# Patient Record
Sex: Male | Born: 1940 | Race: White | Hispanic: No | Marital: Married | Smoking: Former smoker
Health system: Southern US, Community
[De-identification: ages and names within clinical notes are randomized; demographics above are authoritative.]

## PROBLEM LIST (undated history)

## (undated) DIAGNOSIS — J189 Pneumonia, unspecified organism: Secondary | ICD-10-CM

## (undated) DIAGNOSIS — C801 Malignant (primary) neoplasm, unspecified: Secondary | ICD-10-CM

## (undated) DIAGNOSIS — Z5111 Encounter for antineoplastic chemotherapy: Secondary | ICD-10-CM

## (undated) DIAGNOSIS — Z8619 Personal history of other infectious and parasitic diseases: Secondary | ICD-10-CM

## (undated) DIAGNOSIS — G4700 Insomnia, unspecified: Secondary | ICD-10-CM

## (undated) DIAGNOSIS — C4490 Unspecified malignant neoplasm of skin, unspecified: Secondary | ICD-10-CM

## (undated) DIAGNOSIS — D696 Thrombocytopenia, unspecified: Secondary | ICD-10-CM

## (undated) DIAGNOSIS — R0602 Shortness of breath: Secondary | ICD-10-CM

## (undated) DIAGNOSIS — R350 Frequency of micturition: Secondary | ICD-10-CM

## (undated) DIAGNOSIS — D649 Anemia, unspecified: Secondary | ICD-10-CM

## (undated) DIAGNOSIS — E7800 Pure hypercholesterolemia, unspecified: Secondary | ICD-10-CM

## (undated) DIAGNOSIS — C3490 Malignant neoplasm of unspecified part of unspecified bronchus or lung: Secondary | ICD-10-CM

## (undated) DIAGNOSIS — R51 Headache: Secondary | ICD-10-CM

## (undated) DIAGNOSIS — K219 Gastro-esophageal reflux disease without esophagitis: Secondary | ICD-10-CM

## (undated) DIAGNOSIS — M2550 Pain in unspecified joint: Secondary | ICD-10-CM

## (undated) DIAGNOSIS — N400 Enlarged prostate without lower urinary tract symptoms: Secondary | ICD-10-CM

## (undated) HISTORY — PX: TONSILLECTOMY: SUR1361

## (undated) HISTORY — PX: COLONOSCOPY W/ BIOPSIES AND POLYPECTOMY: SHX1376

## (undated) HISTORY — PX: OTHER SURGICAL HISTORY: SHX169

## (undated) HISTORY — PX: ADENOIDECTOMY: SUR15

## (undated) HISTORY — DX: Insomnia, unspecified: G47.00

## (undated) HISTORY — DX: Unspecified malignant neoplasm of skin, unspecified: C44.90

## (undated) HISTORY — DX: Malignant neoplasm of unspecified part of unspecified bronchus or lung: C34.90

## (undated) HISTORY — PX: TUMOR EXCISION: SHX421

---

## 1963-03-29 DIAGNOSIS — J189 Pneumonia, unspecified organism: Secondary | ICD-10-CM

## 1963-03-29 HISTORY — DX: Pneumonia, unspecified organism: J18.9

## 2000-10-13 ENCOUNTER — Encounter: Payer: Self-pay | Admitting: Internal Medicine

## 2000-10-13 ENCOUNTER — Ambulatory Visit (HOSPITAL_COMMUNITY): Admission: RE | Admit: 2000-10-13 | Discharge: 2000-10-13 | Payer: Self-pay | Admitting: Internal Medicine

## 2001-05-14 ENCOUNTER — Emergency Department (HOSPITAL_COMMUNITY): Admission: EM | Admit: 2001-05-14 | Discharge: 2001-05-14 | Payer: Self-pay | Admitting: *Deleted

## 2001-05-14 ENCOUNTER — Encounter: Payer: Self-pay | Admitting: *Deleted

## 2010-07-14 ENCOUNTER — Telehealth: Payer: Self-pay

## 2010-07-14 DIAGNOSIS — Z139 Encounter for screening, unspecified: Secondary | ICD-10-CM

## 2010-07-14 NOTE — Telephone Encounter (Signed)
Gastroenterology Pre-Procedure Form  Request Date: 07/14/2010    Requesting Physician: Renaye Rakers     PATIENT INFORMATION:  Terry Robles is a 69 y.o., male (DOB=[birth year]).  PROCEDURE: Procedure(s) requested: colonoscopy Procedure Reason: screening for colon cancer  PATIENT REVIEW QUESTIONS: The patient reports the following:   1. Diabetes Melitis: no 2. Joint replacements in the past 12 months: no 3. Major health problems in the past 3 months: no 4. Has an artificial valve or MVP:no 5. Has been advised in past to take antibiotics in advance of a procedure like teeth cleaning: no}    MEDICATIONS & ALLERGIES:    Patient reports the following regarding taking any blood thinners:   Plavix? no Aspirin?Yes Coumadin?  no  Patient confirms/reports the following medications:  Current Outpatient Prescriptions  Medication Sig Dispense Refill  . nitroGLYCERIN (NITROSTAT) 0.4 MG SL tablet Place 0.4 mg under the tongue every 5 (five) minutes as needed. Pt said he has not had to use it yet       . pravastatin (PRAVACHOL) 40 MG tablet Take 40 mg by mouth daily.          Patient confirms/reports the following allergies:  No Known Allergies  Patient is appropriate to schedule for requested procedure(s): yes  AUTHORIZATION INFORMATION Primary Insurance: Medicare  ID #: [id number]       No orders of the defined types were placed in this encounter.    SCHEDULE INFORMATION: Procedure has been scheduled as follows:  Date: 08/03/2010   Time: 8:30 AM  Location: [facility] SHORT STAY  This Gastroenterology Pre-Precedure Form is being routed to the following provider(s) for review: Lorenza Burton

## 2010-07-19 NOTE — Telephone Encounter (Signed)
Ok for colonoscopy.  ?

## 2010-07-23 ENCOUNTER — Encounter: Payer: Self-pay | Admitting: Internal Medicine

## 2010-08-03 ENCOUNTER — Ambulatory Visit (HOSPITAL_COMMUNITY)
Admission: RE | Admit: 2010-08-03 | Discharge: 2010-08-03 | Disposition: A | Discharge status: Home / Self Care | Payer: Medicare Other | Source: Ambulatory Visit | Attending: Internal Medicine | Admitting: Internal Medicine

## 2010-08-03 ENCOUNTER — Other Ambulatory Visit: Payer: Self-pay | Admitting: Internal Medicine

## 2010-08-03 ENCOUNTER — Encounter: Payer: Self-pay | Admitting: Internal Medicine

## 2010-08-03 DIAGNOSIS — D126 Benign neoplasm of colon, unspecified: Secondary | ICD-10-CM | POA: Insufficient documentation

## 2010-08-03 DIAGNOSIS — K5730 Diverticulosis of large intestine without perforation or abscess without bleeding: Secondary | ICD-10-CM | POA: Insufficient documentation

## 2010-08-03 DIAGNOSIS — K5732 Diverticulitis of large intestine without perforation or abscess without bleeding: Secondary | ICD-10-CM

## 2010-08-03 DIAGNOSIS — Z7982 Long term (current) use of aspirin: Secondary | ICD-10-CM | POA: Insufficient documentation

## 2010-08-03 DIAGNOSIS — Z1211 Encounter for screening for malignant neoplasm of colon: Secondary | ICD-10-CM | POA: Insufficient documentation

## 2010-08-06 NOTE — Op Note (Signed)
  NAME:  Terry Robles, Terry Robles                  ACCOUNT NO.:  [id number]  MEDICAL RECORD NO.:  [id number]           PATIENT TYPE:  O  LOCATION:  [facility]YP                          FACILITY:  [facility]  PHYSICIAN:  R. Roetta Sessions, M.D. DATE OF BIRTH:  [birth year]  DATE OF PROCEDURE:  08/03/2010 DATE OF DISCHARGE:                              OPERATIVE REPORT   Colonoscopy with biopsy, snare polypectomy.  INDICATIONS FOR PROCEDURE:  The patient is a 70 year old gentleman with no lower GI tract symptoms sent over at the courtesy of Dr. Mirna Mires for his first ever screening colonoscopy.  No family history of polyps or colon cancer.  Colonoscopy is now being done as standard screening maneuver.  Risks, benefits, limitations, alternatives, and imponderables have been discussed, questions answered.  Please see documentation in the medical record.  PROCEDURE NOTE:  O2 saturation, blood pressure, pulse, and respirations monitored throughout the entire procedure.  CONSCIOUS SEDATION:  Versed 4 mg IV, Demerol 75 mg IV in divided doses.  Pentax video chip system findings.  Digital rectal exam revealed no abnormalities.  Endoscopic findings, prep was adequate.  Colon:  Colonic mucosa was surveyed from the rectosigmoid junction through the left transverse, right colon to the appendiceal orifice, ileocecal valve/cecum.  These structures were well seen and photographed for the record.  From this level, the scope was slowly and cautiously withdrawn.  All previously mentioned mucosal surfaces were again seen.  The patient was noted to have a diminutive polyp at the base of cecum which was cold biopsied. At hepatic flexure, there was a 5-mm polyp which was cold snared.  There is a second diminutive polyp at hepatic flexure which was cold biopsied as well.  The patient had early left-sided diverticula.  Remaining colonic mucosa appeared normal.  Scope was pulled down in the rectum with thorough examination of the  rectal mucosa including retroflexed view of the anal verge demonstrating no abnormalities.  The patient tolerated the procedure well.  Cecal withdrawal time 12 minutes.  IMPRESSION: 1. Normal rectum. 2. Early left-sided diverticula.  Multiple small polyps about the     hepatic flexure and cecum resected as described above.  RECOMMENDATIONS: 1. Polyp and diverticulosis literature provided to Mr. Pio 2. Follow up on path. 3. Further recommendations to follow.     Jonathon Bellows, M.D.     RMR/MEDQ  D:  08/03/2010  T:  08/03/2010  Job:  [id number]  cc:   Annia Friendly. Loleta Chance, MD Fax: [phone]  Electronically Signed by Lorrin Goodell M.D. on 08/06/2010 08:20:15 AM

## 2011-05-14 ENCOUNTER — Observation Stay (HOSPITAL_COMMUNITY)
Admission: EM | Admit: 2011-05-14 | Discharge: 2011-05-15 | Disposition: A | Discharge status: Home / Self Care | Payer: Medicare Other | Attending: Internal Medicine | Admitting: Internal Medicine

## 2011-05-14 ENCOUNTER — Other Ambulatory Visit: Payer: Self-pay

## 2011-05-14 ENCOUNTER — Encounter (HOSPITAL_COMMUNITY): Payer: Self-pay | Admitting: Internal Medicine

## 2011-05-14 ENCOUNTER — Emergency Department (HOSPITAL_COMMUNITY): Payer: Medicare Other

## 2011-05-14 DIAGNOSIS — R0602 Shortness of breath: Secondary | ICD-10-CM | POA: Insufficient documentation

## 2011-05-14 DIAGNOSIS — R109 Unspecified abdominal pain: Secondary | ICD-10-CM | POA: Insufficient documentation

## 2011-05-14 DIAGNOSIS — R079 Chest pain, unspecified: Principal | ICD-10-CM | POA: Diagnosis present

## 2011-05-14 DIAGNOSIS — J9819 Other pulmonary collapse: Secondary | ICD-10-CM | POA: Insufficient documentation

## 2011-05-14 DIAGNOSIS — R110 Nausea: Secondary | ICD-10-CM | POA: Insufficient documentation

## 2011-05-14 DIAGNOSIS — Z23 Encounter for immunization: Secondary | ICD-10-CM | POA: Insufficient documentation

## 2011-05-14 DIAGNOSIS — E7800 Pure hypercholesterolemia, unspecified: Secondary | ICD-10-CM | POA: Diagnosis present

## 2011-05-14 DIAGNOSIS — N400 Enlarged prostate without lower urinary tract symptoms: Secondary | ICD-10-CM

## 2011-05-14 HISTORY — DX: Pure hypercholesterolemia, unspecified: E78.00

## 2011-05-14 HISTORY — DX: Benign prostatic hyperplasia without lower urinary tract symptoms: N40.0

## 2011-05-14 LAB — DIFFERENTIAL
Basophils Absolute: 0 10*3/uL (ref 0.0–0.1)
Basophils Relative: 0 % (ref 0–1)
Neutro Abs: 5.9 10*3/uL (ref 1.7–7.7)
Neutrophils Relative %: 69 % (ref 43–77)

## 2011-05-14 LAB — CARDIAC PANEL(CRET KIN+CKTOT+MB+TROPI)
CK, MB: 3.2 ng/mL (ref 0.3–4.0)
Relative Index: INVALID (ref 0.0–2.5)
Relative Index: INVALID (ref 0.0–2.5)
Total CK: 96 U/L (ref 7–232)
Total CK: 79 U/L (ref 7–232)
Total CK: 70 U/L (ref 7–232)
Troponin I: <0.3 ng/mL (ref <0.30)

## 2011-05-14 LAB — PRO B NATRIURETIC PEPTIDE: Pro B Natriuretic peptide (BNP): 52.3 pg/mL (ref 0–125)

## 2011-05-14 LAB — COMPREHENSIVE METABOLIC PANEL
ALT: 29 U/L (ref 0–53)
AST: 21 U/L (ref 0–37)
Albumin: 3.6 g/dL (ref 3.5–5.2)
Alkaline Phosphatase: 78 U/L (ref 39–117)
Chloride: 101 mEq/L (ref 96–112)
Potassium: 3.7 mEq/L (ref 3.5–5.1)
Total Bilirubin: 0.3 mg/dL (ref 0.3–1.2)

## 2011-05-14 LAB — CBC
Hemoglobin: 14.1 g/dL (ref 13.0–17.0)
MCHC: 33.3 g/dL (ref 30.0–36.0)
RDW: 13.6 % (ref 11.5–15.5)
WBC: 8.6 10*3/uL (ref 4.0–10.5)

## 2011-05-14 LAB — LIPID PANEL
LDL Cholesterol: 83 mg/dL (ref 0–99)
Total CHOL/HDL Ratio: 3.3 RATIO

## 2011-05-14 MED ORDER — ENOXAPARIN SODIUM 40 MG/0.4ML ~~LOC~~ SOLN
40.0000 mg | Freq: Every day | SUBCUTANEOUS | Status: DC
  Administered 2011-05-14: 40 mg via SUBCUTANEOUS
  Filled 2011-05-14: qty 0.4

## 2011-05-14 MED ORDER — PANTOPRAZOLE SODIUM 40 MG PO TBEC
40.0000 mg | DELAYED_RELEASE_TABLET | Freq: Two times a day (BID) | ORAL | Status: DC
  Administered 2011-05-14 – 2011-05-15 (×3): 40 mg via ORAL
  Filled 2011-05-14 (×3): qty 1

## 2011-05-14 MED ORDER — ALUM & MAG HYDROXIDE-SIMETH 200-200-20 MG/5ML PO SUSP: 30.0000 mL | Freq: Four times a day (QID) | ORAL | Status: DC | PRN

## 2011-05-14 MED ORDER — ACETAMINOPHEN 325 MG PO TABS
650.0000 mg | ORAL_TABLET | Freq: Four times a day (QID) | ORAL | Status: DC | PRN
  Administered 2011-05-15: 650 mg via ORAL
  Filled 2011-05-14: qty 2

## 2011-05-14 MED ORDER — INFLUENZA VIRUS VACC SPLIT PF IM SUSP
0.5000 mL | INTRAMUSCULAR | Status: AC
  Administered 2011-05-15: 0.5 mL via INTRAMUSCULAR
  Filled 2011-05-14: qty 0.5

## 2011-05-14 MED ORDER — ASPIRIN 81 MG PO CHEW
81.0000 mg | CHEWABLE_TABLET | Freq: Every day | ORAL | Status: DC
  Administered 2011-05-14: 81 mg via ORAL
  Filled 2011-05-14: qty 1

## 2011-05-14 MED ORDER — ONDANSETRON HCL 4 MG/2ML IJ SOLN: 4.0000 mg | Freq: Four times a day (QID) | INTRAMUSCULAR | Status: DC | PRN

## 2011-05-14 MED ORDER — SODIUM CHLORIDE 0.9 % IV SOLN
Freq: Once | INTRAVENOUS | Status: AC
  Administered 2011-05-14: 20 mL/h via INTRAVENOUS

## 2011-05-14 MED ORDER — MORPHINE SULFATE 2 MG/ML IJ SOLN: 2.0000 mg | INTRAMUSCULAR | Status: DC | PRN

## 2011-05-14 MED ORDER — SODIUM CHLORIDE 0.9 % IJ SOLN: 3.0000 mL | Freq: Two times a day (BID) | INTRAMUSCULAR | Status: DC

## 2011-05-14 MED ORDER — MORPHINE SULFATE 4 MG/ML IJ SOLN
4.0000 mg | Freq: Once | INTRAMUSCULAR | Status: AC
  Administered 2011-05-14: 4 mg via INTRAVENOUS
  Filled 2011-05-14: qty 1

## 2011-05-14 MED ORDER — SODIUM CHLORIDE 0.9 % IJ SOLN
3.0000 mL | Freq: Two times a day (BID) | INTRAMUSCULAR | Status: DC
  Administered 2011-05-14 (×2): 3 mL via INTRAVENOUS
  Filled 2011-05-14 (×2): qty 3

## 2011-05-14 MED ORDER — ALBUTEROL SULFATE (5 MG/ML) 0.5% IN NEBU: 2.5000 mg | INHALATION_SOLUTION | RESPIRATORY_TRACT | Status: DC | PRN

## 2011-05-14 MED ORDER — TAMSULOSIN HCL 0.4 MG PO CAPS
0.4000 mg | ORAL_CAPSULE | Freq: Every day | ORAL | Status: DC
  Administered 2011-05-14: 0.4 mg via ORAL
  Filled 2011-05-14: qty 1

## 2011-05-14 MED ORDER — ASPIRIN 81 MG PO CHEW
324.0000 mg | CHEWABLE_TABLET | Freq: Once | ORAL | Status: AC
  Administered 2011-05-14: 324 mg via ORAL
  Filled 2011-05-14: qty 4

## 2011-05-14 MED ORDER — ACETAMINOPHEN 650 MG RE SUPP: 650.0000 mg | Freq: Four times a day (QID) | RECTAL | Status: DC | PRN

## 2011-05-14 MED ORDER — SIMVASTATIN 20 MG PO TABS
20.0000 mg | ORAL_TABLET | Freq: Every day | ORAL | Status: DC
  Administered 2011-05-14: 20 mg via ORAL
  Filled 2011-05-14: qty 1

## 2011-05-14 MED ORDER — PNEUMOCOCCAL VAC POLYVALENT 25 MCG/0.5ML IJ INJ
0.5000 mL | INJECTION | INTRAMUSCULAR | Status: AC
  Administered 2011-05-15: 0.5 mL via INTRAMUSCULAR
  Filled 2011-05-14: qty 0.5

## 2011-05-14 MED ORDER — ONDANSETRON HCL 4 MG/2ML IJ SOLN
4.0000 mg | Freq: Once | INTRAMUSCULAR | Status: AC
  Administered 2011-05-14: 4 mg via INTRAVENOUS
  Filled 2011-05-14: qty 2

## 2011-05-14 MED ORDER — ONDANSETRON HCL 4 MG PO TABS: 4.0000 mg | ORAL_TABLET | Freq: Four times a day (QID) | ORAL | Status: DC | PRN

## 2011-05-14 NOTE — ED Notes (Signed)
IV site at left [facility] painful per patient - assessed - infiltrated. Removed. New site started left hand.

## 2011-05-14 NOTE — ED Notes (Addendum)
Awoke from sleep with pain in center of chest and into his back, also some radiation to his abdomen   Thought he may have eaten something that "did not set well.  Note: went immediately to receive patient upon arrival - wife stated he is in the bathroom

## 2011-05-14 NOTE — Progress Notes (Signed)
Subjective: This man was admitted with central pressure-like chest pain which started at 2:30 AM today. It lasted for 3 hours. It was associated with nausea and dyspnea but no sweating. There were no ECG changes and first troponin level is negative. His pain is now resolved.           Physical Exam: Blood pressure 128/74, pulse 68, temperature 98 F (36.7 C), temperature source Oral, resp. rate 18, height 6' 1.5" (1.867 m), weight 108.7 kg (239 lb 10.2 oz), SpO2 97.00%. He looks systemically well. Heart sounds are present and normal without pericardial rub. Lung fields are clear without pleural rub. Abdomen is soft and nontender. He is alert and orientated without any focal signs.   Investigations:     Basic Metabolic Panel:  Basename 05/14/11 0500  NA 140  K 3.7  CL 101  CO2 31  GLUCOSE 126*  BUN 13  CREATININE 0.95  CALCIUM 9.7  MG --  PHOS --   Liver Function Tests:  Basename 05/14/11 0500  AST 21  ALT 29  ALKPHOS 78  BILITOT 0.3  PROT 6.9  ALBUMIN 3.6     CBC:  Basename 05/14/11 0500  WBC 8.6  NEUTROABS 5.9  HGB 14.1  HCT 42.3  MCV 96.8  PLT 244    Dg Chest Port 1 View  05/14/2011  *RADIOLOGY REPORT*  Clinical Data: Chest pain.  History of smoking.  PORTABLE CHEST - 1 VIEW  Comparison: None.  Findings: The lungs are well-aerated.  Mild bilateral atelectasis is noted; the lungs are otherwise clear.  There is no evidence of pleural effusion or pneumothorax.  The cardiomediastinal silhouette is borderline enlarged.  No acute osseous abnormalities are seen.  IMPRESSION: Mild bilateral atelectasis noted; lungs otherwise clear. Borderline cardiomegaly.  Original Report Authenticated By: Tonia Ghent, M.D.      Medications: I have reviewed the patient's current medications.  Impression: 1. Chest pain, unclear etiology. 2. Hyperlipidemia.      Plan: 1. Await serial cardiac enzymes. Continue with current treatment. Possible discharge home  tomorrow. He may need a repeat stress test as an outpatient.     LOS: 0 days   Wilson Singer Pager [phone]  05/14/2011, 12:10 PM

## 2011-05-14 NOTE — ED Notes (Signed)
MD at bedside. Terry Robles, in to examine patient

## 2011-05-14 NOTE — H&P (Signed)
Terry Robles is an 70 y.o. male.    PCP: Evlyn Courier, MD, MD   Chief Complaint: Chest pain  HPI: This is a 71 year old, Caucasian male, with a past medical history of hyperlipidemia, benign prostatic hypertrophy, who was in his usual state of health about 2:30 this morning when he was asleep. He woke up from sleep with the feelings of pain and discomfort in his chest area. It was in the retrosternal area. Was a pressure-like sensation. There was no radiation of the pain. He also felt pain in his upper abdomen. At that time pain was 7/10 in intensity. It was associated with some shortness of breath and nausea. He tried to vomit, but he could not. Denies any diaphoresis or palpitations. He tried taking Tums, followed by baking soda, followed by aspirin, followed by 3 tablets of nitroglycerin with no relief. So he decided to come in to the hospital. He was given morphine and some nausea medication with, which the pain has improved. Still has about has about 4-5/10 pain in intensity. Denies any dizziness or lightheadedness. Denies any significant problems with heartburn or acid reflux.  He tells me, that he had chest pain last year, which radiates to his left arm. He went to his doctor's office and he was referred to a cardiologist. He underwent a stress test done by [facility] and vascular, which was reported as negative to the patient.   Home Medications: Prior to Admission medications   Medication Sig Start Date End Date Taking? Authorizing Provider  aspirin 81 MG chewable tablet Chew 81 mg by mouth daily.   Yes Historical Provider, MD  nitroGLYCERIN (NITROSTAT) 0.4 MG SL tablet Place 0.4 mg under the tongue every 5 (five) minutes as needed. Pt said he has not had to use it yet    Yes Historical Provider, MD  pravastatin (PRAVACHOL) 40 MG tablet Take 40 mg by mouth daily.     Yes Historical Provider, MD  Tamsulosin HCl (FLOMAX) 0.4 MG CAPS Take 0.4 mg by mouth daily after supper.   Yes  Historical Provider, MD    Allergies: No Known Allergies  Past Medical History: Past Medical History  Diagnosis Date  . Hypercholesteremia 05/14/2011  . BPH (benign prostatic hyperplasia) 05/14/2011    Past Surgical History  Procedure Date  . Tonsillectomy     Social History:  reports that he has never smoked. He does not have any smokeless tobacco history on file. He reports that he drinks alcohol. He reports that he does not use illicit drugs.  Family History:  Family History  Problem Relation Age of Onset  . COPD Mother     Review of Systems - History obtained from the patient General ROS: negative Psychological ROS: negative Ophthalmic ROS: negative ENT ROS: negative Allergy and Immunology ROS: negative Hematological and Lymphatic ROS: negative Endocrine ROS: negative Respiratory ROS: as in hpi Cardiovascular ROS: as in hpi Gastrointestinal ROS: as in hpi Genito-Urinary ROS: negative Musculoskeletal ROS: negative Neurological ROS: negative Dermatological ROS: negative  Physical Examination Blood pressure 120/57, pulse 62, temperature 97.8 F (36.6 C), resp. rate 14, height 6' 1.5" (1.867 m), weight 107.956 kg (238 lb), SpO2 100.00%.  General appearance: alert, cooperative, appears stated age and no distress Head: Normocephalic, without obvious abnormality, atraumatic Eyes: conjunctivae/corneas clear. PERRL, EOM's intact. Throat: lips, mucosa, and tongue normal; teeth and gums normal Neck: no adenopathy, no carotid bruit, no JVD, supple, symmetrical, trachea midline and thyroid not enlarged, symmetric, no tenderness/mass/nodules Back: symmetric,  no curvature. ROM normal. No CVA tenderness. Resp: clear to auscultation bilaterally Cardio: regular rate and rhythm, S1, S2 normal, no murmur, click, rub or gallop GI: soft, non-tender; bowel sounds normal; no masses,  no organomegaly Extremities: extremities normal, atraumatic, no cyanosis or edema Pulses: 2+ and  symmetric Skin: Skin color, texture, turgor normal. No rashes or lesions Lymph nodes: Cervical, supraclavicular, and axillary nodes normal. Neurologic: Grossly normal  Laboratory Data: Results for orders placed during the hospital encounter of 05/14/11 (from the past 48 hour(s))  CBC     Status: Normal   Collection Time   05/14/11  5:00 AM      Component Value Range Comment   WBC 8.6  4.0 - 10.5 (K/uL)    RBC 4.37  4.22 - 5.81 (MIL/uL)    Hemoglobin 14.1  13.0 - 17.0 (g/dL)    HCT 96.0  45.4 - 09.8 (%)    MCV 96.8  78.0 - 100.0 (fL)    MCH 32.3  26.0 - 34.0 (pg)    MCHC 33.3  30.0 - 36.0 (g/dL)    RDW 11.9  14.7 - 82.9 (%)    Platelets 244  150 - 400 (K/uL)   DIFFERENTIAL     Status: Normal   Collection Time   05/14/11  5:00 AM      Component Value Range Comment   Neutrophils Relative 69  43 - 77 (%)    Neutro Abs 5.9  1.7 - 7.7 (K/uL)    Lymphocytes Relative 21  12 - 46 (%)    Lymphs Abs 1.8  0.7 - 4.0 (K/uL)    Monocytes Relative 8  3 - 12 (%)    Monocytes Absolute 0.7  0.1 - 1.0 (K/uL)    Eosinophils Relative 3  0 - 5 (%)    Eosinophils Absolute 0.2  0.0 - 0.7 (K/uL)    Basophils Relative 0  0 - 1 (%)    Basophils Absolute 0.0  0.0 - 0.1 (K/uL)   COMPREHENSIVE METABOLIC PANEL     Status: Abnormal   Collection Time   05/14/11  5:00 AM      Component Value Range Comment   Sodium 140  135 - 145 (mEq/L)    Potassium 3.7  3.5 - 5.1 (mEq/L)    Chloride 101  96 - 112 (mEq/L)    CO2 31  19 - 32 (mEq/L)    Glucose, Bld 126 (*) 70 - 99 (mg/dL)    BUN 13  6 - 23 (mg/dL)    Creatinine, Ser 5.62  0.50 - 1.35 (mg/dL)    Calcium 9.7  8.4 - 10.5 (mg/dL)    Total Protein 6.9  6.0 - 8.3 (g/dL)    Albumin 3.6  3.5 - 5.2 (g/dL)    AST 21  0 - 37 (U/L)    ALT 29  0 - 53 (U/L)    Alkaline Phosphatase 78  39 - 117 (U/L)    Total Bilirubin 0.3  0.3 - 1.2 (mg/dL)    GFR calc non Af Amer 82 (*) >90 (mL/min)    GFR calc Af Amer >90  >90 (mL/min)   LIPASE, BLOOD     Status: Normal    Collection Time   05/14/11  5:00 AM      Component Value Range Comment   Lipase 40  11 - 59 (U/L)   POCT I-STAT TROPONIN I     Status: Normal   Collection Time   05/14/11  5:03 AM  Component Value Range Comment   Troponin i, poc 0.00  0.00 - 0.08 (ng/mL)    Comment 3              Radiology Reports: Dg Chest Port 1 View  05/14/2011  *RADIOLOGY REPORT*  Clinical Data: Chest pain.  History of smoking.  PORTABLE CHEST - 1 VIEW  Comparison: None.  Findings: The lungs are well-aerated.  Mild bilateral atelectasis is noted; the lungs are otherwise clear.  There is no evidence of pleural effusion or pneumothorax.  The cardiomediastinal silhouette is borderline enlarged.  No acute osseous abnormalities are seen.  IMPRESSION: Mild bilateral atelectasis noted; lungs otherwise clear. Borderline cardiomegaly.  Original Report Authenticated By: Tonia Ghent, M.D.    Electrocardiogram: Shows a sinus bradycardia at 55 beats per minute with normal axis. Intervals appear to be in the normal range. No Q waves. No concerning ST or T-wave changes are noted.  Assessment/Plan  Principal Problem:  *Chest pain at rest Active Problems:  Hypercholesteremia   #1 chest pain at rest: Differential diagnoses include coronary artery disease, GERD, biliary process. Patient will be observed in the hospital and ruled out for acute coronary syndrome. His risk factors include hypercholesterolemia. However, his EKG is unremarkable. He'll be given PPI twice a day for now. Liver function test will be checked again tomorrow. Of note, his lipase is normal. EKG will be repeated in the morning. Aspirin will be given.  #2 hypercholesterolemia: Continue with statin. Lipid panel will be checked.  DVT, prophylaxis will be provided. He is a full code.  Further management decisions will depend on results of further testing and patient's response to treatment.  [facility]  [facility] Pager  [phone]  05/14/2011, 6:29 AM

## 2011-05-14 NOTE — Plan of Care (Signed)
Problem: Phase II Progression Outcomes Goal: Anginal pain relieved Outcome: Completed/Met Date Met:  05/14/11 Pt is not complaining of any chest pain at this time.

## 2011-05-14 NOTE — ED Notes (Signed)
Took NTG X 3 prior to arrival with no change in pain.  Also took one baby aspirin and chewed some Tums.  No relief with any of these medications.

## 2011-05-14 NOTE — ED Provider Notes (Signed)
History     CSN: [id number]  Arrival date & time 05/14/11  7829   First MD Initiated Contact with Patient 05/14/11 [phone]      Chief Complaint  Patient presents with  . Chest Pain    Chest pain     Patient is a 71 y.o. male presenting with chest pain. The history is provided by the patient.  Chest Pain Episode onset: just prior to arrival. Chest pain occurs constantly. The chest pain is unchanged. The severity of the pain is moderate. The quality of the pain is described as pressure-like. The pain radiates to the upper back. Exacerbated by: nothing. Primary symptoms include shortness of breath, abdominal pain and nausea. Pertinent negatives for primary symptoms include no fever, no cough, no vomiting and no dizziness.  Pertinent negatives for associated symptoms include no diaphoresis and no near-syncope. He tried nitroglycerin for the symptoms.   Pt reports he also has some upper abd pain as well.  He reports it feels somewhat like "reflux" He reports evaluation by cardiologist over 6 months ago for an episode of CP and he reports he had "normal stress test"  He took a NTG at home that his physician had given him previously.  No significant relief from NTG   PMH - none  No past surgical history on file.  No family history on file.  History  Substance Use Topics  . Smoking status: Not on file  . Smokeless tobacco: Not on file  . Alcohol Use: Not on file      Review of Systems  Constitutional: Negative for fever and diaphoresis.  Respiratory: Positive for shortness of breath. Negative for cough.   Cardiovascular: Positive for chest pain. Negative for near-syncope.  Gastrointestinal: Positive for nausea and abdominal pain. Negative for vomiting.  Neurological: Negative for dizziness.  All other systems reviewed and are negative.    Allergies  Review of patient's allergies indicates no known allergies.  Home Medications   Current Outpatient Rx  Name Route Sig  Dispense Refill  . ASPIRIN 81 MG PO CHEW Oral Chew 81 mg by mouth daily.    Marland Kitchen NITROGLYCERIN 0.4 MG SL SUBL Sublingual Place 0.4 mg under the tongue every 5 (five) minutes as needed. Pt said he has not had to use it yet     . PRAVASTATIN SODIUM 40 MG PO TABS Oral Take 40 mg by mouth daily.        BP 138/60  Pulse 55  Temp 97.8 F (36.6 C)  Resp 20  Ht 6' 1.5" (1.867 m)  Wt 238 lb (107.956 kg)  BMI 30.97 kg/m2  SpO2 100%  Physical Exam CONSTITUTIONAL: Well developed/well nourished HEAD AND FACE: Normocephalic/atraumatic EYES: EOMI/PERRL ENMT: Mucous membranes moist NECK: supple no meningeal signs SPINE:entire spine nontender CV: S1/S2 noted, no murmurs/rubs/gallops noted LUNGS: Lungs are clear to auscultation bilaterally, no apparent distress ABDOMEN: soft, tender in epigastric and RUQ region, tenderness is mild.  No rebound/guarding is noted GU:no cva tenderness NEURO: Pt is awake/alert, moves all extremitiesx4 EXTREMITIES: pulses normal, full ROM, no edema SKIN: warm, color normal PSYCH: no abnormalities of mood noted  ED Course  Procedures    Labs Reviewed  CBC  DIFFERENTIAL  COMPREHENSIVE METABOLIC PANEL  LIPASE, BLOOD   5:15 AM Pt with some abd tenderness associated with this CP Biliary pathology is possible  5:50 AM On reassessment, pt is improved, no distress abd is soft on re-exam Given his age, and the possibility of ACS, will admit to  hospital D/w dr Rito Ehrlich, to see patient     MDM  Nursing notes reviewed and considered in documentation Previous records reviewed and considered All labs/vitals reviewed and considered xrays reviewed and considered        Date: 05/14/2011  Rate: 55  Rhythm: sinus bradycardia  QRS Axis: normal  Intervals: normal  ST/T Wave abnormalities: normal  Conduction Disutrbances:none  Narrative Interpretation:   Old EKG Reviewed: none available at time of interpretation    Joya Gaskins, MD 05/14/11 0602

## 2011-05-15 LAB — COMPREHENSIVE METABOLIC PANEL
AST: 38 U/L — ABNORMAL HIGH (ref 0–37)
Albumin: 3.1 g/dL — ABNORMAL LOW (ref 3.5–5.2)
BUN: 13 mg/dL (ref 6–23)
Chloride: 102 mEq/L (ref 96–112)
Creatinine, Ser: 0.97 mg/dL (ref 0.50–1.35)
Total Bilirubin: 1.4 mg/dL — ABNORMAL HIGH (ref 0.3–1.2)
Total Protein: 6.4 g/dL (ref 6.0–8.3)

## 2011-05-15 LAB — CBC
HCT: 39.5 % (ref 39.0–52.0)
Hemoglobin: 13.3 g/dL (ref 13.0–17.0)
MCH: 32.5 pg (ref 26.0–34.0)
MCV: 96.6 fL (ref 78.0–100.0)
Platelets: 223 10*3/uL (ref 150–400)
RBC: 4.09 MIL/uL — ABNORMAL LOW (ref 4.22–5.81)
WBC: 11.3 10*3/uL — ABNORMAL HIGH (ref 4.0–10.5)

## 2011-05-15 NOTE — Discharge Summary (Signed)
Physician Discharge Summary  Patient ID: Terry Robles MRN: [id number] DOB/AGE: 07/03/1940 71 y.o. Primary Care Physician:HILL,GERALD K, MD, MD Admit date: 05/14/2011 Discharge date: 05/15/2011    Discharge Diagnoses:  1. Chest pain, unclear etiology. No evidence of myocardial ischemia or infarction. 2. Hypercholesterolemia. Controlled with statins.   Medication List  As of 05/15/2011  7:58 AM   TAKE these medications         aspirin 81 MG chewable tablet   Chew 81 mg by mouth daily.      cycloSPORINE 0.05 % ophthalmic emulsion   Commonly known as: RESTASIS   Place 1 drop into both eyes 2 (two) times daily.      mulitivitamin with minerals Tabs   Take 1 tablet by mouth daily.      nitroGLYCERIN 0.4 MG SL tablet   Commonly known as: NITROSTAT   Place 0.4 mg under the tongue every 5 (five) minutes as needed. Pt said he has not had to use it yet      pravastatin 40 MG tablet   Commonly known as: PRAVACHOL   Take 40 mg by mouth daily.      Tamsulosin HCl 0.4 MG Caps   Commonly known as: FLOMAX   Take 0.4 mg by mouth daily after supper.            Discharged Condition: Stable and improved.    Consults: None.  Significant Diagnostic Studies: Dg Chest Port 1 View  05/14/2011  *RADIOLOGY REPORT*  Clinical Data: Chest pain.  History of smoking.  PORTABLE CHEST - 1 VIEW  Comparison: None.  Findings: The lungs are well-aerated.  Mild bilateral atelectasis is noted; the lungs are otherwise clear.  There is no evidence of pleural effusion or pneumothorax.  The cardiomediastinal silhouette is borderline enlarged.  No acute osseous abnormalities are seen.  IMPRESSION: Mild bilateral atelectasis noted; lungs otherwise clear. Borderline cardiomegaly.  Original Report Authenticated By: Tonia Ghent, M.D.    Lab Results: Basic Metabolic Panel:  Basename 05/15/11 0630 05/14/11 0500  NA 135 140  K 3.7 3.7  CL 102 101  CO2 26 31  GLUCOSE 122* 126*  BUN 13 13  CREATININE 0.97  0.95  CALCIUM 8.9 9.7  MG -- --  PHOS -- --   Liver Function Tests:  Basename 05/15/11 0630 05/14/11 0500  AST 38* 21  ALT 46 29  ALKPHOS 71 78  BILITOT 1.4* 0.3  PROT 6.4 6.9  ALBUMIN 3.1* 3.6     CBC:  Basename 05/15/11 0630 05/14/11 0500  WBC 11.3* 8.6  NEUTROABS -- 5.9  HGB 13.3 14.1  HCT 39.5 42.3  MCV 96.6 96.8  PLT 223 244       Hospital Course: This very pleasant 71 year old man presented with chest pain which was convincing for possible cardiac pain. It was central pressure-like chest pain that lasted approximately 3 hours. It was not associated with nausea and dyspnea but no sweating. There were no ECG or cardiac enzyme changes. He has been observed for the last 24 hours and his pain is completely gone and has never recurred again. He did have a stress test approximately a year ago. This apparently was unremarkable.  Discharge Exam: Blood pressure 99/58, pulse 55, temperature 98.4 F (36.9 C), temperature source Oral, resp. rate 20, height 6' 1.5" (1.867 m), weight 108.7 kg (239 lb 10.2 oz), SpO2 97.00%. He looks systemically well. Is not clinically shock despite slightly low blood pressure this morning. Heart sounds are present and  normal with no evidence of pericardial rub. Lung fields are clear with no evidence of pleural rub. Abdomen is soft and nontender. He is alert and orientated without any focal neurological signs.  Disposition: Home. I think he may benefit from an outpatient stress test. His cardiologist was Dr. Lynnea Ferrier of [facility]. I've asked him to go and see his primary care physician first and then be referred if Dr. Loleta Chance feels it's appropriate.  Discharge Orders    Future Orders Please Complete By Expires   Diet - low sodium heart healthy      Increase activity slowly         Follow-up Information    Follow up with [facility] K, MD. Schedule an appointment as soon as possible for a visit in 1 week.          SignedWilson Singer Pager [phone]  05/15/2011, 7:58 AM

## 2011-05-15 NOTE — Progress Notes (Signed)
Note for 2330-. Dr. Rito Ehrlich notified at 2330 for BP of 117/55 at 2131, and 105/58 at 2310. MD notified due to order to do so if diastolic pressure falls below 60. Pt is stable, resting and comfortable. No orders received. Sheryn Bison

## 2013-04-10 ENCOUNTER — Other Ambulatory Visit (HOSPITAL_COMMUNITY): Payer: Self-pay | Admitting: Family Medicine

## 2013-04-10 DIAGNOSIS — R053 Chronic cough: Secondary | ICD-10-CM

## 2013-04-10 DIAGNOSIS — R05 Cough: Secondary | ICD-10-CM

## 2013-04-12 ENCOUNTER — Ambulatory Visit (HOSPITAL_COMMUNITY)
Admission: RE | Admit: 2013-04-12 | Discharge: 2013-04-12 | Disposition: A | Discharge status: Home / Self Care | Payer: Medicare Other | Source: Ambulatory Visit | Attending: Family Medicine | Admitting: Family Medicine

## 2013-04-12 DIAGNOSIS — R042 Hemoptysis: Secondary | ICD-10-CM | POA: Insufficient documentation

## 2013-04-12 DIAGNOSIS — R05 Cough: Secondary | ICD-10-CM

## 2013-04-12 DIAGNOSIS — C3490 Malignant neoplasm of unspecified part of unspecified bronchus or lung: Secondary | ICD-10-CM | POA: Insufficient documentation

## 2013-04-12 DIAGNOSIS — R918 Other nonspecific abnormal finding of lung field: Secondary | ICD-10-CM | POA: Insufficient documentation

## 2013-04-12 DIAGNOSIS — R053 Chronic cough: Secondary | ICD-10-CM

## 2013-04-12 LAB — POCT I-STAT CREATININE: CREATININE: 1.1 mg/dL (ref 0.50–1.35)

## 2013-04-12 MED ORDER — SODIUM CHLORIDE 0.9 % IJ SOLN
INTRAMUSCULAR | Status: AC
  Filled 2013-04-12: qty 500

## 2013-04-12 MED ORDER — IOHEXOL 300 MG/ML  SOLN
100.0000 mL | Freq: Once | INTRAMUSCULAR | Status: AC | PRN
  Administered 2013-04-12: 100 mL via INTRAVENOUS

## 2013-04-16 ENCOUNTER — Encounter: Payer: Self-pay | Admitting: Internal Medicine

## 2013-04-16 ENCOUNTER — Encounter (INDEPENDENT_AMBULATORY_CARE_PROVIDER_SITE_OTHER): Payer: Self-pay

## 2013-04-16 ENCOUNTER — Ambulatory Visit (INDEPENDENT_AMBULATORY_CARE_PROVIDER_SITE_OTHER): Payer: Medicare Other | Admitting: Internal Medicine

## 2013-04-16 VITALS — BP 126/70 | HR 73 | Temp 97.3°F | Ht 73.5 in | Wt 223.2 lb

## 2013-04-16 DIAGNOSIS — R222 Localized swelling, mass and lump, trunk: Secondary | ICD-10-CM

## 2013-04-16 DIAGNOSIS — R918 Other nonspecific abnormal finding of lung field: Secondary | ICD-10-CM | POA: Insufficient documentation

## 2013-04-16 MED ORDER — ACETAMINOPHEN-CODEINE #3 300-30 MG PO TABS: ORAL_TABLET | ORAL | Status: DC

## 2013-04-16 NOTE — Patient Instructions (Signed)
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

## 2013-04-16 NOTE — Progress Notes (Signed)
   Subjective:    Patient ID: Terry Robles, male    DOB: [birth year] MRN: [id number]  HPI  89 yowm quit smoking 2000 new onset R back /shoulder pain x 6 months and hemoptysis 2nd week Jan 2015 referred 04/16/2013 by Dr French Ana for eval of hemoptysis   04/16/2013 1st [facility] office visit/ Wert cc indolent onset dull variable but persistent daily  R post shoulder cp x 6 m's then hemoptysis x 2 04/06/13 in setting of nagging dry cough x 2-3 months  - total  bld= 1 tsp on baby asa daily  Cough and wheeze worse on R side down  W/u c/w pancoast tumor  No obvious other patterns in day to day or daytime variabilty or assoc   chest tightness overt sinus or hb symptoms. No unusual exp hx or h/o childhood pna/ asthma or knowledge of premature birth.  Sleeping ok without nocturnal  or early am exacerbation  of respiratory  c/o's or need for noct saba. Also denies any obvious fluctuation of symptoms with weather or environmental changes or other aggravating or alleviating factors except as outlined above   Current Medications, Allergies, Complete Past Medical History, Past Surgical History, Family History, and Social History were reviewed in [employer] record.               Review of Systems  Constitutional: Positive for appetite change and unexpected weight change. Negative for fever, chills and activity change.  HENT: Positive for congestion. Negative for dental problem, postnasal drip, rhinorrhea, sneezing, sore throat, trouble swallowing and voice change.   Eyes: Negative for visual disturbance.  Respiratory: Positive for cough. Negative for choking and shortness of breath.   Cardiovascular: Positive for chest pain. Negative for leg swelling.  Gastrointestinal: Negative for nausea, vomiting and abdominal pain.  Genitourinary: Negative for difficulty urinating.  Musculoskeletal: Negative for arthralgias.  Skin: Negative for rash.  Psychiatric/Behavioral:  Negative for behavioral problems and confusion.       Objective:   Physical Exam  Wt Readings from Last 3 Encounters:  04/16/13 223 lb 3.2 oz (101.243 kg)  05/14/11 239 lb 10.2 oz (108.7 kg)     Pleasant amb wm nad  HEENT: nl dentition, turbinates, and orophanx. Nl external ear canals without cough reflex   NECK :  without JVD/Nodes/TM/ nl carotid upstrokes bilaterally   LUNGS: no acc muscle use, clear to A and P bilaterally without cough on insp or exp maneuvers   CV:  RRR  no s3 or murmur or increase in P2, no edema   ABD:  soft and nontender with nl excursion in the supine position. No bruits or organomegaly, bowel sounds nl  MS:  warm without deformities, calf tenderness, cyanosis or clubbing  SKIN: warm and dry without lesions    NEURO:  alert, approp, no deficits    .      Assessment & Plan:

## 2013-04-17 ENCOUNTER — Encounter: Payer: Self-pay | Admitting: Internal Medicine

## 2013-04-17 NOTE — Assessment & Plan Note (Addendum)
-   CT Chest 04/12/13 1. 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 Confluent right hilar and suprahilar  adenopathy.  2. Several left-sided pulmonary ill nodules, largest 7 mm in average  size, nonspecific for benign versus malignant nodules  This is clearly a sup sulcus tumor with rib involvement and localized symptoms only - typical of a sq cell lung ca > best option is CT bx by IR but needs pet first  Discussed in detail all the  indications, usual  risks and alternatives  relative to the benefits with patient who agrees to proceed with plan as outlined.  In meantime should hold asa if bleeding and use tylenol with codeine for cough or pain   See instructions for specific recommendations which were reviewed directly with the patient who was given a copy with highlighter outlining the key components.

## 2013-04-20 ENCOUNTER — Encounter: Payer: Self-pay | Admitting: Internal Medicine

## 2013-04-25 ENCOUNTER — Encounter (HOSPITAL_COMMUNITY): Payer: Self-pay

## 2013-04-25 ENCOUNTER — Other Ambulatory Visit: Payer: Self-pay | Admitting: Internal Medicine

## 2013-04-25 ENCOUNTER — Encounter (HOSPITAL_COMMUNITY)
Admission: RE | Admit: 2013-04-25 | Discharge: 2013-04-25 | Disposition: A | Discharge status: Home / Self Care | Payer: Medicare Other | Source: Ambulatory Visit | Attending: Internal Medicine | Admitting: Internal Medicine

## 2013-04-25 ENCOUNTER — Encounter: Payer: Self-pay | Admitting: Internal Medicine

## 2013-04-25 DIAGNOSIS — R918 Other nonspecific abnormal finding of lung field: Secondary | ICD-10-CM

## 2013-04-25 LAB — GLUCOSE, CAPILLARY: Glucose-Capillary: 104 mg/dL — ABNORMAL HIGH (ref 70–99)

## 2013-04-25 MED ORDER — FLUDEOXYGLUCOSE F - 18 (FDG) INJECTION
18.6000 | Freq: Once | INTRAVENOUS | Status: AC | PRN
  Administered 2013-04-25: 18.6 via INTRAVENOUS

## 2013-04-29 ENCOUNTER — Other Ambulatory Visit: Payer: Self-pay | Admitting: Radiology

## 2013-04-30 ENCOUNTER — Encounter (HOSPITAL_COMMUNITY): Payer: Self-pay

## 2013-04-30 ENCOUNTER — Other Ambulatory Visit: Payer: Self-pay | Admitting: Internal Medicine

## 2013-04-30 ENCOUNTER — Ambulatory Visit (HOSPITAL_COMMUNITY)
Admission: RE | Admit: 2013-04-30 | Discharge: 2013-04-30 | Disposition: A | Discharge status: Home / Self Care | Payer: Medicare Other | Source: Ambulatory Visit | Attending: Internal Medicine | Admitting: Internal Medicine

## 2013-04-30 ENCOUNTER — Ambulatory Visit (HOSPITAL_COMMUNITY)
Admission: RE | Admit: 2013-04-30 | Discharge: 2013-04-30 | Disposition: A | Discharge status: Home / Self Care | Payer: Medicare Other | Source: Ambulatory Visit | Attending: Interventional Radiology | Admitting: Interventional Radiology

## 2013-04-30 DIAGNOSIS — R042 Hemoptysis: Secondary | ICD-10-CM | POA: Insufficient documentation

## 2013-04-30 DIAGNOSIS — M25519 Pain in unspecified shoulder: Secondary | ICD-10-CM | POA: Insufficient documentation

## 2013-04-30 DIAGNOSIS — R918 Other nonspecific abnormal finding of lung field: Secondary | ICD-10-CM

## 2013-04-30 DIAGNOSIS — M549 Dorsalgia, unspecified: Secondary | ICD-10-CM | POA: Insufficient documentation

## 2013-04-30 DIAGNOSIS — R222 Localized swelling, mass and lump, trunk: Secondary | ICD-10-CM | POA: Insufficient documentation

## 2013-04-30 DIAGNOSIS — C3410 Malignant neoplasm of upper lobe, unspecified bronchus or lung: Secondary | ICD-10-CM | POA: Insufficient documentation

## 2013-04-30 DIAGNOSIS — C770 Secondary and unspecified malignant neoplasm of lymph nodes of head, face and neck: Secondary | ICD-10-CM | POA: Insufficient documentation

## 2013-04-30 DIAGNOSIS — C801 Malignant (primary) neoplasm, unspecified: Secondary | ICD-10-CM | POA: Insufficient documentation

## 2013-04-30 LAB — CBC
HCT: 40 % (ref 39.0–52.0)
HEMOGLOBIN: 12.9 g/dL — AB (ref 13.0–17.0)
MCH: 30.2 pg (ref 26.0–34.0)
MCHC: 32.3 g/dL (ref 30.0–36.0)
MCV: 93.7 fL (ref 78.0–100.0)
Platelets: 365 10*3/uL (ref 150–400)
RBC: 4.27 MIL/uL (ref 4.22–5.81)
RDW: 14.2 % (ref 11.5–15.5)
WBC: 10.8 10*3/uL — ABNORMAL HIGH (ref 4.0–10.5)

## 2013-04-30 LAB — PROTIME-INR
INR: 1.01 (ref 0.00–1.49)
Prothrombin Time: 13.1 seconds (ref 11.6–15.2)

## 2013-04-30 LAB — APTT: APTT: 36 s (ref 24–37)

## 2013-04-30 MED ORDER — FENTANYL CITRATE 0.05 MG/ML IJ SOLN
INTRAMUSCULAR | Status: AC | PRN
  Administered 2013-04-30: 25 ug via INTRAVENOUS
  Administered 2013-04-30: 50 ug via INTRAVENOUS

## 2013-04-30 MED ORDER — LIDOCAINE HCL 1 % IJ SOLN
INTRAMUSCULAR | Status: AC
  Filled 2013-04-30: qty 10

## 2013-04-30 MED ORDER — MIDAZOLAM HCL 2 MG/2ML IJ SOLN
INTRAMUSCULAR | Status: AC | PRN
  Administered 2013-04-30 (×2): 1 mg via INTRAVENOUS

## 2013-04-30 MED ORDER — FENTANYL CITRATE 0.05 MG/ML IJ SOLN
INTRAMUSCULAR | Status: AC
  Filled 2013-04-30: qty 4

## 2013-04-30 MED ORDER — SODIUM CHLORIDE 0.9 % IV SOLN
INTRAVENOUS | Status: DC
Start: 2013-04-30 — End: 2013-05-01

## 2013-04-30 MED ORDER — MIDAZOLAM HCL 2 MG/2ML IJ SOLN
INTRAMUSCULAR | Status: AC
  Filled 2013-04-30: qty 4

## 2013-04-30 NOTE — H&P (Signed)
Terry Robles is an 73 y.o. male.   Chief Complaint: Pt has had Rt back and shoulder pain x 6 mos Hemoptysis x 4 weeks Referred for evaluation CXR reveals RUL mass CT confirms findings PET 04/25/13 reveals + RUL mass with + LAN Scheduled now for RUL mass biopsy  HPI: HLD; BPH; previous smoker- quit 15 yrs ago  Past Medical History  Diagnosis Date  . Hypercholesteremia 05/14/2011  . BPH (benign prostatic hyperplasia) 05/14/2011    Past Surgical History  Procedure Laterality Date  . Tonsillectomy    . Other surgical history      benign tumor removed from left leg several years ago  . Other surgical history      had melanoma/squamous cell removed previously per patient    Family History  Problem Relation Age of Onset  . COPD Paternal Grandfather   . Breast cancer Paternal Aunt    Social History:  reports that he quit smoking about 15 years ago. His smoking use included Cigarettes. He has a 42 pack-year smoking history. He has never used smokeless tobacco. He reports that he drinks alcohol. He reports that he does not use illicit drugs.  Allergies: No Active Allergies   (Not in a hospital admission)  No results found for this or any previous visit (from the past 48 hour(s)). No results found.  Review of Systems  Constitutional: Negative for fever and weight loss.  Respiratory: Positive for cough and hemoptysis.   Cardiovascular: Positive for chest pain.  Gastrointestinal: Negative for nausea, vomiting and abdominal pain.  Musculoskeletal: Positive for back pain and joint pain.       Rt shoulder pain  Neurological: Negative for dizziness, weakness and headaches.  Psychiatric/Behavioral: Negative for substance abuse.    Blood pressure 118/63, pulse 77, temperature 98 F (36.7 C), temperature source Oral, resp. rate 18, height 6\' 1"  (1.854 m), weight 221 lb (100.245 kg), SpO2 96.00%. Physical Exam  Constitutional: He is oriented to person, place, and time.   Cardiovascular: Normal rate, regular rhythm and normal heart sounds.   No murmur heard. Respiratory: Effort normal and breath sounds normal. He has no wheezes.  GI: Soft. Bowel sounds are normal. There is no tenderness.  Musculoskeletal: Normal range of motion.  Neurological: He is alert and oriented to person, place, and time.  Skin: Skin is warm and dry.  Psychiatric: He has a normal mood and affect. His behavior is normal. Judgment and thought content normal.     Assessment/Plan Rt back and shoulder pain; hemoptysis Abn CXR and CT +PET 04/25/13 Now scheduled for RUL mass biopsy Pt aware of procedure benefits and risks and agreeable to proceed Consent signed and in chart  Terry Robles A 04/30/2013, 8:14 AM

## 2013-04-30 NOTE — Discharge Instructions (Signed)
Needle Biopsy of Lung, Care After Refer to this sheet in the next few weeks. These instructions provide you with information on caring for yourself after your procedure. Your health care provider may also give you more specific instructions. Your treatment has been planned according to current medical practices, but problems sometimes occur. Call your health care provider if you have any problems or questions after your procedure. WHAT TO EXPECT AFTER THE PROCEDURE A bandage will be applied over the areas where the needle was inserted. You may be asked to apply pressure to the bandage for several minutes to ensure there is minimal bleeding. In most cases, you can leave when your needle biopsy procedure is completed. Do not drive yourself home. Someone else should take you home. If you received an IV sedative or general anesthetic, you will be taken to a comfortable place to relax while the medication wears off. If you have upcoming travel scheduled, talk to your doctor about when it is safe to travel by air after the procedure. HOME CARE INSTRUCTIONS Expect to take it easy for the rest of the day. Protect the area where you received the needle biopsy by keeping the bandage in place for as long as instructed. You may feel some mild pain or discomfort in the area, but this should stop in a day or two. Only take over-the-counter or prescription medicines for pain, discomfort, or fever as directed by your caregiver. SEEK MEDICAL CARE IF:   You have pain at the biopsy site that worsens or is not helped by medication.  You have swelling or drainage at the needle biopsy site.  You have a fever. SEEK IMMEDIATE MEDICAL CARE IF:   You have new or worsening shortness of breath.  You have chest pain.  You are coughing up blood.  You have bleeding that does not stop with pressure or a bandage.  You develop light-headedness or fainting. Document Released: 01/09/2007 Document Revised: 11/14/2012 Document  Reviewed: 08/06/2012 [facility] Patient Information 2014 [place].

## 2013-04-30 NOTE — Procedures (Signed)
CT core biopsy RUL lung mass 18g x4 to surg path No complication No blood loss. See complete dictation in [facility].

## 2013-05-01 ENCOUNTER — Encounter: Payer: Self-pay | Admitting: Internal Medicine

## 2013-05-01 MED ORDER — ACETAMINOPHEN-CODEINE #3 300-30 MG PO TABS: ORAL_TABLET | ORAL | Status: DC

## 2013-05-01 NOTE — Telephone Encounter (Addendum)
rx printed and faxed to [facility]. Pt aware. Somers Bing, CMA

## 2013-05-01 NOTE — Telephone Encounter (Signed)
Pt is asking for a refill on Tylenol #3. LAst refill given on 04-16-13 for # 60, instructions are to take 1 every 4 hours as needed. Please advise if ok to refill? [place] Bing, CMA

## 2013-05-02 ENCOUNTER — Other Ambulatory Visit: Payer: Self-pay | Admitting: Internal Medicine

## 2013-05-02 DIAGNOSIS — R918 Other nonspecific abnormal finding of lung field: Secondary | ICD-10-CM

## 2013-05-06 ENCOUNTER — Encounter: Payer: Self-pay | Admitting: Internal Medicine

## 2013-05-06 ENCOUNTER — Other Ambulatory Visit: Payer: Self-pay | Admitting: Internal Medicine

## 2013-05-06 ENCOUNTER — Telehealth: Payer: Self-pay | Admitting: *Deleted

## 2013-05-06 DIAGNOSIS — R918 Other nonspecific abnormal finding of lung field: Secondary | ICD-10-CM

## 2013-05-06 NOTE — Telephone Encounter (Signed)
Dr. Melvyn Novas do we need to set up an appointment for this pt? Thanks.

## 2013-05-06 NOTE — Telephone Encounter (Signed)
Called pt regarding appt for Terry Robles [facility] 05/09/13.  He verbalized understanding of time and place of appt

## 2013-05-06 NOTE — Telephone Encounter (Signed)
Pt has just heard from the [facility]. Nothing further is needed.

## 2013-05-09 ENCOUNTER — Institutional Professional Consult (permissible substitution) (INDEPENDENT_AMBULATORY_CARE_PROVIDER_SITE_OTHER): Payer: Medicare Other | Admitting: Cardiothoracic Surgery

## 2013-05-09 ENCOUNTER — Ambulatory Visit
Admission: RE | Admit: 2013-05-09 | Discharge: 2013-05-09 | Disposition: A | Discharge status: Home / Self Care | Payer: Medicare Other | Source: Ambulatory Visit | Attending: Radiation Oncology | Admitting: Radiation Oncology

## 2013-05-09 ENCOUNTER — Encounter: Payer: Self-pay | Admitting: *Deleted

## 2013-05-09 ENCOUNTER — Ambulatory Visit: Payer: Medicare Other | Attending: Internal Medicine | Admitting: Physical Therapy

## 2013-05-09 ENCOUNTER — Encounter: Payer: Self-pay | Admitting: Internal Medicine

## 2013-05-09 ENCOUNTER — Encounter: Payer: Self-pay | Admitting: Radiation Oncology

## 2013-05-09 ENCOUNTER — Telehealth: Payer: Self-pay | Admitting: Internal Medicine

## 2013-05-09 ENCOUNTER — Ambulatory Visit (HOSPITAL_BASED_OUTPATIENT_CLINIC_OR_DEPARTMENT_OTHER): Payer: Medicare Other | Admitting: Internal Medicine

## 2013-05-09 VITALS — BP 110/62 | HR 74 | Temp 97.2°F | Resp 18 | Ht 73.0 in | Wt 219.5 lb

## 2013-05-09 DIAGNOSIS — M4120 Other idiopathic scoliosis, site unspecified: Secondary | ICD-10-CM | POA: Insufficient documentation

## 2013-05-09 DIAGNOSIS — C3410 Malignant neoplasm of upper lobe, unspecified bronchus or lung: Secondary | ICD-10-CM

## 2013-05-09 DIAGNOSIS — R293 Abnormal posture: Secondary | ICD-10-CM | POA: Insufficient documentation

## 2013-05-09 DIAGNOSIS — R042 Hemoptysis: Secondary | ICD-10-CM | POA: Insufficient documentation

## 2013-05-09 DIAGNOSIS — Z85828 Personal history of other malignant neoplasm of skin: Secondary | ICD-10-CM | POA: Insufficient documentation

## 2013-05-09 DIAGNOSIS — M25519 Pain in unspecified shoulder: Secondary | ICD-10-CM | POA: Insufficient documentation

## 2013-05-09 DIAGNOSIS — R52 Pain, unspecified: Secondary | ICD-10-CM

## 2013-05-09 DIAGNOSIS — M549 Dorsalgia, unspecified: Secondary | ICD-10-CM | POA: Insufficient documentation

## 2013-05-09 DIAGNOSIS — Z85820 Personal history of malignant melanoma of skin: Secondary | ICD-10-CM | POA: Insufficient documentation

## 2013-05-09 DIAGNOSIS — Z87891 Personal history of nicotine dependence: Secondary | ICD-10-CM | POA: Insufficient documentation

## 2013-05-09 DIAGNOSIS — R222 Localized swelling, mass and lump, trunk: Secondary | ICD-10-CM

## 2013-05-09 DIAGNOSIS — IMO0001 Reserved for inherently not codable concepts without codable children: Secondary | ICD-10-CM | POA: Insufficient documentation

## 2013-05-09 DIAGNOSIS — C3490 Malignant neoplasm of unspecified part of unspecified bronchus or lung: Secondary | ICD-10-CM

## 2013-05-09 DIAGNOSIS — R918 Other nonspecific abnormal finding of lung field: Secondary | ICD-10-CM

## 2013-05-09 MED ORDER — OXYCODONE-ACETAMINOPHEN 5-325 MG PO TABS: 1.0000 | ORAL_TABLET | Freq: Four times a day (QID) | ORAL | Status: DC | PRN

## 2013-05-09 MED ORDER — PROCHLORPERAZINE MALEATE 10 MG PO TABS: 10.0000 mg | ORAL_TABLET | Freq: Four times a day (QID) | ORAL | Status: DC | PRN

## 2013-05-09 NOTE — Patient Instructions (Signed)
We discussed your treatment options today including a course of concurrent chemoradiation. First cycle of treatment on 05/20/2013. Followup visit in 3 weeks.

## 2013-05-09 NOTE — Progress Notes (Signed)
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  HISTORY OF PRESENT ILLNESS::Terry Robles is a 73 y.o. male who presented with new onset right back and shoulder pain for 6 months and hemoptysis during the 2nd week of January 2015.  CT scan on 04/12/13 He was referred 04/16/2013 showed an 8.7 cm right superior sulcus tumor with adjacent adenopathy. PET-CT showed  hypermetabolism in the right upper lobe mass and right hilar metastatic lymph nodes consistent with T3 N2 M0.  CT-biopsy of the right upper lung shows non-small cell carcinoma.  PREVIOUS RADIATION THERAPY: No  PAST MEDICAL HISTORY:  has a past medical history of Hypercholesteremia (05/14/2011) and BPH (benign prostatic hyperplasia) (05/14/2011).    PAST SURGICAL HISTORY: Past Surgical History  Procedure Laterality Date  . Tonsillectomy    . Other surgical history      benign tumor removed from left leg several years ago  . Other surgical history      had melanoma/squamous cell removed previously per patient    FAMILY HISTORY: family history includes Breast cancer in his paternal aunt; COPD in his paternal grandfather.  SOCIAL HISTORY:  reports that he quit smoking about 15 years ago. His smoking use included Cigarettes. He has a 42 pack-year smoking history. He has never used smokeless tobacco. He reports that he drinks alcohol. He reports that he does not use illicit drugs.  ALLERGIES: Review of patient's allergies indicates no active allergies.  MEDICATIONS:  Current Outpatient Prescriptions  Medication Sig Dispense Refill  . acetaminophen-codeine (TYLENOL #3)  300-30 MG per tablet One every 4 hours for pain or cough  60 tablet  0  . aspirin 81 MG tablet Take 81 mg by mouth daily.      . diphenhydramine-acetaminophen (TYLENOL PM) 25-500 MG TABS Take 1 tablet by mouth at bedtime as needed.      . Multiple Vitamin (MULITIVITAMIN WITH MINERALS) TABS Take 1 tablet by mouth daily.      . Tamsulosin HCl (FLOMAX) 0.4 MG CAPS Take 0.4 mg by mouth daily after supper.       No current facility-administered medications for this encounter.    REVIEW OF SYSTEMS:  A 15 point review of systems is documented in the electronic medical record. This was obtained by the nursing staff. However, I reviewed this with the patient to discuss relevant findings and make appropriate changes.  Pertinent items are noted in HPI.   PHYSICAL EXAM:  vitals were not taken for this visit.  Per Dr. Melvyn Novas: Terry Robles  HEENT: nl dentition, turbinates, and orophanx. Nl external ear canals without cough reflex  NECK : without JVD/Nodes/TM/ nl carotid upstrokes bilaterally  LUNGS: no acc muscle use, clear to A and P bilaterally without cough on insp or exp maneuvers  CV: RRR no s3 or murmur or increase in P2, no edema  ABD: soft and nontender with nl excursion in the supine position. No bruits or organomegaly, bowel sounds nl  MS: warm without deformities, calf tenderness, cyanosis or clubbing  SKIN: warm and dry without lesions  NEURO: alert, approp, no deficits   Terry Robles = 80  100 -  Normal; no complaints; no evidence of disease. 90   - Able to carry on normal activity; minor signs or symptoms of disease. 80   - Normal activity with effort; some signs or symptoms of disease. 33   - Cares for self; unable to carry on normal activity or to do active work. 60   - Requires occasional assistance, but is able to care for most of his personal needs. 50   - Requires considerable assistance and frequent medical care. 54   - Disabled; requires special care and assistance. 4   - Severely  disabled; hospital admission is indicated although death not imminent. 79   - Very sick; hospital admission necessary; active supportive treatment necessary. 10   - Moribund; fatal processes progressing rapidly. 0     - Dead  Karnofsky DA, Abelmann [place], Craver LS and Nixon JH [phone]) The use of the nitrogen mustards in the palliative treatment of carcinoma: with particular reference to bronchogenic carcinoma Cancer 1 [id number]  LABORATORY DATA:  Lab Results  Component Value Date   WBC 10.8* 04/30/2013   HGB 12.9* 04/30/2013   HCT 40.0 04/30/2013   MCV 93.7 04/30/2013   PLT 365 04/30/2013   Lab Results  Component Value Date   NA 135 05/15/2011   K 3.7 05/15/2011   CL 102 05/15/2011   CO2 26 05/15/2011   Lab Results  Component Value Date   ALT 46 05/15/2011   AST 38* 05/15/2011   ALKPHOS 71 05/15/2011   BILITOT 1.4* 05/15/2011    PULMONARY FUNCTION TEST:  Not yet performed   RADIOGRAPHY: Dg Chest 1 View  04/30/2013   CLINICAL DATA:  Right upper lobe mass, post percutaneous biopsy  EXAM: CHEST - 1 VIEW  COMPARISON:  04/25/2013 and earlier studies  FINDINGS: No pneumothorax. Stable right upper lobe/apical mass with probable pathologic fracture through the posterior aspect right second rib. Left lung clear. Heart size normal. No effusion.  IMPRESSION: No pneumothorax post right lung biopsy   Electronically Signed   By: Arne Cleveland M.D.   On: 04/30/2013 13:12   Ct Chest W Contrast  04/12/2013   CLINICAL DATA:  Hemoptysis.  EXAM: CT CHEST WITH CONTRAST  TECHNIQUE: Multidetector CT imaging of the chest was performed during intravenous contrast administration.  CONTRAST:  185mL OMNIPAQUE IOHEXOL 300 MG/ML  SOLN  COMPARISON:  DG CHEST 1V PORT dated 05/14/2011  FINDINGS: 8.5 x 6.7 by 8.7 cm right Pancoast tumor invades the typical chest wall with bony destruction of the right second and third ribs along the tumor margin. Confluent adenopathy related to the mass is present in the right suprahilar and hilar  regions. One hilar node on image 27 of series 2 measures 1.6 cm in short axis. A lower right paratracheal node measures 0.8 cm in short axis on image 20 of series 2.  The tumor and adjacent adenopathy cause extrinsic narrowing of the right upper lobe pulmonary artery. Please note that although today's exam does not show obvious clot in the large pulmonary arteries, it was not protocol does a CT angiography examination.  The smoothly marginated 5.3 x 1.9 by 3.8 cm multilobulated subcutaneous lesion with internal density of approximately 44 Hounsfield units is present along the left anterolateral chest wall. Faint posterior calcification along the margin of this lesion.  Thoracic spondylosis noted. Centrilobular emphysema. 3 mm left upper lobe pulmonary nodule, image 28 of series 3. 4 mm left lower lobe pulmonary nodule, image 42 of series 3. 0.8 x 0.6  cm subpleural nodule in the left lower lobe, image 35 of series 3.  IMPRESSION: 1. 8.5 cm right apical lung cancer (Pancoast tumor) invades the right apical/posterior chest wall with bony invasion of the right second and third ribs. Confluent right hilar and suprahilar adenopathy. 2. Several left-sided pulmonary ill nodules, largest 7 mm in average size, nonspecific for benign versus malignant nodules. 3. Smoothly marginated and fairly homogeneous 5.3 x 1.9 x 3.8 cm multilobulated subcutaneous lesion if along the left lateral chest wall noted. This is homogeneous could represent a ganglion cyst or benign lesion. A subcutaneous metastatic lesion would be unusual but not impossible in light of the right lung findings. This lesion is probably readily palpable; a long patient history of its palpable lesion in this vicinity without significant change would favor a benign etiology for this left-sided lesion.   Electronically Signed   By: Sherryl Barters M.D.   On: 04/12/2013 09:25   Nm Pet Image Initial (pi) Skull Base To Thigh  04/25/2013   CLINICAL DATA:  Initial  treatment strategy for lung mass.  EXAM: NUCLEAR MEDICINE PET SKULL BASE TO THIGH  FASTING BLOOD GLUCOSE:  Value: 104mg /dl  TECHNIQUE: 16.6 mCi F-18 FDG was injected intravenously. CT data was obtained and used for attenuation correction and anatomic localization only. (This was not acquired as a diagnostic CT examination.) Additional exam technical data entered on technologist worksheet.  COMPARISON:  DG CHEST 1V PORT dated 05/14/2011; CT CHEST W/CM dated 04/12/2013  FINDINGS: NECK  No hypermetabolic lymph nodes in the neck.  CHEST  Large right upper lobe mass measuring 7.6 x 7.1 cm has intense metabolic activity with SUV max 25.9. There is a hypermetabolic right hilar lymph node measuring 22 mm (image 93, series 2) with SUV max 21.7.  There is a mildly enlarged right lower paratracheal node measuring 12 mm short axis (image 80) with mild metabolic activity (SUV max 3.4). No supraclavicular or contralateral hypermetabolic nodes.  ABDOMEN/PELVIS  No abnormal hypermetabolic activity within the liver, pancreas, adrenal glands, or spleen. No hypermetabolic lymph nodes in the abdomen or pelvis.  SKELETON  No focal hypermetabolic activity to suggest skeletal metastasis.  IMPRESSION: 1. Large hypermetabolic right upper lobe mass consists with primary bronchogenic carcinoma. 2. Hypermetabolic right hilar metastatic lymph node. 3. Mild metabolic activity of right lower paratracheal lymph node is concerning metastasis but indeterminate. 4. Staging by FDG PET-CT imaging T3 N2 M0   Electronically Signed   By: Suzy Bouchard M.D.   On: 04/25/2013 09:38   Ct Biopsy  04/30/2013   CLINICAL DATA:  Hypermetabolic right apical lung lesion with chest wall involvement  EXAM: CT GUIDED CORE BIOPSY OF RIGHT UPPER LOBE LUNG LESION  ANESTHESIA/SEDATION: Intravenous Fentanyl and Versed were administered as conscious sedation during continuous cardiorespiratory monitoring by the radiology RN, with a total moderate sedation time of 7  minutes.  PROCEDURE: The procedure risks, benefits, and alternatives were explained to the patient. Questions regarding the procedure were encouraged and answered. The patient understands and consents to the procedure.  The posterior right upper chest was prepped with betadinein a sterile fashion, and a sterile drape was applied covering the operative field. A sterile gown and sterile gloves were used for the procedure. Local anesthesia was provided with 1% Lidocaine.  Under CT fluoroscopic guidance, a 17 gauge trocar needle was advanced to the margin of the lesion. Once needle tip position was confirmed, coaxial 18-gauge core biopsy samples were obtained, submitted in formalin to surgical pathology. The  guide needle was removed. Postprocedure scans show no pneumothorax or hemorrhage. The patient tolerated the procedure well.  Complications: none  FINDINGS: Right apical lung mass with posterior chest wall involvement. Technically successful core biopsy as above.  IMPRESSION: 1. Technically successful right upper lobe lung core needle biopsy. Observation and follow-up radiography scheduled.   Electronically Signed   By: Arne Cleveland M.D.   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 His tumor appears to be invading the chest wall.  Pre-operative chemoradiotherapy may help to facilitate resection.   PLAN: Today, I talked to the patient and family about the findings and work-up thus far.  We discussed the natural history of disease and general treatment, highlighting the role or radiotherapy in the management.  We discussed the available radiation techniques, and focused on the details of logistics and delivery.  We reviewed the anticipated acute and late sequelae associated with radiation in this setting.  The patient was encouraged to ask questions that I answered to the best of my ability.  I filled out a  patient counseling form during our discussion including treatment diagrams.  We retained a copy for our records.  The patient would like to proceed with radiation and will be scheduled for CT simulation.  I spent 60 minutes minutes face to face with the patient and more than 50% of that time was spent in counseling and/or coordination of care.   ------------------------------------------------  Sheral Apley. Tammi Klippel, M.D.

## 2013-05-09 NOTE — Progress Notes (Signed)
[name] met with patient, patient's spouse, and MD during [name] today.  Medical oncologist reviewed patient's diagnosis and treatment plan.  Terry Robles had no questions at this time and is ready to begin treatment.  Patient and patient's spouse have been married for 57 years and have two children that live close by.  CSW reviewed patient support services with patient/family.  Patient's spouse shared that [name] is "not a talker", CSW discussed positive coping skills and support programs available regardless of coping style.  CSW encouraged patient/family to contact CSW with any questions or concerns.   Polo Riley, MSW, LCSW, OSW-C Clinical Social Worker [facility] [phone]

## 2013-05-09 NOTE — Telephone Encounter (Signed)
gv adn printed appt sched and avs for pt for Feb and March....sed added tx.Marland KitchenMarland Kitchen

## 2013-05-09 NOTE — Progress Notes (Signed)
[place] [phone]   [phone] Multidisciplinary thoracic oncology clinic ([place]) CONSULT NOTE  REFERRING PHYSICIAN: Dr. Christinia Gully  REASON FOR CONSULTATION:  73 years old white male recently diagnosed with lung cancer.  HPI Terry Robles is a 73 y.o. male with past medical history significant for dyslipidemia and benign prostatic hypertrophy as well as long history of smoking but quit 15 years ago. The patient mentions that he has been complaining of cough for more than 6 months but 4 weeks ago his cough was associated with hemoptysis and he was seen by his primary care physician Dr. Iona Beard. He ordered CT scan of the chest which was performed on 04/12/2013 and it showed 8.5 x 6.7 x 8.7 CM a right Pancoast tumor invading the typical chest with bony destruction of the right second and third ribs along the tumor margin. There was confluent adenopathy related to the mass present in the right suprahilar and hilar regions. 1 lymph node measured 1.6 CM in short axis and a lower right peritracheal node measured 0.8 CM in short axis. The tumor and adjacent adenopathy cause extrinsic narrowing of the right upper lobe pulmonary artery. The patient was referred to Dr. Melvyn Novas and a PET scan was performed in 04/25/2013. It showed a large hypermetabolic right upper lobe mass consistent with primary bronchogenic carcinoma. There is hypermetabolic right hilar metastatic lymph nodes and mild metabolic activity of right lower paratracheal lymph node concerning for metastases but indeterminate.  On 04/30/2013, the patient underwent CT-guided core biopsy of the right upper lobe lung lesion by interventional radiology.  The final pathology (Accession: [id number]) also right upper lobe biopsy was consistent with non-small cell carcinoma. The core biopsies are extensively involved by non small cell carcinoma associated with large areas of necrosis. The morphologic features  favor squamous cell carcinoma. Immunohistochemistry is performed and the tumor is positive with Cytokeratin 5/6, Cytokeratin 903 and p63. The tumor is negative with Cytokeratin 7, Napsin A, Thyroid Transcription Factor -1 (TTF-1) and Cytokeratin 20 The immunohistochemical findings are consistent with the morphologic impression of squamous cell carcinoma. Dr. Melvyn Novas kindly referred the patient to me today for evaluation and recommendation regarding treatment of his condition. The patient continues to complain of aching pain especially on the right side of the chest with radiation to the shoulder and down to his right arm. He also has shortness breath with exertion and mild cough productive of clear sputum but no more hemoptysis. He lost around 15 pounds in the last 4 weeks secondary to lack of appetite. He denied having any significant nausea or vomiting. He denied having any headache or blurry vision.  Family history significant for her father who died from hepatitis secondary to blood transfusion, mother died from COPD at age 15 and paternal grandmother died from pancreatic cancer, maternal grandfather died from a stroke and brother died from melanoma at age 18.  The patient is married and has 2 daughters. He was accompanied by his wife today. 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 He has a history of smoking 2-3 packs per day for around 42 years but quit 15 years ago. He drinks alcohol occasionally and no history of drug abuse. HPI  Past Medical History  Diagnosis Date  . Hypercholesteremia 05/14/2011  . BPH (benign prostatic hyperplasia) 05/14/2011    Past Surgical History  Procedure Laterality Date  . Tonsillectomy    . Other surgical history  benign tumor removed from left leg several years ago  . Other surgical history      had melanoma/squamous cell removed previously per patient    Family History  Problem Relation Age  of Onset  . COPD Paternal Grandfather   . Breast cancer Paternal Aunt     Social History History  Substance Use Topics  . Smoking status: Former Smoker -- 1.00 packs/day for 42 years    Types: Cigarettes    Quit date: 03/28/1998  . Smokeless tobacco: Never Used  . Alcohol Use: Yes     Comment: occasional/ beer - no more than one beer in 24 hrs     No Known Allergies  Current Outpatient Prescriptions  Medication Sig Dispense Refill  . Multiple Vitamin (MULITIVITAMIN WITH MINERALS) TABS Take 1 tablet by mouth daily.      . Tamsulosin HCl (FLOMAX) 0.4 MG CAPS Take 0.4 mg by mouth daily after supper.      . diphenhydramine-acetaminophen (TYLENOL PM) 25-500 MG TABS Take 1 tablet by mouth at bedtime as needed.      Marland Kitchen oxyCODONE-acetaminophen (PERCOCET/ROXICET) 5-325 MG per tablet Take 1 tablet by mouth every 6 (six) hours as needed for severe pain.  60 tablet  0  . prochlorperazine (COMPAZINE) 10 MG tablet Take 1 tablet (10 mg total) by mouth every 6 (six) hours as needed for nausea or vomiting.  60 tablet  0   No current facility-administered medications for this visit.    Review of Systems  Constitutional: positive for anorexia and weight loss Eyes: negative Ears, nose, mouth, throat, and face: negative Respiratory: positive for cough, dyspnea on exertion and pleurisy/chest pain Cardiovascular: negative Gastrointestinal: negative Genitourinary:negative Integument/breast: negative Hematologic/lymphatic: negative Musculoskeletal:negative Neurological: negative Behavioral/Psych: negative Endocrine: negative Allergic/Immunologic: negative  Physical Exam  ION:GEXBM, healthy, no distress, well nourished and well developed SKIN: skin color, texture, turgor are normal, no rashes or significant lesions HEAD: Normocephalic, No masses, lesions, tenderness or abnormalities EYES: normal, PERRLA EARS: External ears normal, Canals clear OROPHARYNX:no exudate, no erythema and lips,  buccal mucosa, and tongue normal  NECK: supple, no adenopathy, no JVD LYMPH:  no palpable lymphadenopathy, no hepatosplenomegaly LUNGS: clear to auscultation , and palpation HEART: regular rate & rhythm, no murmurs and no gallops ABDOMEN:abdomen soft, non-tender, normal bowel sounds and no masses or organomegaly BACK: Back symmetric, no curvature., No CVA tenderness EXTREMITIES:no joint deformities, effusion, or inflammation, no edema, no skin discoloration  NEURO: alert & oriented x 3 with fluent speech, no focal motor/sensory deficits  PERFORMANCE STATUS: ECOG 1  LABORATORY DATA: Lab Results  Component Value Date   WBC 10.8* 04/30/2013   HGB 12.9* 04/30/2013   HCT 40.0 04/30/2013   MCV 93.7 04/30/2013   PLT 365 04/30/2013      Chemistry      Component Value Date/Time   NA 135 05/15/2011 0630   K 3.7 05/15/2011 0630   CL 102 05/15/2011 0630   CO2 26 05/15/2011 0630   BUN 13 05/15/2011 0630   CREATININE 1.10 04/12/2013 0816      Component Value Date/Time   CALCIUM 8.9 05/15/2011 0630   ALKPHOS 71 05/15/2011 0630   AST 38* 05/15/2011 0630   ALT 46 05/15/2011 0630   BILITOT 1.4* 05/15/2011 0630       RADIOGRAPHIC STUDIES: Dg Chest 1 View  04/30/2013   CLINICAL DATA:  Right upper lobe mass, post percutaneous biopsy  EXAM: CHEST - 1 VIEW  COMPARISON:  04/25/2013 and earlier studies  FINDINGS:  No pneumothorax. Stable right upper lobe/apical mass with probable pathologic fracture through the posterior aspect right second rib. Left lung clear. Heart size normal. No effusion.  IMPRESSION: No pneumothorax post right lung biopsy   Electronically Signed   By: Arne Cleveland M.D.   On: 04/30/2013 13:12   Ct Chest W Contrast  04/12/2013   CLINICAL DATA:  Hemoptysis.  EXAM: CT CHEST WITH CONTRAST  TECHNIQUE: Multidetector CT imaging of the chest was performed during intravenous contrast administration.  CONTRAST:  149mL OMNIPAQUE IOHEXOL 300 MG/ML  SOLN  COMPARISON:  DG CHEST 1V PORT dated 05/14/2011   FINDINGS: 8.5 x 6.7 by 8.7 cm right Pancoast tumor invades the typical chest wall with bony destruction of the right second and third ribs along the tumor margin. Confluent adenopathy related to the mass is present in the right suprahilar and hilar regions. One hilar node on image 27 of series 2 measures 1.6 cm in short axis. A lower right paratracheal node measures 0.8 cm in short axis on image 20 of series 2.  The tumor and adjacent adenopathy cause extrinsic narrowing of the right upper lobe pulmonary artery. Please note that although today's exam does not show obvious clot in the large pulmonary arteries, it was not protocol does a CT angiography examination.  The smoothly marginated 5.3 x 1.9 by 3.8 cm multilobulated subcutaneous lesion with internal density of approximately 44 Hounsfield units is present along the left anterolateral chest wall. Faint posterior calcification along the margin of this lesion.  Thoracic spondylosis noted. Centrilobular emphysema. 3 mm left upper lobe pulmonary nodule, image 28 of series 3. 4 mm left lower lobe pulmonary nodule, image 42 of series 3. 0.8 x 0.6 cm subpleural nodule in the left lower lobe, image 35 of series 3.  IMPRESSION: 1. 8.5 cm right apical lung cancer (Pancoast tumor) invades the right apical/posterior chest wall with bony invasion of the right second and third ribs. Confluent right hilar and suprahilar adenopathy. 2. Several left-sided pulmonary ill nodules, largest 7 mm in average size, nonspecific for benign versus malignant nodules. 3. Smoothly marginated and fairly homogeneous 5.3 x 1.9 x 3.8 cm multilobulated subcutaneous lesion if along the left lateral chest wall noted. This is homogeneous could represent a ganglion cyst or benign lesion. A subcutaneous metastatic lesion would be unusual but not impossible in light of the right lung findings. This lesion is probably readily palpable; a long patient history of its palpable lesion in this vicinity  without significant change would favor a benign etiology for this left-sided lesion.   Electronically Signed   By: Sherryl Barters M.D.   On: 04/12/2013 09:25   Nm Pet Image Initial (pi) Skull Base To Thigh  04/25/2013   CLINICAL DATA:  Initial treatment strategy for lung mass.  EXAM: NUCLEAR MEDICINE PET SKULL BASE TO THIGH  FASTING BLOOD GLUCOSE:  Value: 104mg /dl  TECHNIQUE: 16.6 mCi F-18 FDG was injected intravenously. CT data was obtained and used for attenuation correction and anatomic localization only. (This was not acquired as a diagnostic CT examination.) Additional exam technical data entered on technologist worksheet.  COMPARISON:  DG CHEST 1V PORT dated 05/14/2011; CT CHEST W/CM dated 04/12/2013  FINDINGS: NECK  No hypermetabolic lymph nodes in the neck.  CHEST  Large right upper lobe mass measuring 7.6 x 7.1 cm has intense metabolic activity with SUV max 25.9. There is a hypermetabolic right hilar lymph node measuring 22 mm (image 93, series 2) with SUV max 21.7.  There  is a mildly enlarged right lower paratracheal node measuring 12 mm short axis (image 80) with mild metabolic activity (SUV max 3.4). No supraclavicular or contralateral hypermetabolic nodes.  ABDOMEN/PELVIS  No abnormal hypermetabolic activity within the liver, pancreas, adrenal glands, or spleen. No hypermetabolic lymph nodes in the abdomen or pelvis.  SKELETON  No focal hypermetabolic activity to suggest skeletal metastasis.  IMPRESSION: 1. Large hypermetabolic right upper lobe mass consists with primary bronchogenic carcinoma. 2. Hypermetabolic right hilar metastatic lymph node. 3. Mild metabolic activity of right lower paratracheal lymph node is concerning metastasis but indeterminate. 4. Staging by FDG PET-CT imaging T3 N2 M0   Electronically Signed   By: Suzy Bouchard M.D.   On: 04/25/2013 09:38   Ct Biopsy  04/30/2013   CLINICAL DATA:  Hypermetabolic right apical lung lesion with chest wall involvement  EXAM: CT GUIDED CORE  BIOPSY OF RIGHT UPPER LOBE LUNG LESION  ANESTHESIA/SEDATION: Intravenous Fentanyl and Versed were administered as conscious sedation during continuous cardiorespiratory monitoring by the radiology RN, with a total moderate sedation time of 7 minutes.  PROCEDURE: The procedure risks, benefits, and alternatives were explained to the patient. Questions regarding the procedure were encouraged and answered. The patient understands and consents to the procedure.  The posterior right upper chest was prepped with betadinein a sterile fashion, and a sterile drape was applied covering the operative field. A sterile gown and sterile gloves were used for the procedure. Local anesthesia was provided with 1% Lidocaine.  Under CT fluoroscopic guidance, a 17 gauge trocar needle was advanced to the margin of the lesion. Once needle tip position was confirmed, coaxial 18-gauge core biopsy samples were obtained, submitted in formalin to surgical pathology. The guide needle was removed. Postprocedure scans show no pneumothorax or hemorrhage. The patient tolerated the procedure well.  Complications: none  FINDINGS: Right apical lung mass with posterior chest wall involvement. Technically successful core biopsy as above.  IMPRESSION: 1. Technically successful right upper lobe lung core needle biopsy. Observation and follow-up radiography scheduled.   Electronically Signed   By: Arne Cleveland M.D.   On: 04/30/2013 13:35    ASSESSMENT: This is a very pleasant 73 years old white male recently diagnosed with stage IIIA (T3, N2, M0) non-small cell lung cancer, squamous cell carcinoma presenting with a right Pancoast tumor diagnosed in February 2015.   PLAN: I had a lengthy discussion with the patient and his wife today about his current disease stage, prognosis and treatment options. I will complete the staging workup by ordering an MRI of the brain to rule out brain metastasis. I discussed with the patient his treatment options  including a course of concurrent chemoradiation with weekly carboplatin for AUC of 2 and paclitaxel 45 mg/M2. I discussed with the patient adverse effect of the chemotherapy including but not limited to alopecia, myelosuppression, nausea and vomiting, peripheral neuropathy, liver or renal dysfunction. I expect the patient to start the first dose of concurrent chemoradiation on 05/20/2013. I will arrange for him to have a chemotherapy education class before starting the first dose of his treatment. The patient was seen earlier today by Dr. Tammi Klippel for evaluation and discussion of his radiotherapy option. If the patient has good response to this treatment with resolution of the mediastinal lymphadenopathy, he may be considered for surgical resection. For pain management I will start the patient on Percocet 5/325 mg by mouth every 6 hours as needed. I will also to his pharmacy with prescription for Compazine 10 mg by  mouth every 6 hours as needed for nausea. He would come back for followup visit in 3 weeks for reevaluation and management any adverse effect of his treatment.  The patient voices understanding of current disease status and treatment options and is in agreement with the current care plan.  All questions were answered. The patient knows to call the clinic with any problems, questions or concerns. We can certainly see the patient much sooner if necessary.  Thank you so much for allowing me to participate in the care of Terry Robles. I will continue to follow up the patient with you and assist in his care.  I spent 55 minutes counseling the patient face to face. The total time spent in the appointment was 70 minutes.  Disclaimer: This note was dictated with voice recognition software. Similar sounding words can inadvertently be transcribed and may not be corrected upon review.   Shalen Petrak K. 05/09/2013, 3:10 PM

## 2013-05-10 ENCOUNTER — Other Ambulatory Visit: Payer: Self-pay | Admitting: *Deleted

## 2013-05-10 DIAGNOSIS — R918 Other nonspecific abnormal finding of lung field: Secondary | ICD-10-CM

## 2013-05-10 NOTE — Progress Notes (Signed)
[address]       [place] [address]             [phone]                    Terry Robles Medical Record #[id number] Date of Birth: 04-May-1940  Referring: Terry Rockers, MD Primary Care: Terry Font, MD  Chief Complaint: Right Upper Lobe Lung lesion with chest wall involvement   History of Present Illness:    Terry Robles 73 y.o. male is seen in the office  today for right upper lobe lung lesion with chest wall involvement. He has  past medical history of  dyslipidemia and benign prostatic hypertrophy as well as long history of smoking but quit 15 years ago. The patient noted   cough for more than 6 months but 4 weeks ago his cough was associated with hemoptysis. His primary care physician Dr. Iona Robles.  ordered CT scan of the chest which was performed on 04/12/2013 and it showed 8.5 x 6.7 x 8.7 CM a right Pancoast tumor invading the typical chest with bony destruction of the right second and third ribs along the tumor margin. There was confluent adenopathy related to the mass present in the right suprahilar and hilar regions. 1 lymph node measured 1.6 CM in short axis and a lower right peritracheal node measured 0.8 CM in short axis. The tumor and adjacent adenopathy cause extrinsic narrowing of the right upper lobe pulmonary artery.  A PET scan was performed in 04/25/2013. It showed a large hypermetabolic right upper lobe mass consistent with primary bronchogenic carcinoma. There is hypermetabolic right hilar metastatic lymph nodes and mild metabolic activity of right lower paratracheal lymph node concerning for metastases but indeterminate.   On 04/30/2013, the patient underwent CT-guided core biopsy of the right upper lobe lung lesion by interventional radiology.  The final pathology (Accession: [id number]) also right upper lobe biopsy was consistent with non-small cell carcinoma. The core biopsies are extensively involved by non small cell  carcinoma associated with large areas of necrosis. The morphologic features favor squamous cell carcinoma. Immunohistochemistry is performed and the tumor is positive with Cytokeratin 5/6, Cytokeratin 903 and p63. The tumor is negative with Cytokeratin 7, Napsin A, Thyroid Transcription Factor -1 (TTF-1) and Cytokeratin 20 The immunohistochemical findings are consistent with the morphologic impression of squamous cell carcinoma.   The patient has  aching pain especially on the right side of the chest radiating in  to the shoulder and down to his right arm. He also has shortness breath with exertion such as walking up hill  and mild cough productive of clear sputum but no more hemoptysis. He lost around 15 pounds in the last 4 weeks secondary to lack of appetite. He denied having any significant nausea or vomiting. He denied having any headache or blurry vision.     Current Activity/ Functional Status:  Patient is independent with mobility/ambulation, transfers, ADL's, IADL's.   Zubrod Score: At the time of surgery this patient's most appropriate activity status/level should be described as: []     0    Normal activity, no symptoms [x]     1    Restricted in physical strenuous activity but ambulatory, able to do out light work []     2    Ambulatory and capable of self care, unable to do work activities, up and about               >  50 % of waking hours                              []     3    Only limited self care, in bed greater than 50% of waking hours []     4    Completely disabled, no self care, confined to bed or chair []     5    Moribund   Past Medical History  Diagnosis Date  . Hypercholesteremia 05/14/2011  . BPH (benign prostatic hyperplasia) 05/14/2011    Past Surgical History  Procedure Laterality Date  . Tonsillectomy    . Other surgical history      benign tumor removed from left leg several years ago  . Other surgical history      had melanoma/squamous cell removed previously  per patient    Family History  Problem Relation Age of Onset  . COPD Paternal Grandfather   . Breast cancer Paternal Aunt   Family history significant for her father who died from hepatitis secondary to blood transfusion, mother died from COPD at age 20 and paternal grandmother died from pancreatic cancer, maternal grandfather died from a stroke and brother died from melanoma at age 85.   History   Social History  . Marital Status: Married    Spouse Name: N/A    Number of Children: N/A  . Years of Education: N/A   Occupational History  . Not on file.   Social History Main Topics  . Smoking status: Former Smoker -- 1.00 packs/day for 42 years    Types: Cigarettes    Quit date: 03/28/1998  . Smokeless tobacco: Never Used  . Alcohol Use: Yes     Comment: occasional/ beer - no more than one beer in 24 hrs   . Drug Use: No  . Sexual Activity: Not on file   Other Topics Concern  . Not on file   Social History Narrative  . No narrative on file    History  Smoking status  . Former Smoker -- 1.00 packs/day for 42 years  . Types: Cigarettes  . Quit date: 03/28/1998  Smokeless tobacco  . Never Used    History  Alcohol Use  . Yes    Comment: occasional/ beer - no more than one beer in 24 hrs      No Known Allergies  Current Outpatient Prescriptions  Medication Sig Dispense Refill  . diphenhydramine-acetaminophen (TYLENOL PM) 25-500 MG TABS Take 1 tablet by mouth at bedtime as needed.      . Multiple Vitamin (MULITIVITAMIN WITH MINERALS) TABS Take 1 tablet by mouth daily.      Marland Kitchen oxyCODONE-acetaminophen (PERCOCET/ROXICET) 5-325 MG per tablet Take 1 tablet by mouth every 6 (six) hours as needed for severe pain.  60 tablet  0  . prochlorperazine (COMPAZINE) 10 MG tablet Take 1 tablet (10 mg total) by mouth every 6 (six) hours as needed for nausea or vomiting.  60 tablet  0  . Tamsulosin HCl (FLOMAX) 0.4 MG CAPS Take 0.4 mg by mouth daily after supper.       No current  facility-administered medications for this visit.     Review of Systems:     Cardiac Review of Systems: Y or N  Chest Pain [  n  ]  Resting SOB [  n ] Exertional SOB  [ mild ]  Orthopnea [  n]   Pedal Edema [ n  ]  Palpitations [n  ] Syncope  Florencio.Farrier  ]   Presyncope [  n ]  General Review of Systems: [Y] = yes [  ]=no Constitional: recent weight change [  ];  Wt loss over the last 3 months [ 15 lbs  ] anorexia Blue.Reese  ]; fatigue [  y]; nausea [  ]; night sweats [ n ]; fever [n  ]; or chills [ n ];          Dental: poor dentition[n  ]; Last Dentist visit:   Eye : blurred vision [  ]; diplopia [   ]; vision changes [  ];  Amaurosis fugax[  ]; Resp: cough [ y ];  wheezing[  ];  hemoptysis[ y ]; shortness of breath[y  ]; paroxysmal nocturnal dyspnea[  ]; dyspnea on exertion[ y ]; or orthopnea[  ];  GI:  gallstones[  ], vomiting[  ];  dysphagia[  ]; melena[  ];  hematochezia [  ]; heartburn[  ];   Hx of  Colonoscopy[ y ]; GU: kidney stones [  ]; hematuria[  ];   dysuria [  ];  nocturia[  ];  history of     obstruction [  ]; urinary frequency [n  ]             Skin: rash, swelling[  ];, hair loss[  ];  peripheral edema[  ];  or itching[  ]; Musculosketetal: myalgias[  ];  joint swelling[  ];  joint erythema[  ];  joint pain[  ];  back pain[  ];  Heme/Lymph: bruising[  ];  bleeding[  ];  anemia[  ];  Neuro: TIA[  ];  headaches[  ];  stroke[  ];  vertigo[  ];  seizures[  ];   paresthesias[  ];  difficulty walking[n  ];  Psych:depression[  ]; anxiety[  ];  Endocrine: diabetes[ n ];  thyroid dysfunction[n  ];  Immunizations: Flu up to date [ y ]; Pneumococcal up to date Blue.Reese  ];  Other:  Physical Exam: 110/62 74 97.2 F (36.2 C) (Oral) 18 6\' 1"  (1.854 m) 219 lb 8 oz (99.565 kg)     PHYSICAL EXAMINATION:  General appearance: alert, cooperative, appears older than stated age and no distress Neurologic: intact Heart: regular rate and rhythm, S1, S2 normal, no murmur, click, rub or gallop Lungs:  diminished breath sounds RUL Abdomen: soft, non-tender; bowel sounds normal; no masses,  no organomegaly Extremities: extremities normal, atraumatic, no cyanosis or edema and Homans sign is negative, no sign of DVT Patient has no cervical or supraclavicular adenopathy no axillary adenopathy, DP and PT pulses are 2+ bilaterally brachial and radial pulses are 2+ bilaterally   Diagnostic Studies & Laboratory data:     Recent Radiology Findings:   Dg Chest 1 View  04/30/2013   CLINICAL DATA:  Right upper lobe mass, post percutaneous biopsy  EXAM: CHEST - 1 VIEW  COMPARISON:  04/25/2013 and earlier studies  FINDINGS: No pneumothorax. Stable right upper lobe/apical mass with probable pathologic fracture through the posterior aspect right second rib. Left lung clear. Heart size normal. No effusion.  IMPRESSION: No pneumothorax post right lung biopsy   Electronically Signed   By: Arne Cleveland M.D.   On: 04/30/2013 13:12   Ct Chest W Contrast  04/12/2013   CLINICAL DATA:  Hemoptysis.  EXAM: CT CHEST WITH CONTRAST  TECHNIQUE: Multidetector CT imaging of the chest was performed during intravenous contrast administration.  CONTRAST:  161mL OMNIPAQUE IOHEXOL  300 MG/ML  SOLN  COMPARISON:  DG CHEST 1V PORT dated 05/14/2011  FINDINGS: 8.5 x 6.7 by 8.7 cm right Pancoast tumor invades the typical chest wall with bony destruction of the right second and third ribs along the tumor margin. Confluent adenopathy related to the mass is present in the right suprahilar and hilar regions. One hilar node on image 27 of series 2 measures 1.6 cm in short axis. A lower right paratracheal node measures 0.8 cm in short axis on image 20 of series 2.  The tumor and adjacent adenopathy cause extrinsic narrowing of the right upper lobe pulmonary artery. Please note that although today's exam does not show obvious clot in the large pulmonary arteries, it was not protocol does a CT angiography examination.  The smoothly marginated 5.3 x 1.9  by 3.8 cm multilobulated subcutaneous lesion with internal density of approximately 44 Hounsfield units is present along the left anterolateral chest wall. Faint posterior calcification along the margin of this lesion.  Thoracic spondylosis noted. Centrilobular emphysema. 3 mm left upper lobe pulmonary nodule, image 28 of series 3. 4 mm left lower lobe pulmonary nodule, image 42 of series 3. 0.8 x 0.6 cm subpleural nodule in the left lower lobe, image 35 of series 3.  IMPRESSION: 1. 8.5 cm right apical lung cancer (Pancoast tumor) invades the right apical/posterior chest wall with bony invasion of the right second and third ribs. Confluent right hilar and suprahilar adenopathy. 2. Several left-sided pulmonary ill nodules, largest 7 mm in average size, nonspecific for benign versus malignant nodules. 3. Smoothly marginated and fairly homogeneous 5.3 x 1.9 x 3.8 cm multilobulated subcutaneous lesion if along the left lateral chest wall noted. This is homogeneous could represent a ganglion cyst or benign lesion. A subcutaneous metastatic lesion would be unusual but not impossible in light of the right lung findings. This lesion is probably readily palpable; a long patient history of its palpable lesion in this vicinity without significant change would favor a benign etiology for this left-sided lesion.   Electronically Signed   By: Sherryl Barters M.D.   On: 04/12/2013 09:25   Nm Pet Image Initial (pi) Skull Base To Thigh  04/25/2013   CLINICAL DATA:  Initial treatment strategy for lung mass.  EXAM: NUCLEAR MEDICINE PET SKULL BASE TO THIGH  FASTING BLOOD GLUCOSE:  Value: 104mg /dl  TECHNIQUE: 16.6 mCi F-18 FDG was injected intravenously. CT data was obtained and used for attenuation correction and anatomic localization only. (This was not acquired as a diagnostic CT examination.) Additional exam technical data entered on technologist worksheet.  COMPARISON:  DG CHEST 1V PORT dated 05/14/2011; CT CHEST W/CM dated  04/12/2013  FINDINGS: NECK  No hypermetabolic lymph nodes in the neck.  CHEST  Large right upper lobe mass measuring 7.6 x 7.1 cm has intense metabolic activity with SUV max 25.9. There is a hypermetabolic right hilar lymph node measuring 22 mm (image 93, series 2) with SUV max 21.7.  There is a mildly enlarged right lower paratracheal node measuring 12 mm short axis (image 80) with mild metabolic activity (SUV max 3.4). No supraclavicular or contralateral hypermetabolic nodes.  ABDOMEN/PELVIS  No abnormal hypermetabolic activity within the liver, pancreas, adrenal glands, or spleen. No hypermetabolic lymph nodes in the abdomen or pelvis.  SKELETON  No focal hypermetabolic activity to suggest skeletal metastasis.  IMPRESSION: 1. Large hypermetabolic right upper lobe mass consists with primary bronchogenic carcinoma. 2. Hypermetabolic right hilar metastatic lymph node. 3. Mild metabolic activity of right  lower paratracheal lymph node is concerning metastasis but indeterminate. 4. Staging by FDG PET-CT imaging T3 N2 M0   Electronically Signed   By: Suzy Bouchard M.D.   On: 04/25/2013 09:38   Ct Biopsy  04/30/2013   CLINICAL DATA:  Hypermetabolic right apical lung lesion with chest wall involvement  EXAM: CT GUIDED CORE BIOPSY OF RIGHT UPPER LOBE LUNG LESION  ANESTHESIA/SEDATION: Intravenous Fentanyl and Versed were administered as conscious sedation during continuous cardiorespiratory monitoring by the radiology RN, with a total moderate sedation time of 7 minutes.  PROCEDURE: The procedure risks, benefits, and alternatives were explained to the patient. Questions regarding the procedure were encouraged and answered. The patient understands and consents to the procedure.  The posterior right upper chest was prepped with betadinein a sterile fashion, and a sterile drape was applied covering the operative field. A sterile gown and sterile gloves were used for the procedure. Local anesthesia was provided with 1%  Lidocaine.  Under CT fluoroscopic guidance, a 17 gauge trocar needle was advanced to the margin of the lesion. Once needle tip position was confirmed, coaxial 18-gauge core biopsy samples were obtained, submitted in formalin to surgical pathology. The guide needle was removed. Postprocedure scans show no pneumothorax or hemorrhage. The patient tolerated the procedure well.  Complications: none  FINDINGS: Right apical lung mass with posterior chest wall involvement. Technically successful core biopsy as above.  IMPRESSION: 1. Technically successful right upper lobe lung core needle biopsy. Observation and follow-up radiography scheduled.   Electronically Signed   By: Arne Cleveland M.D.   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  2 PFT's pending 3 MRI of Brain pending  I discussed the patient's diagnosis and reviewed the CT and PET scan findings with he and his wife. I agree with Dr. Julien Nordmann and Dr. Tammi Klippel to proceed with course of radiation and concomitant chemotherapy for what appears to be clinical stage IIIa disease. I will see the patient back in one month. We will obtain pulmonary function studies on him. After the completion of radiation and chemotherapy and depending on repeat staging studies consider possible  resection.   I spent 55 minutes counseling the patient face to face. The total time spent in the appointment was 80 minutes.  Grace Isaac MD      [address] [place] [address] Office [phone]   Beeper [phone]  05/10/2013 10:44 AM

## 2013-05-14 ENCOUNTER — Other Ambulatory Visit: Payer: Medicare Other

## 2013-05-14 ENCOUNTER — Encounter: Payer: Self-pay | Admitting: *Deleted

## 2013-05-14 ENCOUNTER — Ambulatory Visit (HOSPITAL_COMMUNITY)
Admission: RE | Admit: 2013-05-14 | Discharge: 2013-05-14 | Disposition: A | Discharge status: Home / Self Care | Payer: Medicare Other | Source: Ambulatory Visit | Attending: Internal Medicine | Admitting: Internal Medicine

## 2013-05-14 DIAGNOSIS — C3490 Malignant neoplasm of unspecified part of unspecified bronchus or lung: Secondary | ICD-10-CM | POA: Insufficient documentation

## 2013-05-14 DIAGNOSIS — R51 Headache: Secondary | ICD-10-CM | POA: Insufficient documentation

## 2013-05-14 MED ORDER — GADOBENATE DIMEGLUMINE 529 MG/ML IV SOLN
20.0000 mL | Freq: Once | INTRAVENOUS | Status: AC | PRN
  Administered 2013-05-14: 20 mL via INTRAVENOUS

## 2013-05-14 NOTE — Progress Notes (Signed)
  Radiation Oncology         [phone] ________________________________  Name: Terry Robles  MRN: [id number]  Date: 05/15/2013  DOB: 06-15-1940  SIMULATION AND TREATMENT PLANNING NOTE  DIAGNOSIS:  72 yo man with stage T3 N1 M0 Squamous Cell Carcinoma of the Right Superior Sulcus  NARRATIVE:  The patient was brought to the [facility].  Identity was confirmed.  All relevant records and images related to the planned course of therapy were reviewed.  The patient freely provided informed written consent to proceed with treatment after reviewing the details related to the planned course of therapy. The consent form was witnessed and verified by the simulation staff.  Then, the patient was set-up in a stable reproducible  supine position for radiation therapy.  CT images were obtained.  Surface markings were placed.  The CT images were loaded into the planning software.  Then the target and avoidance structures were contoured.  Treatment planning then occurred.  The radiation prescription was entered and confirmed.  Then, I designed and supervised the construction of a total of 5 medically necessary complex treatment devices, MLC apertures shielding critical structures.  I have requested : 3D Simulation  I have requested a DVH of the following structures: heart, lungs, spinal cord, brachial plexust.  I have ordered:Nutrition Consult  SPECIAL TREATMENT PROCEDURE:  The planned course of therapy using radiation constitutes a special treatment procedure. Special care is required in the management of this patient for the following reasons. Concurrent chemotherapy requiring careful monitoring for increased toxicities of treatment including weekly laboratory values. The special nature of the planned course of radiotherapy will require increased physician supervision and oversight to ensure patient's safety with optimal treatment outcomes.  PLAN:  The patient will receive up to 66 Gy in 33  fractions depending on response.  If his cone beam CT image guidance indicates interim regression of tumor, may stop at 46 Gy for pre-op intent.  If no, response, may consider proceeding to definitive dose pending discussion with Dr. Julien Nordmann and Thoracic surgery.  ________________________________  Sheral Apley Tammi Klippel, M.D.

## 2013-05-15 ENCOUNTER — Ambulatory Visit
Admission: RE | Admit: 2013-05-15 | Discharge: 2013-05-15 | Disposition: A | Discharge status: Home / Self Care | Payer: Medicare Other | Source: Ambulatory Visit | Attending: Radiation Oncology | Admitting: Radiation Oncology

## 2013-05-15 VITALS — BP 116/61 | HR 81 | Temp 98.0°F | Resp 16 | Ht 73.0 in | Wt 219.0 lb

## 2013-05-15 DIAGNOSIS — R634 Abnormal weight loss: Secondary | ICD-10-CM | POA: Insufficient documentation

## 2013-05-15 DIAGNOSIS — R197 Diarrhea, unspecified: Secondary | ICD-10-CM | POA: Insufficient documentation

## 2013-05-15 DIAGNOSIS — R5383 Other fatigue: Secondary | ICD-10-CM

## 2013-05-15 DIAGNOSIS — R112 Nausea with vomiting, unspecified: Secondary | ICD-10-CM | POA: Insufficient documentation

## 2013-05-15 DIAGNOSIS — Z510 Encounter for antineoplastic radiation therapy: Secondary | ICD-10-CM | POA: Insufficient documentation

## 2013-05-15 DIAGNOSIS — R079 Chest pain, unspecified: Secondary | ICD-10-CM | POA: Insufficient documentation

## 2013-05-15 DIAGNOSIS — R5381 Other malaise: Secondary | ICD-10-CM | POA: Insufficient documentation

## 2013-05-15 DIAGNOSIS — C3410 Malignant neoplasm of upper lobe, unspecified bronchus or lung: Secondary | ICD-10-CM | POA: Insufficient documentation

## 2013-05-15 DIAGNOSIS — R05 Cough: Secondary | ICD-10-CM | POA: Insufficient documentation

## 2013-05-15 DIAGNOSIS — K5900 Constipation, unspecified: Secondary | ICD-10-CM | POA: Insufficient documentation

## 2013-05-15 DIAGNOSIS — R6889 Other general symptoms and signs: Secondary | ICD-10-CM | POA: Insufficient documentation

## 2013-05-15 DIAGNOSIS — R059 Cough, unspecified: Secondary | ICD-10-CM | POA: Insufficient documentation

## 2013-05-15 DIAGNOSIS — R630 Anorexia: Secondary | ICD-10-CM | POA: Insufficient documentation

## 2013-05-15 DIAGNOSIS — J029 Acute pharyngitis, unspecified: Secondary | ICD-10-CM | POA: Insufficient documentation

## 2013-05-15 DIAGNOSIS — M25519 Pain in unspecified shoulder: Secondary | ICD-10-CM | POA: Insufficient documentation

## 2013-05-15 DIAGNOSIS — M549 Dorsalgia, unspecified: Secondary | ICD-10-CM | POA: Insufficient documentation

## 2013-05-15 DIAGNOSIS — R0602 Shortness of breath: Secondary | ICD-10-CM | POA: Insufficient documentation

## 2013-05-17 ENCOUNTER — Telehealth: Payer: Self-pay | Admitting: *Deleted

## 2013-05-17 NOTE — Telephone Encounter (Signed)
Called and spoke with patient to see how he was feeling.  He stated he was feeling ok.  I asked how the fatigue was.  He said it was "ok".  I listened as he explained.  I asked if he would like physical therapy to help with fatigue.  He stated not at this time.  He was riding a stationary bike and was doing fine.  I stated if he needed assistance to call if needed.

## 2013-05-20 ENCOUNTER — Ambulatory Visit (HOSPITAL_BASED_OUTPATIENT_CLINIC_OR_DEPARTMENT_OTHER): Payer: Medicare Other

## 2013-05-20 ENCOUNTER — Encounter: Payer: Self-pay | Admitting: *Deleted

## 2013-05-20 ENCOUNTER — Telehealth: Payer: Self-pay | Admitting: *Deleted

## 2013-05-20 ENCOUNTER — Other Ambulatory Visit: Payer: Self-pay | Admitting: Internal Medicine

## 2013-05-20 ENCOUNTER — Ambulatory Visit
Admission: RE | Admit: 2013-05-20 | Discharge: 2013-05-20 | Disposition: A | Discharge status: Home / Self Care | Payer: Medicare Other | Source: Ambulatory Visit | Attending: Radiation Oncology | Admitting: Radiation Oncology

## 2013-05-20 ENCOUNTER — Other Ambulatory Visit (HOSPITAL_BASED_OUTPATIENT_CLINIC_OR_DEPARTMENT_OTHER): Payer: Medicare Other

## 2013-05-20 VITALS — BP 115/65 | HR 71 | Temp 98.0°F | Resp 18

## 2013-05-20 DIAGNOSIS — C3410 Malignant neoplasm of upper lobe, unspecified bronchus or lung: Secondary | ICD-10-CM

## 2013-05-20 DIAGNOSIS — C3490 Malignant neoplasm of unspecified part of unspecified bronchus or lung: Secondary | ICD-10-CM

## 2013-05-20 DIAGNOSIS — Z5111 Encounter for antineoplastic chemotherapy: Secondary | ICD-10-CM

## 2013-05-20 LAB — CBC WITH DIFFERENTIAL/PLATELET
BASO%: 0.3 % (ref 0.0–2.0)
BASOS ABS: 0 10*3/uL (ref 0.0–0.1)
EOS%: 0.6 % (ref 0.0–7.0)
Eosinophils Absolute: 0.1 10*3/uL (ref 0.0–0.5)
HCT: 42.2 % (ref 38.4–49.9)
HGB: 13.7 g/dL (ref 13.0–17.1)
LYMPH#: 1.4 10*3/uL (ref 0.9–3.3)
LYMPH%: 17.4 % (ref 14.0–49.0)
MCH: 29.7 pg (ref 27.2–33.4)
MCHC: 32.5 g/dL (ref 32.0–36.0)
MCV: 91.3 fL (ref 79.3–98.0)
MONO#: 1 10*3/uL — AB (ref 0.1–0.9)
MONO%: 12.6 % (ref 0.0–14.0)
NEUT%: 69.1 % (ref 39.0–75.0)
NEUTROS ABS: 5.5 10*3/uL (ref 1.5–6.5)
NRBC: 0 % (ref 0–0)
Platelets: 370 10*3/uL (ref 140–400)
RBC: 4.62 10*6/uL (ref 4.20–5.82)
RDW: 14.4 % (ref 11.0–14.6)
WBC: 8 10*3/uL (ref 4.0–10.3)

## 2013-05-20 LAB — COMPREHENSIVE METABOLIC PANEL (CC13)
ALK PHOS: 121 U/L (ref 40–150)
ALT: 26 U/L (ref 0–55)
AST: 27 U/L (ref 5–34)
Albumin: 3.4 g/dL — ABNORMAL LOW (ref 3.5–5.0)
Anion Gap: 12 mEq/L — ABNORMAL HIGH (ref 3–11)
BUN: 23.9 mg/dL (ref 7.0–26.0)
CHLORIDE: 104 meq/L (ref 98–109)
CO2: 24 mEq/L (ref 22–29)
Calcium: 11.9 mg/dL — ABNORMAL HIGH (ref 8.4–10.4)
Creatinine: 1.2 mg/dL (ref 0.7–1.3)
GLUCOSE: 107 mg/dL (ref 70–140)
POTASSIUM: 3.8 meq/L (ref 3.5–5.1)
SODIUM: 140 meq/L (ref 136–145)
Total Bilirubin: 0.44 mg/dL (ref 0.20–1.20)
Total Protein: 8.4 g/dL — ABNORMAL HIGH (ref 6.4–8.3)

## 2013-05-20 MED ORDER — DIPHENHYDRAMINE HCL 50 MG/ML IJ SOLN
50.0000 mg | Freq: Once | INTRAMUSCULAR | Status: AC
  Administered 2013-05-20: 50 mg via INTRAVENOUS

## 2013-05-20 MED ORDER — DIPHENHYDRAMINE HCL 50 MG/ML IJ SOLN
INTRAMUSCULAR | Status: AC
  Filled 2013-05-20: qty 1

## 2013-05-20 MED ORDER — DEXAMETHASONE SODIUM PHOSPHATE 20 MG/5ML IJ SOLN
20.0000 mg | Freq: Once | INTRAMUSCULAR | Status: AC
  Administered 2013-05-20: 20 mg via INTRAVENOUS

## 2013-05-20 MED ORDER — FAMOTIDINE IN NACL 20-0.9 MG/50ML-% IV SOLN
20.0000 mg | Freq: Once | INTRAVENOUS | Status: AC
  Administered 2013-05-20: 20 mg via INTRAVENOUS

## 2013-05-20 MED ORDER — ONDANSETRON 16 MG/50ML IVPB (CHCC)
INTRAVENOUS | Status: AC
  Filled 2013-05-20: qty 16

## 2013-05-20 MED ORDER — PACLITAXEL CHEMO INJECTION 300 MG/50ML
45.0000 mg/m2 | Freq: Once | INTRAVENOUS | Status: AC
  Administered 2013-05-20: 102 mg via INTRAVENOUS
  Filled 2013-05-20: qty 17

## 2013-05-20 MED ORDER — SODIUM CHLORIDE 0.9 % IV SOLN
206.8000 mg | Freq: Once | INTRAVENOUS | Status: AC
  Administered 2013-05-20: 210 mg via INTRAVENOUS
  Filled 2013-05-20: qty 21

## 2013-05-20 MED ORDER — FAMOTIDINE IN NACL 20-0.9 MG/50ML-% IV SOLN
INTRAVENOUS | Status: AC
  Filled 2013-05-20: qty 50

## 2013-05-20 MED ORDER — ONDANSETRON 16 MG/50ML IVPB (CHCC)
16.0000 mg | Freq: Once | INTRAVENOUS | Status: AC
  Administered 2013-05-20: 16 mg via INTRAVENOUS

## 2013-05-20 MED ORDER — DEXAMETHASONE SODIUM PHOSPHATE 20 MG/5ML IJ SOLN
INTRAMUSCULAR | Status: AC
  Filled 2013-05-20: qty 5

## 2013-05-20 MED ORDER — SODIUM CHLORIDE 0.9 % IV SOLN
Freq: Once | INTRAVENOUS | Status: AC
  Administered 2013-05-20: 14:00:00 via INTRAVENOUS

## 2013-05-20 NOTE — Progress Notes (Signed)
  Radiation Oncology         [phone] ________________________________  Name: Terry Robles MRN: [id number]  Date: 05/20/2013  DOB: Oct 12, 1940  Simulation Verification Note  Status: outpatient  NARRATIVE: The patient was brought to the treatment unit and placed in the planned treatment position. The clinical setup was verified. Then port films were obtained and uploaded to the radiation oncology medical record software.  The treatment beams were carefully compared against the planned radiation fields. The position location and shape of the radiation fields was reviewed. They targeted volume of tissue appears to be appropriately covered by the radiation beams. Organs at risk appear to be excluded as planned.  Based on my personal review, I approved the simulation verification. The patient's treatment will proceed as planned.  ------------------------------------------------  Sheral Apley Terry Robles, M.D.

## 2013-05-20 NOTE — Patient Instructions (Addendum)
[facility] Discharge Instructions for Patients Receiving Chemotherapy  Today you received the following chemotherapy agents TAXOL, CARBOPLATIN  To help prevent nausea and vomiting after your treatment, we encourage you to take your nausea medication MAY START ABOUT 830 PM AS NEEDED   If you develop nausea and vomiting that is not controlled by your nausea medication, call the clinic.   BELOW ARE SYMPTOMS THAT SHOULD BE REPORTED IMMEDIATELY:  *FEVER GREATER THAN 100.5 F  *CHILLS WITH OR WITHOUT FEVER  NAUSEA AND VOMITING THAT IS NOT CONTROLLED WITH YOUR NAUSEA MEDICATION  *UNUSUAL SHORTNESS OF BREATH  *UNUSUAL BRUISING OR BLEEDING  TENDERNESS IN MOUTH AND THROAT WITH OR WITHOUT PRESENCE OF ULCERS  *URINARY PROBLEMS  *BOWEL PROBLEMS  UNUSUAL RASH Items with * indicate a potential emergency and should be followed up as soon as possible.  Feel free to call the clinic should you have any questions or concerns. The clinic phone number is [phone].
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
## 2013-05-20 NOTE — Progress Notes (Signed)
Pt stated he had vomiting from about 8 am to midnight yesterday, had about 6 diarrhea stools. He is tired but feels fine now. He also reports a temp of 101. S/w Dr Julien Nordmann and he said OK to treat.

## 2013-05-20 NOTE — Telephone Encounter (Signed)
Pt. Called left vm message.  I called but unable to reach phone busy.

## 2013-05-20 NOTE — Telephone Encounter (Signed)
Called home phone to speak to pt.  Wife answered and I gave her message for pt to call me if needed.

## 2013-05-21 ENCOUNTER — Telehealth: Payer: Self-pay | Admitting: *Deleted

## 2013-05-21 ENCOUNTER — Ambulatory Visit
Admission: RE | Admit: 2013-05-21 | Discharge: 2013-05-21 | Disposition: A | Discharge status: Home / Self Care | Payer: Medicare Other | Source: Ambulatory Visit | Attending: Radiation Oncology | Admitting: Radiation Oncology

## 2013-05-21 ENCOUNTER — Other Ambulatory Visit: Payer: Self-pay | Admitting: Internal Medicine

## 2013-05-21 DIAGNOSIS — C3490 Malignant neoplasm of unspecified part of unspecified bronchus or lung: Secondary | ICD-10-CM

## 2013-05-21 MED ORDER — PROCHLORPERAZINE MALEATE 10 MG PO TABS: 10.0000 mg | ORAL_TABLET | Freq: Four times a day (QID) | ORAL | Status: DC | PRN

## 2013-05-21 MED ORDER — OXYCODONE-ACETAMINOPHEN 5-325 MG PO TABS: 1.0000 | ORAL_TABLET | Freq: Four times a day (QID) | ORAL | Status: DC | PRN

## 2013-05-21 NOTE — Telephone Encounter (Signed)
Message copied by Cherylynn Ridges on Tue May 21, 2013 11:59 AM ------      Message from: Janace Hoard      Created: Mon May 20, 2013  4:22 PM      Regarding: chemo follow up call      Contact: [phone]       Taxol, carboplatin, Dr Julien Nordmann ------

## 2013-05-21 NOTE — Telephone Encounter (Signed)
Called Terry Robles for chemotherapy F/U.  Patient is doing well.  Denies n/v.  Denies any new side effects or symptoms.  Bowel and bladder is functioning well.  Eating and drinking well and I instructed to drink 64 oz minimum daily or at least the day before, of and after treatment.  Denies questions at this time and encouraged to call if needed.  Reviewed how to call after hours in the case of an emergency.

## 2013-05-22 ENCOUNTER — Ambulatory Visit
Admission: RE | Admit: 2013-05-22 | Discharge: 2013-05-22 | Disposition: A | Discharge status: Home / Self Care | Payer: Medicare Other | Source: Ambulatory Visit | Attending: Radiation Oncology | Admitting: Radiation Oncology

## 2013-05-23 ENCOUNTER — Ambulatory Visit
Admission: RE | Admit: 2013-05-23 | Discharge: 2013-05-23 | Disposition: A | Discharge status: Home / Self Care | Payer: Medicare Other | Source: Ambulatory Visit | Attending: Radiation Oncology | Admitting: Radiation Oncology

## 2013-05-24 ENCOUNTER — Ambulatory Visit
Admission: RE | Admit: 2013-05-24 | Discharge: 2013-05-24 | Disposition: A | Discharge status: Home / Self Care | Payer: Medicare Other | Source: Ambulatory Visit | Attending: Radiation Oncology | Admitting: Radiation Oncology

## 2013-05-24 ENCOUNTER — Encounter: Payer: Self-pay | Admitting: Radiation Oncology

## 2013-05-24 VITALS — BP 123/69 | HR 93 | Resp 18 | Wt 214.0 lb

## 2013-05-24 DIAGNOSIS — C3410 Malignant neoplasm of upper lobe, unspecified bronchus or lung: Secondary | ICD-10-CM

## 2013-05-24 NOTE — Progress Notes (Signed)
Reports that he has been alternating between episodes of constipation and diarrhea. Patient taking tylenol 3 for pain but, was taking oxycodone. Advised patient pain medication slow GI peristalsis and encouraged a colace bid while take pain medication. Patient reports he has diarrhea when he take senokot s. Explain this has a stimulate in it and the result would be diarrhea. Encouraged a BRAT diet and stool softener. Patient verbalized understanding. Reports projectile vomiting and persistent nausea. Encouraged patient to take compazine every six hours instead of just once per day. Reports an occasional dry cough. Reports shortness of breath with exertion that is no worse. Reports decreased appetite. 5 lb weight loss noted over a weeks time. Reports pain in right shoulder 2 on a scale of 0-10. Reports taking tylenol 3 thirty minutes prior to treatment.

## 2013-05-24 NOTE — Progress Notes (Signed)
  Radiation Oncology         [phone] ________________________________  Name: Terry Robles MRN: [id number]  Date: 05/24/2013  DOB: 09/25/1940  Weekly Radiation Therapy Management  Current Dose: 10 Gy     Planned Dose:  66 Gy  Narrative . . . . . . . . The patient presents for routine under treatment assessment.                                  Reports that he has been alternating between episodes of constipation and diarrhea. Patient taking tylenol 3 for pain but, was taking oxycodone. Advised patient pain medication slow GI peristalsis and encouraged a colace bid while take pain medication. Patient reports he has diarrhea when he take senokot s. Explain this has a stimulate in it and the result would be diarrhea. Encouraged a BRAT diet and stool softener. Patient verbalized understanding. Reports projectile vomiting and persistent nausea. Encouraged patient to take compazine every six hours instead of just once per day. Reports an occasional dry cough. Reports shortness of breath with exertion that is no worse. Reports decreased appetite. 5 lb weight loss noted over a weeks time. Reports pain in right shoulder 2 on a scale of 0-10. Reports taking tylenol 3 thirty minutes prior to treatment  The patient is without complaint.                                 Set-up films were reviewed.                                 The chart was checked. Physical Findings. . .  weight is 214 lb (97.07 kg). His blood pressure is 123/69 and his pulse is 93. His respiration is 18 and oxygen saturation is 99%. . Weight essentially stable.  No significant changes. Impression . . . . . . . The patient is tolerating radiation. Plan . . . . . . . . . . . . Continue treatment as planned.  ________________________________  Sheral Apley. Tammi Klippel, M.D.

## 2013-05-25 ENCOUNTER — Encounter: Payer: Self-pay | Admitting: Radiation Oncology

## 2013-05-27 ENCOUNTER — Other Ambulatory Visit (HOSPITAL_BASED_OUTPATIENT_CLINIC_OR_DEPARTMENT_OTHER): Payer: Medicare Other

## 2013-05-27 ENCOUNTER — Ambulatory Visit (HOSPITAL_BASED_OUTPATIENT_CLINIC_OR_DEPARTMENT_OTHER): Payer: Medicare Other | Admitting: Physician Assistant

## 2013-05-27 ENCOUNTER — Ambulatory Visit (HOSPITAL_BASED_OUTPATIENT_CLINIC_OR_DEPARTMENT_OTHER): Payer: Medicare Other

## 2013-05-27 ENCOUNTER — Encounter: Payer: Self-pay | Admitting: Physician Assistant

## 2013-05-27 ENCOUNTER — Ambulatory Visit
Admission: RE | Admit: 2013-05-27 | Discharge: 2013-05-27 | Disposition: A | Discharge status: Home / Self Care | Payer: Medicare Other | Source: Ambulatory Visit | Attending: Radiation Oncology | Admitting: Radiation Oncology

## 2013-05-27 VITALS — BP 107/52 | HR 78 | Temp 98.6°F | Resp 18 | Ht 73.0 in | Wt 212.3 lb

## 2013-05-27 DIAGNOSIS — C3410 Malignant neoplasm of upper lobe, unspecified bronchus or lung: Secondary | ICD-10-CM

## 2013-05-27 DIAGNOSIS — C3490 Malignant neoplasm of unspecified part of unspecified bronchus or lung: Secondary | ICD-10-CM

## 2013-05-27 DIAGNOSIS — Z5111 Encounter for antineoplastic chemotherapy: Secondary | ICD-10-CM

## 2013-05-27 LAB — CBC WITH DIFFERENTIAL/PLATELET
BASO%: 0.6 % (ref 0.0–2.0)
Basophils Absolute: 0 10*3/uL (ref 0.0–0.1)
EOS%: 2.8 % (ref 0.0–7.0)
Eosinophils Absolute: 0.2 10*3/uL (ref 0.0–0.5)
HCT: 36.5 % — ABNORMAL LOW (ref 38.4–49.9)
HGB: 12.1 g/dL — ABNORMAL LOW (ref 13.0–17.1)
LYMPH%: 19.3 % (ref 14.0–49.0)
MCH: 29.8 pg (ref 27.2–33.4)
MCHC: 33.2 g/dL (ref 32.0–36.0)
MCV: 89.9 fL (ref 79.3–98.0)
MONO#: 0.8 10*3/uL (ref 0.1–0.9)
MONO%: 10.7 % (ref 0.0–14.0)
NEUT%: 66.6 % (ref 39.0–75.0)
NEUTROS ABS: 4.7 10*3/uL (ref 1.5–6.5)
Platelets: 339 10*3/uL (ref 140–400)
RBC: 4.06 10*6/uL — AB (ref 4.20–5.82)
RDW: 13.8 % (ref 11.0–14.6)
WBC: 7 10*3/uL (ref 4.0–10.3)
lymph#: 1.4 10*3/uL (ref 0.9–3.3)
nRBC: 0 % (ref 0–0)

## 2013-05-27 LAB — COMPREHENSIVE METABOLIC PANEL (CC13)
ALT: 24 U/L (ref 0–55)
AST: 19 U/L (ref 5–34)
Albumin: 3 g/dL — ABNORMAL LOW (ref 3.5–5.0)
Alkaline Phosphatase: 120 U/L (ref 40–150)
Anion Gap: 7 mEq/L (ref 3–11)
BILIRUBIN TOTAL: 0.37 mg/dL (ref 0.20–1.20)
BUN: 13.6 mg/dL (ref 7.0–26.0)
CO2: 25 mEq/L (ref 22–29)
CREATININE: 0.8 mg/dL (ref 0.7–1.3)
Calcium: 10.6 mg/dL — ABNORMAL HIGH (ref 8.4–10.4)
Chloride: 106 mEq/L (ref 98–109)
Glucose: 93 mg/dl (ref 70–140)
Potassium: 3.8 mEq/L (ref 3.5–5.1)
SODIUM: 138 meq/L (ref 136–145)
TOTAL PROTEIN: 7.1 g/dL (ref 6.4–8.3)

## 2013-05-27 MED ORDER — CARBOPLATIN CHEMO INJECTION 450 MG/45ML
206.8000 mg | Freq: Once | INTRAVENOUS | Status: AC
  Administered 2013-05-27: 210 mg via INTRAVENOUS
  Filled 2013-05-27: qty 21

## 2013-05-27 MED ORDER — DIPHENHYDRAMINE HCL 50 MG/ML IJ SOLN
INTRAMUSCULAR | Status: AC
  Filled 2013-05-27: qty 1

## 2013-05-27 MED ORDER — SODIUM CHLORIDE 0.9 % IV SOLN
Freq: Once | INTRAVENOUS | Status: AC
  Administered 2013-05-27: 13:00:00 via INTRAVENOUS

## 2013-05-27 MED ORDER — ONDANSETRON 16 MG/50ML IVPB (CHCC)
INTRAVENOUS | Status: AC
  Filled 2013-05-27: qty 16

## 2013-05-27 MED ORDER — DEXAMETHASONE SODIUM PHOSPHATE 20 MG/5ML IJ SOLN
INTRAMUSCULAR | Status: AC
  Filled 2013-05-27: qty 5

## 2013-05-27 MED ORDER — SODIUM CHLORIDE 0.9 % IV SOLN
45.0000 mg/m2 | Freq: Once | INTRAVENOUS | Status: AC
  Administered 2013-05-27: 102 mg via INTRAVENOUS
  Filled 2013-05-27: qty 17

## 2013-05-27 MED ORDER — DIPHENHYDRAMINE HCL 50 MG/ML IJ SOLN
50.0000 mg | Freq: Once | INTRAMUSCULAR | Status: AC
  Administered 2013-05-27: 50 mg via INTRAVENOUS

## 2013-05-27 MED ORDER — DEXAMETHASONE SODIUM PHOSPHATE 20 MG/5ML IJ SOLN
20.0000 mg | Freq: Once | INTRAMUSCULAR | Status: AC
  Administered 2013-05-27: 20 mg via INTRAVENOUS

## 2013-05-27 MED ORDER — ONDANSETRON 16 MG/50ML IVPB (CHCC)
16.0000 mg | Freq: Once | INTRAVENOUS | Status: AC
  Administered 2013-05-27: 16 mg via INTRAVENOUS

## 2013-05-27 MED ORDER — FAMOTIDINE IN NACL 20-0.9 MG/50ML-% IV SOLN
INTRAVENOUS | Status: AC
  Filled 2013-05-27: qty 50

## 2013-05-27 MED ORDER — FAMOTIDINE IN NACL 20-0.9 MG/50ML-% IV SOLN
20.0000 mg | Freq: Once | INTRAVENOUS | Status: AC
  Administered 2013-05-27: 20 mg via INTRAVENOUS

## 2013-05-27 NOTE — Progress Notes (Addendum)
No images are attached to the encounter. No scans are attached to the encounter. No scans are attached to the encounter. 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   INTERVAL HISTORY: Terry Robles 73 y.o. male returns for a scheduled regular symptom management visit for followup of his recently diagnosed stage IIIa (T3, N2, M0) non-small cell lung cancer, squamous carcinoma presenting with right Pancoast tumor. He is currently undergoing a course of concurrent chemoradiation. Overall he is tolerating his treatment relatively well with the exception of some fatigue, nausea vomiting and diarrhea and constipation. Over the last 4-5 days he felt significantly better with none of the previous symptoms. His appetite is fair. He is drinking about 1-1/2 quarts of water daily he  is trying to work himself up to the 2 quarts daily. He voiced no specific complaints today. He presents to proceed with his second week of weekly chemotherapy concurrent with his radiation therapy. He denied fever, chills, cough or any change in his baseline shortness of breath. Denied any significant weight loss or night sweats.   MEDICAL HISTORY: Past Medical History  Diagnosis Date  . Hypercholesteremia 05/14/2011  . BPH (benign prostatic hyperplasia) 05/14/2011    ALLERGIES:  has No Known Allergies.  MEDICATIONS:  Current Outpatient Prescriptions  Medication Sig Dispense Refill  . Multiple Vitamin (MULITIVITAMIN WITH MINERALS) TABS Take 1 tablet by mouth daily.      Marland Kitchen oxyCODONE-acetaminophen (PERCOCET/ROXICET) 5-325 MG per tablet Take 1 tablet by mouth every 6 (six) hours as needed for severe pain.  60 tablet  0  . prochlorperazine (COMPAZINE) 10 MG tablet Take 1 tablet (10 mg total) by mouth every 6 (six) hours as needed for nausea or vomiting.  60 tablet  1  . Tamsulosin HCl (FLOMAX) 0.4 MG CAPS Take 0.4 mg by mouth daily after supper.      . diphenhydramine-acetaminophen (TYLENOL PM) 25-500 MG TABS Take 1 tablet by mouth at bedtime as needed.       No current facility-administered medications for this visit.    SURGICAL HISTORY:  Past Surgical History  Procedure Laterality Date  . Tonsillectomy    . Other surgical history      benign tumor removed from left leg several years ago  . Other surgical history      had melanoma/squamous cell removed  previously per patient    REVIEW OF SYSTEMS:  Constitutional: positive for fatigue Eyes: negative Ears, nose, mouth, throat, and face: negative Respiratory: positive for dyspnea on exertion Cardiovascular: negative Gastrointestinal: positive for constipation, diarrhea, nausea and vomiting Genitourinary:negative Integument/breast: negative Hematologic/lymphatic: negative Musculoskeletal:negative Neurological:  negative Behavioral/Psych: negative Endocrine: negative Allergic/Immunologic: negative   PHYSICAL EXAMINATION: General appearance: alert, cooperative, appears stated age and no distress Head: Normocephalic, without obvious abnormality, atraumatic Neck: no adenopathy, no carotid bruit, no JVD, supple, symmetrical, trachea midline and thyroid not enlarged, symmetric, no tenderness/mass/nodules Lymph nodes: Cervical, supraclavicular, and axillary nodes normal. Resp: clear to auscultation bilaterally Back: symmetric, no curvature. ROM normal. No CVA tenderness. Cardio: regular rate and rhythm, S1, S2 normal, no murmur, click, rub or gallop GI: soft, non-tender; bowel sounds normal; no masses,  no organomegaly Extremities: extremities normal, atraumatic, no cyanosis or edema Neurologic: Alert and oriented X 3, normal strength and tone. Normal symmetric reflexes. Normal coordination and gait  ECOG PERFORMANCE STATUS: 1 - Symptomatic but completely ambulatory  Blood pressure 107/52, pulse 78, temperature 98.6 F (37 C), temperature source Oral, resp. rate 18, height 6\' 1"  (1.854 m), weight 212 lb 4.8 oz (96.299 kg), SpO2 99.00%.  LABORATORY DATA: Lab Results  Component Value Date   WBC 7.0 05/27/2013   HGB 12.1* 05/27/2013   HCT 36.5* 05/27/2013   MCV 89.9 05/27/2013   PLT 339 05/27/2013      Chemistry      Component Value Date/Time   NA 138 05/27/2013 1123   NA 135 05/15/2011 0630   K 3.8 05/27/2013 1123   K 3.7 05/15/2011 0630   CL 102 05/15/2011 0630   CO2 25 05/27/2013 1123   CO2 26 05/15/2011 0630   BUN 13.6 05/27/2013 1123   BUN 13 05/15/2011 0630   CREATININE 0.8 05/27/2013 1123   CREATININE 1.10 04/12/2013 0816      Component Value Date/Time   CALCIUM 10.6* 05/27/2013 1123   CALCIUM 8.9 05/15/2011 0630   ALKPHOS 120 05/27/2013 1123   ALKPHOS 71 05/15/2011 0630   AST 19 05/27/2013 1123   AST 38* 05/15/2011 0630   ALT 24 05/27/2013 1123   ALT 46 05/15/2011 0630   BILITOT 0.37 05/27/2013 1123   BILITOT  1.4* 05/15/2011 0630       RADIOGRAPHIC STUDIES:  Dg Chest 1 View  04/30/2013   CLINICAL DATA:  Right upper lobe mass, post percutaneous biopsy  EXAM: CHEST - 1 VIEW  COMPARISON:  04/25/2013 and earlier studies  FINDINGS: No pneumothorax. Stable right upper lobe/apical mass with probable pathologic fracture through the posterior aspect right second rib. Left lung clear. Heart size normal. No effusion.  IMPRESSION: No pneumothorax post right lung biopsy   Electronically Signed   By: Arne Cleveland M.D.   On: 04/30/2013 13:12   Mr Jeri Cos ZT Contrast  05/14/2013   CLINICAL DATA:  Headaches. Staging of new non-small-cell lung cancer.  EXAM: MRI HEAD WITHOUT AND WITH CONTRAST  TECHNIQUE: Multiplanar, multiecho pulse sequences of the brain and surrounding structures were obtained without and with intravenous contrast.  CONTRAST:  36mL MULTIHANCE GADOBENATE DIMEGLUMINE 529 MG/ML IV SOLN  COMPARISON:  None.  FINDINGS: Incidental note is made of a partially empty sella. There is no acute infarct. There is no evidence of intracranial hemorrhage. Ventricles and sulci are within normal limits for age. Small, scattered foci of T2 hyperintensity within the deep cerebral white matter are nonspecific but compatible with minimal, age-related small vessel ischemic change. There  is no mass, midline shift or extra-axial fluid collection. There is no abnormal enhancement.  Major intracranial vascular flow voids are unremarkable. Orbits are unremarkable. Paranasal sinuses and mastoid air cells are clear. Calvarium is normal in signal.  IMPRESSION: No evidence of intracranial metastases.   Electronically Signed   By: Logan Bores   On: 05/14/2013 15:07   Ct Biopsy  04/30/2013   CLINICAL DATA:  Hypermetabolic right apical lung lesion with chest wall involvement  EXAM: CT GUIDED CORE BIOPSY OF RIGHT UPPER LOBE LUNG LESION  ANESTHESIA/SEDATION: Intravenous Fentanyl and Versed were administered as conscious sedation during  continuous cardiorespiratory monitoring by the radiology RN, with a total moderate sedation time of 7 minutes.  PROCEDURE: The procedure risks, benefits, and alternatives were explained to the patient. Questions regarding the procedure were encouraged and answered. The patient understands and consents to the procedure.  The posterior right upper chest was prepped with betadinein a sterile fashion, and a sterile drape was applied covering the operative field. A sterile gown and sterile gloves were used for the procedure. Local anesthesia was provided with 1% Lidocaine.  Under CT fluoroscopic guidance, a 17 gauge trocar needle was advanced to the margin of the lesion. Once needle tip position was confirmed, coaxial 18-gauge core biopsy samples were obtained, submitted in formalin to surgical pathology. The guide needle was removed. Postprocedure scans show no pneumothorax or hemorrhage. The patient tolerated the procedure well.  Complications: none  FINDINGS: Right apical lung mass with posterior chest wall involvement. Technically successful core biopsy as above.  IMPRESSION: 1. Technically successful right upper lobe lung core needle biopsy. Observation and follow-up radiography scheduled.   Electronically Signed   By: Arne Cleveland M.D.   On: 04/30/2013 13:35     ASSESSMENT/PLAN: Mr. brain is a very pleasant 73 year old Caucasian male recently diagnosed with stage IIIa (T3, N2, M0) non-small cell lung cancer, squamous cell carcinoma presenting with right Pancoast tumor diagnosed in February 2015. He is currently undergoing a course of concurrent chemoradiation. Overall is tolerating his treatment relatively well with the exception of some intermittent nausea, vomiting diarrhea constipation. All these symptoms have subsided. He has some mild fatigue as well. Patient was discussed with and also seen by Dr. Julien Nordmann. He will continue with his course of concurrent chemoradiation as scheduled. He'll return for  another symptom management visit in 3 weeks     Wynetta Emery, Rameses Ou E, PA-C  All questions were answered. The patient knows to call the clinic with any problems, questions or concerns. We can certainly see the patient much sooner if necessary.  ADDENDUM: Hematology/Oncology Attending: I had a face to face encounter with the patient today. I recommended his care plan. This is a very pleasant 73 years old white male recently diagnosed with a stage IIIa non-small cell lung cancer. He is currently undergoing concurrent chemoradiation with weekly carboplatin and paclitaxel is status post 1 cycle. He tolerated the first cycle of his treatment fairly well except for occasional nausea. He denied having any other significant complaints. I recommended for the patient to proceed with his treatment today as scheduled. He would come back for followup visit in 3 weeks for reevaluation and management any adverse effect of his treatment. He was advised to call immediately if he has any concerning symptoms in the interval.  Disclaimer: This note was dictated with voice recognition software. Similar sounding words can inadvertently be transcribed and may not be corrected upon review.

## 2013-05-27 NOTE — Patient Instructions (Signed)
Continue her course of concurrent chemoradiation as scheduled Followup in 3 weeks for another symptom management visit

## 2013-05-27 NOTE — Patient Instructions (Signed)
[facility] Discharge Instructions for Patients Receiving Chemotherapy  Today you received the following chemotherapy agents Taxol and Carboplatin  To help prevent nausea and vomiting after your treatment, we encourage you to take your nausea medication Compazine 10 mg every 6 hours as needed.   If you develop nausea and vomiting that is not controlled by your nausea medication, call the clinic.   BELOW ARE SYMPTOMS THAT SHOULD BE REPORTED IMMEDIATELY:  *FEVER GREATER THAN 100.5 F  *CHILLS WITH OR WITHOUT FEVER  NAUSEA AND VOMITING THAT IS NOT CONTROLLED WITH YOUR NAUSEA MEDICATION  *UNUSUAL SHORTNESS OF BREATH  *UNUSUAL BRUISING OR BLEEDING  TENDERNESS IN MOUTH AND THROAT WITH OR WITHOUT PRESENCE OF ULCERS  *URINARY PROBLEMS  *BOWEL PROBLEMS  UNUSUAL RASH Items with * indicate a potential emergency and should be followed up as soon as possible.  Feel free to call the clinic you have any questions or concerns. The clinic phone number is [phone].

## 2013-05-28 ENCOUNTER — Ambulatory Visit
Admission: RE | Admit: 2013-05-28 | Discharge: 2013-05-28 | Disposition: A | Discharge status: Home / Self Care | Payer: Medicare Other | Source: Ambulatory Visit | Attending: Radiation Oncology | Admitting: Radiation Oncology

## 2013-05-29 ENCOUNTER — Ambulatory Visit
Admission: RE | Admit: 2013-05-29 | Discharge: 2013-05-29 | Disposition: A | Discharge status: Home / Self Care | Payer: Medicare Other | Source: Ambulatory Visit | Attending: Radiation Oncology | Admitting: Radiation Oncology

## 2013-05-30 ENCOUNTER — Telehealth: Payer: Self-pay | Admitting: Internal Medicine

## 2013-05-30 ENCOUNTER — Ambulatory Visit
Admission: RE | Admit: 2013-05-30 | Discharge: 2013-05-30 | Disposition: A | Discharge status: Home / Self Care | Payer: Medicare Other | Source: Ambulatory Visit | Attending: Radiation Oncology | Admitting: Radiation Oncology

## 2013-05-30 NOTE — Telephone Encounter (Signed)
, °

## 2013-05-31 ENCOUNTER — Encounter: Payer: Self-pay | Admitting: Radiation Oncology

## 2013-05-31 ENCOUNTER — Ambulatory Visit
Admission: RE | Admit: 2013-05-31 | Discharge: 2013-05-31 | Disposition: A | Discharge status: Home / Self Care | Payer: Medicare Other | Source: Ambulatory Visit | Attending: Radiation Oncology | Admitting: Radiation Oncology

## 2013-05-31 VITALS — BP 115/60 | HR 96 | Resp 16 | Wt 209.9 lb

## 2013-05-31 DIAGNOSIS — C3410 Malignant neoplasm of upper lobe, unspecified bronchus or lung: Secondary | ICD-10-CM

## 2013-05-31 NOTE — Progress Notes (Signed)
Reports occasional dry raspy cough. Denies shortness of breath. Reports dull right upper most chest pain at tumor site which percocet help to dull. Denies difficulty swallowing but, does report his throat is scratchy. Reports that he had chemo on Monday and today is just beginning to feel better. Confirms mild fatigue. Very faint right upper chest hyperpigmentation without desquamation within treatment field.

## 2013-06-01 NOTE — Progress Notes (Signed)
  Radiation Oncology         [phone] ________________________________  Name: Terry Robles MRN: [id number]  Date: 05/31/2013  DOB: 1940-09-15  Weekly Radiation Therapy Management  Current Dose: 20 Gy     Planned Dose:  66 Gy  Narrative . . . . . . . . The patient presents for routine under treatment assessment.                                   The patient is without complaint.  He denies esophageal symptoms so far.                                 Set-up films were reviewed.                                 The chart was checked. Physical Findings. . .  weight is 209 lb 14.4 oz (95.21 kg). His blood pressure is 115/60 and his pulse is 96. His respiration is 16 and oxygen saturation is 100%. . Weight essentially stable.  No significant changes. Impression . . . . . . . The patient is tolerating radiation. Plan . . . . . . . . . . . . Continue treatment as planned.  ________________________________  Sheral Apley. Tammi Klippel, M.D.

## 2013-06-03 ENCOUNTER — Other Ambulatory Visit (HOSPITAL_BASED_OUTPATIENT_CLINIC_OR_DEPARTMENT_OTHER): Payer: Medicare Other

## 2013-06-03 ENCOUNTER — Ambulatory Visit (HOSPITAL_BASED_OUTPATIENT_CLINIC_OR_DEPARTMENT_OTHER): Payer: Medicare Other

## 2013-06-03 ENCOUNTER — Ambulatory Visit
Admission: RE | Admit: 2013-06-03 | Discharge: 2013-06-03 | Disposition: A | Discharge status: Home / Self Care | Payer: Medicare Other | Source: Ambulatory Visit | Attending: Radiation Oncology | Admitting: Radiation Oncology

## 2013-06-03 DIAGNOSIS — C3410 Malignant neoplasm of upper lobe, unspecified bronchus or lung: Secondary | ICD-10-CM

## 2013-06-03 DIAGNOSIS — C3490 Malignant neoplasm of unspecified part of unspecified bronchus or lung: Secondary | ICD-10-CM

## 2013-06-03 DIAGNOSIS — Z5111 Encounter for antineoplastic chemotherapy: Secondary | ICD-10-CM

## 2013-06-03 LAB — CBC WITH DIFFERENTIAL/PLATELET
BASO%: 0.3 % (ref 0.0–2.0)
Basophils Absolute: 0 10*3/uL (ref 0.0–0.1)
EOS%: 1.3 % (ref 0.0–7.0)
Eosinophils Absolute: 0.1 10*3/uL (ref 0.0–0.5)
HCT: 36.5 % — ABNORMAL LOW (ref 38.4–49.9)
HGB: 11.9 g/dL — ABNORMAL LOW (ref 13.0–17.1)
LYMPH#: 0.9 10*3/uL (ref 0.9–3.3)
LYMPH%: 12.9 % — ABNORMAL LOW (ref 14.0–49.0)
MCH: 29.9 pg (ref 27.2–33.4)
MCHC: 32.6 g/dL (ref 32.0–36.0)
MCV: 91.7 fL (ref 79.3–98.0)
MONO#: 0.8 10*3/uL (ref 0.1–0.9)
MONO%: 11.9 % (ref 0.0–14.0)
NEUT%: 73.6 % (ref 39.0–75.0)
NEUTROS ABS: 5 10*3/uL (ref 1.5–6.5)
Platelets: 260 10*3/uL (ref 140–400)
RBC: 3.98 10*6/uL — ABNORMAL LOW (ref 4.20–5.82)
RDW: 14.4 % (ref 11.0–14.6)
WBC: 6.8 10*3/uL (ref 4.0–10.3)
nRBC: 0 % (ref 0–0)

## 2013-06-03 LAB — COMPREHENSIVE METABOLIC PANEL (CC13)
ALBUMIN: 3 g/dL — AB (ref 3.5–5.0)
ALK PHOS: 174 U/L — AB (ref 40–150)
ALT: 24 U/L (ref 0–55)
AST: 18 U/L (ref 5–34)
Anion Gap: 10 mEq/L (ref 3–11)
BUN: 17.6 mg/dL (ref 7.0–26.0)
CHLORIDE: 104 meq/L (ref 98–109)
CO2: 24 mEq/L (ref 22–29)
Calcium: 10.6 mg/dL — ABNORMAL HIGH (ref 8.4–10.4)
Creatinine: 0.9 mg/dL (ref 0.7–1.3)
Glucose: 103 mg/dl (ref 70–140)
POTASSIUM: 4.3 meq/L (ref 3.5–5.1)
Sodium: 138 mEq/L (ref 136–145)
Total Bilirubin: 0.56 mg/dL (ref 0.20–1.20)
Total Protein: 7.4 g/dL (ref 6.4–8.3)

## 2013-06-03 MED ORDER — DEXAMETHASONE SODIUM PHOSPHATE 20 MG/5ML IJ SOLN
20.0000 mg | Freq: Once | INTRAMUSCULAR | Status: AC
  Administered 2013-06-03: 20 mg via INTRAVENOUS

## 2013-06-03 MED ORDER — ONDANSETRON 16 MG/50ML IVPB (CHCC)
INTRAVENOUS | Status: AC
  Filled 2013-06-03: qty 16

## 2013-06-03 MED ORDER — ONDANSETRON 16 MG/50ML IVPB (CHCC)
16.0000 mg | Freq: Once | INTRAVENOUS | Status: AC
  Administered 2013-06-03: 16 mg via INTRAVENOUS

## 2013-06-03 MED ORDER — FAMOTIDINE IN NACL 20-0.9 MG/50ML-% IV SOLN
INTRAVENOUS | Status: AC
  Filled 2013-06-03: qty 50

## 2013-06-03 MED ORDER — SODIUM CHLORIDE 0.9 % IV SOLN
45.0000 mg/m2 | Freq: Once | INTRAVENOUS | Status: AC
  Administered 2013-06-03: 102 mg via INTRAVENOUS
  Filled 2013-06-03: qty 17

## 2013-06-03 MED ORDER — SODIUM CHLORIDE 0.9 % IV SOLN
206.8000 mg | Freq: Once | INTRAVENOUS | Status: AC
  Administered 2013-06-03: 210 mg via INTRAVENOUS
  Filled 2013-06-03: qty 21

## 2013-06-03 MED ORDER — DEXAMETHASONE SODIUM PHOSPHATE 20 MG/5ML IJ SOLN
INTRAMUSCULAR | Status: AC
  Filled 2013-06-03: qty 5

## 2013-06-03 MED ORDER — SODIUM CHLORIDE 0.9 % IV SOLN
Freq: Once | INTRAVENOUS | Status: AC
  Administered 2013-06-03: 14:00:00 via INTRAVENOUS

## 2013-06-03 MED ORDER — FAMOTIDINE IN NACL 20-0.9 MG/50ML-% IV SOLN
20.0000 mg | Freq: Once | INTRAVENOUS | Status: AC
  Administered 2013-06-03: 20 mg via INTRAVENOUS

## 2013-06-03 MED ORDER — DIPHENHYDRAMINE HCL 50 MG/ML IJ SOLN
50.0000 mg | Freq: Once | INTRAMUSCULAR | Status: AC
  Administered 2013-06-03: 50 mg via INTRAVENOUS

## 2013-06-03 MED ORDER — DIPHENHYDRAMINE HCL 50 MG/ML IJ SOLN
INTRAMUSCULAR | Status: AC
  Filled 2013-06-03: qty 1

## 2013-06-04 ENCOUNTER — Ambulatory Visit
Admission: RE | Admit: 2013-06-04 | Discharge: 2013-06-04 | Disposition: A | Discharge status: Home / Self Care | Payer: Medicare Other | Source: Ambulatory Visit | Attending: Radiation Oncology | Admitting: Radiation Oncology

## 2013-06-05 ENCOUNTER — Ambulatory Visit
Admission: RE | Admit: 2013-06-05 | Discharge: 2013-06-05 | Disposition: A | Discharge status: Home / Self Care | Payer: Medicare Other | Source: Ambulatory Visit | Attending: Radiation Oncology | Admitting: Radiation Oncology

## 2013-06-06 ENCOUNTER — Ambulatory Visit (HOSPITAL_COMMUNITY)
Admission: RE | Admit: 2013-06-06 | Discharge: 2013-06-06 | Disposition: A | Discharge status: Home / Self Care | Payer: Medicare Other | Source: Ambulatory Visit | Attending: Cardiothoracic Surgery | Admitting: Cardiothoracic Surgery

## 2013-06-06 ENCOUNTER — Ambulatory Visit
Admission: RE | Admit: 2013-06-06 | Discharge: 2013-06-06 | Disposition: A | Discharge status: Home / Self Care | Payer: Medicare Other | Source: Ambulatory Visit | Attending: Radiation Oncology | Admitting: Radiation Oncology

## 2013-06-06 ENCOUNTER — Ambulatory Visit (INDEPENDENT_AMBULATORY_CARE_PROVIDER_SITE_OTHER): Payer: Medicare Other | Admitting: Cardiothoracic Surgery

## 2013-06-06 ENCOUNTER — Encounter: Payer: Self-pay | Admitting: Cardiothoracic Surgery

## 2013-06-06 VITALS — BP 104/66 | HR 100 | Resp 20 | Ht 73.0 in | Wt 209.0 lb

## 2013-06-06 DIAGNOSIS — R222 Localized swelling, mass and lump, trunk: Secondary | ICD-10-CM

## 2013-06-06 DIAGNOSIS — R918 Other nonspecific abnormal finding of lung field: Secondary | ICD-10-CM

## 2013-06-06 LAB — PULMONARY FUNCTION TEST
DL/VA % pred: 64 %
DL/VA: 3.08 ml/min/mmHg/L
DLCO cor % pred: 53 %
DLCO cor: 19.77 ml/min/mmHg
DLCO unc % pred: 53 %
DLCO unc: 19.77 ml/min/mmHg
FEF 25-75 Post: 1.69 L/sec
FEF 25-75 Pre: 1.55 L/sec
FEF2575-%Change-Post: 9 %
FEF2575-%Pred-Post: 62 %
FEF2575-%Pred-Pre: 57 %
FEV1-%Change-Post: 1 %
FEV1-%Pred-Post: 80 %
FEV1-%Pred-Pre: 79 %
FEV1-Post: 2.92 L
FEV1-Pre: 2.89 L
FEV1FVC-%Change-Post: 2 %
FEV1FVC-%Pred-Pre: 93 %
FEV6-%Change-Post: 0 %
FEV6-%Pred-Post: 88 %
FEV6-%Pred-Pre: 89 %
FEV6-Post: 4.12 L
FEV6-Pre: 4.16 L
FEV6FVC-%Change-Post: 0 %
FEV6FVC-%Pred-Post: 103 %
FEV6FVC-%Pred-Pre: 103 %
FVC-%Change-Post: -1 %
FVC-%Pred-Post: 84 %
FVC-%Pred-Pre: 85 %
FVC-Post: 4.19 L
FVC-Pre: 4.23 L
Post FEV1/FVC ratio: 70 %
Post FEV6/FVC ratio: 98 %
Pre FEV1/FVC ratio: 68 %
Pre FEV6/FVC Ratio: 98 %
RV % pred: 110 %
RV: 2.98 L
TLC % pred: 92 %
TLC: 7.16 L

## 2013-06-06 MED ORDER — ALBUTEROL SULFATE (2.5 MG/3ML) 0.083% IN NEBU
2.5000 mg | INHALATION_SOLUTION | Freq: Once | RESPIRATORY_TRACT | Status: AC
  Administered 2013-06-06: 2.5 mg via RESPIRATORY_TRACT

## 2013-06-06 NOTE — Progress Notes (Signed)
[name]       [place] [address]             [phone]                    Terry Robles Medical Record #[id number] Date of Birth: 1940/03/29  Referring: Iona Beard, MD Primary Care: [name], MD  Chief Complaint: Right Upper Lobe Lung lesion with chest wall involvement   History of Present Illness:    Terry Robles 73 y.o. male is seen in the office  today for right upper lobe lung lesion with chest wall involvement. He has  past medical history of  dyslipidemia and benign prostatic hypertrophy as well as long history of smoking but quit 15 years ago. The patient noted   cough for more than 6 months but 4 weeks ago his cough was associated with hemoptysis. His primary care physician Dr. Iona Beard.  ordered CT scan of the chest which was performed on 04/12/2013 and it showed 8.5 x 6.7 x 8.7 CM a right Pancoast tumor invading the typical chest with bony destruction of the right second and third ribs along the tumor margin. There was confluent adenopathy related to the mass present in the right suprahilar and hilar regions. 1 lymph node measured 1.6 CM in short axis and a lower right peritracheal node measured 0.8 CM in short axis. The tumor and adjacent adenopathy cause extrinsic narrowing of the right upper lobe pulmonary artery.  A PET scan was performed in 04/25/2013. It showed a large hypermetabolic right upper lobe mass consistent with primary bronchogenic carcinoma. There is hypermetabolic right hilar metastatic lymph nodes and mild metabolic activity of right lower paratracheal lymph node concerning for metastases but indeterminate.   On 04/30/2013, the patient underwent CT-guided core biopsy of the right upper lobe lung lesion by interventional radiology.  The final pathology (Accession: [id number]) also right upper lobe biopsy was consistent with non-small cell carcinoma. The core biopsies are extensively involved by non small cell  carcinoma associated with large areas of necrosis. The morphologic features favor squamous cell carcinoma. Immunohistochemistry is performed and the tumor is positive with Cytokeratin 5/6, Cytokeratin 903 and p63. The tumor is negative with Cytokeratin 7, Napsin A, Thyroid Transcription Factor -1 (TTF-1) and Cytokeratin 20 The immunohistochemical findings are consistent with the morphologic impression of squamous cell carcinoma.   The patient has  aching pain especially on the right side of the chest radiating in  to the shoulder and down to his right arm. He also has shortness breath with exertion such as walking up hill  and mild cough productive of clear sputum but no more hemoptysis. He lost around 15 pounds in the last 4 weeks secondary to lack of appetite. He denied having any significant nausea or vomiting. He denied having any headache or blurry vision.    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   Zubrod Score: At the time of surgery this patient's most appropriate activity status/level should be described as: []     0    Normal activity, no symptoms [x]   1    Restricted in physical strenuous activity but ambulatory, able to do out light work []     2    Ambulatory and capable of self care, unable to do work activities, up and about               >50 % of waking hours                              []     3    Only limited self care, in bed greater than 50% of waking hours []     4    Completely disabled, no self care, confined to bed or chair []     5    Moribund   Past Medical History  Diagnosis Date  . Hypercholesteremia 05/14/2011  . BPH (benign prostatic  hyperplasia) 05/14/2011    Past Surgical History  Procedure Laterality Date  . Tonsillectomy    . Other surgical history      benign tumor removed from left leg several years ago  . Other surgical history      had melanoma/squamous cell removed previously per patient    Family History  Problem Relation Age of Onset  . COPD Paternal Grandfather   . Breast cancer Paternal Aunt   Family history significant for her father who died from hepatitis secondary to blood transfusion, mother died from COPD at age 50 and paternal grandmother died from pancreatic cancer, maternal grandfather died from a stroke and brother died from melanoma at age 42.   History   Social History  . Marital Status: Married    Spouse Name: N/A    Number of Children: N/A  . Years of Education: N/A   Occupational History  . Not on file.   Social History Main Topics  . Smoking status: Former Smoker -- 1.00 packs/day for 42 years    Types: Cigarettes    Quit date: 03/28/1998  . Smokeless tobacco: Never Used  . Alcohol Use: Yes     Comment: occasional/ beer - no more than one beer in 24 hrs   . Drug Use: No  . Sexual Activity: Not on file   Other Topics Concern  . Not on file   Social History Narrative  . No narrative on file    History  Smoking status  . Former Smoker -- 1.00 packs/day for 42 years  . Types: Cigarettes  . Quit date: 03/28/1998  Smokeless tobacco  . Never Used    History  Alcohol Use  . Yes    Comment: occasional/ beer - no more than one beer in 24 hrs      No Known Allergies  Current Outpatient Prescriptions  Medication Sig Dispense Refill  . diphenhydramine-acetaminophen (TYLENOL PM) 25-500 MG TABS Take 1 tablet by mouth at bedtime as needed.      . docusate sodium (COLACE) 100 MG capsule Take 100 mg by mouth daily.      . Multiple Vitamin (MULITIVITAMIN WITH MINERALS) TABS Take 1 tablet by mouth daily.      Marland Kitchen oxyCODONE-acetaminophen (PERCOCET/ROXICET) 5-325 MG per  tablet Take 1 tablet by mouth every 6 (six) hours as needed for severe pain.  60 tablet  0  . prochlorperazine (COMPAZINE) 10 MG tablet Take 1 tablet (10 mg total) by mouth every 6 (six) hours as needed for nausea or vomiting.  60 tablet  1  . Tamsulosin HCl (FLOMAX) 0.4 MG CAPS  Take 0.4 mg by mouth daily after supper.       No current facility-administered medications for this visit.     Review of Systems:     Cardiac Review of Systems: Y or N  Chest Pain [  n  ]  Resting SOB [  n ] Exertional SOB  [ mild ]  Orthopnea [  n]   Pedal Edema [ n  ]    Palpitations [n  ] Syncope  [n  ]   Presyncope [  n ]  General Review of Systems: [Y] = yes [  ]=no Constitional: recent weight change [  ];  Wt loss over the last 3 months [ 15 lbs  ] anorexia Blue.Reese  ]; fatigue [  y]; nausea [  ]; night sweats [ n ]; fever [n  ]; or chills [ n ];          Dental: poor dentition[n  ]; Last Dentist visit:   Eye : blurred vision [  ]; diplopia [   ]; vision changes [  ];  Amaurosis fugax[  ]; Resp: cough [ y ];  wheezing[  ];  hemoptysis[ y ]; shortness of breath[y  ]; paroxysmal nocturnal dyspnea[  ]; dyspnea on exertion[ y ]; or orthopnea[  ];  GI:  gallstones[  ], vomiting[  ];  dysphagia[  ]; melena[  ];  hematochezia [  ]; heartburn[  ];   Hx of  Colonoscopy[ y ]; GU: kidney stones [  ]; hematuria[  ];   dysuria [  ];  nocturia[  ];  history of     obstruction [  ]; urinary frequency [n  ]             Skin: rash, swelling[  ];, hair loss[  ];  peripheral edema[  ];  or itching[  ]; Musculosketetal: myalgias[  ];  joint swelling[  ];  joint erythema[  ];  joint pain[  ];  back pain[  ];  Heme/Lymph: bruising[  ];  bleeding[  ];  anemia[  ];  Neuro: TIA[  ];  headaches[  ];  stroke[  ];  vertigo[  ];  seizures[  ];   paresthesias[  ];  difficulty walking[n  ];  Psych:depression[  ]; anxiety[  ];  Endocrine: diabetes[ n ];  thyroid dysfunction[n  ];  Immunizations: Flu up to date [ y ]; Pneumococcal up to date Blue.Reese   ];  Other:  Physical Exam: 110/62 74 97.2 F (36.2 C) (Oral) 18 6\' 1"  (1.854 m) 219 lb 8 oz (99.565 kg)     PHYSICAL EXAMINATION:  General appearance: alert, cooperative, appears older than stated age and no distress Neurologic: intact Heart: regular rate and rhythm, S1, S2 normal, no murmur, click, rub or gallop Lungs: diminished breath sounds RUL Abdomen: soft, non-tender; bowel sounds normal; no masses,  no organomegaly Extremities: extremities normal, atraumatic, no cyanosis or edema and Homans sign is negative, no sign of DVT Patient has no cervical or supraclavicular adenopathy no axillary adenopathy, DP and PT pulses are 2+ bilaterally brachial and radial pulses are 2+ bilaterally   Diagnostic Studies & Laboratory data:     Recent Radiology Findings:  Mr Kizzie Fantasia Contrast  05/14/2013   CLINICAL DATA:  Headaches. Staging of new non-small-cell lung cancer.  EXAM: MRI HEAD WITHOUT AND WITH CONTRAST  TECHNIQUE: Multiplanar, multiecho pulse sequences of the brain and surrounding structures were obtained without and with intravenous contrast.  CONTRAST:  22mL MULTIHANCE GADOBENATE DIMEGLUMINE 529 MG/ML IV SOLN  COMPARISON:  None.  FINDINGS: Incidental note is made of a partially empty sella. There is no acute infarct. There is no evidence of intracranial hemorrhage. Ventricles and sulci are within normal limits for age. Small, scattered foci of T2 hyperintensity within the deep cerebral white matter are nonspecific but compatible with minimal, age-related small vessel ischemic change. There is no mass, midline shift or extra-axial fluid collection. There is no abnormal enhancement.  Major intracranial vascular flow voids are unremarkable. Orbits are unremarkable. Paranasal sinuses and mastoid air cells are clear. Calvarium is normal in signal.  IMPRESSION: No evidence of intracranial metastases.   Electronically Signed   By: Logan Bores   On: 05/14/2013 15:07   Dg Chest 1 View  04/30/2013    CLINICAL DATA:  Right upper lobe mass, post percutaneous biopsy  EXAM: CHEST - 1 VIEW  COMPARISON:  04/25/2013 and earlier studies  FINDINGS: No pneumothorax. Stable right upper lobe/apical mass with probable pathologic fracture through the posterior aspect right second rib. Left lung clear. Heart size normal. No effusion.  IMPRESSION: No pneumothorax post right lung biopsy   Electronically Signed   By: Arne Cleveland M.D.   On: 04/30/2013 13:12   Ct Chest W Contrast  04/12/2013   CLINICAL DATA:  Hemoptysis.  EXAM: CT CHEST WITH CONTRAST  TECHNIQUE: Multidetector CT imaging of the chest was performed during intravenous contrast administration.  CONTRAST:  144mL OMNIPAQUE IOHEXOL 300 MG/ML  SOLN  COMPARISON:  DG CHEST 1V PORT dated 05/14/2011  FINDINGS: 8.5 x 6.7 by 8.7 cm right Pancoast tumor invades the typical chest wall with bony destruction of the right second and third ribs along the tumor margin. Confluent adenopathy related to the mass is present in the right suprahilar and hilar regions. One hilar node on image 27 of series 2 measures 1.6 cm in short axis. A lower right paratracheal node measures 0.8 cm in short axis on image 20 of series 2.  The tumor and adjacent adenopathy cause extrinsic narrowing of the right upper lobe pulmonary artery. Please note that although today's exam does not show obvious clot in the large pulmonary arteries, it was not protocol does a CT angiography examination.  The smoothly marginated 5.3 x 1.9 by 3.8 cm multilobulated subcutaneous lesion with internal density of approximately 44 Hounsfield units is present along the left anterolateral chest wall. Faint posterior calcification along the margin of this lesion.  Thoracic spondylosis noted. Centrilobular emphysema. 3 mm left upper lobe pulmonary nodule, image 28 of series 3. 4 mm left lower lobe pulmonary nodule, image 42 of series 3. 0.8 x 0.6 cm subpleural nodule in the left lower lobe, image 35 of series 3.  IMPRESSION:  1. 8.5 cm right apical lung cancer (Pancoast tumor) invades the right apical/posterior chest wall with bony invasion of the right second and third ribs. Confluent right hilar and suprahilar adenopathy. 2. Several left-sided pulmonary ill nodules, largest 7 mm in average size, nonspecific for benign versus malignant nodules. 3. Smoothly marginated and fairly homogeneous 5.3 x 1.9 x 3.8 cm multilobulated subcutaneous lesion if along the left lateral chest wall noted. This is homogeneous could represent a ganglion cyst or benign lesion. A subcutaneous metastatic lesion would be unusual but not impossible in light of the right lung findings. This lesion is probably readily palpable; a long patient history of its palpable lesion in this vicinity without significant change would favor a benign etiology for this left-sided  lesion.   Electronically Signed   By: Sherryl Barters M.D.   On: 04/12/2013 09:25   Nm Pet Image Initial (pi) Skull Base To Thigh  04/25/2013   CLINICAL DATA:  Initial treatment strategy for lung mass.  EXAM: NUCLEAR MEDICINE PET SKULL BASE TO THIGH  FASTING BLOOD GLUCOSE:  Value: 104mg /dl  TECHNIQUE: 16.6 mCi F-18 FDG was injected intravenously. CT data was obtained and used for attenuation correction and anatomic localization only. (This was not acquired as a diagnostic CT examination.) Additional exam technical data entered on technologist worksheet.  COMPARISON:  DG CHEST 1V PORT dated 05/14/2011; CT CHEST W/CM dated 04/12/2013  FINDINGS: NECK  No hypermetabolic lymph nodes in the neck.  CHEST  Large right upper lobe mass measuring 7.6 x 7.1 cm has intense metabolic activity with SUV max 25.9. There is a hypermetabolic right hilar lymph node measuring 22 mm (image 93, series 2) with SUV max 21.7.  There is a mildly enlarged right lower paratracheal node measuring 12 mm short axis (image 80) with mild metabolic activity (SUV max 3.4). No supraclavicular or contralateral hypermetabolic nodes.   ABDOMEN/PELVIS  No abnormal hypermetabolic activity within the liver, pancreas, adrenal glands, or spleen. No hypermetabolic lymph nodes in the abdomen or pelvis.  SKELETON  No focal hypermetabolic activity to suggest skeletal metastasis.  IMPRESSION: 1. Large hypermetabolic right upper lobe mass consists with primary bronchogenic carcinoma. 2. Hypermetabolic right hilar metastatic lymph node. 3. Mild metabolic activity of right lower paratracheal lymph node is concerning metastasis but indeterminate. 4. Staging by FDG PET-CT imaging T3 N2 M0   Electronically Signed   By: Suzy Bouchard M.D.   On: 04/25/2013 09:38   Ct Biopsy  04/30/2013   CLINICAL DATA:  Hypermetabolic right apical lung lesion with chest wall involvement  EXAM: CT GUIDED CORE BIOPSY OF RIGHT UPPER LOBE LUNG LESION  ANESTHESIA/SEDATION: Intravenous Fentanyl and Versed were administered as conscious sedation during continuous cardiorespiratory monitoring by the radiology RN, with a total moderate sedation time of 7 minutes.  PROCEDURE: The procedure risks, benefits, and alternatives were explained to the patient. Questions regarding the procedure were encouraged and answered. The patient understands and consents to the procedure.  The posterior right upper chest was prepped with betadinein a sterile fashion, and a sterile drape was applied covering the operative field. A sterile gown and sterile gloves were used for the procedure. Local anesthesia was provided with 1% Lidocaine.  Under CT fluoroscopic guidance, a 17 gauge trocar needle was advanced to the margin of the lesion. Once needle tip position was confirmed, coaxial 18-gauge core biopsy samples were obtained, submitted in formalin to surgical pathology. The guide needle was removed. Postprocedure scans show no pneumothorax or hemorrhage. The patient tolerated the procedure well.  Complications: none  FINDINGS: Right apical lung mass with posterior chest wall involvement. Technically  successful core biopsy as above.  IMPRESSION: 1. Technically successful right upper lobe lung core needle biopsy. Observation and follow-up radiography scheduled.   Electronically Signed   By: Arne Cleveland M.D.   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  2 PFT's done 3 MRI of Brain negative Patient appears to be tolerating current radiation and chemotherapy treatment, we'll plan to see him back in approximately 4 weeks at the completion of his radiation. At that time I will coordinate with the oncology team followup scans and make a final decision about pros and cons of surgical resection/with chest wall resection   Grace Isaac MD      [address] [place] [address] Office [phone]   Beeper [phone]  06/06/2013 10:26 AM

## 2013-06-07 ENCOUNTER — Ambulatory Visit
Admission: RE | Admit: 2013-06-07 | Discharge: 2013-06-07 | Disposition: A | Discharge status: Home / Self Care | Payer: Medicare Other | Source: Ambulatory Visit | Attending: Radiation Oncology | Admitting: Radiation Oncology

## 2013-06-07 ENCOUNTER — Encounter: Payer: Self-pay | Admitting: Radiation Oncology

## 2013-06-07 VITALS — BP 130/75 | HR 78 | Temp 97.6°F | Ht 73.0 in | Wt 212.7 lb

## 2013-06-07 DIAGNOSIS — K209 Esophagitis, unspecified without bleeding: Secondary | ICD-10-CM

## 2013-06-07 DIAGNOSIS — C3410 Malignant neoplasm of upper lobe, unspecified bronchus or lung: Secondary | ICD-10-CM

## 2013-06-07 MED ORDER — RADIAPLEXRX EX GEL
Freq: Once | CUTANEOUS | Status: AC
  Administered 2013-06-07: 17:00:00 via TOPICAL

## 2013-06-07 MED ORDER — SUCRALFATE 1 G PO TABS: 1.0000 g | ORAL_TABLET | Freq: Three times a day (TID) | ORAL | Status: DC

## 2013-06-07 NOTE — Progress Notes (Signed)
  Radiation Oncology         [phone] ________________________________  Name: Terry Robles MRN: [id number]  Date: 06/07/2013  DOB: [birth year]  Weekly Radiation Therapy Management  Current Dose: 30 Gy     Planned Dose:  66 Gy  Narrative . . . . . . . . The patient presents for routine under treatment assessment.                                 Terry Robles has had 15 fractions to his right lung. He is having pain in his right chest/right upper back that he takes percocet for. He usually takes 4 per day. He denies shortness of breath. He reports fatigue. He reports an occasional cough. He denies hemoptysis. He reports a scratchy throat and trouble swallowing. His weight is steady today although he reports a poor appetite. His wife says he does not eat when his throat hurts. He last had chemotherapy on Monday. He has a yellowish coating on his tongue. He has dry, red skin on his right upper back and mid chest. Radiaplex given and he was advised to apply it after radiation at at bedtime                                 Set-up films were reviewed.                                 The chart was checked. Physical Findings. . .  height is 6\' 1"  (1.854 m) and weight is 212 lb 11.2 oz (96.48 kg). His temperature is 97.6 F (36.4 C). His blood pressure is 130/75 and his pulse is 78. His oxygen saturation is 100%. . Weight essentially stable.  No significant changes. Impression . . . . . . . The patient is tolerating radiation. Plan . . . . . . . . . . . . Continue treatment as planned.  Start Carafate.  ________________________________  Sheral Apley Tammi Klippel, M.D.

## 2013-06-07 NOTE — Progress Notes (Signed)
Terry Robles has had 15 fractions to his right lung.  He is having pain in his right chest/right upper back that he takes percocet for.  He usually takes 4 per day.  He denies shortness of breath.  He reports fatigue.  He reports an occasional cough.  He denies hemoptysis.  He reports a scratchy throat and trouble swallowing.  His weight is steady today although he reports a poor appetite.  His wife says he does not eat when his throat hurts.  He last had chemotherapy on Monday.  He has a yellowish coating on his tongue.  He has dry, red skin on his right upper back and mid chest.  Radiaplex given and he was advised to apply it after radiation at at bedtime.

## 2013-06-10 ENCOUNTER — Encounter: Payer: Self-pay | Admitting: Physician Assistant

## 2013-06-10 ENCOUNTER — Other Ambulatory Visit (HOSPITAL_BASED_OUTPATIENT_CLINIC_OR_DEPARTMENT_OTHER): Payer: Medicare Other

## 2013-06-10 ENCOUNTER — Ambulatory Visit
Admission: RE | Admit: 2013-06-10 | Discharge: 2013-06-10 | Disposition: A | Discharge status: Home / Self Care | Payer: Medicare Other | Source: Ambulatory Visit | Attending: Radiation Oncology | Admitting: Radiation Oncology

## 2013-06-10 ENCOUNTER — Other Ambulatory Visit: Payer: Self-pay | Admitting: Internal Medicine

## 2013-06-10 ENCOUNTER — Ambulatory Visit (HOSPITAL_BASED_OUTPATIENT_CLINIC_OR_DEPARTMENT_OTHER): Payer: Medicare Other

## 2013-06-10 VITALS — BP 104/60 | HR 67 | Temp 97.7°F | Resp 18

## 2013-06-10 DIAGNOSIS — Z5111 Encounter for antineoplastic chemotherapy: Secondary | ICD-10-CM

## 2013-06-10 DIAGNOSIS — C3490 Malignant neoplasm of unspecified part of unspecified bronchus or lung: Secondary | ICD-10-CM

## 2013-06-10 DIAGNOSIS — C3410 Malignant neoplasm of upper lobe, unspecified bronchus or lung: Secondary | ICD-10-CM

## 2013-06-10 LAB — CBC WITH DIFFERENTIAL/PLATELET
BASO%: 0.4 % (ref 0.0–2.0)
BASOS ABS: 0 10*3/uL (ref 0.0–0.1)
EOS ABS: 0.1 10*3/uL (ref 0.0–0.5)
EOS%: 1.1 % (ref 0.0–7.0)
HCT: 37.4 % — ABNORMAL LOW (ref 38.4–49.9)
HEMOGLOBIN: 12.4 g/dL — AB (ref 13.0–17.1)
LYMPH%: 15 % (ref 14.0–49.0)
MCH: 30.1 pg (ref 27.2–33.4)
MCHC: 33.2 g/dL (ref 32.0–36.0)
MCV: 90.8 fL (ref 79.3–98.0)
MONO#: 0.6 10*3/uL (ref 0.1–0.9)
MONO%: 9.8 % (ref 0.0–14.0)
NEUT%: 73.7 % (ref 39.0–75.0)
NEUTROS ABS: 4.1 10*3/uL (ref 1.5–6.5)
PLATELETS: 256 10*3/uL (ref 140–400)
RBC: 4.12 10*6/uL — ABNORMAL LOW (ref 4.20–5.82)
RDW: 14.7 % — ABNORMAL HIGH (ref 11.0–14.6)
WBC: 5.6 10*3/uL (ref 4.0–10.3)
lymph#: 0.8 10*3/uL — ABNORMAL LOW (ref 0.9–3.3)

## 2013-06-10 LAB — COMPREHENSIVE METABOLIC PANEL (CC13)
ALK PHOS: 158 U/L — AB (ref 40–150)
ALT: 20 U/L (ref 0–55)
AST: 14 U/L (ref 5–34)
Albumin: 2.8 g/dL — ABNORMAL LOW (ref 3.5–5.0)
Anion Gap: 11 mEq/L (ref 3–11)
BILIRUBIN TOTAL: 0.27 mg/dL (ref 0.20–1.20)
BUN: 14.5 mg/dL (ref 7.0–26.0)
CO2: 24 mEq/L (ref 22–29)
Calcium: 9.9 mg/dL (ref 8.4–10.4)
Chloride: 106 mEq/L (ref 98–109)
Creatinine: 0.9 mg/dL (ref 0.7–1.3)
GLUCOSE: 108 mg/dL (ref 70–140)
Potassium: 3.9 mEq/L (ref 3.5–5.1)
Sodium: 141 mEq/L (ref 136–145)
Total Protein: 6.9 g/dL (ref 6.4–8.3)

## 2013-06-10 MED ORDER — DIPHENHYDRAMINE HCL 50 MG/ML IJ SOLN
INTRAMUSCULAR | Status: AC
  Filled 2013-06-10: qty 1

## 2013-06-10 MED ORDER — DEXAMETHASONE SODIUM PHOSPHATE 20 MG/5ML IJ SOLN
20.0000 mg | Freq: Once | INTRAMUSCULAR | Status: AC
  Administered 2013-06-10: 20 mg via INTRAVENOUS

## 2013-06-10 MED ORDER — FAMOTIDINE IN NACL 20-0.9 MG/50ML-% IV SOLN
INTRAVENOUS | Status: AC
  Filled 2013-06-10: qty 50

## 2013-06-10 MED ORDER — ONDANSETRON 16 MG/50ML IVPB (CHCC)
16.0000 mg | Freq: Once | INTRAVENOUS | Status: AC
  Administered 2013-06-10: 16 mg via INTRAVENOUS

## 2013-06-10 MED ORDER — SODIUM CHLORIDE 0.9 % IV SOLN
206.8000 mg | Freq: Once | INTRAVENOUS | Status: AC
  Administered 2013-06-10: 210 mg via INTRAVENOUS
  Filled 2013-06-10: qty 21

## 2013-06-10 MED ORDER — ONDANSETRON 16 MG/50ML IVPB (CHCC)
INTRAVENOUS | Status: AC
  Filled 2013-06-10: qty 16

## 2013-06-10 MED ORDER — DIPHENHYDRAMINE HCL 50 MG/ML IJ SOLN
50.0000 mg | Freq: Once | INTRAMUSCULAR | Status: AC
  Administered 2013-06-10: 50 mg via INTRAVENOUS

## 2013-06-10 MED ORDER — DEXAMETHASONE SODIUM PHOSPHATE 20 MG/5ML IJ SOLN
INTRAMUSCULAR | Status: AC
  Filled 2013-06-10: qty 5

## 2013-06-10 MED ORDER — SODIUM CHLORIDE 0.9 % IV SOLN
45.0000 mg/m2 | Freq: Once | INTRAVENOUS | Status: AC
  Administered 2013-06-10: 102 mg via INTRAVENOUS
  Filled 2013-06-10: qty 17

## 2013-06-10 MED ORDER — FAMOTIDINE IN NACL 20-0.9 MG/50ML-% IV SOLN
20.0000 mg | Freq: Once | INTRAVENOUS | Status: AC
  Administered 2013-06-10: 20 mg via INTRAVENOUS

## 2013-06-10 MED ORDER — SODIUM CHLORIDE 0.9 % IV SOLN
Freq: Once | INTRAVENOUS | Status: AC
  Administered 2013-06-10: 14:00:00 via INTRAVENOUS

## 2013-06-10 NOTE — Progress Notes (Unsigned)
Scanned patient four times yet no response noted on MAR.  Was to notify support staff to notify assist desk.  Second nurse able to use scanner for pepcid.

## 2013-06-10 NOTE — Patient Instructions (Signed)
[facility] Discharge Instructions for Patients Receiving Chemotherapy  Today you received the following chemotherapy agents Taxol, carboplatin.  To help prevent nausea and vomiting after your treatment, we encourage you to take your nausea medication compazine 10 mg every 6 hours as needed or thirty minutes before meals and at bedtime as needed.    If you develop nausea and vomiting that is not controlled by your nausea medication, call the clinic.   BELOW ARE SYMPTOMS THAT SHOULD BE REPORTED IMMEDIATELY:  *FEVER GREATER THAN 100.5 F  *CHILLS WITH OR WITHOUT FEVER  NAUSEA AND VOMITING THAT IS NOT CONTROLLED WITH YOUR NAUSEA MEDICATION  *UNUSUAL SHORTNESS OF BREATH  *UNUSUAL BRUISING OR BLEEDING  TENDERNESS IN MOUTH AND THROAT WITH OR WITHOUT PRESENCE OF ULCERS  *URINARY PROBLEMS  *BOWEL PROBLEMS  UNUSUAL RASH Items with * indicate a potential emergency and should be followed up as soon as possible.  Feel free to call the clinic you have any questions or concerns. The clinic phone number is [phone].

## 2013-06-11 ENCOUNTER — Other Ambulatory Visit: Payer: Self-pay | Admitting: *Deleted

## 2013-06-11 ENCOUNTER — Ambulatory Visit
Admission: RE | Admit: 2013-06-11 | Discharge: 2013-06-11 | Disposition: A | Discharge status: Home / Self Care | Payer: Medicare Other | Source: Ambulatory Visit | Attending: Radiation Oncology | Admitting: Radiation Oncology

## 2013-06-11 DIAGNOSIS — C3490 Malignant neoplasm of unspecified part of unspecified bronchus or lung: Secondary | ICD-10-CM

## 2013-06-11 MED ORDER — OXYCODONE-ACETAMINOPHEN 5-325 MG PO TABS: 1.0000 | ORAL_TABLET | Freq: Four times a day (QID) | ORAL | Status: DC | PRN

## 2013-06-11 NOTE — Telephone Encounter (Signed)
NOTIFIED PT. THAT HIS PRESCRIPTION WAS READY. PT. VOICES UNDERSTANDING.

## 2013-06-12 ENCOUNTER — Ambulatory Visit
Admission: RE | Admit: 2013-06-12 | Discharge: 2013-06-12 | Disposition: A | Discharge status: Home / Self Care | Payer: Medicare Other | Source: Ambulatory Visit | Attending: Radiation Oncology | Admitting: Radiation Oncology

## 2013-06-13 ENCOUNTER — Ambulatory Visit
Admission: RE | Admit: 2013-06-13 | Discharge: 2013-06-13 | Disposition: A | Discharge status: Home / Self Care | Payer: Medicare Other | Source: Ambulatory Visit | Attending: Radiation Oncology | Admitting: Radiation Oncology

## 2013-06-13 VITALS — BP 121/63 | HR 101 | Temp 98.1°F | Ht 73.0 in | Wt 210.5 lb

## 2013-06-13 DIAGNOSIS — C3410 Malignant neoplasm of upper lobe, unspecified bronchus or lung: Secondary | ICD-10-CM

## 2013-06-13 NOTE — Progress Notes (Signed)
Terry Robles has had 19 fractions to his right lung.  He is having pain in his left upper shoulder that he is rating at a 3/10.  He is currently taking 3 percocet per day.  He reports a sore throat that started yesterday.  He is taking carafate before meals.  He reports that he is not feeling well from chemotherapy on Monday.  He has some nausea and is very fatigued.  He reports a poor appetitie.  He has lost 2 lbs from 06/07/13.  He has an appointment to see the dietician next week.  The skin on his right chest and right upper back is red and dry.  He is using radiaplex gel.

## 2013-06-14 ENCOUNTER — Ambulatory Visit
Admission: RE | Admit: 2013-06-14 | Discharge: 2013-06-14 | Disposition: A | Discharge status: Home / Self Care | Payer: Medicare Other | Source: Ambulatory Visit | Attending: Radiation Oncology | Admitting: Radiation Oncology

## 2013-06-16 ENCOUNTER — Encounter: Payer: Self-pay | Admitting: Radiation Oncology

## 2013-06-16 NOTE — Progress Notes (Signed)
  Radiation Oncology         [phone] ________________________________  Name: Terry Robles  MRN: [id number]  Date: 06/13/2013  DOB: 05-06-1940  Weekly Radiation Therapy Management  Current Dose: 34.2 Gy     Planned Dose:  45 Gy  Narrative . . . . . . . . The patient presents for routine under treatment assessment.                                   The patient has had 19 fractions to his right lung. He is having pain in his left upper shoulder that he is rating at a 3/10. He is currently taking 3 percocet per day. He reports a sore throat that started yesterday. He is taking carafate before meals. He reports that he is not feeling well from chemotherapy on Monday. He has some nausea and is very fatigued. He reports a poor appetitie. He has lost 2 lbs from 06/07/13. He has an appointment to see the dietician next week. The skin on his right chest and right upper back is red and dry. He is using radiaplex gel .                                Set-up films were reviewed.                                 The chart was checked. Physical Findings. . .  height is 6\' 1"  (1.854 m) and weight is 210 lb 8 oz (95.482 kg). His temperature is 98.1 F (36.7 C). His blood pressure is 121/63 and his pulse is 101. His oxygen saturation is 99%. . Weight essentially stable.  No significant changes. Impression . . . . . . . The patient is tolerating radiation. Plan . . . . . . . . . . . . Continue treatment as planned.  ________________________________  Sheral Apley. Tammi Klippel, M.D.

## 2013-06-17 ENCOUNTER — Ambulatory Visit: Payer: Medicare Other | Admitting: Nutrition

## 2013-06-17 ENCOUNTER — Other Ambulatory Visit: Payer: Self-pay | Admitting: Internal Medicine

## 2013-06-17 ENCOUNTER — Ambulatory Visit (HOSPITAL_BASED_OUTPATIENT_CLINIC_OR_DEPARTMENT_OTHER): Payer: Medicare Other

## 2013-06-17 ENCOUNTER — Ambulatory Visit
Admission: RE | Admit: 2013-06-17 | Discharge: 2013-06-17 | Disposition: A | Discharge status: Home / Self Care | Payer: Medicare Other | Source: Ambulatory Visit | Attending: Radiation Oncology | Admitting: Radiation Oncology

## 2013-06-17 ENCOUNTER — Other Ambulatory Visit (HOSPITAL_BASED_OUTPATIENT_CLINIC_OR_DEPARTMENT_OTHER): Payer: Medicare Other

## 2013-06-17 VITALS — BP 125/73 | HR 84 | Temp 97.4°F | Resp 18

## 2013-06-17 DIAGNOSIS — C3410 Malignant neoplasm of upper lobe, unspecified bronchus or lung: Secondary | ICD-10-CM

## 2013-06-17 DIAGNOSIS — C3490 Malignant neoplasm of unspecified part of unspecified bronchus or lung: Secondary | ICD-10-CM

## 2013-06-17 DIAGNOSIS — Z5111 Encounter for antineoplastic chemotherapy: Secondary | ICD-10-CM

## 2013-06-17 LAB — CBC WITH DIFFERENTIAL/PLATELET
BASO%: 0.4 % (ref 0.0–2.0)
Basophils Absolute: 0 10*3/uL (ref 0.0–0.1)
EOS%: 0.8 % (ref 0.0–7.0)
Eosinophils Absolute: 0 10*3/uL (ref 0.0–0.5)
HCT: 35.2 % — ABNORMAL LOW (ref 38.4–49.9)
HGB: 11.7 g/dL — ABNORMAL LOW (ref 13.0–17.1)
LYMPH%: 14.7 % (ref 14.0–49.0)
MCH: 30.4 pg (ref 27.2–33.4)
MCHC: 33.2 g/dL (ref 32.0–36.0)
MCV: 91.4 fL (ref 79.3–98.0)
MONO#: 0.6 10*3/uL (ref 0.1–0.9)
MONO%: 11.5 % (ref 0.0–14.0)
NEUT#: 3.7 10*3/uL (ref 1.5–6.5)
NEUT%: 72.6 % (ref 39.0–75.0)
PLATELETS: 199 10*3/uL (ref 140–400)
RBC: 3.85 10*6/uL — ABNORMAL LOW (ref 4.20–5.82)
RDW: 15.4 % — AB (ref 11.0–14.6)
WBC: 5.1 10*3/uL (ref 4.0–10.3)
lymph#: 0.7 10*3/uL — ABNORMAL LOW (ref 0.9–3.3)
nRBC: 0 % (ref 0–0)

## 2013-06-17 LAB — COMPREHENSIVE METABOLIC PANEL (CC13)
ALK PHOS: 158 U/L — AB (ref 40–150)
ALT: 26 U/L (ref 0–55)
ANION GAP: 10 meq/L (ref 3–11)
AST: 18 U/L (ref 5–34)
Albumin: 3 g/dL — ABNORMAL LOW (ref 3.5–5.0)
BILIRUBIN TOTAL: 0.29 mg/dL (ref 0.20–1.20)
BUN: 15.2 mg/dL (ref 7.0–26.0)
CO2: 24 meq/L (ref 22–29)
Calcium: 9.7 mg/dL (ref 8.4–10.4)
Chloride: 105 mEq/L (ref 98–109)
Creatinine: 0.8 mg/dL (ref 0.7–1.3)
Glucose: 107 mg/dl (ref 70–140)
Potassium: 4.3 mEq/L (ref 3.5–5.1)
SODIUM: 139 meq/L (ref 136–145)
TOTAL PROTEIN: 7.1 g/dL (ref 6.4–8.3)

## 2013-06-17 MED ORDER — DEXAMETHASONE SODIUM PHOSPHATE 20 MG/5ML IJ SOLN
INTRAMUSCULAR | Status: AC
  Filled 2013-06-17: qty 5

## 2013-06-17 MED ORDER — DIPHENHYDRAMINE HCL 50 MG/ML IJ SOLN
50.0000 mg | Freq: Once | INTRAMUSCULAR | Status: AC
  Administered 2013-06-17: 50 mg via INTRAVENOUS

## 2013-06-17 MED ORDER — SODIUM CHLORIDE 0.9 % IV SOLN
45.0000 mg/m2 | Freq: Once | INTRAVENOUS | Status: AC
  Administered 2013-06-17: 102 mg via INTRAVENOUS
  Filled 2013-06-17: qty 17

## 2013-06-17 MED ORDER — FAMOTIDINE IN NACL 20-0.9 MG/50ML-% IV SOLN
INTRAVENOUS | Status: AC
  Filled 2013-06-17: qty 50

## 2013-06-17 MED ORDER — SODIUM CHLORIDE 0.9 % IV SOLN
Freq: Once | INTRAVENOUS | Status: AC
  Administered 2013-06-17: 14:00:00 via INTRAVENOUS

## 2013-06-17 MED ORDER — FAMOTIDINE IN NACL 20-0.9 MG/50ML-% IV SOLN
20.0000 mg | Freq: Once | INTRAVENOUS | Status: AC
  Administered 2013-06-17: 20 mg via INTRAVENOUS

## 2013-06-17 MED ORDER — SODIUM CHLORIDE 0.9 % IV SOLN
206.8000 mg | Freq: Once | INTRAVENOUS | Status: AC
  Administered 2013-06-17: 210 mg via INTRAVENOUS
  Filled 2013-06-17: qty 21

## 2013-06-17 MED ORDER — ONDANSETRON 16 MG/50ML IVPB (CHCC)
16.0000 mg | Freq: Once | INTRAVENOUS | Status: AC
  Administered 2013-06-17: 16 mg via INTRAVENOUS

## 2013-06-17 MED ORDER — ONDANSETRON 16 MG/50ML IVPB (CHCC)
INTRAVENOUS | Status: AC
  Filled 2013-06-17: qty 16

## 2013-06-17 MED ORDER — DEXAMETHASONE SODIUM PHOSPHATE 20 MG/5ML IJ SOLN
20.0000 mg | Freq: Once | INTRAMUSCULAR | Status: AC
  Administered 2013-06-17: 20 mg via INTRAVENOUS

## 2013-06-17 MED ORDER — DIPHENHYDRAMINE HCL 50 MG/ML IJ SOLN
INTRAMUSCULAR | Status: AC
  Filled 2013-06-17: qty 1

## 2013-06-17 NOTE — Progress Notes (Signed)
73 year old male diagnosed with lung cancer.  Past medical history includes BPH and hypercholesterolemia.  Medications include Colace, multivitamin, Compazine, and Carafate.  Labs include an albumin of 2.8 on March 16.  Height: 6 feet 1 inch. Weight: 210.5 pounds March 19. Usual body weight: 223 pounds January 2015. BMI: 27.78.  Patient is receiving concurrent chemoradiation therapy.  He reports that radiation may complete at the end of this week.  He currently denies difficulty swallowing, but has had this problem in the past.  Currently patient is eating a ham sandwich and Doritoes without difficulty.  Patient's lost 6% of previous body weight over the past 2 months.  Patient complains of constipation.  Nutrition diagnosis: Unintended weight loss related to inadequate oral intake as evidenced by 13 pound weight loss.  Intervention: Patient educated on strategies for increasing overall calories and protein and consuming softer, moister foods as needed to improve oral intake.  Patient educated on strategies for improving constipation.  Encouraged patient to add dried plums or prune juice daily.  Educated patient to increase overall fluid intake.  Provided fact sheets.  Questions were answered and teach back method used.  Monitoring, evaluation, goals: Patient will tolerate adequate calories and protein to minimize further weight loss.  Patient will add dried plums or prune juice for improved constipation.  Next visit: Monday, April 6, during chemotherapy.

## 2013-06-17 NOTE — Patient Instructions (Signed)
[facility] Discharge Instructions for Patients Receiving Chemotherapy  Today you received the following chemotherapy agents Taxol/Carboplatin To help prevent nausea and vomiting after your treatment, we encourage you to take your nausea medication as prescribed.  If you develop nausea and vomiting that is not controlled by your nausea medication, call the clinic.   BELOW ARE SYMPTOMS THAT SHOULD BE REPORTED IMMEDIATELY:  *FEVER GREATER THAN 100.5 F  *CHILLS WITH OR WITHOUT FEVER  NAUSEA AND VOMITING THAT IS NOT CONTROLLED WITH YOUR NAUSEA MEDICATION  *UNUSUAL SHORTNESS OF BREATH  *UNUSUAL BRUISING OR BLEEDING  TENDERNESS IN MOUTH AND THROAT WITH OR WITHOUT PRESENCE OF ULCERS  *URINARY PROBLEMS  *BOWEL PROBLEMS  UNUSUAL RASH Items with * indicate a potential emergency and should be followed up as soon as possible.  Feel free to call the clinic you have any questions or concerns. The clinic phone number is [phone].    

## 2013-06-18 ENCOUNTER — Ambulatory Visit
Admission: RE | Admit: 2013-06-18 | Discharge: 2013-06-18 | Disposition: A | Discharge status: Home / Self Care | Payer: Medicare Other | Source: Ambulatory Visit | Attending: Radiation Oncology | Admitting: Radiation Oncology

## 2013-06-19 ENCOUNTER — Ambulatory Visit
Admission: RE | Admit: 2013-06-19 | Discharge: 2013-06-19 | Disposition: A | Discharge status: Home / Self Care | Payer: Medicare Other | Source: Ambulatory Visit | Attending: Radiation Oncology | Admitting: Radiation Oncology

## 2013-06-19 ENCOUNTER — Encounter: Payer: Self-pay | Admitting: Internal Medicine

## 2013-06-19 ENCOUNTER — Ambulatory Visit (HOSPITAL_BASED_OUTPATIENT_CLINIC_OR_DEPARTMENT_OTHER): Payer: Medicare Other | Admitting: Internal Medicine

## 2013-06-19 VITALS — BP 116/60 | HR 90 | Temp 97.6°F | Resp 18 | Ht 73.0 in | Wt 211.1 lb

## 2013-06-19 DIAGNOSIS — R5381 Other malaise: Secondary | ICD-10-CM

## 2013-06-19 DIAGNOSIS — R5383 Other fatigue: Secondary | ICD-10-CM

## 2013-06-19 DIAGNOSIS — C3410 Malignant neoplasm of upper lobe, unspecified bronchus or lung: Secondary | ICD-10-CM

## 2013-06-19 NOTE — Progress Notes (Signed)
[place]  [phone] [phone] OFFICE VISIT PROGRESS NOTE  [name], MD [address] 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   INTERVAL HISTORY: Terry Robles 73 y.o. male returns for followup visit. The patient is feeling fine today with no specific complaints except for mild fatigue and mild odynophagia from radiation induced esophagitis. The patient is tolerating his course of concurrent chemoradiation fairly well. He denied having any significant fever or chills. He has no nausea or vomiting. The patient denied having any significant chest pain, shortness breath, cough or hemoptysis. He completed 5 weeks of weekly chemotherapy concurrent with radiation. He mentioned that Dr. Tammi Klippel is planning to cut the dose of his radiotherapy to around 5 weeks as the patient is a potentially surgical candidate  for resection.  MEDICAL HISTORY: Past Medical History  Diagnosis Date  . Hypercholesteremia 05/14/2011  . BPH (benign prostatic hyperplasia) 05/14/2011    ALLERGIES:  has No Known Allergies.  MEDICATIONS:  Current Outpatient Prescriptions  Medication Sig Dispense Refill  . docusate sodium (COLACE) 100 MG capsule Take 100 mg by mouth daily.      . hyaluronate sodium (RADIAPLEXRX) GEL Apply 1 application topically 2 (two) times daily.      . Multiple Vitamin (MULITIVITAMIN WITH MINERALS) TABS Take 1 tablet by mouth daily.      Marland Kitchen oxyCODONE-acetaminophen (PERCOCET/ROXICET) 5-325 MG per tablet Take 1 tablet by mouth every 6 (six) hours as needed for severe pain.  60 tablet  0  . prochlorperazine (COMPAZINE) 10 MG tablet Take 1 tablet (10 mg total) by mouth every 6 (six) hours as needed for nausea or vomiting.  60 tablet  1  . sucralfate (CARAFATE) 1 G tablet Take 1 tablet (1 g total) by mouth 4 (four) times daily -  with meals and at bedtime. Take FIVE minutes before food  120 tablet  2  . Tamsulosin HCl (FLOMAX) 0.4 MG CAPS Take 0.4 mg by mouth daily after supper.       No current facility-administered medications for this visit.    SURGICAL HISTORY:  Past Surgical History  Procedure Laterality Date  . Tonsillectomy    . Other surgical history      benign tumor removed from left leg several years ago  . Other surgical history      had melanoma/squamous cell removed previously per patient    REVIEW OF  SYSTEMS:  Constitutional: positive for fatigue Eyes: negative Ears, nose, mouth, throat, and face: negative Respiratory: positive for dyspnea on exertion Cardiovascular: negative Gastrointestinal: positive for constipation, diarrhea, nausea and vomiting Genitourinary:negative Integument/breast: negative Hematologic/lymphatic: negative Musculoskeletal:negative Neurological: negative Behavioral/Psych: negative Endocrine: negative Allergic/Immunologic: negative   PHYSICAL  EXAMINATION: General appearance: alert, cooperative, appears stated age and no distress Head: Normocephalic, without obvious abnormality, atraumatic Neck: no adenopathy, no carotid bruit, no JVD, supple, symmetrical, trachea midline and thyroid not enlarged, symmetric, no tenderness/mass/nodules Lymph nodes: Cervical, supraclavicular, and axillary nodes normal. Resp: clear to auscultation bilaterally Back: symmetric, no curvature. ROM normal. No CVA tenderness. Cardio: regular rate and rhythm, S1, S2 normal, no murmur, click, rub or gallop GI: soft, non-tender; bowel sounds normal; no masses,  no organomegaly Extremities: extremities normal, atraumatic, no cyanosis or edema Neurologic: Alert and oriented X 3, normal strength and tone. Normal symmetric reflexes. Normal coordination and gait  ECOG PERFORMANCE STATUS: 1 - Symptomatic but completely ambulatory  Blood pressure 116/60, pulse 90, temperature 97.6 F (36.4 C), temperature source Oral, resp. rate 18, height 6\' 1"  (1.854 m), weight 211 lb 1.6 oz (95.754 kg), SpO2 100.00%.  LABORATORY DATA: Lab Results  Component Value Date   WBC 5.1 06/17/2013   HGB 11.7* 06/17/2013   HCT 35.2* 06/17/2013   MCV 91.4 06/17/2013   PLT 199 06/17/2013      Chemistry      Component Value Date/Time   NA 139 06/17/2013 1256   NA 135 05/15/2011 0630   K 4.3 06/17/2013 1256   K 3.7 05/15/2011 0630   CL 102 05/15/2011 0630   CO2 24 06/17/2013 1256   CO2 26 05/15/2011 0630   BUN 15.2 06/17/2013 1256   BUN 13 05/15/2011 0630   CREATININE 0.8 06/17/2013 1256   CREATININE 1.10 04/12/2013 0816      Component Value Date/Time   CALCIUM 9.7 06/17/2013 1256   CALCIUM 8.9 05/15/2011 0630   ALKPHOS 158* 06/17/2013 1256   ALKPHOS 71 05/15/2011 0630   AST 18 06/17/2013 1256   AST 38* 05/15/2011 0630   ALT 26 06/17/2013 1256   ALT 46 05/15/2011 0630   BILITOT 0.29 06/17/2013 1256   BILITOT 1.4* 05/15/2011 0630       RADIOGRAPHIC STUDIES:  ASSESSMENT/PLAN:  This is a  very pleasant 73 years old white male recently diagnosed with a stage IIIA non-small cell lung cancer. He is currently undergoing concurrent chemoradiation with weekly carboplatin and paclitaxel is status post 5 cycle. He is tolerating his treatment fairly well except mild fatigue.  He is expected to complete this course of concurrent chemoradiation soon. I will arrange for the patient to have repeat CT scan of the chest in around 6 weeks for restaging of his disease before referring him back to Dr. Servando Snare for evaluation of surgical resection.  He would come back for followup visit in 6 weeks. He was advised to call immediately if he has any concerning symptoms in the interval.  Disclaimer: This note was dictated with voice recognition software. Similar sounding words can inadvertently be transcribed and may not be corrected upon review.

## 2013-06-20 ENCOUNTER — Encounter: Payer: Self-pay | Admitting: Radiation Oncology

## 2013-06-20 ENCOUNTER — Ambulatory Visit
Admission: RE | Admit: 2013-06-20 | Discharge: 2013-06-20 | Disposition: A | Discharge status: Home / Self Care | Payer: Medicare Other | Source: Ambulatory Visit | Attending: Radiation Oncology | Admitting: Radiation Oncology

## 2013-06-20 NOTE — Progress Notes (Signed)
  Radiation Oncology         [phone] ________________________________  Name: Terry Robles MRN: [id number]  Date: 06/21/2013  DOB: 28-Nov-1940  Weekly Radiation Therapy Management  Current Dose: 45 Gy     Planned Dose:  45 Gy  Narrative . . . . . . . . The patient presents for routine under treatment assessment.                                   The patient is without complaint.                                 Set-up films were reviewed.                                 The chart was checked. Physical Findings. . .  weight is 207 lb 9.6 oz (94.167 kg). His blood pressure is 106/67 and his pulse is 87. His respiration is 16. . Weight essentially stable.  No significant changes. Impression . . . . . . . The patient is tolerating radiation. Plan . . . . . . . . . . . . Complete pre-operative treatment as planned.  Follow-up in one month.  ________________________________  Sheral Apley. Tammi Klippel, M.D.

## 2013-06-21 ENCOUNTER — Telehealth: Payer: Self-pay | Admitting: Internal Medicine

## 2013-06-21 ENCOUNTER — Encounter: Payer: Self-pay | Admitting: Radiation Oncology

## 2013-06-21 ENCOUNTER — Ambulatory Visit
Admission: RE | Admit: 2013-06-21 | Discharge: 2013-06-21 | Disposition: A | Discharge status: Home / Self Care | Payer: Medicare Other | Source: Ambulatory Visit | Attending: Radiation Oncology | Admitting: Radiation Oncology

## 2013-06-21 VITALS — BP 106/67 | HR 87 | Resp 16 | Wt 207.6 lb

## 2013-06-21 DIAGNOSIS — C3410 Malignant neoplasm of upper lobe, unspecified bronchus or lung: Secondary | ICD-10-CM

## 2013-06-21 NOTE — Telephone Encounter (Signed)
, °

## 2013-06-21 NOTE — Progress Notes (Signed)
Scheduled for CAT scan on 4/29 ordered by Dr. Julien Nordmann. Reports discomfort associated with swallowing continues. He explains his throat feels tight. Reports dry cough continues. Hyperpigmentation with dermatitis noted right upper back and center of chest. Reports using radiaplex bid as directed. Reports difficulty sleeping related to nocturia and frequent dry cough. Reports nausea associated with chemo. Reports decreased appetite. 4 lb weight loss noted.

## 2013-06-23 NOTE — Progress Notes (Signed)
  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 A combination of 6 and 10 megavolt x-rays were used. The patient was set up and positioned daily with cone beam CT prior to each fraction.  Narrative: The patient tolerated radiation treatment relatively well.   Some tumor regression was noted on daily set of imaging. He tolerated treatment relatively well. He used Carafate for esophageal symptoms.  Plan: The patient has completed radiation treatment. The patient will return to radiation oncology clinic for routine followup in one month. I advised them to call or return sooner if they have any questions or concerns related to their recovery or treatment. ________________________________  Sheral Apley. Tammi Klippel, M.D.

## 2013-06-24 ENCOUNTER — Encounter: Payer: Self-pay | Admitting: Physician Assistant

## 2013-06-24 ENCOUNTER — Ambulatory Visit: Payer: Medicare Other

## 2013-06-24 ENCOUNTER — Other Ambulatory Visit: Payer: Medicare Other

## 2013-06-25 ENCOUNTER — Encounter: Payer: Self-pay | Admitting: Radiation Oncology

## 2013-06-25 ENCOUNTER — Ambulatory Visit: Payer: Medicare Other

## 2013-06-26 ENCOUNTER — Ambulatory Visit: Payer: Medicare Other

## 2013-06-27 ENCOUNTER — Ambulatory Visit: Payer: Medicare Other

## 2013-06-28 ENCOUNTER — Ambulatory Visit: Payer: Medicare Other

## 2013-07-01 ENCOUNTER — Ambulatory Visit: Payer: Medicare Other

## 2013-07-01 ENCOUNTER — Other Ambulatory Visit: Payer: Self-pay

## 2013-07-01 ENCOUNTER — Encounter: Payer: Medicare Other | Admitting: Nutrition

## 2013-07-01 ENCOUNTER — Other Ambulatory Visit: Payer: Medicare Other

## 2013-07-01 ENCOUNTER — Other Ambulatory Visit: Payer: Self-pay | Admitting: Physician Assistant

## 2013-07-01 ENCOUNTER — Telehealth: Payer: Self-pay | Admitting: Radiation Oncology

## 2013-07-01 ENCOUNTER — Other Ambulatory Visit: Payer: Self-pay | Admitting: Medical Oncology

## 2013-07-01 ENCOUNTER — Encounter: Payer: Self-pay | Admitting: Radiation Oncology

## 2013-07-01 DIAGNOSIS — C3490 Malignant neoplasm of unspecified part of unspecified bronchus or lung: Secondary | ICD-10-CM

## 2013-07-01 MED ORDER — OXYCODONE-ACETAMINOPHEN 5-325 MG PO TABS: 1.0000 | ORAL_TABLET | Freq: Four times a day (QID) | ORAL | Status: DC | PRN

## 2013-07-01 NOTE — Telephone Encounter (Signed)
Phoned patient in reference to email left about radiaplex. Explained to the patient once he completed the present tube of radiaplex he could pick up an OTC lotion preferably with Vitamin E and/or coco butter. Reports his skin has healed nicely. Patient verbalized understanding. Confirmed follow up appointment for 07/25/2013. Encouraged to call or email with future needs; he verbalized understanding.

## 2013-07-02 ENCOUNTER — Ambulatory Visit: Payer: Medicare Other

## 2013-07-03 ENCOUNTER — Ambulatory Visit: Payer: Medicare Other

## 2013-07-04 ENCOUNTER — Encounter: Payer: Self-pay | Admitting: Cardiothoracic Surgery

## 2013-07-04 ENCOUNTER — Ambulatory Visit (INDEPENDENT_AMBULATORY_CARE_PROVIDER_SITE_OTHER): Payer: Medicare Other | Admitting: Cardiothoracic Surgery

## 2013-07-04 VITALS — BP 93/62 | HR 92 | Resp 16 | Ht 73.0 in | Wt 205.0 lb

## 2013-07-04 DIAGNOSIS — C3410 Malignant neoplasm of upper lobe, unspecified bronchus or lung: Secondary | ICD-10-CM

## 2013-07-04 NOTE — Progress Notes (Signed)
[facility] Record #[id number] Date of Birth: Dec 19, 1940  Referring: Iona Beard, MD Primary Care: [name], MD  Chief Complaint: Right Upper Lobe Lung lesion with chest wall involvement   History of Present Illness:    Terry Robles 73 y.o. male is seen in the office  today for right upper lobe lung lesion with chest wall involvement. He has  past medical history of  dyslipidemia and benign prostatic hypertrophy as well as long history of smoking but quit 15 years ago. The patient noted   cough for more than 6 months but 4 weeks ago his cough was associated with hemoptysis. His primary care physician Dr. Iona Beard.  ordered CT scan of the chest which was performed on 04/12/2013 and it showed 8.5 x 6.7 x 8.7 CM a right Pancoast tumor invading the typical chest with bony destruction of the right second and third ribs along the tumor margin. There was confluent adenopathy related to the mass present in the right suprahilar and hilar regions. 1 lymph node measured 1.6 CM in short axis and a lower right peritracheal node measured 0.8 CM in short axis. The tumor and adjacent adenopathy cause extrinsic narrowing of the right upper lobe pulmonary artery.  A PET scan was performed in 04/25/2013. It showed a large hypermetabolic right upper lobe mass consistent with primary bronchogenic carcinoma. There is hypermetabolic right hilar metastatic lymph nodes and mild metabolic activity of right lower paratracheal lymph node concerning for metastases but indeterminate.   On 04/30/2013, the patient underwent CT-guided core biopsy of the right upper lobe lung lesion by interventional radiology.  The final pathology (Accession: [id number]) also right upper lobe biopsy was consistent with non-small cell carcinoma. The core biopsies are extensively involved by non small cell carcinoma associated with large areas of necrosis. The morphologic features favor squamous cell  carcinoma. Immunohistochemistry is performed and the tumor is positive with Cytokeratin 5/6, Cytokeratin 903 and p63. The tumor is negative with Cytokeratin 7, Napsin A, Thyroid Transcription Factor -1 (TTF-1) and Cytokeratin 20 The immunohistochemical findings are consistent with the morphologic impression of squamous cell carcinoma.   The patient had   aching pain especially on the right side of the chest radiating in  to the shoulder and down to his right arm. He also has shortness breath with exertion such as walking up hill  and mild cough productive of clear sputum but no more hemoptysis. He lost around 15 pounds in the last 4 weeks secondary to lack of appetite. He denied having any significant nausea or vomiting. He denied having any headache or blurry vision.   Concurrent chemoradiation with weekly carboplatin for an AUC of 2 and paclitaxel 45 mg per meter squared. Patient completed radiation two weeks ago today.  He notes he has had significant pain relief in rt arm and shoulder.   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   Zubrod Score: At the time of surgery this patient's most appropriate activity status/level should be described as: []     0  Normal activity, no symptoms [x]     1    Restricted in physical strenuous activity but ambulatory, able to do out light work []     2    Ambulatory and capable of self care, unable to do work activities, up and about               >50 % of waking hours                              []     3    Only limited self care, in bed greater than 50% of waking hours []     4    Completely disabled, no self care, confined to bed or  chair []     5    Moribund   Past Medical History  Diagnosis Date  . Hypercholesteremia 05/14/2011  . BPH (benign prostatic hyperplasia) 05/14/2011    Past Surgical History  Procedure Laterality Date  . Tonsillectomy    . Other surgical history      benign tumor removed from left leg several years ago  . Other surgical history      had melanoma/squamous cell removed previously per patient    Family History  Problem Relation Age of Onset  . COPD Paternal Grandfather   . Breast cancer Paternal Aunt   Family history significant for her father who died from hepatitis secondary to blood transfusion, mother died from COPD at age 2 and paternal grandmother died from pancreatic cancer, maternal grandfather died from a stroke and brother died from melanoma at age 19.   History   Social History  . Marital Status: Married    Spouse Name: N/A    Number of Children: N/A  . Years of Education: N/A   Occupational History  . Not on file.   Social History Main Topics  . Smoking status: Former Smoker -- 1.00 packs/day for 42 years    Types: Cigarettes    Quit date: 03/28/1998  . Smokeless tobacco: Never Used  . Alcohol Use: Yes     Comment: occasional/ beer - no more than one beer in 24 hrs   . Drug Use: No  . Sexual Activity: Not on file   Other Topics Concern  . Not on file   Social History Narrative  . No narrative on file    History  Smoking status  . Former Smoker -- 1.00 packs/day for 42 years  . Types: Cigarettes  . Quit date: 03/28/1998  Smokeless tobacco  . Never Used    History  Alcohol Use  . Yes    Comment: occasional/ beer - no more than one beer in 24 hrs      No Known Allergies  Current Outpatient Prescriptions  Medication Sig Dispense Refill  . docusate sodium (COLACE) 100 MG capsule Take 100 mg by mouth daily.      . hyaluronate sodium (RADIAPLEXRX) GEL Apply 1 application topically 2 (two) times daily.      . Multiple Vitamin (MULITIVITAMIN  WITH MINERALS) TABS Take 1 tablet by mouth daily.      Marland Kitchen oxyCODONE-acetaminophen (PERCOCET/ROXICET) 5-325 MG per tablet Take 1 tablet by mouth every 6 (six) hours as needed for severe pain.  60 tablet  0  . prochlorperazine (COMPAZINE) 10 MG tablet Take 1 tablet (10 mg total) by mouth every 6 (six) hours as needed for nausea or vomiting.  60 tablet  1  . sucralfate (  CARAFATE) 1 G tablet Take 1 tablet (1 g total) by mouth 4 (four) times daily -  with meals and at bedtime. Take FIVE minutes before food  120 tablet  2  . Tamsulosin HCl (FLOMAX) 0.4 MG CAPS Take 0.4 mg by mouth daily after supper.       No current facility-administered medications for this visit.     Review of Systems:     Cardiac Review of Systems: Y or N  Chest Pain [  n  ]  Resting SOB [  n ] Exertional SOB  [ mild ]  Orthopnea [  n]   Pedal Edema [ n  ]    Palpitations [n  ] Syncope  [n  ]   Presyncope [  n ]  General Review of Systems: [Y] = yes [  ]=no Constitional: recent weight change [  ];  Wt loss over the last 3 months [ 15 lbs  ] anorexia Blue.Reese  ]; fatigue [  y]; nausea [  ]; night sweats [ n ]; fever [n  ]; or chills [ n ];          Dental: poor dentition[n  ]; Last Dentist visit:   Eye : blurred vision [  ]; diplopia [   ]; vision changes [  ];  Amaurosis fugax[  ]; Resp: cough [ y ];  wheezing[  ];  hemoptysis[ y ]; shortness of breath[y  ]; paroxysmal nocturnal dyspnea[  ]; dyspnea on exertion[ y ]; or orthopnea[  ];  GI:  gallstones[  ], vomiting[  ];  dysphagia[  ]; melena[  ];  hematochezia [  ]; heartburn[  ];   Hx of  Colonoscopy[ y ]; GU: kidney stones [  ]; hematuria[  ];   dysuria [  ];  nocturia[  ];  history of     obstruction [  ]; urinary frequency [n  ]             Skin: rash, swelling[  ];, hair loss[  ];  peripheral edema[  ];  or itching[  ]; Musculosketetal: myalgias[  ];  joint swelling[  ];  joint erythema[  ];  joint pain[  ];  back pain[  ];  Heme/Lymph: bruising[  ];  bleeding[  ];  anemia[  ];    Neuro: TIA[  ];  headaches[  ];  stroke[  ];  vertigo[  ];  seizures[  ];   paresthesias[  ];  difficulty walking[n  ];  Psych:depression[  ]; anxiety[  ];  Endocrine: diabetes[ n ];  thyroid dysfunction[n  ];  Immunizations: Flu up to date [ y ]; Pneumococcal up to date Blue.Reese  ];  Other:  Physical Exam: 110/62 74 97.2 F (36.2 C) (Oral) 18 6\' 1"  (1.854 m) 219 lb 8 oz (99.565 kg)     PHYSICAL EXAMINATION:  General appearance: alert, cooperative, appears older than stated age and no distress Neurologic: intact Heart: regular rate and rhythm, S1, S2 normal, no murmur, click, rub or gallop Lungs: diminished breath sounds RUL Abdomen: soft, non-tender; bowel sounds normal; no masses,  no organomegaly Extremities: extremities normal, atraumatic, no cyanosis or edema and Homans sign is negative, no sign of DVT Patient has no cervical or supraclavicular adenopathy no axillary adenopathy, DP and PT pulses are 2+ bilaterally brachial and radial pulses are 2+ bilaterally   Diagnostic Studies & Laboratory data:     Recent Radiology Findings:  Mr Jeri Cos Wo Contrast  05/14/2013   CLINICAL DATA:  Headaches. Staging of new non-small-cell lung cancer.  EXAM: MRI HEAD WITHOUT AND WITH CONTRAST  TECHNIQUE: Multiplanar, multiecho pulse sequences of the brain and surrounding structures were obtained without and with intravenous contrast.  CONTRAST:  51mL MULTIHANCE GADOBENATE DIMEGLUMINE 529 MG/ML IV SOLN  COMPARISON:  None.  FINDINGS: Incidental note is made of a partially empty sella. There is no acute infarct. There is no evidence of intracranial hemorrhage. Ventricles and sulci are within normal limits for age. Small, scattered foci of T2 hyperintensity within the deep cerebral white matter are nonspecific but compatible with minimal, age-related small vessel ischemic change. There is no mass, midline shift or extra-axial fluid collection. There is no abnormal enhancement.  Major intracranial vascular flow  voids are unremarkable. Orbits are unremarkable. Paranasal sinuses and mastoid air cells are clear. Calvarium is normal in signal.  IMPRESSION: No evidence of intracranial metastases.   Electronically Signed   By: Logan Bores   On: 05/14/2013 15:07   Dg Chest 1 View  04/30/2013   CLINICAL DATA:  Right upper lobe mass, post percutaneous biopsy  EXAM: CHEST - 1 VIEW  COMPARISON:  04/25/2013 and earlier studies  FINDINGS: No pneumothorax. Stable right upper lobe/apical mass with probable pathologic fracture through the posterior aspect right second rib. Left lung clear. Heart size normal. No effusion.  IMPRESSION: No pneumothorax post right lung biopsy   Electronically Signed   By: Arne Cleveland M.D.   On: 04/30/2013 13:12   Ct Chest W Contrast  04/12/2013   CLINICAL DATA:  Hemoptysis.  EXAM: CT CHEST WITH CONTRAST  TECHNIQUE: Multidetector CT imaging of the chest was performed during intravenous contrast administration.  CONTRAST:  173mL OMNIPAQUE IOHEXOL 300 MG/ML  SOLN  COMPARISON:  DG CHEST 1V PORT dated 05/14/2011  FINDINGS: 8.5 x 6.7 by 8.7 cm right Pancoast tumor invades the typical chest wall with bony destruction of the right second and third ribs along the tumor margin. Confluent adenopathy related to the mass is present in the right suprahilar and hilar regions. One hilar node on image 27 of series 2 measures 1.6 cm in short axis. A lower right paratracheal node measures 0.8 cm in short axis on image 20 of series 2.  The tumor and adjacent adenopathy cause extrinsic narrowing of the right upper lobe pulmonary artery. Please note that although today's exam does not show obvious clot in the large pulmonary arteries, it was not protocol does a CT angiography examination.  The smoothly marginated 5.3 x 1.9 by 3.8 cm multilobulated subcutaneous lesion with internal density of approximately 44 Hounsfield units is present along the left anterolateral chest wall. Faint posterior calcification along the margin  of this lesion.  Thoracic spondylosis noted. Centrilobular emphysema. 3 mm left upper lobe pulmonary nodule, image 28 of series 3. 4 mm left lower lobe pulmonary nodule, image 42 of series 3. 0.8 x 0.6 cm subpleural nodule in the left lower lobe, image 35 of series 3.  IMPRESSION: 1. 8.5 cm right apical lung cancer (Pancoast tumor) invades the right apical/posterior chest wall with bony invasion of the right second and third ribs. Confluent right hilar and suprahilar adenopathy. 2. Several left-sided pulmonary ill nodules, largest 7 mm in average size, nonspecific for benign versus malignant nodules. 3. Smoothly marginated and fairly homogeneous 5.3 x 1.9 x 3.8 cm multilobulated subcutaneous lesion if along the left lateral chest wall noted. This is homogeneous could represent a ganglion cyst or benign lesion. A subcutaneous  metastatic lesion would be unusual but not impossible in light of the right lung findings. This lesion is probably readily palpable; a long patient history of its palpable lesion in this vicinity without significant change would favor a benign etiology for this left-sided lesion.   Electronically Signed   By: Sherryl Barters M.D.   On: 04/12/2013 09:25   Nm Pet Image Initial (pi) Skull Base To Thigh  04/25/2013   CLINICAL DATA:  Initial treatment strategy for lung mass.  EXAM: NUCLEAR MEDICINE PET SKULL BASE TO THIGH  FASTING BLOOD GLUCOSE:  Value: 104mg /dl  TECHNIQUE: 16.6 mCi F-18 FDG was injected intravenously. CT data was obtained and used for attenuation correction and anatomic localization only. (This was not acquired as a diagnostic CT examination.) Additional exam technical data entered on technologist worksheet.  COMPARISON:  DG CHEST 1V PORT dated 05/14/2011; CT CHEST W/CM dated 04/12/2013  FINDINGS: NECK  No hypermetabolic lymph nodes in the neck.  CHEST  Large right upper lobe mass measuring 7.6 x 7.1 cm has intense metabolic activity with SUV max 25.9. There is a hypermetabolic  right hilar lymph node measuring 22 mm (image 93, series 2) with SUV max 21.7.  There is a mildly enlarged right lower paratracheal node measuring 12 mm short axis (image 80) with mild metabolic activity (SUV max 3.4). No supraclavicular or contralateral hypermetabolic nodes.  ABDOMEN/PELVIS  No abnormal hypermetabolic activity within the liver, pancreas, adrenal glands, or spleen. No hypermetabolic lymph nodes in the abdomen or pelvis.  SKELETON  No focal hypermetabolic activity to suggest skeletal metastasis.  IMPRESSION: 1. Large hypermetabolic right upper lobe mass consists with primary bronchogenic carcinoma. 2. Hypermetabolic right hilar metastatic lymph node. 3. Mild metabolic activity of right lower paratracheal lymph node is concerning metastasis but indeterminate. 4. Staging by FDG PET-CT imaging T3 N2 M0   Electronically Signed   By: Suzy Bouchard M.D.   On: 04/25/2013 09:38   Ct Biopsy  04/30/2013   CLINICAL DATA:  Hypermetabolic right apical lung lesion with chest wall involvement  EXAM: CT GUIDED CORE BIOPSY OF RIGHT UPPER LOBE LUNG LESION  ANESTHESIA/SEDATION: Intravenous Fentanyl and Versed were administered as conscious sedation during continuous cardiorespiratory monitoring by the radiology RN, with a total moderate sedation time of 7 minutes.  PROCEDURE: The procedure risks, benefits, and alternatives were explained to the patient. Questions regarding the procedure were encouraged and answered. The patient understands and consents to the procedure.  The posterior right upper chest was prepped with betadinein a sterile fashion, and a sterile drape was applied covering the operative field. A sterile gown and sterile gloves were used for the procedure. Local anesthesia was provided with 1% Lidocaine.  Under CT fluoroscopic guidance, a 17 gauge trocar needle was advanced to the margin of the lesion. Once needle tip position was confirmed, coaxial 18-gauge core biopsy samples were obtained,  submitted in formalin to surgical pathology. The guide needle was removed. Postprocedure scans show no pneumothorax or hemorrhage. The patient tolerated the procedure well.  Complications: none  FINDINGS: Right apical lung mass with posterior chest wall involvement. Technically successful core biopsy as above.  IMPRESSION: 1. Technically successful right upper lobe lung core needle biopsy. Observation and follow-up radiography scheduled.   Electronically Signed   By: Arne Cleveland M.D.   On: 04/30/2013 13:35      Recent Lab Findings: Lab Results  Component Value Date   WBC 5.1 06/17/2013   HGB 11.7* 06/17/2013   HCT 35.2* 06/17/2013  PLT 199 06/17/2013   GLUCOSE 107 06/17/2013   CHOL 161 05/14/2011   TRIG 146 05/14/2011   HDL 49 05/14/2011   LDLCALC 83 05/14/2011   ALT 26 06/17/2013   AST 18 06/17/2013   NA 139 06/17/2013   K 4.3 06/17/2013   CL 102 05/15/2011   CREATININE 0.8 06/17/2013   BUN 15.2 06/17/2013   CO2 24 06/17/2013   INR 1.01 04/30/2013   PFT's  2.89 79%  DLCO 19.97  53%   Assessment / Plan:    Squamous cell carcinoma of the lung/Pancoast tumor right superior sulcus clinical stage   cT3,cN2,cM0  Clincial  Stage IIIa   Mild metabolic activity of right lower paratracheal lymph node is  concerning metastasis but indeterminate.  PFT's done  MRI of Brain negative Patient has  tolerating concurrent radiation and chemotherapy treatment,  Patient has a follow up ct of the chest April 29 I will see just after the scan and review at [facility] to decide on suitability of resection.   Grace Isaac MD      [address] [place] [address] Office [phone]   Beeper [phone]  07/04/2013 12:19 PM

## 2013-07-24 ENCOUNTER — Other Ambulatory Visit (HOSPITAL_BASED_OUTPATIENT_CLINIC_OR_DEPARTMENT_OTHER): Payer: Medicare Other

## 2013-07-24 ENCOUNTER — Encounter (HOSPITAL_COMMUNITY): Payer: Self-pay

## 2013-07-24 ENCOUNTER — Ambulatory Visit (HOSPITAL_COMMUNITY)
Admission: RE | Admit: 2013-07-24 | Discharge: 2013-07-24 | Disposition: A | Discharge status: Home / Self Care | Payer: Medicare Other | Source: Ambulatory Visit | Attending: Internal Medicine | Admitting: Internal Medicine

## 2013-07-24 DIAGNOSIS — Z9221 Personal history of antineoplastic chemotherapy: Secondary | ICD-10-CM | POA: Insufficient documentation

## 2013-07-24 DIAGNOSIS — R599 Enlarged lymph nodes, unspecified: Secondary | ICD-10-CM | POA: Insufficient documentation

## 2013-07-24 DIAGNOSIS — C3410 Malignant neoplasm of upper lobe, unspecified bronchus or lung: Secondary | ICD-10-CM | POA: Insufficient documentation

## 2013-07-24 LAB — CBC WITH DIFFERENTIAL/PLATELET
BASO%: 0.7 % (ref 0.0–2.0)
Basophils Absolute: 0 10*3/uL (ref 0.0–0.1)
EOS%: 2.8 % (ref 0.0–7.0)
Eosinophils Absolute: 0.1 10*3/uL (ref 0.0–0.5)
HEMATOCRIT: 35.7 % — AB (ref 38.4–49.9)
HGB: 11.7 g/dL — ABNORMAL LOW (ref 13.0–17.1)
LYMPH#: 0.6 10*3/uL — AB (ref 0.9–3.3)
LYMPH%: 13.6 % — ABNORMAL LOW (ref 14.0–49.0)
MCH: 32.1 pg (ref 27.2–33.4)
MCHC: 32.8 g/dL (ref 32.0–36.0)
MCV: 98 fL (ref 79.3–98.0)
MONO#: 0.8 10*3/uL (ref 0.1–0.9)
MONO%: 17.2 % — ABNORMAL HIGH (ref 0.0–14.0)
NEUT#: 3.1 10*3/uL (ref 1.5–6.5)
NEUT%: 65.7 % (ref 39.0–75.0)
Platelets: 272 10*3/uL (ref 140–400)
RBC: 3.64 10*6/uL — AB (ref 4.20–5.82)
RDW: 22 % — ABNORMAL HIGH (ref 11.0–14.6)
WBC: 4.7 10*3/uL (ref 4.0–10.3)

## 2013-07-24 LAB — COMPREHENSIVE METABOLIC PANEL (CC13)
ALBUMIN: 3.3 g/dL — AB (ref 3.5–5.0)
ALT: 9 U/L (ref 0–55)
ANION GAP: 8 meq/L (ref 3–11)
AST: 14 U/L (ref 5–34)
Alkaline Phosphatase: 84 U/L (ref 40–150)
BUN: 12.9 mg/dL (ref 7.0–26.0)
CHLORIDE: 109 meq/L (ref 98–109)
CO2: 24 meq/L (ref 22–29)
CREATININE: 1 mg/dL (ref 0.7–1.3)
Calcium: 9.8 mg/dL (ref 8.4–10.4)
Glucose: 99 mg/dl (ref 70–140)
POTASSIUM: 4.5 meq/L (ref 3.5–5.1)
SODIUM: 141 meq/L (ref 136–145)
Total Bilirubin: 0.51 mg/dL (ref 0.20–1.20)
Total Protein: 6.9 g/dL (ref 6.4–8.3)

## 2013-07-24 MED ORDER — IOHEXOL 300 MG/ML  SOLN
80.0000 mL | Freq: Once | INTRAMUSCULAR | Status: AC | PRN
  Administered 2013-07-24: 80 mL via INTRAVENOUS

## 2013-07-25 ENCOUNTER — Ambulatory Visit (INDEPENDENT_AMBULATORY_CARE_PROVIDER_SITE_OTHER): Payer: Medicare Other | Admitting: Cardiothoracic Surgery

## 2013-07-25 ENCOUNTER — Other Ambulatory Visit: Payer: Self-pay | Admitting: *Deleted

## 2013-07-25 ENCOUNTER — Ambulatory Visit
Admission: RE | Admit: 2013-07-25 | Discharge: 2013-07-25 | Disposition: A | Discharge status: Home / Self Care | Payer: Medicare Other | Source: Ambulatory Visit | Attending: Radiation Oncology | Admitting: Radiation Oncology

## 2013-07-25 ENCOUNTER — Encounter: Payer: Self-pay | Admitting: Radiation Oncology

## 2013-07-25 ENCOUNTER — Encounter: Payer: Self-pay | Admitting: Cardiothoracic Surgery

## 2013-07-25 ENCOUNTER — Encounter (HOSPITAL_COMMUNITY): Payer: Self-pay | Admitting: Pharmacy Technician

## 2013-07-25 VITALS — BP 109/69 | HR 93 | Resp 20 | Ht 73.0 in | Wt 205.0 lb

## 2013-07-25 VITALS — BP 115/62 | HR 80 | Resp 16 | Wt 212.0 lb

## 2013-07-25 DIAGNOSIS — C3410 Malignant neoplasm of upper lobe, unspecified bronchus or lung: Secondary | ICD-10-CM

## 2013-07-25 DIAGNOSIS — R911 Solitary pulmonary nodule: Secondary | ICD-10-CM

## 2013-07-25 NOTE — Progress Notes (Signed)
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  The patient has significant improvement in right shoulder pain following preoperative chemoradiation. He is otherwise without complaint. Thoracic surgery plans to proceed with mediastinal lymph node sampling followed by right upper lobectomy if the lymph nodes are negative next week.                             ALLERGIES:  has No Known Allergies.  Meds: Current Outpatient Prescriptions  Medication Sig Dispense Refill  . cycloSPORINE (RESTASIS) 0.05 % ophthalmic emulsion 1 drop 2 (two) times daily.      . Multiple Vitamin (MULITIVITAMIN WITH MINERALS) TABS Take 1 tablet by mouth daily.      Marland Kitchen oxyCODONE-acetaminophen (PERCOCET/ROXICET) 5-325 MG per tablet Take 1 tablet by mouth every 6 (six) hours as needed for severe pain.  60 tablet  0  . Tamsulosin HCl (FLOMAX) 0.4 MG CAPS Take 0.4 mg by mouth daily after supper.      . docusate sodium (COLACE) 100 MG capsule Take 100 mg by mouth daily.      . hyaluronate sodium (RADIAPLEXRX) GEL Apply 1 application topically 2 (two) times daily.      . prochlorperazine (COMPAZINE) 10 MG tablet Take 1 tablet (10 mg total) by mouth every 6 (six) hours as needed for nausea or vomiting.  60 tablet  1  . sucralfate (CARAFATE) 1 G tablet Take 1 tablet (1 g total) by mouth 4 (four) times daily -  with meals and at bedtime. Take FIVE minutes before food  120 tablet  2   No current facility-administered medications for this encounter.    Physical Findings: The patient is in no acute  distress. Patient is alert and oriented.  weight is 212 lb (96.163 kg). His blood pressure is 115/62 and his pulse is 80. His respiration is 16 and oxygen saturation is 100%. .  No significant changes.  Lab Findings: Lab Results  Component Value Date   WBC 4.7 07/24/2013   HGB 11.7* 07/24/2013   HCT 35.7* 07/24/2013   MCV 98.0 07/24/2013   PLT 272 07/24/2013    Radiographic Findings: Ct Chest W Contrast  07/24/2013   CLINICAL DATA:  Lung cancer restaging, chemotherapy ongoing  EXAM: CT CHEST WITH CONTRAST  TECHNIQUE: Multidetector CT imaging of the chest was performed during intravenous contrast administration.  CONTRAST:  69mL OMNIPAQUE IOHEXOL 300 MG/ML  SOLN  COMPARISON:  PET-CT dated 04/25/2013.  CT chest dated 04/12/2013.  FINDINGS: 5.9 x 7.0 x 6.7 cm posterior right upper lobe pulmonary mass (series 6/ image 10), previously 6.7 x 8.5 x 8.7 cm on prior CT. Mass invades the chest wall at the posterior right lung apex (series 3/ image 6). Mass invades the right posterior 2nd and 3rd ribs (series 3/image 5).  Mild patchy/ground-glass opacities in the superior segment right lower lobe (series 6/images 21-22), new, possibly infectious/ inflammatory. No suspicious pulmonary nodules. Moderate centrilobular and paraseptal emphysematous changes.  No pleural effusion or pneumothorax.  Visualized thyroid is unremarkable.  The heart is normal in size. No pericardial effusion. Coronary atherosclerosis. Atherosclerotic calcifications of the aortic arch.  6 mm short axis right paratracheal node (series 3/image 16), previously 8 mm. 7 mm short axis right hilar node (series 3/ image 21), previously 16 mm. 5 mm short axis right infrahilar/ interlobar node (series 3/ image 29), previously 7 mm.  Visualized upper abdomen is notable for layering gallstones (series 3/ image 61).  Stable 5.3 x 2.0 cm subcutaneous soft tissue lesion in the left anterior chest wall (series 3/ image 43), non FDG avid.  Degenerative changes of  the visualized thoracolumbar spine.  IMPRESSION: 7.0 cm posterior right upper lobe pulmonary mass, corresponding to known primary bronchogenic neoplasm.  Stable invasion into the right upper chest wall/ribs.  Associated mild thoracic lymphadenopathy, decreased.   Electronically Signed   By: Julian Hy M.D.   On: 07/24/2013 14:01    Impression:  The patient is recovering from the effects of radiation.  His tumor has regressed to some extent following preoperative chemoradiotherapy.  Plan:  I will look forward to seeing him back in 3 months following right upper lobectomy. If positive mediastinal lymph nodes are discovered, the patient may be eligible for a two-week radiation boost to the primary tumor site to help improve his likelihood for durable local control.  _____________________________________  Sheral Apley. Tammi Klippel, M.D.

## 2013-07-25 NOTE — Progress Notes (Signed)
[address]       [place] [address]             [phone]                      Terry Robles [facility] Medical Record #[id number] Date of Birth: [birth year]  Referring: Iona Beard, MD Primary Care: [name], MD  Chief Complaint: Right Upper Lobe Lung lesion with chest wall involvement   History of Present Illness:    Terry Robles 73 y.o. male is seen in the office  today for right upper lobe lung lesion with chest wall involvement. He has  past medical history of  dyslipidemia and benign prostatic hypertrophy as well as long history of smoking but quit 15 years ago. The patient noted   cough for more than 6 months but 4 weeks ago his cough was associated with hemoptysis. His primary care physician Dr. Iona Beard.  ordered CT scan of the chest which was performed on 04/12/2013 and it showed 8.5 x 6.7 x 8.7 CM a right Pancoast tumor invading the typical chest with bony destruction of the right second and third ribs along the tumor margin. There was confluent adenopathy related to the mass present in the right suprahilar and hilar regions. 1 lymph node measured 1.6 CM in short axis and a lower right peritracheal node measured 0.8 CM in short axis. The tumor and adjacent adenopathy cause extrinsic narrowing of the right upper lobe pulmonary artery.  A PET scan was performed in 04/25/2013. It showed a large hypermetabolic right upper lobe mass consistent with primary bronchogenic carcinoma. There is hypermetabolic right hilar metastatic lymph nodes and mild metabolic activity of right lower paratracheal lymph node concerning for metastases but indeterminate.   On 04/30/2013, the patient underwent CT-guided core biopsy of the right upper lobe lung lesion by interventional radiology.  The final pathology (Accession: [id number]) also right upper lobe biopsy was consistent with non-small cell carcinoma. The core biopsies are extensively involved by non small cell  carcinoma associated with large areas of necrosis. The morphologic features favor squamous cell carcinoma. Immunohistochemistry is performed and the tumor is positive with Cytokeratin 5/6, Cytokeratin 903 and p63. The tumor is negative with Cytokeratin 7, Napsin A, Thyroid Transcription Factor -1 (TTF-1) and Cytokeratin 20 The immunohistochemical findings are consistent with the morphologic impression of squamous cell carcinoma.    Concurrent chemoradiation with weekly carboplatin for an AUC of 2 and paclitaxel 45 mg per meter squared. Patient completed radiation two weeks ago today.  Patient notes that he has significantly gained strength over the past month as he is recovered from radiation and chemotherapy. The right shoulder in back pain has significantly improved. He denies shortness of breath, he returns today with a followup CT scan of the chest to evaluate response from the previous chemotherapy and radiation and for consideration of surgical resection.    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   Zubrod Score: At the time of surgery this patient's most appropriate activity status/level should be described as: []     0  Normal activity, no symptoms [x]     1    Restricted in physical strenuous activity but ambulatory, able to do out light work []     2    Ambulatory and capable of self care, unable to do work activities, up and about               >50 % of waking hours                              []     3    Only limited self care, in bed greater than 50% of waking hours []     4    Completely disabled, no self care, confined to bed or chair []     5     Moribund   Past Medical History  Diagnosis Date  . Hypercholesteremia 05/14/2011  . BPH (benign prostatic hyperplasia) 05/14/2011    Past Surgical History  Procedure Laterality Date  . Tonsillectomy    . Other surgical history      benign tumor removed from left leg several years ago  . Other surgical history      had melanoma/squamous cell removed previously per patient    Family History  Problem Relation Age of Onset  . COPD Paternal Grandfather   . Breast cancer Paternal Aunt   Family history significant for her father who died from hepatitis secondary to blood transfusion, mother died from COPD at age 3 and paternal grandmother died from pancreatic cancer, maternal grandfather died from a stroke and brother died from melanoma at age 58.   History   Social History  . Marital Status: Married    Spouse Name: N/A    Number of Children: N/A  . Years of Education: N/A   Occupational History  . Not on file.   Social History Main Topics  . Smoking status: Former Smoker -- 1.00 packs/day for 42 years    Types: Cigarettes    Quit date: 03/28/1998  . Smokeless tobacco: Never Used  . Alcohol Use: Yes     Comment: occasional/ beer - no more than one beer in 24 hrs   . Drug Use: No  . Sexual Activity: Not on file   Other Topics Concern  . Not on file   Social History Narrative  . No narrative on file    History  Smoking status  . Former Smoker -- 1.00 packs/day for 42 years  . Types: Cigarettes  . Quit date: 03/28/1998  Smokeless tobacco  . Never Used    History  Alcohol Use  . Yes    Comment: occasional/ beer - no more than one beer in 24 hrs      No Known Allergies  Current Outpatient Prescriptions  Medication Sig Dispense Refill  . docusate sodium (COLACE) 100 MG capsule Take 100 mg by mouth daily.      . hyaluronate sodium (RADIAPLEXRX) GEL Apply 1 application topically 2 (two) times daily.      . Multiple Vitamin (MULITIVITAMIN WITH MINERALS) TABS  Take 1 tablet by mouth daily.      Marland Kitchen oxyCODONE-acetaminophen (PERCOCET/ROXICET) 5-325 MG per tablet Take 1 tablet by mouth every 6 (six) hours as needed for severe pain.  60 tablet  0  . prochlorperazine (COMPAZINE) 10 MG tablet Take 1 tablet (10 mg total) by mouth every 6 (six) hours as needed for nausea or vomiting.  60 tablet  1  . sucralfate (  CARAFATE) 1 G tablet Take 1 tablet (1 g total) by mouth 4 (four) times daily -  with meals and at bedtime. Take FIVE minutes before food  120 tablet  2  . Tamsulosin HCl (FLOMAX) 0.4 MG CAPS Take 0.4 mg by mouth daily after supper.       No current facility-administered medications for this visit.     Review of Systems:     Cardiac Review of Systems: Y or N  Chest Pain [  n  ]  Resting SOB [  n ] Exertional SOB  [ mild ]  Orthopnea [  n]   Pedal Edema [ n  ]    Palpitations [n  ] Syncope  [n  ]   Presyncope [  n ]  General Review of Systems: [Y] = yes [  ]=no Constitional: recent weight change [  ];  Wt loss over the last 3 months [ 15 lbs  ] anorexia Blue.Reese  ]; fatigue [  y]; nausea [  ]; night sweats [ n ]; fever [n  ]; or chills [ n ];          Dental: poor dentition[n  ]; Last Dentist visit:   Eye : blurred vision [  ]; diplopia [   ]; vision changes [  ];  Amaurosis fugax[  ]; Resp: cough [ y ];  wheezing[  ];  hemoptysis[ y ]; shortness of breath[y  ]; paroxysmal nocturnal dyspnea[  ]; dyspnea on exertion[ y ]; or orthopnea[  ];  GI:  gallstones[  ], vomiting[  ];  dysphagia[  ]; melena[  ];  hematochezia [  ]; heartburn[  ];   Hx of  Colonoscopy[ y ]; GU: kidney stones [  ]; hematuria[  ];   dysuria [  ];  nocturia[  ];  history of     obstruction [  ]; urinary frequency [n  ]             Skin: rash, swelling[  ];, hair loss[  ];  peripheral edema[  ];  or itching[  ]; Musculosketetal: myalgias[  ];  joint swelling[  ];  joint erythema[  ];  joint pain[  ];  back pain[  ];  Heme/Lymph: bruising[  ];  bleeding[  ];  anemia[  ];  Neuro: TIA[  ];   headaches[  ];  stroke[  ];  vertigo[  ];  seizures[  ];   paresthesias[  ];  difficulty walking[n  ];  Psych:depression[  ]; anxiety[  ];  Endocrine: diabetes[ n ];  thyroid dysfunction[n  ];  Immunizations: Flu up to date [ y ]; Pneumococcal up to date Blue.Reese  ];  Other:  Physical Exam: 110/62 74 97.2 F (36.2 C) (Oral) 18 6\' 1"  (1.854 m) 219 lb 8 oz (99.565 kg)    Wt Readings from Last 3 Encounters:  07/25/13 205 lb (92.987 kg)  07/04/13 205 lb (92.987 kg)  06/21/13 207 lb 9.6 oz (94.167 kg)    PHYSICAL EXAMINATION:  General appearance: alert, cooperative, appears older than stated age and no distress Neurologic: intact Heart: regular rate and rhythm, S1, S2 normal, no murmur, click, rub or gallop Lungs: diminished breath sounds RUL Abdomen: soft, non-tender; bowel sounds normal; no masses,  no organomegaly Extremities: extremities normal, atraumatic, no cyanosis or edema and Homans sign is negative, no sign of DVT Patient has no cervical or supraclavicular adenopathy no axillary adenopathy, DP and PT pulses are 2+ bilaterally brachial  and radial pulses are 2+ bilaterally   Diagnostic Studies & Laboratory data:     Recent Radiology Findings:  Ct Chest W Contrast  07/24/2013   CLINICAL DATA:  Lung cancer restaging, chemotherapy ongoing  EXAM: CT CHEST WITH CONTRAST  TECHNIQUE: Multidetector CT imaging of the chest was performed during intravenous contrast administration.  CONTRAST:  42mL OMNIPAQUE IOHEXOL 300 MG/ML  SOLN  COMPARISON:  PET-CT dated 04/25/2013.  CT chest dated 04/12/2013.  FINDINGS: 5.9 x 7.0 x 6.7 cm posterior right upper lobe pulmonary mass (series 6/ image 10), previously 6.7 x 8.5 x 8.7 cm on prior CT. Mass invades the chest wall at the posterior right lung apex (series 3/ image 6). Mass invades the right posterior 2nd and 3rd ribs (series 3/image 5).  Mild patchy/ground-glass opacities in the superior segment right lower lobe (series 6/images 21-22), new, possibly  infectious/ inflammatory. No suspicious pulmonary nodules. Moderate centrilobular and paraseptal emphysematous changes.  No pleural effusion or pneumothorax.  Visualized thyroid is unremarkable.  The heart is normal in size. No pericardial effusion. Coronary atherosclerosis. Atherosclerotic calcifications of the aortic arch.  6 mm short axis right paratracheal node (series 3/image 16), previously 8 mm. 7 mm short axis right hilar node (series 3/ image 21), previously 16 mm. 5 mm short axis right infrahilar/ interlobar node (series 3/ image 29), previously 7 mm.  Visualized upper abdomen is notable for layering gallstones (series 3/ image 61).  Stable 5.3 x 2.0 cm subcutaneous soft tissue lesion in the left anterior chest wall (series 3/ image 43), non FDG avid.  Degenerative changes of the visualized thoracolumbar spine.  IMPRESSION: 7.0 cm posterior right upper lobe pulmonary mass, corresponding to known primary bronchogenic neoplasm.  Stable invasion into the right upper chest wall/ribs.  Associated mild thoracic lymphadenopathy, decreased.   Electronically Signed   By: Julian Hy M.D.   On: 07/24/2013 14:01  Mr Jeri Cos OF Contrast  05/14/2013   CLINICAL DATA:  Headaches. Staging of new non-small-cell lung cancer.  EXAM: MRI HEAD WITHOUT AND WITH CONTRAST  TECHNIQUE: Multiplanar, multiecho pulse sequences of the brain and surrounding structures were obtained without and with intravenous contrast.  CONTRAST:  52mL MULTIHANCE GADOBENATE DIMEGLUMINE 529 MG/ML IV SOLN  COMPARISON:  None.  FINDINGS: Incidental note is made of a partially empty sella. There is no acute infarct. There is no evidence of intracranial hemorrhage. Ventricles and sulci are within normal limits for age. Small, scattered foci of T2 hyperintensity within the deep cerebral white matter are nonspecific but compatible with minimal, age-related small vessel ischemic change. There is no mass, midline shift or extra-axial fluid collection.  There is no abnormal enhancement.  Major intracranial vascular flow voids are unremarkable. Orbits are unremarkable. Paranasal sinuses and mastoid air cells are clear. Calvarium is normal in signal.  IMPRESSION: No evidence of intracranial metastases.   Electronically Signed   By: Logan Bores   On: 05/14/2013 15:07   Dg Chest 1 View  04/30/2013   CLINICAL DATA:  Right upper lobe mass, post percutaneous biopsy  EXAM: CHEST - 1 VIEW  COMPARISON:  04/25/2013 and earlier studies  FINDINGS: No pneumothorax. Stable right upper lobe/apical mass with probable pathologic fracture through the posterior aspect right second rib. Left lung clear. Heart size normal. No effusion.  IMPRESSION: No pneumothorax post right lung biopsy   Electronically Signed   By: Arne Cleveland M.D.   On: 04/30/2013 13:12   Ct Chest W Contrast  04/12/2013   CLINICAL  DATA:  Hemoptysis.  EXAM: CT CHEST WITH CONTRAST  TECHNIQUE: Multidetector CT imaging of the chest was performed during intravenous contrast administration.  CONTRAST:  11mL OMNIPAQUE IOHEXOL 300 MG/ML  SOLN  COMPARISON:  DG CHEST 1V PORT dated 05/14/2011  FINDINGS: 8.5 x 6.7 by 8.7 cm right Pancoast tumor invades the typical chest wall with bony destruction of the right second and third ribs along the tumor margin. Confluent adenopathy related to the mass is present in the right suprahilar and hilar regions. One hilar node on image 27 of series 2 measures 1.6 cm in short axis. A lower right paratracheal node measures 0.8 cm in short axis on image 20 of series 2.  The tumor and adjacent adenopathy cause extrinsic narrowing of the right upper lobe pulmonary artery. Please note that although today's exam does not show obvious clot in the large pulmonary arteries, it was not protocol does a CT angiography examination.  The smoothly marginated 5.3 x 1.9 by 3.8 cm multilobulated subcutaneous lesion with internal density of approximately 44 Hounsfield units is present along the left  anterolateral chest wall. Faint posterior calcification along the margin of this lesion.  Thoracic spondylosis noted. Centrilobular emphysema. 3 mm left upper lobe pulmonary nodule, image 28 of series 3. 4 mm left lower lobe pulmonary nodule, image 42 of series 3. 0.8 x 0.6 cm subpleural nodule in the left lower lobe, image 35 of series 3.  IMPRESSION: 1. 8.5 cm right apical lung cancer (Pancoast tumor) invades the right apical/posterior chest wall with bony invasion of the right second and third ribs. Confluent right hilar and suprahilar adenopathy. 2. Several left-sided pulmonary ill nodules, largest 7 mm in average size, nonspecific for benign versus malignant nodules. 3. Smoothly marginated and fairly homogeneous 5.3 x 1.9 x 3.8 cm multilobulated subcutaneous lesion if along the left lateral chest wall noted. This is homogeneous could represent a ganglion cyst or benign lesion. A subcutaneous metastatic lesion would be unusual but not impossible in light of the right lung findings. This lesion is probably readily palpable; a long patient history of its palpable lesion in this vicinity without significant change would favor a benign etiology for this left-sided lesion.   Electronically Signed   By: Sherryl Barters M.D.   On: 04/12/2013 09:25   Nm Pet Image Initial (pi) Skull Base To Thigh  04/25/2013   CLINICAL DATA:  Initial treatment strategy for lung mass.  EXAM: NUCLEAR MEDICINE PET SKULL BASE TO THIGH  FASTING BLOOD GLUCOSE:  Value: 104mg /dl  TECHNIQUE: 16.6 mCi F-18 FDG was injected intravenously. CT data was obtained and used for attenuation correction and anatomic localization only. (This was not acquired as a diagnostic CT examination.) Additional exam technical data entered on technologist worksheet.  COMPARISON:  DG CHEST 1V PORT dated 05/14/2011; CT CHEST W/CM dated 04/12/2013  FINDINGS: NECK  No hypermetabolic lymph nodes in the neck.  CHEST  Large right upper lobe mass measuring 7.6 x 7.1 cm has  intense metabolic activity with SUV max 25.9. There is a hypermetabolic right hilar lymph node measuring 22 mm (image 93, series 2) with SUV max 21.7.  There is a mildly enlarged right lower paratracheal node measuring 12 mm short axis (image 80) with mild metabolic activity (SUV max 3.4). No supraclavicular or contralateral hypermetabolic nodes.  ABDOMEN/PELVIS  No abnormal hypermetabolic activity within the liver, pancreas, adrenal glands, or spleen. No hypermetabolic lymph nodes in the abdomen or pelvis.  SKELETON  No focal hypermetabolic activity to suggest  skeletal metastasis.  IMPRESSION: 1. Large hypermetabolic right upper lobe mass consists with primary bronchogenic carcinoma. 2. Hypermetabolic right hilar metastatic lymph node. 3. Mild metabolic activity of right lower paratracheal lymph node is concerning metastasis but indeterminate. 4. Staging by FDG PET-CT imaging T3 N2 M0   Electronically Signed   By: Suzy Bouchard M.D.   On: 04/25/2013 09:38   Ct Biopsy  04/30/2013   CLINICAL DATA:  Hypermetabolic right apical lung lesion with chest wall involvement  EXAM: CT GUIDED CORE BIOPSY OF RIGHT UPPER LOBE LUNG LESION  ANESTHESIA/SEDATION: Intravenous Fentanyl and Versed were administered as conscious sedation during continuous cardiorespiratory monitoring by the radiology RN, with a total moderate sedation time of 7 minutes.  PROCEDURE: The procedure risks, benefits, and alternatives were explained to the patient. Questions regarding the procedure were encouraged and answered. The patient understands and consents to the procedure.  The posterior right upper chest was prepped with betadinein a sterile fashion, and a sterile drape was applied covering the operative field. A sterile gown and sterile gloves were used for the procedure. Local anesthesia was provided with 1% Lidocaine.  Under CT fluoroscopic guidance, a 17 gauge trocar needle was advanced to the margin of the lesion. Once needle tip position  was confirmed, coaxial 18-gauge core biopsy samples were obtained, submitted in formalin to surgical pathology. The guide needle was removed. Postprocedure scans show no pneumothorax or hemorrhage. The patient tolerated the procedure well.  Complications: none  FINDINGS: Right apical lung mass with posterior chest wall involvement. Technically successful core biopsy as above.  IMPRESSION: 1. Technically successful right upper lobe lung core needle biopsy. Observation and follow-up radiography scheduled.   Electronically Signed   By: Arne Cleveland M.D.   On: 04/30/2013 13:35      Recent Lab Findings: Lab Results  Component Value Date   WBC 4.7 07/24/2013   HGB 11.7* 07/24/2013   HCT 35.7* 07/24/2013   PLT 272 07/24/2013   GLUCOSE 99 07/24/2013   CHOL 161 05/14/2011   TRIG 146 05/14/2011   HDL 49 05/14/2011   LDLCALC 83 05/14/2011   ALT 9 07/24/2013   AST 14 07/24/2013   NA 141 07/24/2013   K 4.5 07/24/2013   CL 102 05/15/2011   CREATININE 1.0 07/24/2013   BUN 12.9 07/24/2013   CO2 24 07/24/2013   INR 1.01 04/30/2013   PFT's  2.89 79%  DLCO 19.97  53%   Assessment / Plan:   Squamous cell carcinoma of the lung/Pancoast tumor right superior sulcus clinical stage   cT3,cN2,cM0  Clincial  Stage IIIa   Mild metabolic activity of right lower paratracheal lymph node is  concerning metastasis but indeterminate.  MRI of Brain negative Patient has  tolerating concurrent radiation and chemotherapy treatment well with response, decreasing size of the mass and decreasing size of the sternal lymph nodes The patient's followup CT scan was reviewed today in the multidisciplinary thoracic oncology conference. With the patient's overall  Good physical condition and significant response to preoperative treatment is been recommended to the patient that we proceed with bronchoscopy ebus possible mediastinoscopy right video-assisted thoracoscopy lung resection and chest wall resection. The significance of this procedure  has been discussed with the patient and his wife in detail. If the mediastinal lymph nodes are positive on initial exam we will not proceed with resection but continue with current chemoradiation, if with ebus and or mediastinoscopy mediastinal nodes are negative we will proceed at the same setting with resection. Tentatively  planned for May 5.   Grace Isaac MD      [address] [place] [address] Office [phone]   Beeper [phone]  07/25/2013 1:02 PM

## 2013-07-25 NOTE — Progress Notes (Signed)
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 Reports he "once in a blue moon" takes percocet for right shoulder pain when he "over does it." Denies headache, dizziness, nausea or vomiting. Weight stable. Reports a rare cough. Reports shortness of breath with exertion. Denies difficulty swallowing. 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

## 2013-07-26 ENCOUNTER — Encounter (HOSPITAL_COMMUNITY)
Admission: RE | Admit: 2013-07-26 | Discharge: 2013-07-26 | Disposition: A | Discharge status: Home / Self Care | Payer: Medicare Other | Source: Ambulatory Visit | Attending: Cardiothoracic Surgery | Admitting: Cardiothoracic Surgery

## 2013-07-26 ENCOUNTER — Encounter (HOSPITAL_COMMUNITY): Payer: Self-pay

## 2013-07-26 VITALS — BP 104/68 | HR 94 | Temp 98.2°F | Resp 16 | Ht 73.5 in | Wt 211.2 lb

## 2013-07-26 DIAGNOSIS — R911 Solitary pulmonary nodule: Secondary | ICD-10-CM

## 2013-07-26 DIAGNOSIS — R0602 Shortness of breath: Secondary | ICD-10-CM | POA: Insufficient documentation

## 2013-07-26 DIAGNOSIS — Z01810 Encounter for preprocedural cardiovascular examination: Secondary | ICD-10-CM | POA: Insufficient documentation

## 2013-07-26 DIAGNOSIS — Z01818 Encounter for other preprocedural examination: Secondary | ICD-10-CM | POA: Insufficient documentation

## 2013-07-26 DIAGNOSIS — Z01812 Encounter for preprocedural laboratory examination: Secondary | ICD-10-CM | POA: Insufficient documentation

## 2013-07-26 HISTORY — DX: Malignant (primary) neoplasm, unspecified: C80.1

## 2013-07-26 HISTORY — DX: Shortness of breath: R06.02

## 2013-07-26 HISTORY — DX: Headache: R51

## 2013-07-26 HISTORY — DX: Gastro-esophageal reflux disease without esophagitis: K21.9

## 2013-07-26 HISTORY — DX: Pneumonia, unspecified organism: J18.9

## 2013-07-26 LAB — BLOOD GAS, ARTERIAL
Acid-base deficit: 0.7 mmol/L (ref 0.0–2.0)
Bicarbonate: 23 mEq/L (ref 20.0–24.0)
Drawn by: 206361
O2 Saturation: 97.4 %
Patient temperature: 98.6
TCO2: 24 mmol/L (ref 0–100)
pCO2 arterial: 34.3 mmHg — ABNORMAL LOW (ref 35.0–45.0)
pH, Arterial: 7.44 (ref 7.350–7.450)
pO2, Arterial: 85.4 mmHg (ref 80.0–100.0)

## 2013-07-26 LAB — COMPREHENSIVE METABOLIC PANEL
ALT: 12 U/L (ref 0–53)
AST: 16 U/L (ref 0–37)
Albumin: 3.4 g/dL — ABNORMAL LOW (ref 3.5–5.2)
Alkaline Phosphatase: 91 U/L (ref 39–117)
BUN: 11 mg/dL (ref 6–23)
CO2: 20 mEq/L (ref 19–32)
Calcium: 9.5 mg/dL (ref 8.4–10.5)
Chloride: 103 mEq/L (ref 96–112)
Creatinine, Ser: 0.75 mg/dL (ref 0.50–1.35)
GFR calc Af Amer: >90 mL/min (ref >90)
GFR calc non Af Amer: 89 mL/min — ABNORMAL LOW (ref >90)
Glucose, Bld: 110 mg/dL — ABNORMAL HIGH (ref 70–99)
Potassium: 4.1 mEq/L (ref 3.7–5.3)
Sodium: 139 mEq/L (ref 137–147)
Total Bilirubin: 0.5 mg/dL (ref 0.3–1.2)
Total Protein: 7.5 g/dL (ref 6.0–8.3)

## 2013-07-26 LAB — URINALYSIS, ROUTINE W REFLEX MICROSCOPIC
Glucose, UA: NEGATIVE mg/dL
Hgb urine dipstick: NEGATIVE
Ketones, ur: 15 mg/dL — AB
Leukocytes, UA: NEGATIVE
Nitrite: NEGATIVE
Protein, ur: NEGATIVE mg/dL
Specific Gravity, Urine: 1.027 (ref 1.005–1.030)
Urobilinogen, UA: 1 mg/dL (ref 0.0–1.0)
pH: 6 (ref 5.0–8.0)

## 2013-07-26 LAB — CBC
HCT: 37.7 % — ABNORMAL LOW (ref 39.0–52.0)
Hemoglobin: 12.7 g/dL — ABNORMAL LOW (ref 13.0–17.0)
MCH: 32.6 pg (ref 26.0–34.0)
MCHC: 33.7 g/dL (ref 30.0–36.0)
MCV: 96.7 fL (ref 78.0–100.0)
Platelets: 254 10*3/uL (ref 150–400)
RBC: 3.9 MIL/uL — ABNORMAL LOW (ref 4.22–5.81)
RDW: 19.5 % — ABNORMAL HIGH (ref 11.5–15.5)
WBC: 5.4 10*3/uL (ref 4.0–10.5)

## 2013-07-26 LAB — APTT: aPTT: 33 seconds (ref 24–37)

## 2013-07-26 LAB — SURGICAL PCR SCREEN
MRSA, PCR: NEGATIVE
Staphylococcus aureus: POSITIVE — AB

## 2013-07-26 LAB — ABO/RH: ABO/RH(D): O POS

## 2013-07-26 LAB — PROTIME-INR
INR: 1.01 (ref 0.00–1.49)
Prothrombin Time: 13.1 seconds (ref 11.6–15.2)

## 2013-07-26 NOTE — Pre-Procedure Instructions (Signed)
Terry Robles  07/26/2013   Your procedure is scheduled on: Tuesday, Jul 30, 2013 at 7:30 AM  Report to [facility] (use Main Entrance "A'') at 5:30 AM.  Call this number if you have problems the morning of surgery: [phone]   Remember:   Do not eat food or drink liquids after midnight.   Take these medicines the morning of surgery with A SIP OF WATER:cycloSPORINE (RESTASIS) 0.05 If needed:oxyCODONE-acetaminophen (PERCOCET/ROXICET) for pain,  Stop taking Aspirin, Multivitamins and herbal medications. Do not take any NSAIDs ie: Ibuprofen, Advil, Naproxen or any medication containing Aspirin.   Do not wear jewelry, make-up or nail polish.  Do not wear lotions, powders, or perfumes. You may NOT wear deodorant.  Do not shave 48 hours prior to surgery. Men may shave face and neck.  Do not bring valuables to the hospital.  [facility] is not responsible for any belongings or valuables.               Contacts, dentures or bridgework may not be worn into surgery.  Leave suitcase in the car. After surgery it may be brought to your room.  For patients admitted to the hospital, discharge time is determined by your treatment team.               Patients discharged the day of surgery will not be allowed to drive home.  Name and phone number of your driver:   Special Instructions:  Special Instructions:Special Instructions: [facility] - Preparing for Surgery  Before surgery, you can play an important role.  Because skin is not sterile, your skin needs to be as free of germs as possible.  You can reduce the number of germs on you skin by washing with CHG (chlorahexidine gluconate) soap before surgery.  CHG is an antiseptic cleaner which kills germs and bonds with the skin to continue killing germs even after washing.  Please DO NOT use if you have an allergy to CHG or antibacterial soaps.  If your skin becomes reddened/irritated stop using the CHG and inform your nurse when you arrive at  Short Stay.  Do not shave (including legs and underarms) for at least 48 hours prior to the first CHG shower.  You may shave your face.  Please follow these instructions carefully:   1.  Shower with CHG Soap the night before surgery and the morning of Surgery.  2.  If you choose to wash your hair, wash your hair first as usual with your normal shampoo.  3.  After you shampoo, rinse your hair and body thoroughly to remove the Shampoo.  4.  Use CHG as you would any other liquid soap.  You can apply chg directly  to the skin and wash gently with scrungie or a clean washcloth.  5.  Apply the CHG Soap to your body ONLY FROM THE NECK DOWN.  Do not use on open wounds or open sores.  Avoid contact with your eyes, ears, mouth and genitals (private parts).  Wash genitals (private parts) with your normal soap.  6.  Wash thoroughly, paying special attention to the area where your surgery will be performed.  7.  Thoroughly rinse your body with warm water from the neck down.  8.  DO NOT shower/wash with your normal soap after using and rinsing off the CHG Soap.  9.  Pat yourself dry with a clean towel.            10.  Wear  clean pajamas.            11.  Place clean sheets on your bed the night of your first shower and do not sleep with pets.  Day of Surgery  Do not apply any lotions/deodorants the morning of surgery.  Please wear clean clothes to the hospital/surgery center.   Please read over the following fact sheets that you were given: Pain Booklet, Coughing and Deep Breathing, Blood Transfusion Information, MRSA Information and Surgical Site Infection Prevention

## 2013-07-28 ENCOUNTER — Encounter: Payer: Self-pay | Admitting: Internal Medicine

## 2013-07-29 ENCOUNTER — Ambulatory Visit (HOSPITAL_BASED_OUTPATIENT_CLINIC_OR_DEPARTMENT_OTHER): Payer: Medicare Other | Admitting: Internal Medicine

## 2013-07-29 ENCOUNTER — Encounter: Payer: Self-pay | Admitting: Internal Medicine

## 2013-07-29 VITALS — BP 123/65 | HR 77 | Temp 99.0°F | Resp 18 | Ht 73.0 in | Wt 211.3 lb

## 2013-07-29 DIAGNOSIS — C3410 Malignant neoplasm of upper lobe, unspecified bronchus or lung: Secondary | ICD-10-CM

## 2013-07-29 MED ORDER — DEXTROSE 5 % IV SOLN
1.5000 g | INTRAVENOUS | Status: AC
  Administered 2013-07-30: 1.5 g via INTRAVENOUS
  Filled 2013-07-29: qty 1.5

## 2013-07-29 NOTE — Telephone Encounter (Signed)
Note read at end of day.  Patient was seen today.

## 2013-07-29 NOTE — Progress Notes (Signed)
[facility] office called and requested last office visit, stress test, echo, and any other heart test. Office staff states they will send it over.

## 2013-07-29 NOTE — Progress Notes (Signed)
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   INTERVAL HISTORY: KELEN LAURA 73 y.o. male returns for followup visit. The patient is feeling fine today with no specific complaints except for mild fatigue and mild odynophagia from radiation induced esophagitis secondary to his recent course of concurrent chemoradiation. The symptoms are improving. He denied having any significant fever or chills. He has no nausea or vomiting. The patient denied having any significant chest pain, shortness of breath, cough or hemoptysis. He completed 5 weeks of weekly chemotherapy concurrent with radiation.  He had repeat CT scan of the chest performed recently and he is here for evaluation and discussion of his scan results.   MEDICAL  HISTORY: Past Medical History  Diagnosis Date  . Hypercholesteremia 05/14/2011  . BPH (benign prostatic hyperplasia) 05/14/2011  . BPH (benign prostatic hyperplasia)   . Shortness of breath     with exertion  . Wears glasses   . Pneumonia   . GERD (gastroesophageal reflux disease)   . Headache(784.0)   . Cancer     skin cancer squamous on left arm    ALLERGIES:  has No Known Allergies.  MEDICATIONS:  Current Outpatient Prescriptions  Medication Sig Dispense Refill  . cycloSPORINE (RESTASIS) 0.05 % ophthalmic emulsion 1 drop 2 (two) times daily.      Marland Kitchen docusate sodium (COLACE) 100 MG capsule Take 100 mg by mouth daily.      Marland Kitchen oxyCODONE-acetaminophen (PERCOCET/ROXICET) 5-325 MG per tablet Take 1 tablet by mouth every 6 (six) hours as needed for severe pain.  60 tablet  0  . Tamsulosin HCl (FLOMAX) 0.4 MG CAPS Take 0.4 mg by mouth daily after supper.      . Multiple Vitamin (MULITIVITAMIN WITH MINERALS) TABS Take 1 tablet by mouth daily.       No current facility-administered medications for this visit.   Facility-Administered Medications Ordered in Other Visits  Medication Dose Route Frequency Provider Last Rate Last Dose  . cefUROXime (ZINACEF) 1.5 g in dextrose 5 % 50 mL IVPB  1.5 g Intravenous 60 min Pre-Op Grace Isaac, MD        SURGICAL HISTORY:  Past Surgical History  Procedure Laterality Date  . Tonsillectomy    . Other surgical history      benign tumor  removed from left leg several years ago  . Other surgical history      had melanoma/squamous cell removed previously per patient  . Tumor excision      Left knee  . Colonoscopy w/ biopsies and polypectomy      REVIEW OF SYSTEMS:  Constitutional: positive for fatigue Eyes: negative Ears, nose, mouth, throat, and face: negative Respiratory: positive for dyspnea on exertion Cardiovascular: negative Gastrointestinal: positive for constipation, diarrhea, nausea and  vomiting Genitourinary:negative Integument/breast: negative Hematologic/lymphatic: negative Musculoskeletal:negative Neurological: negative Behavioral/Psych: negative Endocrine: negative Allergic/Immunologic: negative   PHYSICAL EXAMINATION: General appearance: alert, cooperative, appears stated age and no distress Head: Normocephalic, without obvious abnormality, atraumatic Neck: no adenopathy, no carotid bruit, no JVD, supple, symmetrical, trachea midline and thyroid not enlarged, symmetric, no tenderness/mass/nodules Lymph nodes: Cervical, supraclavicular, and axillary nodes normal. Resp: clear to auscultation bilaterally Back: symmetric, no curvature. ROM normal. No CVA tenderness. Cardio: regular rate and rhythm, S1, S2 normal, no murmur, click, rub or gallop GI: soft, non-tender; bowel sounds normal; no masses,  no organomegaly Extremities: extremities normal, atraumatic, no cyanosis or edema Neurologic: Alert and oriented X 3, normal strength and tone. Normal symmetric reflexes. Normal coordination and gait  ECOG PERFORMANCE STATUS: 1 - Symptomatic but completely ambulatory  Blood pressure 123/65, pulse 77, temperature 99 F (37.2 C), temperature source Oral, resp. rate 18, height 6\' 1"  (1.854 m), weight 211 lb 4.8 oz (95.845 kg), SpO2 100.00%.  LABORATORY DATA: Lab Results  Component Value Date   WBC 5.4 07/26/2013   HGB 12.7* 07/26/2013   HCT 37.7* 07/26/2013   MCV 96.7 07/26/2013   PLT 254 07/26/2013      Chemistry      Component Value Date/Time   NA 139 07/26/2013 1403   NA 141 07/24/2013 0955   K 4.1 07/26/2013 1403   K 4.5 07/24/2013 0955   CL 103 07/26/2013 1403   CO2 20 07/26/2013 1403   CO2 24 07/24/2013 0955   BUN 11 07/26/2013 1403   BUN 12.9 07/24/2013 0955   CREATININE 0.75 07/26/2013 1403   CREATININE 1.0 07/24/2013 0955      Component Value Date/Time   CALCIUM 9.5 07/26/2013 1403   CALCIUM 9.8 07/24/2013 0955   ALKPHOS 91 07/26/2013 1403   ALKPHOS 84 07/24/2013 0955   AST  16 07/26/2013 1403   AST 14 07/24/2013 0955   ALT 12 07/26/2013 1403   ALT 9 07/24/2013 0955   BILITOT 0.5 07/26/2013 1403   BILITOT 0.51 07/24/2013 0955       RADIOGRAPHIC STUDIES: Chest 2 View  07/26/2013   CLINICAL DATA:  Preoperative study. Right apical mass. History of radiation therapy.  EXAM: CHEST  2 VIEW  COMPARISON:  CT chest 07/25/2011. Single view of the chest 04/30/2013.  FINDINGS: Right apical mass lesion is again seen. On today's study, it measures 6.1 cm transverse by 7.0 cm craniocaudal compared to 7.0 cm transverse by 9.7 cm craniocaudal. Destructive change in the right second and third ribs and 7 is again seen, unchanged. A 0.6 cm nodular opacity projecting in the lingula is also unchanged. The lungs are emphysematous. Heart size is normal.  IMPRESSION: Decrease in the size of a right apical mass.  No new abnormality.   Electronically Signed   By: Inge Rise M.D.   On: 07/26/2013 14:31   Ct Chest W Contrast  07/24/2013   CLINICAL DATA:  Lung cancer restaging, chemotherapy ongoing  EXAM: CT CHEST WITH CONTRAST  TECHNIQUE: Multidetector CT imaging  of the chest was performed during intravenous contrast administration.  CONTRAST:  9mL OMNIPAQUE IOHEXOL 300 MG/ML  SOLN  COMPARISON:  PET-CT dated 04/25/2013.  CT chest dated 04/12/2013.  FINDINGS: 5.9 x 7.0 x 6.7 cm posterior right upper lobe pulmonary mass (series 6/ image 10), previously 6.7 x 8.5 x 8.7 cm on prior CT. Mass invades the chest wall at the posterior right lung apex (series 3/ image 6). Mass invades the right posterior 2nd and 3rd ribs (series 3/image 5).  Mild patchy/ground-glass opacities in the superior segment right lower lobe (series 6/images 21-22), new, possibly infectious/ inflammatory. No suspicious pulmonary nodules. Moderate centrilobular and paraseptal emphysematous changes.  No pleural effusion or pneumothorax.  Visualized thyroid is unremarkable.  The heart is normal in size. No pericardial effusion. Coronary  atherosclerosis. Atherosclerotic calcifications of the aortic arch.  6 mm short axis right paratracheal node (series 3/image 16), previously 8 mm. 7 mm short axis right hilar node (series 3/ image 21), previously 16 mm. 5 mm short axis right infrahilar/ interlobar node (series 3/ image 29), previously 7 mm.  Visualized upper abdomen is notable for layering gallstones (series 3/ image 61).  Stable 5.3 x 2.0 cm subcutaneous soft tissue lesion in the left anterior chest wall (series 3/ image 43), non FDG avid.  Degenerative changes of the visualized thoracolumbar spine.  IMPRESSION: 7.0 cm posterior right upper lobe pulmonary mass, corresponding to known primary bronchogenic neoplasm.  Stable invasion into the right upper chest wall/ribs.  Associated mild thoracic lymphadenopathy, decreased.   Electronically Signed   By: Julian Hy M.D.   On: 07/24/2013 14:01   ASSESSMENT/PLAN:  This is a very pleasant 73 years old white male recently diagnosed with a stage IIIA non-small cell lung cancer. He is currently undergoing concurrent chemoradiation with weekly carboplatin and paclitaxel is status post 5 cycle. He tolerated his treatment fairly well except mild fatigue.  His recent CT scan of the chest showed partial response with decrease in the size of the posterior right upper lobe pulmonary mass. The patient was seen recently by Dr. Servando Snare and expected to have mediastinoscopy tomorrow followed by surgical resection if there is no evidence for residual disease in the mediastinal lymph nodes. If the patient has complete surgical resection I would see him back for followup visit in 3 months for reevaluation with repeat CT scan of the chest, but if he is not a surgical candidate, I would consider him for her consolidation chemotherapy. I did not give the patient a followup appointment at this point awaiting to hear from him after the surgical procedure. He was advised to call immediately if he has any  concerning symptoms in the interval.  Disclaimer: This note was dictated with voice recognition software. Similar sounding words can inadvertently be transcribed and may not be corrected upon review.

## 2013-07-30 ENCOUNTER — Encounter (HOSPITAL_COMMUNITY): Payer: Self-pay | Admitting: *Deleted

## 2013-07-30 ENCOUNTER — Inpatient Hospital Stay (HOSPITAL_COMMUNITY)
Admission: RE | Admit: 2013-07-30 | Discharge: 2013-08-06 | DRG: 165 | Disposition: A | Discharge status: Home / Self Care | Payer: Medicare Other | Source: Ambulatory Visit | Attending: Cardiothoracic Surgery | Admitting: Cardiothoracic Surgery

## 2013-07-30 ENCOUNTER — Inpatient Hospital Stay (HOSPITAL_COMMUNITY): Payer: Medicare Other | Admitting: Certified Registered"

## 2013-07-30 ENCOUNTER — Encounter (HOSPITAL_COMMUNITY): Payer: Medicare Other | Admitting: Vascular Surgery

## 2013-07-30 ENCOUNTER — Inpatient Hospital Stay (HOSPITAL_COMMUNITY): Payer: Medicare Other

## 2013-07-30 ENCOUNTER — Encounter (HOSPITAL_COMMUNITY)
Admission: RE | Disposition: A | Discharge status: Home / Self Care | Payer: Self-pay | Source: Ambulatory Visit | Attending: Cardiothoracic Surgery

## 2013-07-30 DIAGNOSIS — N400 Enlarged prostate without lower urinary tract symptoms: Secondary | ICD-10-CM | POA: Diagnosis present

## 2013-07-30 DIAGNOSIS — Z923 Personal history of irradiation: Secondary | ICD-10-CM

## 2013-07-30 DIAGNOSIS — R911 Solitary pulmonary nodule: Secondary | ICD-10-CM

## 2013-07-30 DIAGNOSIS — C3410 Malignant neoplasm of upper lobe, unspecified bronchus or lung: Principal | ICD-10-CM | POA: Diagnosis present

## 2013-07-30 DIAGNOSIS — K5900 Constipation, unspecified: Secondary | ICD-10-CM | POA: Diagnosis not present

## 2013-07-30 DIAGNOSIS — Z823 Family history of stroke: Secondary | ICD-10-CM

## 2013-07-30 DIAGNOSIS — E7800 Pure hypercholesterolemia, unspecified: Secondary | ICD-10-CM | POA: Diagnosis present

## 2013-07-30 DIAGNOSIS — Z800 Family history of malignant neoplasm of digestive organs: Secondary | ICD-10-CM

## 2013-07-30 DIAGNOSIS — Z808 Family history of malignant neoplasm of other organs or systems: Secondary | ICD-10-CM

## 2013-07-30 DIAGNOSIS — C3412 Malignant neoplasm of upper lobe, left bronchus or lung: Secondary | ICD-10-CM | POA: Diagnosis present

## 2013-07-30 DIAGNOSIS — Z87891 Personal history of nicotine dependence: Secondary | ICD-10-CM

## 2013-07-30 DIAGNOSIS — K219 Gastro-esophageal reflux disease without esophagitis: Secondary | ICD-10-CM | POA: Diagnosis present

## 2013-07-30 DIAGNOSIS — Z9221 Personal history of antineoplastic chemotherapy: Secondary | ICD-10-CM

## 2013-07-30 DIAGNOSIS — Z85820 Personal history of malignant melanoma of skin: Secondary | ICD-10-CM

## 2013-07-30 HISTORY — PX: THORACOTOMY: SHX5074

## 2013-07-30 HISTORY — PX: MEDIASTINOSCOPY: SHX5086

## 2013-07-30 HISTORY — PX: VIDEO ASSISTED THORACOSCOPY (VATS)/WEDGE RESECTION: SHX6174

## 2013-07-30 HISTORY — PX: VIDEO BRONCHOSCOPY: SHX5072

## 2013-07-30 LAB — GLUCOSE, CAPILLARY: Glucose-Capillary: 137 mg/dL — ABNORMAL HIGH (ref 70–99)

## 2013-07-30 LAB — PREPARE RBC (CROSSMATCH)

## 2013-07-30 SURGERY — BRONCHOSCOPY, VIDEO-ASSISTED
Anesthesia: General | Site: Chest | Laterality: Right

## 2013-07-30 MED ORDER — HYDROMORPHONE HCL PF 1 MG/ML IJ SOLN
INTRAMUSCULAR | Status: AC
  Filled 2013-07-30: qty 1

## 2013-07-30 MED ORDER — LACTATED RINGERS IV SOLN
INTRAVENOUS | Status: DC | PRN
  Administered 2013-07-30 (×3): via INTRAVENOUS

## 2013-07-30 MED ORDER — PHENYLEPHRINE HCL 10 MG/ML IJ SOLN
INTRAMUSCULAR | Status: AC
  Filled 2013-07-30: qty 1

## 2013-07-30 MED ORDER — DIPHENHYDRAMINE HCL 12.5 MG/5ML PO ELIX
12.5000 mg | ORAL_SOLUTION | Freq: Four times a day (QID) | ORAL | Status: DC | PRN
  Filled 2013-07-30: qty 5

## 2013-07-30 MED ORDER — OXYCODONE HCL 5 MG PO TABS
5.0000 mg | ORAL_TABLET | ORAL | Status: DC | PRN
  Administered 2013-07-30 – 2013-08-03 (×11): 10 mg via ORAL
  Administered 2013-08-03: 5 mg via ORAL
  Administered 2013-08-03 – 2013-08-04 (×3): 10 mg via ORAL
  Administered 2013-08-04: 5 mg via ORAL
  Administered 2013-08-05 – 2013-08-06 (×6): 10 mg via ORAL
  Filled 2013-07-30 (×23): qty 2

## 2013-07-30 MED ORDER — ONDANSETRON HCL 4 MG/2ML IJ SOLN
4.0000 mg | Freq: Four times a day (QID) | INTRAMUSCULAR | Status: DC | PRN
  Filled 2013-07-30: qty 2

## 2013-07-30 MED ORDER — ONDANSETRON HCL 4 MG/2ML IJ SOLN
INTRAMUSCULAR | Status: AC
  Filled 2013-07-30: qty 2

## 2013-07-30 MED ORDER — MIDAZOLAM HCL 5 MG/5ML IJ SOLN
INTRAMUSCULAR | Status: DC | PRN
  Administered 2013-07-30 (×2): 1 mg via INTRAVENOUS

## 2013-07-30 MED ORDER — SODIUM CHLORIDE 0.9 % IJ SOLN
INTRAMUSCULAR | Status: AC
  Filled 2013-07-30: qty 10

## 2013-07-30 MED ORDER — MIDAZOLAM HCL 2 MG/2ML IJ SOLN
INTRAMUSCULAR | Status: AC
  Filled 2013-07-30: qty 2

## 2013-07-30 MED ORDER — GLYCOPYRROLATE 0.2 MG/ML IJ SOLN
INTRAMUSCULAR | Status: AC
  Filled 2013-07-30: qty 4

## 2013-07-30 MED ORDER — ONDANSETRON HCL 4 MG/2ML IJ SOLN
INTRAMUSCULAR | Status: DC | PRN
  Administered 2013-07-30: 4 mg via INTRAVENOUS

## 2013-07-30 MED ORDER — SUFENTANIL CITRATE 50 MCG/ML IV SOLN
INTRAVENOUS | Status: AC
  Filled 2013-07-30: qty 1

## 2013-07-30 MED ORDER — DIPHENHYDRAMINE HCL 50 MG/ML IJ SOLN
12.5000 mg | Freq: Four times a day (QID) | INTRAMUSCULAR | Status: DC | PRN
  Filled 2013-07-30: qty 0.25

## 2013-07-30 MED ORDER — ACETAMINOPHEN 325 MG PO TABS
325.0000 mg | ORAL_TABLET | ORAL | Status: DC | PRN
Start: 2013-07-30 — End: 2013-07-30

## 2013-07-30 MED ORDER — HYDROMORPHONE HCL PF 1 MG/ML IJ SOLN
0.2500 mg | INTRAMUSCULAR | Status: DC | PRN
  Administered 2013-07-30 (×2): 0.5 mg via INTRAVENOUS

## 2013-07-30 MED ORDER — POTASSIUM CHLORIDE 10 MEQ/50ML IV SOLN: 10.0000 meq | Freq: Every day | INTRAVENOUS | Status: DC | PRN

## 2013-07-30 MED ORDER — VECURONIUM BROMIDE 10 MG IV SOLR
INTRAVENOUS | Status: AC
  Filled 2013-07-30: qty 10

## 2013-07-30 MED ORDER — KCL IN DEXTROSE-NACL 20-5-0.9 MEQ/L-%-% IV SOLN
INTRAVENOUS | Status: DC
  Administered 2013-07-30 – 2013-07-31 (×3): via INTRAVENOUS
  Filled 2013-07-30 (×6): qty 1000

## 2013-07-30 MED ORDER — ONDANSETRON HCL 4 MG/2ML IJ SOLN: 4.0000 mg | Freq: Once | INTRAMUSCULAR | Status: DC | PRN

## 2013-07-30 MED ORDER — VECURONIUM BROMIDE 10 MG IV SOLR
INTRAVENOUS | Status: DC | PRN
  Administered 2013-07-30: 1 mg via INTRAVENOUS
  Administered 2013-07-30: 2 mg via INTRAVENOUS
  Administered 2013-07-30 (×4): 1 mg via INTRAVENOUS
  Administered 2013-07-30: 2 mg via INTRAVENOUS
  Administered 2013-07-30 (×3): 1 mg via INTRAVENOUS
  Administered 2013-07-30: 2 mg via INTRAVENOUS

## 2013-07-30 MED ORDER — NEOSTIGMINE METHYLSULFATE 10 MG/10ML IV SOLN
INTRAVENOUS | Status: AC
  Filled 2013-07-30: qty 1

## 2013-07-30 MED ORDER — OXYCODONE HCL 5 MG PO TABS: 5.0000 mg | ORAL_TABLET | Freq: Once | ORAL | Status: DC | PRN

## 2013-07-30 MED ORDER — GLYCOPYRROLATE 0.2 MG/ML IJ SOLN
INTRAMUSCULAR | Status: DC | PRN
  Administered 2013-07-30: 0.6 mg via INTRAVENOUS

## 2013-07-30 MED ORDER — 0.9 % SODIUM CHLORIDE (POUR BTL) OPTIME
TOPICAL | Status: DC | PRN
  Administered 2013-07-30: 1000 mL

## 2013-07-30 MED ORDER — KETOROLAC TROMETHAMINE 30 MG/ML IJ SOLN
INTRAMUSCULAR | Status: AC
  Filled 2013-07-30: qty 1

## 2013-07-30 MED ORDER — ROCURONIUM BROMIDE 100 MG/10ML IV SOLN
INTRAVENOUS | Status: DC | PRN
  Administered 2013-07-30: 50 mg via INTRAVENOUS

## 2013-07-30 MED ORDER — ACETAMINOPHEN 160 MG/5ML PO SOLN
325.0000 mg | ORAL | Status: DC | PRN
  Filled 2013-07-30: qty 20.3

## 2013-07-30 MED ORDER — ALBUTEROL SULFATE HFA 108 (90 BASE) MCG/ACT IN AERS
INHALATION_SPRAY | RESPIRATORY_TRACT | Status: DC | PRN
  Administered 2013-07-30: 2 via RESPIRATORY_TRACT

## 2013-07-30 MED ORDER — DEXTROSE 5 % IV SOLN
1.5000 g | Freq: Two times a day (BID) | INTRAVENOUS | Status: AC
  Administered 2013-07-30 – 2013-07-31 (×2): 1.5 g via INTRAVENOUS
  Filled 2013-07-30 (×2): qty 1.5

## 2013-07-30 MED ORDER — ROCURONIUM BROMIDE 50 MG/5ML IV SOLN
INTRAVENOUS | Status: AC
  Filled 2013-07-30: qty 1

## 2013-07-30 MED ORDER — PHENYLEPHRINE HCL 10 MG/ML IJ SOLN
INTRAMUSCULAR | Status: DC | PRN
  Administered 2013-07-30 (×2): 80 ug via INTRAVENOUS

## 2013-07-30 MED ORDER — TAMSULOSIN HCL 0.4 MG PO CAPS
0.4000 mg | ORAL_CAPSULE | Freq: Every day | ORAL | Status: DC
  Administered 2013-07-31 – 2013-08-05 (×6): 0.4 mg via ORAL
  Filled 2013-07-30 (×8): qty 1

## 2013-07-30 MED ORDER — HYDROMORPHONE HCL PF 1 MG/ML IJ SOLN
INTRAMUSCULAR | Status: DC | PRN
  Administered 2013-07-30 (×3): .25 mg via INTRAVENOUS

## 2013-07-30 MED ORDER — ALBUMIN HUMAN 5 % IV SOLN
INTRAVENOUS | Status: DC | PRN
  Administered 2013-07-30 (×2): via INTRAVENOUS

## 2013-07-30 MED ORDER — ACETAMINOPHEN 160 MG/5ML PO SOLN
1000.0000 mg | Freq: Four times a day (QID) | ORAL | Status: AC
  Filled 2013-07-30: qty 40

## 2013-07-30 MED ORDER — SODIUM CHLORIDE 0.9 % IJ SOLN: 9.0000 mL | INTRAMUSCULAR | Status: DC | PRN

## 2013-07-30 MED ORDER — MUPIROCIN CALCIUM 2 % EX CREA
TOPICAL_CREAM | Freq: Two times a day (BID) | CUTANEOUS | Status: DC
  Administered 2013-07-30 – 2013-07-31 (×2): 1 via TOPICAL
  Administered 2013-07-31 – 2013-08-04 (×6): via TOPICAL
  Filled 2013-07-30: qty 15

## 2013-07-30 MED ORDER — LIDOCAINE HCL (CARDIAC) 20 MG/ML IV SOLN
INTRAVENOUS | Status: AC
  Filled 2013-07-30: qty 5

## 2013-07-30 MED ORDER — SUFENTANIL CITRATE 50 MCG/ML IV SOLN
INTRAVENOUS | Status: DC | PRN
  Administered 2013-07-30: 5 ug via INTRAVENOUS
  Administered 2013-07-30 (×2): 10 ug via INTRAVENOUS
  Administered 2013-07-30: 15 ug via INTRAVENOUS
  Administered 2013-07-30 (×4): 10 ug via INTRAVENOUS
  Administered 2013-07-30: 5 ug via INTRAVENOUS
  Administered 2013-07-30 (×2): 10 ug via INTRAVENOUS

## 2013-07-30 MED ORDER — LEVALBUTEROL HCL 0.63 MG/3ML IN NEBU
0.6300 mg | INHALATION_SOLUTION | Freq: Three times a day (TID) | RESPIRATORY_TRACT | Status: DC
  Administered 2013-07-30 – 2013-08-02 (×7): 0.63 mg via RESPIRATORY_TRACT
  Filled 2013-07-30 (×18): qty 3

## 2013-07-30 MED ORDER — PROPOFOL 10 MG/ML IV BOLUS
INTRAVENOUS | Status: AC
  Filled 2013-07-30: qty 20

## 2013-07-30 MED ORDER — BISACODYL 5 MG PO TBEC
10.0000 mg | DELAYED_RELEASE_TABLET | Freq: Every day | ORAL | Status: DC
  Administered 2013-07-31 – 2013-08-04 (×3): 10 mg via ORAL
  Filled 2013-07-30 (×4): qty 2

## 2013-07-30 MED ORDER — KETOROLAC TROMETHAMINE 30 MG/ML IJ SOLN
15.0000 mg | Freq: Once | INTRAMUSCULAR | Status: AC | PRN
  Administered 2013-07-30: 30 mg via INTRAVENOUS

## 2013-07-30 MED ORDER — BUPIVACAINE 0.25 % ON-Q PUMP SINGLE CATH 300ML
300.0000 mL | INJECTION | Status: DC
  Filled 2013-07-30: qty 300

## 2013-07-30 MED ORDER — PANTOPRAZOLE SODIUM 40 MG PO TBEC
40.0000 mg | DELAYED_RELEASE_TABLET | Freq: Every day | ORAL | Status: DC
  Administered 2013-07-31 – 2013-08-06 (×7): 40 mg via ORAL
  Filled 2013-07-30 (×7): qty 1

## 2013-07-30 MED ORDER — NALOXONE HCL 0.4 MG/ML IJ SOLN
0.4000 mg | INTRAMUSCULAR | Status: DC | PRN
  Filled 2013-07-30: qty 1

## 2013-07-30 MED ORDER — SODIUM CHLORIDE 0.9 % IV SOLN
INTRAVENOUS | Status: DC | PRN
  Administered 2013-07-30: 13:00:00 via INTRAVENOUS

## 2013-07-30 MED ORDER — PROPOFOL 10 MG/ML IV BOLUS
INTRAVENOUS | Status: DC | PRN
  Administered 2013-07-30: 130 mg via INTRAVENOUS

## 2013-07-30 MED ORDER — ONDANSETRON HCL 4 MG/2ML IJ SOLN
4.0000 mg | Freq: Four times a day (QID) | INTRAMUSCULAR | Status: DC | PRN
  Administered 2013-08-04 – 2013-08-05 (×2): 4 mg via INTRAVENOUS
  Filled 2013-07-30 (×2): qty 2

## 2013-07-30 MED ORDER — SENNOSIDES-DOCUSATE SODIUM 8.6-50 MG PO TABS
1.0000 | ORAL_TABLET | Freq: Every day | ORAL | Status: DC
  Administered 2013-07-30 – 2013-08-05 (×6): 1 via ORAL
  Filled 2013-07-30 (×8): qty 1

## 2013-07-30 MED ORDER — SUCCINYLCHOLINE CHLORIDE 20 MG/ML IJ SOLN
INTRAMUSCULAR | Status: AC
  Filled 2013-07-30: qty 1

## 2013-07-30 MED ORDER — PHENYLEPHRINE HCL 10 MG/ML IJ SOLN
10.0000 mg | INTRAMUSCULAR | Status: DC | PRN
  Administered 2013-07-30: 15:00:00 via INTRAVENOUS
  Administered 2013-07-30: 40 ug/min via INTRAVENOUS
  Administered 2013-07-30: 80 ug/min via INTRAVENOUS

## 2013-07-30 MED ORDER — LIDOCAINE HCL (CARDIAC) 20 MG/ML IV SOLN
INTRAVENOUS | Status: DC | PRN
  Administered 2013-07-30: 60 mg via INTRAVENOUS

## 2013-07-30 MED ORDER — LACTATED RINGERS IV SOLN
INTRAVENOUS | Status: DC | PRN
  Administered 2013-07-30 (×2): via INTRAVENOUS

## 2013-07-30 MED ORDER — TRAMADOL HCL 50 MG PO TABS
50.0000 mg | ORAL_TABLET | Freq: Four times a day (QID) | ORAL | Status: DC | PRN
  Administered 2013-07-31 – 2013-08-06 (×7): 100 mg via ORAL
  Filled 2013-07-30 (×7): qty 2

## 2013-07-30 MED ORDER — FENTANYL 10 MCG/ML IV SOLN
INTRAVENOUS | Status: DC
  Administered 2013-07-30: 120 ug via INTRAVENOUS
  Administered 2013-07-30: 18:00:00 via INTRAVENOUS
  Administered 2013-07-31: 140 ug via INTRAVENOUS
  Administered 2013-07-31: 130 ug via INTRAVENOUS
  Administered 2013-07-31: 80 ug via INTRAVENOUS
  Filled 2013-07-30 (×3): qty 50

## 2013-07-30 MED ORDER — ADULT MULTIVITAMIN W/MINERALS CH
1.0000 | ORAL_TABLET | Freq: Every day | ORAL | Status: DC
  Administered 2013-07-31 – 2013-08-06 (×7): 1 via ORAL
  Filled 2013-07-30 (×7): qty 1

## 2013-07-30 MED ORDER — INSULIN ASPART 100 UNIT/ML ~~LOC~~ SOLN
0.0000 [IU] | Freq: Four times a day (QID) | SUBCUTANEOUS | Status: DC
  Administered 2013-07-30 – 2013-07-31 (×2): 2 [IU] via SUBCUTANEOUS

## 2013-07-30 MED ORDER — ACETAMINOPHEN 500 MG PO TABS
1000.0000 mg | ORAL_TABLET | Freq: Four times a day (QID) | ORAL | Status: AC
  Administered 2013-07-31 – 2013-08-04 (×15): 1000 mg via ORAL
  Filled 2013-07-30 (×19): qty 2

## 2013-07-30 MED ORDER — HEMOSTATIC AGENTS (NO CHARGE) OPTIME
TOPICAL | Status: DC | PRN
  Administered 2013-07-30: 1 via TOPICAL

## 2013-07-30 MED ORDER — NEOSTIGMINE METHYLSULFATE 10 MG/10ML IV SOLN
INTRAVENOUS | Status: DC | PRN
  Administered 2013-07-30: 5 mg via INTRAVENOUS

## 2013-07-30 MED ORDER — CYCLOSPORINE 0.05 % OP EMUL
1.0000 [drp] | Freq: Two times a day (BID) | OPHTHALMIC | Status: DC
  Administered 2013-07-30 – 2013-08-06 (×13): 1 [drp] via OPHTHALMIC
  Filled 2013-07-30 (×15): qty 1

## 2013-07-30 MED ORDER — OXYCODONE HCL 5 MG PO TABS
ORAL_TABLET | ORAL | Status: AC
  Filled 2013-07-30: qty 1

## 2013-07-30 MED ORDER — OXYCODONE HCL 5 MG/5ML PO SOLN: 5.0000 mg | Freq: Once | ORAL | Status: DC | PRN

## 2013-07-30 SURGICAL SUPPLY — 111 items
APPLICATOR TIP COSEAL (VASCULAR PRODUCTS) IMPLANT
APPLICATOR TIP EXT COSEAL (VASCULAR PRODUCTS) IMPLANT
BLADE 10 SAFETY STRL DISP (BLADE) IMPLANT
BLADE SURG 10 STRL SS (BLADE) ×4 IMPLANT
BLADE SURG 11 STRL SS (BLADE) ×4 IMPLANT
BRUSH CYTOL CELLEBRITY 1.5X140 (MISCELLANEOUS) IMPLANT
CANISTER SUCTION 2500CC (MISCELLANEOUS) ×4 IMPLANT
CATH KIT ON Q 5IN SLV (PAIN MANAGEMENT) ×4 IMPLANT
CATH THORACIC 28FR (CATHETERS) ×4 IMPLANT
CATH THORACIC 36FR (CATHETERS) IMPLANT
CATH THORACIC 36FR RT ANG (CATHETERS) IMPLANT
CLIP TI LARGE 6 (CLIP) ×4 IMPLANT
CLIP TI MEDIUM 6 (CLIP) ×8 IMPLANT
CONN ST 1/4X3/8  BEN (MISCELLANEOUS) ×1
CONN ST 1/4X3/8 BEN (MISCELLANEOUS) ×3 IMPLANT
CONN Y 3/8X3/8X3/8  BEN (MISCELLANEOUS) ×1
CONN Y 3/8X3/8X3/8 BEN (MISCELLANEOUS) ×3 IMPLANT
CONT SPEC 4OZ CLIKSEAL STRL BL (MISCELLANEOUS) ×24 IMPLANT
COUNTER NEEDLE 20 DBL MAG RED (NEEDLE) ×4 IMPLANT
COVER SURGICAL LIGHT HANDLE (MISCELLANEOUS) ×8 IMPLANT
COVER TABLE BACK 60X90 (DRAPES) ×4 IMPLANT
DERMABOND ADVANCED (GAUZE/BANDAGES/DRESSINGS) ×1
DERMABOND ADVANCED .7 DNX12 (GAUZE/BANDAGES/DRESSINGS) ×3 IMPLANT
DRAIN CHANNEL 28F RND 3/8 FF (WOUND CARE) IMPLANT
DRAIN CHANNEL 32F RND 10.7 FF (WOUND CARE) ×4 IMPLANT
DRAPE LAPAROSCOPIC ABDOMINAL (DRAPES) ×4 IMPLANT
DRAPE LAPAROTOMY T 102X78X121 (DRAPES) ×4 IMPLANT
DRAPE WARM FLUID 44X44 (DRAPE) IMPLANT
DRILL BIT 7/64X5 (BIT) ×4 IMPLANT
DRSG AQUACEL AG ADV 3.5X14 (GAUZE/BANDAGES/DRESSINGS) ×4 IMPLANT
ELECT BLADE 4.0 EZ CLEAN MEGAD (MISCELLANEOUS) ×4
ELECT CAUTERY BLADE 6.4 (BLADE) ×4 IMPLANT
ELECT REM PT RETURN 9FT ADLT (ELECTROSURGICAL) ×4
ELECTRODE BLDE 4.0 EZ CLN MEGD (MISCELLANEOUS) ×3 IMPLANT
ELECTRODE REM PT RTRN 9FT ADLT (ELECTROSURGICAL) ×3 IMPLANT
FORCEPS BIOP RJ4 1.8 (CUTTING FORCEPS) ×4 IMPLANT
GAUZE SPONGE 4X4 16PLY XRAY LF (GAUZE/BANDAGES/DRESSINGS) IMPLANT
GLOVE BIO SURGEON STRL SZ 6.5 (GLOVE) ×24 IMPLANT
GOWN STRL REUS W/ TWL LRG LVL3 (GOWN DISPOSABLE) ×21 IMPLANT
GOWN STRL REUS W/TWL LRG LVL3 (GOWN DISPOSABLE) ×7
HANDLE STAPLE ENDO GIA SHORT (STAPLE) ×1
HEMOSTAT SURGICEL 2X14 (HEMOSTASIS) ×4 IMPLANT
KIT BASIN OR (CUSTOM PROCEDURE TRAY) ×4 IMPLANT
KIT ROOM TURNOVER OR (KITS) ×4 IMPLANT
KIT SUCTION CATH 14FR (SUCTIONS) ×4 IMPLANT
MARKER SKIN DUAL TIP RULER LAB (MISCELLANEOUS) ×8 IMPLANT
NEEDLE BIOPSY TRANSBRONCH 21G (NEEDLE) IMPLANT
NS IRRIG 1000ML POUR BTL (IV SOLUTION) ×12 IMPLANT
OIL SILICONE PENTAX (PARTS (SERVICE/REPAIRS)) ×4 IMPLANT
PACK CHEST (CUSTOM PROCEDURE TRAY) ×4 IMPLANT
PACK SURGICAL SETUP 50X90 (CUSTOM PROCEDURE TRAY) IMPLANT
PAD ARMBOARD 7.5X6 YLW CONV (MISCELLANEOUS) ×16 IMPLANT
PASSER SUT SWANSON 36MM LOOP (INSTRUMENTS) IMPLANT
PATCH GORETEX 10X15 (Vascular Products) ×4 IMPLANT
PENCIL BUTTON HOLSTER BLD 10FT (ELECTRODE) ×4 IMPLANT
RELOAD EGIA 45 MED/THCK PURPLE (STAPLE) ×4 IMPLANT
RELOAD EGIA 60 MED/THCK PURPLE (STAPLE) ×20 IMPLANT
RELOAD EGIA BLACK ROTIC 45MM (STAPLE) ×4 IMPLANT
RELOAD EGIA TRIS TAN 45 CVD (STAPLE) ×12 IMPLANT
SAW GIGLI STERILE 20 (MISCELLANEOUS) ×4 IMPLANT
SCISSORS LAP 5X35 DISP (ENDOMECHANICALS) IMPLANT
SEALANT PROGEL (MISCELLANEOUS) IMPLANT
SEALANT SURG COSEAL 4ML (VASCULAR PRODUCTS) IMPLANT
SEALANT SURG COSEAL 8ML (VASCULAR PRODUCTS) IMPLANT
SOLUTION ANTI FOG 6CC (MISCELLANEOUS) ×4 IMPLANT
SPONGE GAUZE 4X4 12PLY (GAUZE/BANDAGES/DRESSINGS) ×4 IMPLANT
SPONGE GAUZE 4X4 12PLY STER LF (GAUZE/BANDAGES/DRESSINGS) ×4 IMPLANT
SPONGE INTESTINAL PEANUT (DISPOSABLE) ×4 IMPLANT
SPONGE TONSIL 1 RF SGL (DISPOSABLE) ×4 IMPLANT
STAPLER ENDO GIA 12MM SHORT (STAPLE) ×3 IMPLANT
STAPLER VISISTAT 35W (STAPLE) ×8 IMPLANT
SUT PROLENE 2 0 CT 1 CR (SUTURE) ×12 IMPLANT
SUT PROLENE 3 0 SH DA (SUTURE) IMPLANT
SUT PROLENE 3 0 SH1 36 (SUTURE) ×8 IMPLANT
SUT PROLENE 4 0 RB 1 (SUTURE) ×2
SUT PROLENE 4-0 RB1 .5 CRCL 36 (SUTURE) ×6 IMPLANT
SUT PROLENE 5 0 C 1 36 (SUTURE) ×8 IMPLANT
SUT SILK  1 MH (SUTURE) ×4
SUT SILK 1 MH (SUTURE) ×12 IMPLANT
SUT SILK 1 TIES 10X30 (SUTURE) ×4 IMPLANT
SUT SILK 2 0 SH (SUTURE) IMPLANT
SUT SILK 2 0SH CR/8 30 (SUTURE) IMPLANT
SUT SILK 3 0 SH CR/8 (SUTURE) ×4 IMPLANT
SUT SILK 3 0SH CR/8 30 (SUTURE) IMPLANT
SUT STEEL 1 (SUTURE) IMPLANT
SUT VIC AB 1 CTX 18 (SUTURE) ×12 IMPLANT
SUT VIC AB 1 CTX 36 (SUTURE) ×2
SUT VIC AB 1 CTX36XBRD ANBCTR (SUTURE) ×6 IMPLANT
SUT VIC AB 2-0 CTX 36 (SUTURE) ×8 IMPLANT
SUT VIC AB 2-0 UR6 27 (SUTURE) IMPLANT
SUT VIC AB 3-0 SH 18 (SUTURE) IMPLANT
SUT VIC AB 3-0 SH 8-18 (SUTURE) ×8 IMPLANT
SUT VIC AB 3-0 X1 27 (SUTURE) ×8 IMPLANT
SUT VICRYL 2 TP 1 (SUTURE) ×12 IMPLANT
SUT VICRYL 4-0 PS2 18IN ABS (SUTURE) IMPLANT
SWAB COLLECTION DEVICE MRSA (MISCELLANEOUS) IMPLANT
SYR 20ML ECCENTRIC (SYRINGE) ×4 IMPLANT
SYRINGE 10CC LL (SYRINGE) ×4 IMPLANT
SYSTEM SAHARA CHEST DRAIN ATS (WOUND CARE) ×4 IMPLANT
TAPE CLOTH SURG 6X10 WHT LF (GAUZE/BANDAGES/DRESSINGS) ×4 IMPLANT
TAPE STRIPS DRAPE STRL (GAUZE/BANDAGES/DRESSINGS) ×4 IMPLANT
TAPE UMBILICAL COTTON 1/8X30 (MISCELLANEOUS) ×4 IMPLANT
TIP APPLICATOR SPRAY EXTEND 16 (VASCULAR PRODUCTS) IMPLANT
TOWEL OR 17X24 6PK STRL BLUE (TOWEL DISPOSABLE) ×8 IMPLANT
TOWEL OR 17X26 10 PK STRL BLUE (TOWEL DISPOSABLE) ×8 IMPLANT
TRAP SPECIMEN MUCOUS 40CC (MISCELLANEOUS) ×8 IMPLANT
TRAY FOLEY CATH 16FRSI W/METER (SET/KITS/TRAYS/PACK) ×4 IMPLANT
TUBE ANAEROBIC SPECIMEN COL (MISCELLANEOUS) IMPLANT
TUBE CONNECTING 12X1/4 (SUCTIONS) ×8 IMPLANT
TUNNELER SHEATH ON-Q 11GX8 DSP (PAIN MANAGEMENT) ×4 IMPLANT
WATER STERILE IRR 1000ML POUR (IV SOLUTION) ×8 IMPLANT

## 2013-07-30 NOTE — Brief Op Note (Addendum)
      [address]       [place] [address]             [phone]    07/30/2013  3:40 PM  PATIENT:  Terry Robles  73 y.o. male  PRE-OPERATIVE DIAGNOSIS:  1.Squamous Cell Carcinoma of RUL with chest wall involvement 2. History of chemo and radiation  POST-OPERATIVE DIAGNOSIS:  1.Squamous Cell Carcinoma of RUL with chest wall involvement 2. History of chemo and radiation   PROCEDURE:  VIDEO BRONCHOSCOPY, MEDIASTINOSCOPY, RIGT VIDEO ASSISTED THORACOSCOPY, RIGHT MAJOR THORACOTOMY FOR RIGHT UPPER LOBECTOMY and PARTIAL CHEST WALL RESECTION (INCLUDING REMOVAL OF PART OF RIBS 2-4), RECONSTRUCTION of CHEST WALL using GORTEX PATCH, WEDGE of RLL,and LYMPH NODE DISSECTION  SURGEON:  Surgeon(s) and Role:    * Grace Isaac, MD - Primary  PHYSICIAN ASSISTANT: Lars Pinks PA-C  ANESTHESIA:   general  EBL:  Total I/O In: 3070 [I.V.:1900; Blood:670; IV Piggyback:500] Out: 1610 [Urine:760; Blood:850]  BLOOD ADMINISTERED:Two CC PRBC  DRAINS: 28 French straight chest tube and Blake drain placed in the right pleural space   SPECIMEN:  Source of Specimen:  RUL with chest wall resection,multiple lymph nodes, posterior chest wall biopsy, and wedge of RUL  DISPOSITION OF SPECIMEN:  PATHOLOGY  COUNTS CORRECT:  YES  DICTATION: .Dragon Dictation  PLAN OF CARE: Admit to inpatient   PATIENT DISPOSITION:  PACU - hemodynamically stable.   Delay start of Pharmacological VTE agent (>24hrs) due to surgical blood loss or risk of bleeding: yes

## 2013-07-30 NOTE — H&P (Signed)
[address]       [place] [address]             [phone]                       Huel K [facility]avan [facility] Medical Record #[id number] Date of Birth: 16-Mar-1941  Referring: Dr Julien Nordmann Primary Care: [name], MD  Chief Complaint: Right Upper Lobe Lung lesion with chest wall involvement   History of Present Illness:    Terry Robles 73 y.o. male is seen in the office  today for right upper lobe lung lesion with chest wall involvement. He has  past medical history of  dyslipidemia and benign prostatic hypertrophy as well as long history of smoking but quit 15 years ago. The patient noted   cough for more than 6 months but 4 weeks ago his cough was associated with hemoptysis. His primary care physician Dr. Iona Beard.  ordered CT scan of the chest which was performed on 04/12/2013 and it showed 8.5 x 6.7 x 8.7 CM a right Pancoast tumor invading the typical chest with bony destruction of the right second and third ribs along the tumor margin. There was confluent adenopathy related to the mass present in the right suprahilar and hilar regions. 1 lymph node measured 1.6 CM in short axis and a lower right peritracheal node measured 0.8 CM in short axis. The tumor and adjacent adenopathy cause extrinsic narrowing of the right upper lobe pulmonary artery.  A PET scan was performed in 04/25/2013. It showed a large hypermetabolic right upper lobe mass consistent with primary bronchogenic carcinoma. There is hypermetabolic right hilar metastatic lymph nodes and mild metabolic activity of right lower paratracheal lymph node concerning for metastases but indeterminate.   On 04/30/2013, the patient underwent CT-guided core biopsy of the right upper lobe lung lesion by interventional radiology.  The final pathology (Accession: [id number]) also right upper lobe biopsy was consistent with non-small cell carcinoma. The core biopsies are extensively involved by non small cell  carcinoma associated with large areas of necrosis. The morphologic features favor squamous cell carcinoma. Immunohistochemistry is performed and the tumor is positive with Cytokeratin 5/6, Cytokeratin 903 and p63. The tumor is negative with Cytokeratin 7, Napsin A, Thyroid Transcription Factor -1 (TTF-1) and Cytokeratin 20 The immunohistochemical findings are consistent with the morphologic impression of squamous cell carcinoma.    Concurrent chemoradiation with weekly carboplatin for an AUC of 2 and paclitaxel 45 mg per meter squared. Patient completed radiation two weeks ago today.  Patient notes that he has significantly gained strength over the past month as he is recovered from radiation and chemotherapy. The right shoulder in back pain has significantly improved. He denies shortness of breath, he returns today with a followup CT scan of the chest to evaluate response from the previous chemotherapy and radiation and for consideration of surgical resection.    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   Zubrod Score: At the time of surgery this patient's most appropriate activity status/level should be described as: []   0    Normal activity, no symptoms [x]     1    Restricted in physical strenuous activity but ambulatory, able to do out light work []     2    Ambulatory and capable of self care, unable to do work activities, up and about               >50 % of waking hours                              []     3    Only limited self care, in bed greater than 50% of waking hours []     4    Completely disabled, no self care, confined to bed or chair []     5     Moribund   Past Medical History  Diagnosis Date  . Hypercholesteremia 05/14/2011  . BPH (benign prostatic hyperplasia) 05/14/2011  . BPH (benign prostatic hyperplasia)   . Shortness of breath     with exertion  . Wears glasses   . Pneumonia   . GERD (gastroesophageal reflux disease)   . Headache(784.0)   . Cancer     skin cancer squamous on left arm    Past Surgical History  Procedure Laterality Date  . Tonsillectomy    . Other surgical history      benign tumor removed from left leg several years ago  . Other surgical history      had melanoma/squamous cell removed previously per patient  . Tumor excision      Left knee  . Colonoscopy w/ biopsies and polypectomy      Family History  Problem Relation Age of Onset  . COPD Paternal Grandfather   . Breast cancer Paternal Aunt   Family history significant for her father who died from hepatitis secondary to blood transfusion, mother died from COPD at age 87 and paternal grandmother died from pancreatic cancer, maternal grandfather died from a stroke and brother died from melanoma at age 77.   History   Social History  . Marital Status: Married    Spouse Name: N/A    Number of Children: N/A  . Years of Education: N/A   Occupational History  . Not on file.   Social History Main Topics  . Smoking status: Former Smoker -- 1.00 packs/day for 42 years    Types: Cigarettes    Quit date: 03/28/1998  . Smokeless tobacco: Never Used  . Alcohol Use: Yes     Comment: occasional/ beer - no more than one beer in 24 hrs   . Drug Use: No  . Sexual Activity: Not on file   Other Topics Concern  . Not on file   Social History Narrative  . No narrative on file    History  Smoking status  . Former Smoker -- 1.00 packs/day for 42 years  . Types: Cigarettes  . Quit date: 03/28/1998  Smokeless tobacco  . Never Used    History  Alcohol Use  . Yes    Comment: occasional/ beer - no more than one beer in 24 hrs      No  Known Allergies  Current Facility-Administered Medications  Medication Dose Route Frequency Provider Last Rate Last Dose  . cefUROXime (ZINACEF) 1.5 g in dextrose 5 % 50 mL IVPB  1.5 g Intravenous 60 min Pre-Op Grace Isaac, MD       Facility-Administered Medications Ordered in  Other Encounters  Medication Dose Route Frequency Provider Last Rate Last Dose  . lactated ringers infusion    Continuous PRN Moshe Salisbury, CRNA      . lactated ringers infusion    Continuous PRN Moshe Salisbury, CRNA      . midazolam (VERSED) 5 MG/5ML injection    Anesthesia Intra-op Moshe Salisbury, CRNA   1 mg at 07/30/13 0650  . SUFentanil (SUFENTA) injection    Anesthesia Intra-op Moshe Salisbury, CRNA   5 mcg at 07/30/13 7846     Review of Systems:     Cardiac Review of Systems: Y or N  Chest Pain [  n  ]  Resting SOB [  n ] Exertional SOB  [ mild ]  Orthopnea [  n]   Pedal Edema [ n  ]    Palpitations [n  ] Syncope  [n  ]   Presyncope [  n ]  General Review of Systems: [Y] = yes [  ]=no Constitional: recent weight change [  ];  Wt loss over the last 3 months [ 15 lbs  ] anorexia Blue.Reese  ]; fatigue [  y]; nausea [  ]; night sweats [ n ]; fever [n  ]; or chills [ n ];          Dental: poor dentition[n  ]; Last Dentist visit:   Eye : blurred vision [  ]; diplopia [   ]; vision changes [  ];  Amaurosis fugax[  ]; Resp: cough [ y ];  wheezing[  ];  hemoptysis[ y ]; shortness of breath[y  ]; paroxysmal nocturnal dyspnea[  ]; dyspnea on exertion[ y ]; or orthopnea[  ];  GI:  gallstones[  ], vomiting[  ];  dysphagia[  ]; melena[  ];  hematochezia [  ]; heartburn[  ];   Hx of  Colonoscopy[ y ]; GU: kidney stones [  ]; hematuria[  ];   dysuria [  ];  nocturia[  ];  history of     obstruction [  ]; urinary frequency [n  ]             Skin: rash, swelling[  ];, hair loss[  ];  peripheral edema[  ];  or itching[  ]; Musculosketetal: myalgias[  ];  joint swelling[  ];  joint erythema[  ];  joint pain[  ];  back pain[   ];  Heme/Lymph: bruising[  ];  bleeding[  ];  anemia[  ];  Neuro: TIA[  ];  headaches[  ];  stroke[  ];  vertigo[  ];  seizures[  ];   paresthesias[  ];  difficulty walking[n  ];  Psych:depression[  ]; anxiety[  ];  Endocrine: diabetes[ n ];  thyroid dysfunction[n  ];  Immunizations: Flu up to date [ y ]; Pneumococcal up to date Blue.Reese  ];  Other:  Physical Exam: 110/62 74 97.2 F (36.2 C) (Oral) 18 6\' 1"  (1.854 m) 219 lb 8 oz (99.565 kg)    Wt Readings from Last 3 Encounters:  07/29/13 211 lb 4.8 oz (95.845 kg)  07/26/13 211 lb 3.2 oz (95.8 kg)  07/25/13 212 lb (96.163 kg)    PHYSICAL EXAMINATION:  General appearance: alert, cooperative, appears older than stated age and no distress Neurologic: intact Heart: regular rate and rhythm, S1, S2 normal, no murmur, click, rub or gallop Lungs: diminished breath sounds RUL Abdomen: soft, non-tender; bowel sounds normal; no masses,  no organomegaly Extremities: extremities normal,  atraumatic, no cyanosis or edema and Homans sign is negative, no sign of DVT Patient has no cervical or supraclavicular adenopathy no axillary adenopathy, DP and PT pulses are 2+ bilaterally brachial and radial pulses are 2+ bilaterally   Diagnostic Studies & Laboratory data:     Recent Radiology Findings:  Ct Chest W Contrast  07/24/2013   CLINICAL DATA:  Lung cancer restaging, chemotherapy ongoing  EXAM: CT CHEST WITH CONTRAST  TECHNIQUE: Multidetector CT imaging of the chest was performed during intravenous contrast administration.  CONTRAST:  65mL OMNIPAQUE IOHEXOL 300 MG/ML  SOLN  COMPARISON:  PET-CT dated 04/25/2013.  CT chest dated 04/12/2013.  FINDINGS: 5.9 x 7.0 x 6.7 cm posterior right upper lobe pulmonary mass (series 6/ image 10), previously 6.7 x 8.5 x 8.7 cm on prior CT. Mass invades the chest wall at the posterior right lung apex (series 3/ image 6). Mass invades the right posterior 2nd and 3rd ribs (series 3/image 5).  Mild patchy/ground-glass opacities  in the superior segment right lower lobe (series 6/images 21-22), new, possibly infectious/ inflammatory. No suspicious pulmonary nodules. Moderate centrilobular and paraseptal emphysematous changes.  No pleural effusion or pneumothorax.  Visualized thyroid is unremarkable.  The heart is normal in size. No pericardial effusion. Coronary atherosclerosis. Atherosclerotic calcifications of the aortic arch.  6 mm short axis right paratracheal node (series 3/image 16), previously 8 mm. 7 mm short axis right hilar node (series 3/ image 21), previously 16 mm. 5 mm short axis right infrahilar/ interlobar node (series 3/ image 29), previously 7 mm.  Visualized upper abdomen is notable for layering gallstones (series 3/ image 61).  Stable 5.3 x 2.0 cm subcutaneous soft tissue lesion in the left anterior chest wall (series 3/ image 43), non FDG avid.  Degenerative changes of the visualized thoracolumbar spine.  IMPRESSION: 7.0 cm posterior right upper lobe pulmonary mass, corresponding to known primary bronchogenic neoplasm.  Stable invasion into the right upper chest wall/ribs.  Associated mild thoracic lymphadenopathy, decreased.   Electronically Signed   By: Julian Hy M.D.   On: 07/24/2013 14:01  Mr Jeri Cos MH Contrast  05/14/2013   CLINICAL DATA:  Headaches. Staging of new non-small-cell lung cancer.  EXAM: MRI HEAD WITHOUT AND WITH CONTRAST  TECHNIQUE: Multiplanar, multiecho pulse sequences of the brain and surrounding structures were obtained without and with intravenous contrast.  CONTRAST:  28mL MULTIHANCE GADOBENATE DIMEGLUMINE 529 MG/ML IV SOLN  COMPARISON:  None.  FINDINGS: Incidental note is made of a partially empty sella. There is no acute infarct. There is no evidence of intracranial hemorrhage. Ventricles and sulci are within normal limits for age. Small, scattered foci of T2 hyperintensity within the deep cerebral white matter are nonspecific but compatible with minimal, age-related small vessel  ischemic change. There is no mass, midline shift or extra-axial fluid collection. There is no abnormal enhancement.  Major intracranial vascular flow voids are unremarkable. Orbits are unremarkable. Paranasal sinuses and mastoid air cells are clear. Calvarium is normal in signal.  IMPRESSION: No evidence of intracranial metastases.   Electronically Signed   By: Logan Bores   On: 05/14/2013 15:07   Dg Chest 1 View  04/30/2013   CLINICAL DATA:  Right upper lobe mass, post percutaneous biopsy  EXAM: CHEST - 1 VIEW  COMPARISON:  04/25/2013 and earlier studies  FINDINGS: No pneumothorax. Stable right upper lobe/apical mass with probable pathologic fracture through the posterior aspect right second rib. Left lung clear. Heart size normal. No effusion.  IMPRESSION: No  pneumothorax post right lung biopsy   Electronically Signed   By: Arne Cleveland M.D.   On: 04/30/2013 13:12   Ct Chest W Contrast  04/12/2013   CLINICAL DATA:  Hemoptysis.  EXAM: CT CHEST WITH CONTRAST  TECHNIQUE: Multidetector CT imaging of the chest was performed during intravenous contrast administration.  CONTRAST:  14mL OMNIPAQUE IOHEXOL 300 MG/ML  SOLN  COMPARISON:  DG CHEST 1V PORT dated 05/14/2011  FINDINGS: 8.5 x 6.7 by 8.7 cm right Pancoast tumor invades the typical chest wall with bony destruction of the right second and third ribs along the tumor margin. Confluent adenopathy related to the mass is present in the right suprahilar and hilar regions. One hilar node on image 27 of series 2 measures 1.6 cm in short axis. A lower right paratracheal node measures 0.8 cm in short axis on image 20 of series 2.  The tumor and adjacent adenopathy cause extrinsic narrowing of the right upper lobe pulmonary artery. Please note that although today's exam does not show obvious clot in the large pulmonary arteries, it was not protocol does a CT angiography examination.  The smoothly marginated 5.3 x 1.9 by 3.8 cm multilobulated subcutaneous lesion with  internal density of approximately 44 Hounsfield units is present along the left anterolateral chest wall. Faint posterior calcification along the margin of this lesion.  Thoracic spondylosis noted. Centrilobular emphysema. 3 mm left upper lobe pulmonary nodule, image 28 of series 3. 4 mm left lower lobe pulmonary nodule, image 42 of series 3. 0.8 x 0.6 cm subpleural nodule in the left lower lobe, image 35 of series 3.  IMPRESSION: 1. 8.5 cm right apical lung cancer (Pancoast tumor) invades the right apical/posterior chest wall with bony invasion of the right second and third ribs. Confluent right hilar and suprahilar adenopathy. 2. Several left-sided pulmonary ill nodules, largest 7 mm in average size, nonspecific for benign versus malignant nodules. 3. Smoothly marginated and fairly homogeneous 5.3 x 1.9 x 3.8 cm multilobulated subcutaneous lesion if along the left lateral chest wall noted. This is homogeneous could represent a ganglion cyst or benign lesion. A subcutaneous metastatic lesion would be unusual but not impossible in light of the right lung findings. This lesion is probably readily palpable; a long patient history of its palpable lesion in this vicinity without significant change would favor a benign etiology for this left-sided lesion.   Electronically Signed   By: Sherryl Barters M.D.   On: 04/12/2013 09:25   Nm Pet Image Initial (pi) Skull Base To Thigh  04/25/2013   CLINICAL DATA:  Initial treatment strategy for lung mass.  EXAM: NUCLEAR MEDICINE PET SKULL BASE TO THIGH  FASTING BLOOD GLUCOSE:  Value: 104mg /dl  TECHNIQUE: 16.6 mCi F-18 FDG was injected intravenously. CT data was obtained and used for attenuation correction and anatomic localization only. (This was not acquired as a diagnostic CT examination.) Additional exam technical data entered on technologist worksheet.  COMPARISON:  DG CHEST 1V PORT dated 05/14/2011; CT CHEST W/CM dated 04/12/2013  FINDINGS: NECK  No hypermetabolic lymph  nodes in the neck.  CHEST  Large right upper lobe mass measuring 7.6 x 7.1 cm has intense metabolic activity with SUV max 25.9. There is a hypermetabolic right hilar lymph node measuring 22 mm (image 93, series 2) with SUV max 21.7.  There is a mildly enlarged right lower paratracheal node measuring 12 mm short axis (image 80) with mild metabolic activity (SUV max 3.4). No supraclavicular or contralateral hypermetabolic nodes.  ABDOMEN/PELVIS  No abnormal hypermetabolic activity within the liver, pancreas, adrenal glands, or spleen. No hypermetabolic lymph nodes in the abdomen or pelvis.  SKELETON  No focal hypermetabolic activity to suggest skeletal metastasis.  IMPRESSION: 1. Large hypermetabolic right upper lobe mass consists with primary bronchogenic carcinoma. 2. Hypermetabolic right hilar metastatic lymph node. 3. Mild metabolic activity of right lower paratracheal lymph node is concerning metastasis but indeterminate. 4. Staging by FDG PET-CT imaging T3 N2 M0   Electronically Signed   By: Suzy Bouchard M.D.   On: 04/25/2013 09:38   Ct Biopsy  04/30/2013   CLINICAL DATA:  Hypermetabolic right apical lung lesion with chest wall involvement  EXAM: CT GUIDED CORE BIOPSY OF RIGHT UPPER LOBE LUNG LESION  ANESTHESIA/SEDATION: Intravenous Fentanyl and Versed were administered as conscious sedation during continuous cardiorespiratory monitoring by the radiology RN, with a total moderate sedation time of 7 minutes.  PROCEDURE: The procedure risks, benefits, and alternatives were explained to the patient. Questions regarding the procedure were encouraged and answered. The patient understands and consents to the procedure.  The posterior right upper chest was prepped with betadinein a sterile fashion, and a sterile drape was applied covering the operative field. A sterile gown and sterile gloves were used for the procedure. Local anesthesia was provided with 1% Lidocaine.  Under CT fluoroscopic guidance, a 17 gauge  trocar needle was advanced to the margin of the lesion. Once needle tip position was confirmed, coaxial 18-gauge core biopsy samples were obtained, submitted in formalin to surgical pathology. The guide needle was removed. Postprocedure scans show no pneumothorax or hemorrhage. The patient tolerated the procedure well.  Complications: none  FINDINGS: Right apical lung mass with posterior chest wall involvement. Technically successful core biopsy as above.  IMPRESSION: 1. Technically successful right upper lobe lung core needle biopsy. Observation and follow-up radiography scheduled.   Electronically Signed   By: Arne Cleveland M.D.   On: 04/30/2013 13:35      Recent Lab Findings: Lab Results  Component Value Date   WBC 5.4 07/26/2013   HGB 12.7* 07/26/2013   HCT 37.7* 07/26/2013   PLT 254 07/26/2013   GLUCOSE 110* 07/26/2013   CHOL 161 05/14/2011   TRIG 146 05/14/2011   HDL 49 05/14/2011   LDLCALC 83 05/14/2011   ALT 12 07/26/2013   AST 16 07/26/2013   NA 139 07/26/2013   K 4.1 07/26/2013   CL 103 07/26/2013   CREATININE 0.75 07/26/2013   BUN 11 07/26/2013   CO2 20 07/26/2013   INR 1.01 07/26/2013   PFT's  2.89 79%  DLCO 19.97  53%   Assessment / Plan:   Squamous cell carcinoma of the lung/Pancoast tumor right superior sulcus clinical stage   cT3,cN2,cM0  Clincial  Stage IIIa   Mild metabolic activity of right lower paratracheal lymph node is  concerning metastasis but indeterminate.  MRI of Brain negative Patient has  tolerating concurrent radiation and chemotherapy treatment well with response, decreasing size of the mass and decreasing size of the sternal lymph nodes The patient's followup CT scan was reviewed today in the multidisciplinary thoracic oncology conference. With the patient's overall  good physical condition and significant response to preoperative treatment is been recommended to the patient that we proceed with bronchoscopy ebus  mediastinoscopy right video-assisted thoracoscopy lung resection  and chest wall resection. The significance of this procedure has been discussed with the patient and his wife in detail. If the mediastinal lymph nodes are positive on initial  exam we will not proceed with resection but continue with current chemoradiation, if with ebus and or mediastinoscopy mediastinal nodes are negative we will proceed at the same setting with resection.   The goals risks and alternatives of the planned surgical procedure Bronchoscopy, EBUS/mediastinoscpy, right thoracotomy lung resection and chest wall resection have been discussed with the patient in detail. The risks of the procedure including death, infection, stroke, myocardial infarction, bleeding, blood transfusion, brachial plexus injury have all been discussed specifically.  I have quoted Terry Robles a 5 % of perioperative mortality and a complication rate as high as 25%. The patient's questions have been answered.Terry Robles is willing  to proceed with the planned procedure.  Grace Isaac MD      [address] [place] [address] Office [phone]   Beeper [phone]  07/30/2013 7:20 AM

## 2013-07-30 NOTE — Anesthesia Postprocedure Evaluation (Signed)
  Anesthesia Post-op Note  Patient: Terry Robles  Procedure(s) Performed: Procedure(s): VIDEO BRONCHOSCOPY (N/A) MEDIASTINOSCOPY (N/A) VIDEO ASSISTED THORACOSCOPY (Right) THORACOTOMY FOR CHEST WALL RESECTION, RIGHT UPPER LOBECTOMY  Patient Location: PACU  Anesthesia Type:General  Level of Consciousness: awake, alert , oriented and patient cooperative  Airway and Oxygen Therapy: Patient Spontanous Breathing and Patient connected to nasal cannula oxygen  Post-op Pain: mild  Post-op Assessment: Post-op Vital signs reviewed, Patient's Cardiovascular Status Stable, Respiratory Function Stable, Patent Airway, No signs of Nausea or vomiting and Pain level controlled  Post-op Vital Signs: Reviewed and stable  Last Vitals:  Filed Vitals:   07/30/13 1645  BP: 97/40  Pulse: 66  Temp:   Resp: 12    Complications: No apparent anesthesia complications

## 2013-07-30 NOTE — Anesthesia Procedure Notes (Addendum)
Procedure Name: Intubation Date/Time: 07/30/2013 7:41 AM Performed by: Melina Copa, DAVID R Pre-anesthesia Checklist: Patient identified, Emergency Drugs available, Suction available, Patient being monitored and Timeout performed Patient Re-evaluated:Patient Re-evaluated prior to inductionOxygen Delivery Method: Circle system utilized Preoxygenation: Pre-oxygenation with 100% oxygen Intubation Type: IV induction Ventilation: Mask ventilation without difficulty Laryngoscope Size: Mac and 4 Grade View: Grade II Tube type: Oral Tube size: 8.5 mm Number of attempts: 1 Airway Equipment and Method: Stylet Placement Confirmation: ETT inserted through vocal cords under direct vision,  positive ETCO2 and breath sounds checked- equal and bilateral Secured at: 21 cm Tube secured with: Tape Dental Injury: Teeth and Oropharynx as per pre-operative assessment     Procedure Name: Intubation Date/Time: 07/30/2013 9:37 AM Performed by: Melina Copa, DAVID R Pre-anesthesia Checklist: Emergency Drugs available, Patient identified, Suction available and Patient being monitored Patient Re-evaluated:Patient Re-evaluated prior to inductionOxygen Delivery Method: Circle system utilized Preoxygenation: Pre-oxygenation with 100% oxygen Intubation Type: Combination inhalational/ intravenous induction Laryngoscope Size: Mac and 4 Grade View: Grade II Tube type: Oral Endobronchial tube: Right, Double lumen EBT, EBT position confirmed by auscultation and EBT position confirmed by fiberoptic bronchoscope and 39 Fr Number of attempts: 1 Airway Equipment and Method: Stylet and Fiberoptic brochoscope Placement Confirmation: ETT inserted through vocal cords under direct vision,  positive ETCO2 and breath sounds checked- equal and bilateral Secured at: 32 cm Tube secured with: Tape Dental Injury: Teeth and Oropharynx as per pre-operative assessment

## 2013-07-30 NOTE — OR Nursing (Signed)
First procedure (bronchoscopy) end time :0815

## 2013-07-30 NOTE — Progress Notes (Signed)
Patient ID: Terry Robles, male   DOB: 12/31/1940, 73 y.o.   MRN: [id number] EVENING ROUNDS NOTE :     [address]       [place] [address]             [phone]                 Day of Surgery Procedure(s) (LRB): VIDEO BRONCHOSCOPY (N/A) MEDIASTINOSCOPY (N/A) VIDEO ASSISTED THORACOSCOPY (Right) THORACOTOMY FOR CHEST WALL RESECTION, RIGHT UPPER LOBECTOMY  Total Length of Stay:  LOS: 0 days  BP 104/59  Pulse 67  Temp(Src) 98 F (36.7 C) (Oral)  Resp 11  SpO2 100%  .Intake/Output     05/05 0701 - 05/06 0700   I.V. 3520   Blood 670   IV Piggyback 550   Total Intake 4740   Urine 940   Blood 850   Chest Tube 230   Total Output 2020   Net +2720         . dextrose 5 % and 0.9 % NaCl with KCl 20 mEq/L 125 mL/hr at 07/30/13 1815     Lab Results  Component Value Date   WBC 5.4 07/26/2013   HGB 12.7* 07/26/2013   HCT 37.7* 07/26/2013   PLT 254 07/26/2013   GLUCOSE 110* 07/26/2013   CHOL 161 05/14/2011   TRIG 146 05/14/2011   HDL 49 05/14/2011   LDLCALC 83 05/14/2011   ALT 12 07/26/2013   AST 16 07/26/2013   NA 139 07/26/2013   K 4.1 07/26/2013   CL 103 07/26/2013   CREATININE 0.75 07/26/2013   BUN 11 07/26/2013   CO2 20 07/26/2013   INR 1.01 07/26/2013   Extubated Neuro intact Not bleeding   Grace Isaac MD  Beeper [phone] Office [phone] 07/30/2013 7:20 PM

## 2013-07-30 NOTE — Progress Notes (Signed)
We have tried to find the family twice since adm to pacu, LOOKED IN ALL WAITING AREAS. UNABLE TO LOCATE THEM.

## 2013-07-30 NOTE — Anesthesia Preprocedure Evaluation (Addendum)
Anesthesia Evaluation  Patient identified by MRN, date of birth, ID band Patient awake    Reviewed: Allergy & Precautions, H&P , NPO status , Patient's Chart, lab work & pertinent test results  History of Anesthesia Complications Negative for: history of anesthetic complications  Airway Mallampati: II TM Distance: >3 FB Neck ROM: Full    Dental  (+) Partial Lower, Partial Upper, Dental Advisory Given   Pulmonary shortness of breath, former smoker,  Right pulmonary lesion breath sounds clear to auscultation        Cardiovascular negative cardio ROS  Rhythm:Regular     Neuro/Psych  Headaches, negative psych ROS   GI/Hepatic Neg liver ROS, GERD-  Controlled,  Endo/Other  negative endocrine ROS  Renal/GU negative Renal ROS     Musculoskeletal negative musculoskeletal ROS (+)   Abdominal   Peds  Hematology  (+) anemia ,   Anesthesia Other Findings   Reproductive/Obstetrics                          Anesthesia Physical Anesthesia Plan  ASA: III  Anesthesia Plan: General   Post-op Pain Management:    Induction: Intravenous  Airway Management Planned: Oral ETT and Double Lumen EBT  Additional Equipment: Arterial line, CVP and Ultrasound Guidance Line Placement  Intra-op Plan:   Post-operative Plan: Extubation in OR  Informed Consent: I have reviewed the patients History and Physical, chart, labs and discussed the procedure including the risks, benefits and alternatives for the proposed anesthesia with the patient or authorized representative who has indicated his/her understanding and acceptance.   Dental advisory given  Plan Discussed with: CRNA, Anesthesiologist and Surgeon  Anesthesia Plan Comments:        Anesthesia Quick Evaluation

## 2013-07-30 NOTE — Transfer of Care (Signed)
Immediate Anesthesia Transfer of Care Note  Patient: Terry Robles  Procedure(s) Performed: Procedure(s): VIDEO BRONCHOSCOPY (N/A) MEDIASTINOSCOPY (N/A) VIDEO ASSISTED THORACOSCOPY (Right) THORACOTOMY FOR CHEST WALL RESECTION, RIGHT UPPER LOBECTOMY  Patient Location: PACU  Anesthesia Type:General  Level of Consciousness: awake and responds to stimulation  Airway & Oxygen Therapy: Patient Spontanous Breathing and Patient connected to nasal cannula oxygen  Post-op Assessment: Report given to PACU RN, Post -op Vital signs reviewed and stable and Patient moving all extremities X 4  Post vital signs: Reviewed and stable  Complications: No apparent anesthesia complications

## 2013-07-30 NOTE — Progress Notes (Signed)
Care of pt assumed by MA Jade Burright RN 

## 2013-07-31 ENCOUNTER — Inpatient Hospital Stay (HOSPITAL_COMMUNITY): Payer: Medicare Other

## 2013-07-31 LAB — TYPE AND SCREEN
ABO/RH(D): O POS
Antibody Screen: NEGATIVE
Unit division: 0
Unit division: 0

## 2013-07-31 LAB — CBC
HEMATOCRIT: 33.1 % — AB (ref 39.0–52.0)
HEMOGLOBIN: 11.2 g/dL — AB (ref 13.0–17.0)
MCH: 32.4 pg (ref 26.0–34.0)
MCHC: 33.8 g/dL (ref 30.0–36.0)
MCV: 95.7 fL (ref 78.0–100.0)
Platelets: 215 10*3/uL (ref 150–400)
RBC: 3.46 MIL/uL — ABNORMAL LOW (ref 4.22–5.81)
RDW: 19.7 % — ABNORMAL HIGH (ref 11.5–15.5)
WBC: 7.5 10*3/uL (ref 4.0–10.5)

## 2013-07-31 LAB — GLUCOSE, CAPILLARY
Glucose-Capillary: 115 mg/dL — ABNORMAL HIGH (ref 70–99)
Glucose-Capillary: 119 mg/dL — ABNORMAL HIGH (ref 70–99)
Glucose-Capillary: 119 mg/dL — ABNORMAL HIGH (ref 70–99)
Glucose-Capillary: 120 mg/dL — ABNORMAL HIGH (ref 70–99)
Glucose-Capillary: 132 mg/dL — ABNORMAL HIGH (ref 70–99)

## 2013-07-31 LAB — BASIC METABOLIC PANEL
BUN: 11 mg/dL (ref 6–23)
CALCIUM: 8.2 mg/dL — AB (ref 8.4–10.5)
CO2: 24 mEq/L (ref 19–32)
Chloride: 105 mEq/L (ref 96–112)
Creatinine, Ser: 0.65 mg/dL (ref 0.50–1.35)
GFR calc non Af Amer: >90 mL/min (ref >90)
GLUCOSE: 131 mg/dL — AB (ref 70–99)
Potassium: 3.8 mEq/L (ref 3.7–5.3)
SODIUM: 139 meq/L (ref 137–147)

## 2013-07-31 LAB — POCT I-STAT 3, ART BLOOD GAS (G3+)
Acid-base deficit: 1 mmol/L (ref 0.0–2.0)
Bicarbonate: 23.4 mEq/L (ref 20.0–24.0)
O2 Saturation: 86 %
Patient temperature: 98.2
TCO2: 24 mmol/L (ref 0–100)
pCO2 arterial: 35.3 mmHg (ref 35.0–45.0)
pH, Arterial: 7.427 (ref 7.350–7.450)
pO2, Arterial: 49 mmHg — ABNORMAL LOW (ref 80.0–100.0)

## 2013-07-31 MED ORDER — SODIUM CHLORIDE 0.9 % IJ SOLN
10.0000 mL | Freq: Two times a day (BID) | INTRAMUSCULAR | Status: DC
  Administered 2013-07-31 – 2013-08-05 (×10): 10 mL via INTRAVENOUS

## 2013-07-31 MED ORDER — KETOROLAC TROMETHAMINE 30 MG/ML IJ SOLN
30.0000 mg | Freq: Four times a day (QID) | INTRAMUSCULAR | Status: AC | PRN
  Administered 2013-07-31 – 2013-08-05 (×14): 30 mg via INTRAVENOUS
  Filled 2013-07-31 (×14): qty 1

## 2013-07-31 MED ORDER — NALOXONE HCL 0.4 MG/ML IJ SOLN: 0.4000 mg | INTRAMUSCULAR | Status: DC | PRN

## 2013-07-31 MED ORDER — DIPHENHYDRAMINE HCL 50 MG/ML IJ SOLN: 12.5000 mg | Freq: Four times a day (QID) | INTRAMUSCULAR | Status: DC | PRN

## 2013-07-31 MED ORDER — DIPHENHYDRAMINE HCL 12.5 MG/5ML PO ELIX
12.5000 mg | ORAL_SOLUTION | Freq: Four times a day (QID) | ORAL | Status: DC | PRN
  Filled 2013-07-31: qty 5

## 2013-07-31 MED ORDER — SODIUM CHLORIDE 0.9 % IJ SOLN: 9.0000 mL | INTRAMUSCULAR | Status: DC | PRN

## 2013-07-31 MED ORDER — FENTANYL 10 MCG/ML IV SOLN
INTRAVENOUS | Status: DC
  Administered 2013-07-31: 310 ug via INTRAVENOUS
  Administered 2013-07-31: 80 ug via INTRAVENOUS
  Administered 2013-07-31: 140 ug via INTRAVENOUS
  Administered 2013-08-01: 12:00:00 via INTRAVENOUS
  Administered 2013-08-01: 10 ug via INTRAVENOUS
  Administered 2013-08-01: 160 ug via INTRAVENOUS
  Administered 2013-08-01: 50 ug via INTRAVENOUS
  Administered 2013-08-01: 200 ug via INTRAVENOUS
  Administered 2013-08-01 – 2013-08-02 (×2): 120 ug via INTRAVENOUS
  Administered 2013-08-02: 230 ug via INTRAVENOUS
  Administered 2013-08-02: 190 ug via INTRAVENOUS
  Administered 2013-08-02: 30 ug via INTRAVENOUS
  Administered 2013-08-02 (×2): 110 ug via INTRAVENOUS
  Administered 2013-08-03: 90 ug via INTRAVENOUS
  Administered 2013-08-03: 70 ug via INTRAVENOUS
  Administered 2013-08-03: 230 ug via INTRAVENOUS
  Filled 2013-07-31 (×5): qty 50

## 2013-07-31 MED ORDER — ONDANSETRON HCL 4 MG/2ML IJ SOLN: 4.0000 mg | Freq: Four times a day (QID) | INTRAMUSCULAR | Status: DC | PRN

## 2013-07-31 MED ORDER — ALBUMIN HUMAN 5 % IV SOLN
12.5000 g | Freq: Once | INTRAVENOUS | Status: AC
  Administered 2013-07-31: 12.5 g via INTRAVENOUS
  Filled 2013-07-31: qty 250

## 2013-07-31 MED ORDER — SODIUM CHLORIDE 0.9 % IJ SOLN
10.0000 mL | INTRAMUSCULAR | Status: DC | PRN
  Administered 2013-07-31: 10 mL via INTRAVENOUS

## 2013-07-31 MED ORDER — ENOXAPARIN SODIUM 30 MG/0.3ML ~~LOC~~ SOLN
30.0000 mg | Freq: Two times a day (BID) | SUBCUTANEOUS | Status: DC
  Administered 2013-07-31 – 2013-08-06 (×12): 30 mg via SUBCUTANEOUS
  Filled 2013-07-31 (×15): qty 0.3

## 2013-07-31 NOTE — Progress Notes (Addendum)
      [address]       ,[place] [address]             [phone]      1 Day Post-Op Procedure(s) (LRB): VIDEO BRONCHOSCOPY (N/A) MEDIASTINOSCOPY (N/A) VIDEO ASSISTED THORACOSCOPY (Right) THORACOTOMY FOR CHEST WALL RESECTION, RIGHT UPPER LOBECTOMY  Subjective:  Terry Robles is having a lot of pain.  He states the PCA provides minimal relief.  He also asks that his foley catheter be removed.  Objective: Vital signs in last 24 hours: Temp:  [97 F (36.1 C)-98.4 F (36.9 C)] 98 F (36.7 C) (05/06 0711) Pulse Rate:  [64-92] 65 (05/06 0700) Cardiac Rhythm:  [-] Normal sinus rhythm (05/06 0600) Resp:  [9-21] 20 (05/06 0758) BP: (97-120)/(40-92) 112/57 mmHg (05/06 0700) SpO2:  [95 %-100 %] 100 % (05/06 0758) Arterial Line BP: (100-151)/(40-63) 114/47 mmHg (05/06 0700) Weight:  [209 lb (94.802 kg)] 209 lb (94.802 kg) (05/06 0600)  Intake/Output from previous day: 05/05 0701 - 05/06 0700 In: 6535.8 [P.O.:180; I.V.:5085.8; Blood:670; IV QGBEEFEOF:121] Out: 2775 [Urine:1395; Blood:850; Chest Tube:530]  General appearance: alert, cooperative and mild distress Heart: regular rate and rhythm Lungs: clear to auscultation bilaterally Abdomen: soft, non-tender; bowel sounds normal; no masses,  no organomegaly Wound: clean and dry, staples in place  Lab Results:  Recent Labs  07/31/13 0430  WBC 7.5  HGB 11.2*  HCT 33.1*  PLT 215   BMET:  Recent Labs  07/31/13 0430  NA 139  K 3.8  CL 105  CO2 24  GLUCOSE 131*  BUN 11  CREATININE 0.65  CALCIUM 8.2*    PT/INR: No results found for this basename: LABPROT, INR,  in the last 72 hours ABG    Component Value Date/Time   PHART 7.427 07/31/2013 0432   HCO3 23.4 07/31/2013 0432   TCO2 24 07/31/2013 0432   ACIDBASEDEF 1.0 07/31/2013 0432   O2SAT 86.0 07/31/2013 0432   CBG (last 3)   Recent Labs  07/30/13 1804 07/30/13 2339 07/31/13 0639  GLUCAP 137* 119* 132*    Assessment/Plan: S/P Procedure(s)  (LRB): VIDEO BRONCHOSCOPY (N/A) MEDIASTINOSCOPY (N/A) VIDEO ASSISTED THORACOSCOPY (Right) THORACOTOMY FOR CHEST WALL RESECTION, RIGHT UPPER LOBECTOMY  1. Chest tube- + air leak at baseline and with cough- 530 cc output since surgery, will leave chest tube to suction 2. Pulm- wean oxygen as tolerated, no pneumothorax on CXR, + atelectasis 3. Pain control- continue PCA, add Toradol, encouraged use of PO Oxy as well 4. BPH- wants foley removed, on Flomax- can attempt removal and if necessary reinsert 5. D/C Arterial Line 6. Decrease IV Fluids 7. Dispo- patient with increased pain this morning, not surprising due to extent of his chest wall resection, will add Toradol for additional relief, will discuss foley removal with staff, leave chest tubes to suction, leave in SICU today   LOS: 1 day    Erin Barrett 07/31/2013  Will increase pca pump and add toradol I have seen and examined Terry Robles and agree with the above assessment  and plan.  Grace Isaac MD Beeper [phone] Office [phone] 07/31/2013 9:10 AM

## 2013-07-31 NOTE — Progress Notes (Signed)
Utilization Review Completed.Neoma Laming T Dowell5/08/2013

## 2013-07-31 NOTE — Care Management Note (Signed)
    Page 1 of 1   07/31/2013     3:05:03 PM CARE MANAGEMENT NOTE 07/31/2013  Patient:  Terry Robles, Terry Robles   Account Number:  [id number]  Date Initiated:  07/31/2013  Documentation initiated by:  [facility]  Subjective/Objective Assessment:   post op thorocotomy for upper lobectomy.     Action/Plan:   Anticipated DC Date:  08/05/2013   Anticipated DC Plan:  [place]  CM consult      Choice offered to / List presented to:             Status of service:  In process, will continue to follow Medicare Important Message given?  YES (If response is "NO", the following Medicare IM given date fields will be blank) Date Medicare IM given:  07/26/2013 Date Additional Medicare IM given:    Discharge Disposition:    Per UR Regulation:  Reviewed for med. necessity/level of care/duration of stay  If discussed at [place] of Stay Meetings, dates discussed:    Comments:  ContactMonti, Jilek Spouse [phone]  07-31-13 [place], [facility] IM placed on chart - ready for second prior to dc. Pain better this pm, resting in bed.  States lives with wife who walks with a walker and has a motorized w/c. States she cooks and he does also and his daughters, one 2 miles away and one 4 miles away will also be available to assist.  CM will continue to follow for further needs.

## 2013-07-31 NOTE — Progress Notes (Signed)
[facility] BRIEF SICU PROGRESS NOTE  1 Day Post-Op  S/P Procedure(s) (LRB): VIDEO BRONCHOSCOPY (N/A) MEDIASTINOSCOPY (N/A) VIDEO ASSISTED THORACOSCOPY (Right) THORACOTOMY FOR CHEST WALL RESECTION, RIGHT UPPER LOBECTOMY   Stable day Adequate analgesia O2 sats 96% UOP 25-30 mL/hr  Plan: Continue current plan  Rexene Alberts 07/31/2013 7:37 PM

## 2013-08-01 ENCOUNTER — Inpatient Hospital Stay (HOSPITAL_COMMUNITY): Payer: Medicare Other

## 2013-08-01 LAB — COMPREHENSIVE METABOLIC PANEL
ALT: 24 U/L (ref 0–53)
AST: 38 U/L — ABNORMAL HIGH (ref 0–37)
Albumin: 2.7 g/dL — ABNORMAL LOW (ref 3.5–5.2)
Alkaline Phosphatase: 117 U/L (ref 39–117)
BUN: 10 mg/dL (ref 6–23)
CALCIUM: 8.5 mg/dL (ref 8.4–10.5)
CO2: 24 meq/L (ref 19–32)
Chloride: 106 mEq/L (ref 96–112)
Creatinine, Ser: 0.71 mg/dL (ref 0.50–1.35)
GLUCOSE: 110 mg/dL — AB (ref 70–99)
Potassium: 4 mEq/L (ref 3.7–5.3)
Sodium: 140 mEq/L (ref 137–147)
Total Bilirubin: 0.7 mg/dL (ref 0.3–1.2)
Total Protein: 5.7 g/dL — ABNORMAL LOW (ref 6.0–8.3)

## 2013-08-01 LAB — GLUCOSE, CAPILLARY
Glucose-Capillary: 105 mg/dL — ABNORMAL HIGH (ref 70–99)
Glucose-Capillary: 114 mg/dL — ABNORMAL HIGH (ref 70–99)

## 2013-08-01 LAB — CBC
HCT: 31.7 % — ABNORMAL LOW (ref 39.0–52.0)
HEMOGLOBIN: 10.3 g/dL — AB (ref 13.0–17.0)
MCH: 31.8 pg (ref 26.0–34.0)
MCHC: 32.5 g/dL (ref 30.0–36.0)
MCV: 97.8 fL (ref 78.0–100.0)
Platelets: 202 10*3/uL (ref 150–400)
RBC: 3.24 MIL/uL — AB (ref 4.22–5.81)
RDW: 19.9 % — ABNORMAL HIGH (ref 11.5–15.5)
WBC: 6.1 10*3/uL (ref 4.0–10.5)

## 2013-08-01 NOTE — Progress Notes (Signed)
Nutrition Brief Note  Patient identified on the Malnutrition Screening Tool (MST) Report.  Patient reports he lost weight when he started receiving chemotherapy in January 2015, however, he's gained most of it back.   Wt Readings from Last 15 Encounters:  08/01/13 210 lb 1.6 oz (95.3 kg)  08/01/13 210 lb 1.6 oz (95.3 kg)  07/29/13 211 lb 4.8 oz (95.845 kg)  07/26/13 211 lb 3.2 oz (95.8 kg)  07/25/13 212 lb (96.163 kg)  07/25/13 205 lb (92.987 kg)  07/04/13 205 lb (92.987 kg)  06/21/13 207 lb 9.6 oz (94.167 kg)  06/19/13 211 lb 1.6 oz (95.754 kg)  06/13/13 210 lb 8 oz (95.482 kg)  06/07/13 212 lb 11.2 oz (96.48 kg)  06/06/13 209 lb (94.802 kg)  05/31/13 209 lb 14.4 oz (95.21 kg)  05/27/13 212 lb 4.8 oz (96.299 kg)  05/24/13 214 lb (97.07 kg)    Body mass index is 27.73 kg/(m^2). Patient meets criteria for Overweight based on current BMI.   Current diet order is Regular.  Reports his appetite is good.  Labs and medications reviewed.   No nutrition interventions warranted at this time. If nutrition issues arise, please consult RD.   Arthur Holms, RD, LDN Pager #: [phone] After-Hours Pager #: [phone]

## 2013-08-01 NOTE — Op Note (Signed)
NAMEAMED, DATTA NO.:  [id number]  MEDICAL RECORD NO.:  [id number]  LOCATION:  2S16C                        FACILITY:  [facility]  PHYSICIAN:  Lanelle Bal, MD    DATE OF BIRTH:  June 19, 1940  DATE OF PROCEDURE:  07/30/2013 DATE OF DISCHARGE:                              OPERATIVE REPORT   PREOPERATIVE DIAGNOSIS:  Non-small cell lung cancer, right upper lobe with chest wall invasion.  POSTOPERATIVE DIAGNOSIS:  Non-small cell lung cancer, right upper lobe with chest wall invasion.  SURGICAL PROCEDURE:  Bronchoscopy, mediastinoscopy, right posterior lateral thoracotomy with right upper lobectomy, lymph node dissection, and posterior approach to en bloc chest wall resection, ribs 2, 3, and 4.  SURGEON:  Lanelle Bal, MD  FIRST ASSISTANT:  Lars Pinks, PA  BRIEF HISTORY:  The patient is a 73 year old male who presented in February with right shoulder and arm pain, and was found to have a right upper lobe lung mass with chest wall invasion at the superior sulcus. He was treated with chemotherapy and preoperative radiation and now presents for surgical resection.  Pulmonary function studies were adequate for lobectomy.  Follow up CT scan showed response to preoperative treatment with decrease size of the mass.  The patient's pain had markedly improved.  Risks and options of surgery were discussed with the patient in detail.  He was agreeable and signed informed consent.  DESCRIPTION OF PROCEDURE:  The patient underwent general endotracheal anesthesia with a single-lumen endotracheal tube.  Appropriate time-out was performed and through this tube, a fiberoptic bronchoscopy was performed.  The left tracheobronchial tree appeared normal.  In the right tracheobronchial tree, the takeoff of the upper lobe bronchus was free of disease in the posterior segment bronchus.  There was area of endobronchial lesion, this was biopsied and definitive diagnosis  could not be made, but frozen suggested necrotic tumor.  The scope was removed.  His neck was prepped with Betadine and draped in sterile manner.  Neck and chest was draped in sterile manner.  A small incision was made in the suprasternal notch and dissection carried down to the pretracheal fascia.  A video mediastinal scope was introduced into the pretracheal space and dissection carried down along the trachea.  4R lymph nodes were identified and biopsied.  Frozen section on these were benign.  The scope was removed.  There was no evidence of mediastinal bleeding.  The incision was then closed in the fascial layer with interrupted 3-0 Vicryl and a 4-0 subcuticular stitch in skin edges. Dermabond was applied.  We then proceeded with planned right upper lobectomy with chest wall resection.  The patient was turned in the lateral decubitus position with right side up.  Preop markings were identified and secondary time-out was performed.  The right chest was prepped with Betadine and draped in usual sterile manner.  A small incision was made in the anterior axillary line, and through this, a video scope was introduced.  Exploration of the chest revealed no evidence of diffuse pleural implants.  The right upper lobe was adherent to the chest wall as outlined by preoperative studies.  The scope was removed.  We then proceeded with  a posterolateral thoracotomy extending the most medial portion of the incision between the medial scapula and spine in order to gain exposure for rib resection.  The fourth intercostal space was entered, slightly on palpation, the location of the chest wall mass was identified.  We then proceeded with dividing the anterior portion of the ribs and chest wall for second, third, and fourth ribs.  This put [place] superior to the tumor with the anterior portion of the ribs divided.  The retractor was placed back into the chest and we proceeded with formal right upper  lobectomy.  The upper lobe pulmonary vein was easily dissected free and stapled with a vascular stapler.  Four pulmonary artery branches to the upper lobe were identified and dissected and encircled and stapled with vascular stapler.  The major fissure was complete.  The right upper lobe bronchus was encircled.  A black load stapler was used to cross the bronchus, prior to stapling this, the right lung was inflated, middle and lower lobes inflated properly.  The bronchus was divided.  The minor fissure was then completed with a series of stapling with the 60 purple stapler. With the right upper lobe freed from the bronchial and vascular attachments, we then proceeded with completion of en bloc resection of the chest wall proceeding posteriorly.  The rhomboids have been divided. Attachments of the serratus were taken off the rib and using an osteotome, the posterior ribs were divided taking care to ligate each neurovascular bundle with each rib.  We moved superiorly and after dividing the posterior attachment of the second, third, and fourth ribs, the specimen was removed.  Grossly the tumor was removed from the chest wall.  The lymph node dissection was then carried out removing 10R and 11R lymph nodes in addition to the previous biopsy of the 4R lymph node. The bronchial stump was tested for air leak.  With inflation of the middle and lower lobes, two 28 chest tubes were left in place.  A [place] patch was sewed and attached to the chest wall, attachments to fill in the tap left about the resection which was primarily behind the scapula.  Each layer of muscles were then carefully reapproximated with 0 interrupted Vicryl sutures.  Subcutaneous tissue was closed with running 2-0 Vicryl, and skin staples on the skin edges.  The middle and lower lobes inflated nicely.  The sponge and needle count was reported as correct at completion of the procedure.  The patient tolerated the procedure  without obvious complication.  Estimated blood loss approximately 800 mL.  The patient did not require any blood transfusion during the case.  He was awakened and extubated in the operating room and transferred to the recovery room for postoperative care.     Lanelle Bal, MD     EG/MEDQ  D:  08/01/2013  T:  08/01/2013  Job:  [id number]

## 2013-08-01 NOTE — Progress Notes (Addendum)
      [address]       [place] [address]             [phone]      2 Days Post-Op Procedure(s) (LRB): VIDEO BRONCHOSCOPY (N/A) MEDIASTINOSCOPY (N/A) VIDEO ASSISTED THORACOSCOPY (Right) THORACOTOMY FOR CHEST WALL RESECTION, RIGHT UPPER LOBECTOMY  Subjective:  Terry Robles is feeling better this morning.  He is having some pain, but it has improved with increase in PCA and Toradol.  Objective: Vital signs in last 24 hours: Temp:  [97.5 F (36.4 C)-98 F (36.7 C)] 98 F (36.7 C) (05/07 0713) Pulse Rate:  [51-95] 57 (05/07 0700) Cardiac Rhythm:  [-] Sinus bradycardia (05/07 0600) Resp:  [11-22] 18 (05/07 0700) BP: (86-145)/(43-77) 135/57 mmHg (05/07 0700) SpO2:  [89 %-99 %] 98 % (05/07 0700) Arterial Line BP: (116)/(44) 116/44 mmHg (05/06 0900) Weight:  [210 lb 1.6 oz (95.3 kg)] 210 lb 1.6 oz (95.3 kg) (05/07 0500)  Intake/Output from previous day: 05/06 0701 - 05/07 0700 In: 2067 [P.O.:240; I.V.:1797] Out: 2100 [Urine:1720; Chest Tube:380]  General appearance: alert, cooperative and no distress Heart: regular rate and rhythm Lungs: clear to auscultation bilaterally Abdomen: soft, non-tender; bowel sounds normal; no masses,  no organomegaly Wound: clean and dry  Lab Results:  Recent Labs  07/31/13 0430 08/01/13 0400  WBC 7.5 6.1  HGB 11.2* 10.3*  HCT 33.1* 31.7*  PLT 215 202   BMET:  Recent Labs  07/31/13 0430 08/01/13 0400  NA 139 140  K 3.8 4.0  CL 105 106  CO2 24 24  GLUCOSE 131* 110*  BUN 11 10  CREATININE 0.65 0.71  CALCIUM 8.2* 8.5    PT/INR: No results found for this basename: LABPROT, INR,  in the last 72 hours ABG    Component Value Date/Time   PHART 7.427 07/31/2013 0432   HCO3 23.4 07/31/2013 0432   TCO2 24 07/31/2013 0432   ACIDBASEDEF 1.0 07/31/2013 0432   O2SAT 86.0 07/31/2013 0432   CBG (last 3)   Recent Labs  07/31/13 1123 07/31/13 1753 07/31/13 2346  GLUCAP 120* 119* 115*    Assessment/Plan: S/P  Procedure(s) (LRB): VIDEO BRONCHOSCOPY (N/A) MEDIASTINOSCOPY (N/A) VIDEO ASSISTED THORACOSCOPY (Right) THORACOTOMY FOR CHEST WALL RESECTION, RIGHT UPPER LOBECTOMY  1. Chest tube- no air leak appreciated this morning, 390 cc output yesterday, CXR remains stable- possibly place chest tube to water seal today 2. Pulm- not on oxygen, encourage use of IS 3. Pain control- good relief with increase in PCA dose, Toradol 4. BPH- will remove foley today, continue Flomax 5. Decrease IV fluids to KVO 6. Dispo- patient doing much better today, will d/c foley, CXR stable, likely transition chest tube to water seal today, possibly transfer to 3300   LOS: 2 days    Erin Barrett 08/01/2013  No air leak Ct to water seal increase walking I have seen and examined Terry Robles and agree with the above assessment  and plan.  Grace Isaac MD Beeper [phone] Office [phone] 08/01/2013 8:28 AM

## 2013-08-02 ENCOUNTER — Inpatient Hospital Stay (HOSPITAL_COMMUNITY): Payer: Medicare Other

## 2013-08-02 ENCOUNTER — Encounter (HOSPITAL_COMMUNITY): Payer: Self-pay | Admitting: Cardiothoracic Surgery

## 2013-08-02 LAB — GLUCOSE, CAPILLARY
Glucose-Capillary: 108 mg/dL — ABNORMAL HIGH (ref 70–99)
Glucose-Capillary: 107 mg/dL — ABNORMAL HIGH (ref 70–99)
Glucose-Capillary: 114 mg/dL — ABNORMAL HIGH (ref 70–99)
Glucose-Capillary: 116 mg/dL — ABNORMAL HIGH (ref 70–99)

## 2013-08-02 LAB — BASIC METABOLIC PANEL
BUN: 8 mg/dL (ref 6–23)
CO2: 24 mEq/L (ref 19–32)
Calcium: 9.2 mg/dL (ref 8.4–10.5)
Chloride: 104 mEq/L (ref 96–112)
Creatinine, Ser: 0.71 mg/dL (ref 0.50–1.35)
GFR calc Af Amer: >90 mL/min (ref >90)
GFR calc non Af Amer: >90 mL/min (ref >90)
Glucose, Bld: 104 mg/dL — ABNORMAL HIGH (ref 70–99)
Potassium: 3.8 mEq/L (ref 3.7–5.3)
Sodium: 142 mEq/L (ref 137–147)

## 2013-08-02 LAB — CBC
HCT: 36.1 % — ABNORMAL LOW (ref 39.0–52.0)
Hemoglobin: 11.9 g/dL — ABNORMAL LOW (ref 13.0–17.0)
MCH: 31.9 pg (ref 26.0–34.0)
MCHC: 33 g/dL (ref 30.0–36.0)
MCV: 96.8 fL (ref 78.0–100.0)
Platelets: 261 10*3/uL (ref 150–400)
RBC: 3.73 MIL/uL — ABNORMAL LOW (ref 4.22–5.81)
RDW: 18.9 % — ABNORMAL HIGH (ref 11.5–15.5)
WBC: 7.6 10*3/uL (ref 4.0–10.5)

## 2013-08-02 MED ORDER — LEVALBUTEROL HCL 0.63 MG/3ML IN NEBU: 0.6300 mg | INHALATION_SOLUTION | Freq: Four times a day (QID) | RESPIRATORY_TRACT | Status: DC | PRN

## 2013-08-02 NOTE — Progress Notes (Signed)
Pt TX from 2S, VSS, will continue to monitor.

## 2013-08-02 NOTE — Progress Notes (Signed)
Pt transferred to 3S05. Pt ambulated distance to new room, tolerated well. Pt transferred on portable tele, room air. Pt wife present for transfer, pt belongings sent with pt to new room. Receiving RN, Burman Nieves, present on arrival to new room. Pt placed on receiving units tele. [place] notified of pt transfer. Terry Robles

## 2013-08-02 NOTE — Progress Notes (Signed)
[facility] DAILY ICU PROGRESS NOTE                   [address]            [place] [address]          [phone]   3 Days Post-Op Procedure(s) (LRB): VIDEO BRONCHOSCOPY (N/A) MEDIASTINOSCOPY (N/A) VIDEO ASSISTED THORACOSCOPY (Right) THORACOTOMY FOR CHEST WALL RESECTION, RIGHT UPPER LOBECTOMY  Total Length of Stay:  LOS: 3 days   Subjective: Better pain control  Objective: Vital signs in last 24 hours: Temp:  [97.5 F (36.4 C)-97.8 F (36.6 C)] 97.8 F (36.6 C) (05/08 0400) Pulse Rate:  [53-80] 69 (05/08 0800) Cardiac Rhythm:  [-] Normal sinus rhythm (05/08 0800) Resp:  [13-27] 17 (05/08 0910) BP: (100-145)/(46-81) 140/56 mmHg (05/08 0910) SpO2:  [95 %-100 %] 100 % (05/08 0800) Weight:  [209 lb 3.5 oz (94.9 kg)] 209 lb 3.5 oz (94.9 kg) (05/08 0400)  Filed Weights   07/31/13 0600 08/01/13 0500 08/02/13 0400  Weight: 209 lb (94.802 kg) 210 lb 1.6 oz (95.3 kg) 209 lb 3.5 oz (94.9 kg)    Weight change: -14.1 oz (-0.4 kg)   Hemodynamic parameters for last 24 hours:    Intake/Output from previous day: 05/07 0701 - 05/08 0700 In: 693.1 [P.O.:180; I.V.:513.1] Out: 3175 [Urine:2625; Chest Tube:550]  Intake/Output this shift: Total I/O In: 43 [I.V.:43] Out: 30 [Chest Tube:30]  Current Meds: Scheduled Meds: . acetaminophen  1,000 mg Oral 4 times per day   Or  . acetaminophen (TYLENOL) oral liquid 160 mg/5 mL  1,000 mg Oral 4 times per day  . bisacodyl  10 mg Oral Daily  . cycloSPORINE  1 drop Both Eyes BID  . enoxaparin (LOVENOX) injection  30 mg Subcutaneous Q12H  . fentaNYL   Intravenous 6 times per day  . insulin aspart  0-24 Units Subcutaneous 4 times per day  . levalbuterol  0.63 mg Nebulization Q8H  . multivitamin with minerals  1 tablet Oral Daily  . mupirocin cream   Topical BID  . pantoprazole  40 mg Oral Daily  . senna-docusate  1 tablet Oral QHS  . sodium chloride  10 mL Intravenous Q12H  . tamsulosin  0.4 mg Oral QPC supper    Continuous Infusions: . dextrose 5 % and 0.9 % NaCl with KCl 20 mEq/L 20 mL/hr at 08/02/13 0400   PRN Meds:.diphenhydrAMINE, diphenhydrAMINE, ketorolac, naloxone, ondansetron (ZOFRAN) IV, ondansetron (ZOFRAN) IV, oxyCODONE, potassium chloride, sodium chloride, sodium chloride, traMADol  General appearance: alert and cooperative Neurologic: intact Heart: regular rate and rhythm, S1, S2 normal, no murmur, click, rub or gallop Lungs: diminished breath sounds RLL Abdomen: soft, non-tender; bowel sounds normal; no masses,  no organomegaly Extremities: extremities normal, atraumatic, no cyanosis or edema and Homans sign is negative, no sign of DVT Wound: intact, very small air leak with cough  Lab Results: CBC: Recent Labs  08/01/13 0400 08/02/13 0430  WBC 6.1 7.6  HGB 10.3* 11.9*  HCT 31.7* 36.1*  PLT 202 261   BMET:  Recent Labs  08/01/13 0400 08/02/13 0430  NA 140 142  K 4.0 3.8  CL 106 104  CO2 24 24  GLUCOSE 110* 104*  BUN 10 8  CREATININE 0.71 0.71  CALCIUM 8.5 9.2    PT/INR: No results found for this basename: LABPROT, INR,  in the last 72 hours Radiology: Dg Chest Port 1 View  08/02/2013   CLINICAL DATA:  Chest tubes  EXAM: PORTABLE CHEST - 1 VIEW  COMPARISON:  DG CHEST 1V PORT dated 08/01/2013; DG CHEST 1V PORT dated 07/30/2013; DG CHEST 1 VIEW dated 04/30/2013; CT CHEST W/CM dated 07/24/2013  FINDINGS: Grossly unchanged borderline enlarged cardiac silhouette and mediastinal contours with atherosclerotic plaque within the thoracic aorta. Stable postsurgical change of the right hilum with associated right-sided volume loss and mild deviation of the cardiomediastinal structures to the right. Stable positioning of support apparatus. No definite pneumothorax. The amount of right lateral chest wall subcutaneous emphysema is grossly unchanged. Grossly unchanged bibasilar heterogeneous opacities, left greater than right. No new focal airspace opacities. No evidence of edema. No  definite pleural effusion. Unchanged bones.  IMPRESSION: 1.  Stable positioning of support apparatus.  No pneumothorax. 2. Stable postsurgical change of the right lung without superimposed acute cardiopulmonary disease.   Electronically Signed   By: Sandi Mariscal M.D.   On: 08/02/2013 08:02   Dg Chest Port 1 View  08/01/2013   CLINICAL DATA:  Status post video-assisted thoracoscopy.  EXAM: PORTABLE CHEST - 1 VIEW  COMPARISON:  07/31/2013.  FINDINGS: There are 2 right chest tubes in good position. There is no pneumothorax. Postsurgical changes along the right chest wall are re- demonstrated. Right IJ catheter good position. Stable cardiomediastinal silhouette. Stable mild vascular congestion.  IMPRESSION: Stable chest.   Electronically Signed   By: Rolla Flatten M.D.   On: 08/01/2013 08:01     Assessment/Plan: S/P Procedure(s) (LRB): VIDEO BRONCHOSCOPY (N/A) MEDIASTINOSCOPY (N/A) VIDEO ASSISTED THORACOSCOPY (Right) THORACOTOMY FOR CHEST WALL RESECTION, RIGHT UPPER LOBECTOMY Mobilize Diuresis Plan for transfer to step-down: see transfer orders D/c posterior chest tube    Terry Robles 08/02/2013 10:34 AM

## 2013-08-02 NOTE — Progress Notes (Signed)
RT Note: Pt states he does not feel he needs breathing treatment at this time, SPO2 100% on RA, BBS clear. RT changed to PRN, pt states he will call if needs treatment.

## 2013-08-03 ENCOUNTER — Inpatient Hospital Stay (HOSPITAL_COMMUNITY): Payer: Medicare Other

## 2013-08-03 LAB — BASIC METABOLIC PANEL
BUN: 12 mg/dL (ref 6–23)
CO2: 24 mEq/L (ref 19–32)
Calcium: 8.5 mg/dL (ref 8.4–10.5)
Chloride: 105 mEq/L (ref 96–112)
Creatinine, Ser: 0.68 mg/dL (ref 0.50–1.35)
GFR calc Af Amer: >90 mL/min (ref >90)
GFR calc non Af Amer: >90 mL/min (ref >90)
Glucose, Bld: 94 mg/dL (ref 70–99)
Potassium: 3.5 mEq/L — ABNORMAL LOW (ref 3.7–5.3)
Sodium: 140 mEq/L (ref 137–147)

## 2013-08-03 LAB — GLUCOSE, CAPILLARY
Glucose-Capillary: 109 mg/dL — ABNORMAL HIGH (ref 70–99)
Glucose-Capillary: 96 mg/dL (ref 70–99)

## 2013-08-03 LAB — CBC
HCT: 32.8 % — ABNORMAL LOW (ref 39.0–52.0)
Hemoglobin: 10.9 g/dL — ABNORMAL LOW (ref 13.0–17.0)
MCH: 32.2 pg (ref 26.0–34.0)
MCHC: 33.2 g/dL (ref 30.0–36.0)
MCV: 97 fL (ref 78.0–100.0)
Platelets: 271 10*3/uL (ref 150–400)
RBC: 3.38 MIL/uL — ABNORMAL LOW (ref 4.22–5.81)
RDW: 18.8 % — ABNORMAL HIGH (ref 11.5–15.5)
WBC: 5.8 10*3/uL (ref 4.0–10.5)

## 2013-08-03 NOTE — Progress Notes (Signed)
D/C remaining CT. Pt tolerated; no SOB. 2 view ordered for am. Will continue to monitor.

## 2013-08-03 NOTE — Progress Notes (Addendum)
       [address]       [place] [address]             [phone]          4 Days Post-Op Procedure(s) (LRB): VIDEO BRONCHOSCOPY (N/A) MEDIASTINOSCOPY (N/A) VIDEO ASSISTED THORACOSCOPY (Right) THORACOTOMY FOR CHEST WALL RESECTION, RIGHT UPPER LOBECTOMY  Subjective: Just took a long walk in the hall.  Pain well controlled, breathing stable.   Objective: Vital signs in last 24 hours: Patient Vitals for the past 24 hrs:  BP Temp Temp src Pulse Resp SpO2 Height  08/03/13 0343 124/70 mmHg 97.6 F (36.4 C) Oral 65 14 97 % -  08/02/13 2359 143/66 mmHg 98.1 F (36.7 C) Oral 62 20 99 % -  08/02/13 2032 - 98 F (36.7 C) Oral - - - -  08/02/13 1927 124/66 mmHg - - 67 17 100 % -  08/02/13 1500 - - - 75 23 94 % -  08/02/13 1300 - - - 64 19 100 % -  08/02/13 1200 106/51 mmHg - - 67 20 97 % -  08/02/13 1155 - 97.8 F (36.6 C) Oral - - - -  08/02/13 1100 118/53 mmHg - - 74 18 95 % -  08/02/13 1015 130/59 mmHg - - 67 19 98 % -  08/02/13 1000 - - - 73 26 100 % -  08/02/13 0910 140/56 mmHg - - - 17 - 6\' 1"  (1.854 m)  08/02/13 0900 - - - - 27 - -  08/02/13 0800 123/65 mmHg - - 69 22 100 % -   Current Weight  08/02/13 209 lb 3.5 oz (94.9 kg)     Intake/Output from previous day: 05/08 0701 - 05/09 0700 In: 982 [P.O.:600; I.V.:382] Out: 810 [Urine:700; Chest Tube:60]  CBGs [phone]   PHYSICAL EXAM:  Heart: RRR Lungs: Slightly decreased BS in bases Wound: Clean and dry Chest tube: No air leak    Lab Results: CBC: Recent Labs  08/02/13 0430 08/03/13 0430  WBC 7.6 5.8  HGB 11.9* 10.9*  HCT 36.1* 32.8*  PLT 261 271   BMET:  Recent Labs  08/02/13 0430 08/03/13 0430  NA 142 140  K 3.8 3.5*  CL 104 105  CO2 24 24  GLUCOSE 104* 94  BUN 8 12  CREATININE 0.71 0.68  CALCIUM 9.2 8.5    PT/INR: No results found for this basename: LABPROT, INR,  in the last 72 hours  CXR: stable , no obvious ptx   Assessment/Plan: S/P Procedure(s)  (LRB): VIDEO BRONCHOSCOPY (N/A) MEDIASTINOSCOPY (N/A) VIDEO ASSISTED THORACOSCOPY (Right) THORACOTOMY FOR CHEST WALL RESECTION, RIGHT UPPER LOBECTOMY Doing well, pain controlled with PCA and Toradol. CT with no air leak, minimal output.  CXR stable.  Hopefully can d/c CT soon. Continue ambulation, pulm toilet.   LOS: 4 days    Coolidge Breeze 08/03/2013   Chart reviewed, patient examined, agree with above. CXR looks good and CT with minimal output. Will remove it.

## 2013-08-04 ENCOUNTER — Inpatient Hospital Stay (HOSPITAL_COMMUNITY): Payer: Medicare Other

## 2013-08-04 NOTE — Progress Notes (Addendum)
       [address]       [place] [address]             [phone]          5 Days Post-Op Procedure(s) (LRB): VIDEO BRONCHOSCOPY (N/A) MEDIASTINOSCOPY (N/A) VIDEO ASSISTED THORACOSCOPY (Right) THORACOTOMY FOR CHEST WALL RESECTION, RIGHT UPPER LOBECTOMY  Subjective: Having a lot of pain this am after going to radiology.  Breathing stable.   Objective: Vital signs in last 24 hours: Patient Vitals for the past 24 hrs:  BP Temp Temp src Pulse Resp SpO2  08/04/13 0730 136/83 mmHg 97.9 F (36.6 C) Oral 73 24 97 %  08/04/13 0326 133/66 mmHg 97.9 F (36.6 C) Oral 64 11 95 %  08/03/13 2306 118/70 mmHg 97.7 F (36.5 C) Oral 70 22 96 %  08/03/13 2300 - - - 63 18 97 %  08/03/13 2044 146/58 mmHg 97.6 F (36.4 C) Oral 67 14 98 %  08/03/13 1513 129/64 mmHg 98 F (36.7 C) - 72 25 97 %  08/03/13 1100 130/67 mmHg 97.9 F (36.6 C) Oral - - -   Current Weight  08/02/13 209 lb 3.5 oz (94.9 kg)     Intake/Output from previous day: 05/09 0701 - 05/10 0700 In: 600 [P.O.:600] Out: 1225 [Urine:1225]    PHYSICAL EXAM:  Heart: RRR Lungs: Clear Wound:Clean and dry    Lab Results: CBC: Recent Labs  08/02/13 0430 08/03/13 0430  WBC 7.6 5.8  HGB 11.9* 10.9*  HCT 36.1* 32.8*  PLT 261 271   BMET:  Recent Labs  08/02/13 0430 08/03/13 0430  NA 142 140  K 3.8 3.5*  CL 104 105  CO2 24 24  GLUCOSE 104* 94  BUN 8 12  CREATININE 0.71 0.68  CALCIUM 9.2 8.5    PT/INR: No results found for this basename: LABPROT, INR,  in the last 72 hours  CXR: stable, no obvious ptx  Assessment/Plan: S/P Procedure(s) (LRB): VIDEO BRONCHOSCOPY (N/A) MEDIASTINOSCOPY (N/A) VIDEO ASSISTED THORACOSCOPY (Right) THORACOTOMY FOR CHEST WALL RESECTION, RIGHT UPPER LOBECTOMY Will try to get pain better controlled today and mobilize. Hopefully home in the next 1-2 days if he remains stable.   LOS: 5 days    Coolidge Breeze 08/04/2013   Chart reviewed, patient  examined, agree with above. CXR is ok. Continue ambulation and IS.

## 2013-08-04 NOTE — Discharge Summary (Signed)
[address]       [place] [address]             [phone]              Discharge Summary  Name: Terry Robles DOB: 1940/06/04 73 y.o. MRN: [id number]   Admission Date: 07/30/2013 Discharge Date: 08/06/2013    Admitting Diagnosis: Squamous cell carcinoma, right upper lobe, with chest wall involvement (Pancoast tumor)   Discharge Diagnosis:  Squamous cell carcinoma, right upper lobe, with chest wall involvement (Pancoast tumor)  Past Medical History  Diagnosis Date  . Hypercholesteremia 05/14/2011  . BPH (benign prostatic hyperplasia) 05/14/2011  . BPH (benign prostatic hyperplasia)   . Shortness of breath     with exertion  . Wears glasses   . Pneumonia   . GERD (gastroesophageal reflux disease)   . Headache(784.0)   . Cancer     skin cancer squamous on left arm      Procedures: VIDEO BRONCHOSCOPY  - 07/30/2013 MEDIASTINOSCOPY RIGHT THORACOTOMY  CHEST WALL RESECTION INCLUDING RIBS 2,3, and 4   RIGHT UPPER LOBECTOMY     HPI:  The patient is a 73 y.o. male with a history of smoking who complains of cough which has been present for more than 6 months.  4 weeks ago, his cough was associated with hemoptysis. His primary care physician, Dr. Iona Beard ordered a CT scan of the chest which was performed on 04/12/2013 and showed an  8.5 x 6.7 x 8.7 CM right Pancoast tumor invading the chest with bony destruction of the right second and third ribs along the tumor margin. There was confluent adenopathy related to the mass present in the right suprahilar and hilar regions. 1 lymph node measured 1.6 CM in short axis and a lower right peritracheal node measured 0.8 CM in short axis. The tumor and adjacent adenopathy cause extrinsic narrowing of the right upper lobe pulmonary artery. A PET scan was performed in 04/25/2013. It showed a large hypermetabolic right upper lobe mass consistent with primary bronchogenic carcinoma. There is hypermetabolic right  hilar metastatic lymph nodes and mild metabolic activity of right lower paratracheal lymph node concerning for metastases but indeterminate.   On 04/30/2013, the patient underwent CT-guided core biopsy of the right upper lobe lung lesion by interventional radiology.  The final pathology (Accession: [id number]) of the right upper lobe biopsy was consistent with non-small cell carcinoma. The core biopsies are extensively involved by non small cell carcinoma associated with large areas of necrosis. The morphologic features favor squamous cell carcinoma. Immunohistochemistry was performed and the tumor is positive with Cytokeratin 5/6, Cytokeratin 903 and p63. The tumor is negative with Cytokeratin 7, Napsin A, Thyroid Transcription Factor -1 (TTF-1) and Cytokeratin 20.  The immunohistochemical findings are consistent with the morphologic impression of squamous cell carcinoma.   The patient was seen by Dr. Julien Nordmann, and has undergone concurrent chemoradiation with weekly carboplatin for an AUC of 2 and paclitaxel 45 mg per meter squared. Patient completed radiation two weeks ago.  He was referred to Dr. Servando Snare for consideration of surgical resection.  Follow up CT scan revealed good response to chemoradiation, with decrease in the size of the mass and lymph nodes.  It was felt that he should proceed bronchoscopy, ebus, mediastinoscopy, right video-assisted thoracoscopy lung resection and chest wall resection. If the mediastinal lymph nodes are positive on initial exam we will not proceed with resection but continue with current chemoradiation, if  with ebus and or mediastinoscopy the mediastinal nodes are negative, we will proceed at the same setting with resection. All risks, benefits and alternatives of surgery were explained in detail, and the patient agreed to proceed.      Hospital Course:  The patient was admitted to [facility] on 07/30/2013. The patient was taken to the operating room and underwent the  above procedure.    The postoperative course was notable initially for some pain control issues, but he was started on a PCA and Toradol with relief. The PCA was ultimately weaned and discontinued, and presently his pain is well controlled with po meds.  Chest tubes have been removed in the standard fashion, and follow up chest x-rays have been stable.  He is ambulating in the halls without difficulty and tolerating a regular diet. Incisions are healing well.   He has been weaned from supplemental oxygen. The patient is overall doing well and is currently medically stable for discharge on today's date.     Recent vital signs:  Filed Vitals:   08/04/13 0730  BP: 136/83  Pulse: 73  Temp: 97.9 F (36.6 C)  Resp: 24    Recent laboratory studies:  CBC: Recent Labs  08/02/13 0430 08/03/13 0430  WBC 7.6 5.8  HGB 11.9* 10.9*  HCT 36.1* 32.8*  PLT 261 271   BMET:  Recent Labs  08/02/13 0430 08/03/13 0430  NA 142 140  K 3.8 3.5*  CL 104 105  CO2 24 24  GLUCOSE 104* 94  BUN 8 12  CREATININE 0.71 0.68  CALCIUM 9.2 8.5    PT/INR: No results found for this basename: LABPROT, INR,  in the last 72 hours   Discharge Medications:     Medication List    STOP taking these medications       oxyCODONE-acetaminophen 5-325 MG per tablet  Commonly known as:  PERCOCET/ROXICET      TAKE these medications       cycloSPORINE 0.05 % ophthalmic emulsion  Commonly known as:  RESTASIS  1 drop 2 (two) times daily.     docusate sodium 100 MG capsule  Commonly known as:  COLACE  Take 100 mg by mouth daily.     multivitamin with minerals Tabs tablet  Take 1 tablet by mouth daily.     oxyCODONE 5 MG immediate release tablet  Commonly known as:  Oxy IR/ROXICODONE  Take 1-2 tablets (5-10 mg total) by mouth every 4 (four) hours as needed for severe pain.     tamsulosin 0.4 MG Caps capsule  Commonly known as:  FLOMAX  Take 0.4 mg by mouth daily after supper.     traMADol 50 MG  tablet  Commonly known as:  ULTRAM  Take 1-2 tablets (50-100 mg total) by mouth every 6 (six) hours as needed for moderate pain (mild pain).         Discharge Instructions:  The patient is to refrain from driving, heavy lifting or strenuous activity.  May shower daily and clean incisions with soap and water.  May resume regular diet.   Follow Up:       Future Appointments Provider Department Dept Phone   10/24/2013 8:30 AM Lora Paula, MD [facility] Radiation Oncology [phone]     Follow-up Information   Follow up with GERHARDT,EDWARD B, MD. (Office will contact you with an appointment)    Specialty:  Cardiothoracic Surgery   Contact information:   [address]  [place] [address] [phone]       Follow up with [facility] [place] NURSE In 1 week. (For staple removal - office will schedule)       Follow up with [name]., MD. (As directed)    Specialty:  Oncology   Contact information:   [place] [address] [place] 08/04/2013, 12:03 PM

## 2013-08-05 ENCOUNTER — Inpatient Hospital Stay (HOSPITAL_COMMUNITY): Payer: Medicare Other

## 2013-08-05 MED ORDER — TRAMADOL HCL 50 MG PO TABS: 50.0000 mg | ORAL_TABLET | Freq: Four times a day (QID) | ORAL | Status: DC | PRN

## 2013-08-05 MED ORDER — OXYCODONE HCL 5 MG PO TABS: 5.0000 mg | ORAL_TABLET | ORAL | Status: DC | PRN

## 2013-08-05 MED ORDER — BOOST / RESOURCE BREEZE PO LIQD
237.0000 mL | Freq: Three times a day (TID) | ORAL | Status: DC
  Administered 2013-08-05 – 2013-08-06 (×2): 1 via ORAL

## 2013-08-05 MED ORDER — LACTULOSE 10 GM/15ML PO SOLN
20.0000 g | Freq: Once | ORAL | Status: AC
  Administered 2013-08-05: 20 g via ORAL
  Filled 2013-08-05: qty 30

## 2013-08-05 MED ORDER — NYSTATIN 100000 UNIT/ML MT SUSP
5.0000 mL | Freq: Three times a day (TID) | OROMUCOSAL | Status: DC
  Administered 2013-08-05 – 2013-08-06 (×4): 500000 [IU] via OROMUCOSAL
  Filled 2013-08-05 (×6): qty 5

## 2013-08-05 NOTE — Progress Notes (Addendum)
      [address]       [place] [address]             [phone]       6 Days Post-Op Procedure(s) (LRB): VIDEO BRONCHOSCOPY (N/A) MEDIASTINOSCOPY (N/A) VIDEO ASSISTED THORACOSCOPY (Right) THORACOTOMY FOR CHEST WALL RESECTION, RIGHT UPPER LOBECTOMY  Subjective: Patient with sore throat. Pain is slightly improved, but still not well controlled.  Objective: Vital signs in last 24 hours: Temp:  [97.8 F (36.6 C)-98.1 F (36.7 C)] 98.1 F (36.7 C) (05/11 0335) Pulse Rate:  [67-82] 82 (05/11 0335) Cardiac Rhythm:  [-] Normal sinus rhythm (05/10 2017) Resp:  [14-24] 21 (05/11 0335) BP: (125-136)/(68-83) 134/68 mmHg (05/11 0335) SpO2:  [94 %-97 %] 95 % (05/11 0335)   Hemodynamic parameters for last 24 hours:    Intake/Output from previous day: 05/10 0701 - 05/11 0700 In: 720 [P.O.:720] Out: 1250 [Urine:1250]   Physical Exam:  Cardiovascular: RRR Pulmonary: Clear to auscultation bilaterally; no rales, wheezes, or rhonchi. Abdomen: Soft, non tender, bowel sounds present. Wounds: Clean and dry.  No erythema or signs of infection.   Lab Results: CBC: Recent Labs  08/03/13 0430  WBC 5.8  HGB 10.9*  HCT 32.8*  PLT 271   BMET:  Recent Labs  08/03/13 0430  NA 140  K 3.5*  CL 105  CO2 24  GLUCOSE 94  BUN 12  CREATININE 0.68  CALCIUM 8.5    PT/INR: No results found for this basename: LABPROT, INR,  in the last 72 hours ABG:  INR: Will add last result for INR, ABG once components are confirmed Will add last 4 CBG results once components are confirmed  Assessment/Plan:  1. CV - SR 2.  Pulmonary - CXR appears to show small subcutaneous emphysema on the right, questionable trace right apical ptx. Encourage incentive spirometer 3. Appears to be getting thrush-Nystatin 4.GI-emesis after dinner last evening. He does not have much appetite. Denies abdominal pain, nausea this am. LOC constipation as has not had a bowel movement in a few  days. 5.Remove central line 6.Resource tid 7.Likely discharge in am  [place] 08/05/2013,7:24 AM  Home in am if no bowel complaints I have seen and examined Terry Robles and agree with the above assessment  and plan.  Grace Isaac MD Beeper [phone] Office [phone] 08/05/2013 5:27 PM

## 2013-08-05 NOTE — Progress Notes (Signed)
Utilization review completed.  

## 2013-08-05 NOTE — Progress Notes (Signed)
Additional Medicare IM given to pt and signed copy placed in shadow chart

## 2013-08-06 ENCOUNTER — Inpatient Hospital Stay (HOSPITAL_COMMUNITY): Payer: Medicare Other

## 2013-08-06 NOTE — Progress Notes (Addendum)
       [address]       [place] [address]             [phone]          7 Days Post-Op Procedure(s) (LRB): VIDEO BRONCHOSCOPY (N/A) MEDIASTINOSCOPY (N/A) VIDEO ASSISTED THORACOSCOPY (Right) THORACOTOMY FOR CHEST WALL RESECTION, RIGHT UPPER LOBECTOMY  Subjective: Feeling well, no complaints.  Nausea resolved, +BM last night.   Objective: Vital signs in last 24 hours: Patient Vitals for the past 24 hrs:  BP Temp Temp src Pulse Resp SpO2  08/06/13 0805 112/59 mmHg - - 72 19 95 %  08/06/13 0700 - 98.3 F (36.8 C) Oral - - -  08/06/13 0341 122/64 mmHg 98.1 F (36.7 C) Oral 69 15 97 %  08/05/13 2351 120/62 mmHg 98.3 F (36.8 C) Oral 86 15 95 %  08/05/13 1950 134/93 mmHg 98.3 F (36.8 C) Oral 86 15 94 %  08/05/13 1614 117/57 mmHg 98.1 F (36.7 C) Oral 77 13 97 %  08/05/13 1231 115/57 mmHg 97.9 F (36.6 C) Oral 82 19 95 %   Current Weight  08/02/13 209 lb 3.5 oz (94.9 kg)     Intake/Output from previous day: 05/11 0701 - 05/12 0700 In: 360 [P.O.:360] Out: 600 [Urine:600]    PHYSICAL EXAM:  Heart: RRR Lungs: Clear Wound:Clean and dry    Lab Results: CBC:No results found for this basename: WBC, HGB, HCT, PLT,  in the last 72 hours BMET: No results found for this basename: NA, K, CL, CO2, GLUCOSE, BUN, CREATININE, CALCIUM,  in the last 72 hours  PT/INR: No results found for this basename: LABPROT, INR,  in the last 72 hours    Assessment/Plan: S/P Procedure(s) (LRB): VIDEO BRONCHOSCOPY (N/A) MEDIASTINOSCOPY (N/A) VIDEO ASSISTED THORACOSCOPY (Right) THORACOTOMY FOR CHEST WALL RESECTION, RIGHT UPPER LOBECTOMY CXR ordered for 0600 not done yet. Will check CXR this am. If this is stable, plan discharge home.  Instructions reviewed with patient.   LOS: 7 days    Coolidge Breeze 08/06/2013  Chest xray stable  Plan d/c today I have seen and examined Terry Robles and agree with the above assessment  and plan.  Grace Isaac  MD Beeper [phone] Office [phone] 08/06/2013 12:33 PM

## 2013-08-12 ENCOUNTER — Ambulatory Visit (INDEPENDENT_AMBULATORY_CARE_PROVIDER_SITE_OTHER): Payer: Self-pay

## 2013-08-12 DIAGNOSIS — G8918 Other acute postprocedural pain: Secondary | ICD-10-CM

## 2013-08-12 DIAGNOSIS — Z4802 Encounter for removal of sutures: Secondary | ICD-10-CM

## 2013-08-12 DIAGNOSIS — C3490 Malignant neoplasm of unspecified part of unspecified bronchus or lung: Secondary | ICD-10-CM

## 2013-08-12 MED ORDER — TRAMADOL HCL 50 MG PO TABS: 50.0000 mg | ORAL_TABLET | Freq: Four times a day (QID) | ORAL | Status: DC | PRN

## 2013-08-12 MED ORDER — OXYCODONE HCL 5 MG PO TABS: 5.0000 mg | ORAL_TABLET | ORAL | Status: DC | PRN

## 2013-08-12 NOTE — Progress Notes (Signed)
  Removed 36 staples from right thoracotomy site. No signs of infection. Removed 4 sutures from right Chest tube sites, there was some serous fluid draining from one of the chest tube sites, No redness or swelling, Surrounding skin was pink and intact. Patient c/o increased pain and stinging while staples were being removed. He took pain medication 2 hours prior to appt.  Dr Roxy Manns refilled patients pain mediations Oxycodone 5 mg and Tramadol 50 mg. He is scheduled to see Dr Servando Snare on June 1st.

## 2013-08-13 ENCOUNTER — Other Ambulatory Visit: Payer: Self-pay | Admitting: *Deleted

## 2013-08-13 ENCOUNTER — Telehealth: Payer: Self-pay | Admitting: Internal Medicine

## 2013-08-13 DIAGNOSIS — C3410 Malignant neoplasm of upper lobe, unspecified bronchus or lung: Secondary | ICD-10-CM

## 2013-08-13 NOTE — Telephone Encounter (Signed)
s.w.l pt and advised on June appt....pt ok adn aware

## 2013-08-20 ENCOUNTER — Other Ambulatory Visit: Payer: Self-pay | Admitting: *Deleted

## 2013-08-20 DIAGNOSIS — Z4802 Encounter for removal of sutures: Secondary | ICD-10-CM

## 2013-08-20 DIAGNOSIS — G8918 Other acute postprocedural pain: Secondary | ICD-10-CM

## 2013-08-20 MED ORDER — TRAMADOL HCL 50 MG PO TABS: 50.0000 mg | ORAL_TABLET | Freq: Four times a day (QID) | ORAL | Status: DC | PRN

## 2013-08-26 ENCOUNTER — Ambulatory Visit (INDEPENDENT_AMBULATORY_CARE_PROVIDER_SITE_OTHER): Payer: Self-pay | Admitting: Physician Assistant

## 2013-08-26 ENCOUNTER — Other Ambulatory Visit: Payer: Self-pay | Admitting: Cardiothoracic Surgery

## 2013-08-26 ENCOUNTER — Ambulatory Visit
Admission: RE | Admit: 2013-08-26 | Discharge: 2013-08-26 | Disposition: A | Discharge status: Home / Self Care | Payer: Medicare Other | Source: Ambulatory Visit | Attending: Cardiothoracic Surgery | Admitting: Cardiothoracic Surgery

## 2013-08-26 VITALS — BP 94/64 | HR 100 | Resp 20 | Ht 73.0 in | Wt 209.0 lb

## 2013-08-26 DIAGNOSIS — C3410 Malignant neoplasm of upper lobe, unspecified bronchus or lung: Secondary | ICD-10-CM

## 2013-08-26 DIAGNOSIS — Z09 Encounter for follow-up examination after completed treatment for conditions other than malignant neoplasm: Secondary | ICD-10-CM

## 2013-08-26 NOTE — Progress Notes (Signed)
  HPI:  Patient returns for routine postoperative follow-up having undergone Video Bronchoscopy, Mediastinoscopy, Right Thoracotomy with Right Upper Lobectomy and chest wall resection with ribs 2,3,and 4.  The patient's early postoperative recovery while in the hospital was unremarkable.  The patient presents today for 2 week follow up.  Overall the patient states he is doing okay.  He continues to have some pain along his right anterior chest and into his back below his scapula.  He is able to ambulate without difficulty.  He states that he is not eating much either due to food not tasting right since surgery.  Finally he states that he is constipated since surgery, only experiencing bowel movements every other day.  Current Outpatient Prescriptions  Medication Sig Dispense Refill  . cycloSPORINE (RESTASIS) 0.05 % ophthalmic emulsion 1 drop 2 (two) times daily.      . Multiple Vitamin (MULITIVITAMIN WITH MINERALS) TABS Take 1 tablet by mouth daily.      . polyethylene glycol (MIRALAX / GLYCOLAX) packet Take 17 g by mouth daily.      . traMADol (ULTRAM) 50 MG tablet Take 1-2 tablets (50-100 mg total) by mouth every 6 (six) hours as needed for moderate pain (mild pain).  30 tablet  0   No current facility-administered medications for this visit.    Physical Exam  BP 94/64  Pulse 100  Resp 20  Ht 6\' 1"  (1.854 m)  Wt 209 lb (94.802 kg)  BMI 27.58 kg/m2  SpO2 98%  Gen: no apparent distress Heart: RRR Lungs: Diminished right base Skin: incision is well healed, suture present along midline portion of thoracotomy incision  Diagnostic Tests:  CXR:  Post surgical changes, no pneumothorax, some posterior pleural fluid   A/P:  1.  S/P Thoracotomy with chest wall resection- doing well 2. Constipation- encouraged patient to continue to use of Miralax/stool softners- however with bowel movements every other day, I would not consider him to be constipated.  I told patient that with not eating  his normal diet prior to surgery and decrease in his level of activity it will take a few weeks for his bowels to recover 3. Pain- encouraged patient to try ibuprofen and continue Tramadol for more severe pain 4. Dispo- RTC in 6 weeks with CXR with Dr. Servando Snare

## 2013-09-01 ENCOUNTER — Encounter: Payer: Self-pay | Admitting: Physician Assistant

## 2013-09-02 ENCOUNTER — Telehealth: Payer: Self-pay | Admitting: *Deleted

## 2013-09-02 ENCOUNTER — Other Ambulatory Visit: Payer: Self-pay | Admitting: Physician Assistant

## 2013-09-02 ENCOUNTER — Encounter: Payer: Self-pay | Admitting: *Deleted

## 2013-09-02 DIAGNOSIS — C3411 Malignant neoplasm of upper lobe, right bronchus or lung: Secondary | ICD-10-CM

## 2013-09-02 MED ORDER — OXYCODONE-ACETAMINOPHEN 5-325 MG PO TABS: 1.0000 | ORAL_TABLET | Freq: Four times a day (QID) | ORAL | Status: DC | PRN

## 2013-09-02 NOTE — Telephone Encounter (Signed)
Called pt to re-schedule appt.  He verbalized understanding of appt time and place.

## 2013-09-02 NOTE — Progress Notes (Signed)
Dr. Julien Nordmann aware of pt needing pain medication.  Burnetta Sabin will order.

## 2013-09-12 ENCOUNTER — Encounter: Payer: Self-pay | Admitting: *Deleted

## 2013-09-12 ENCOUNTER — Ambulatory Visit (HOSPITAL_BASED_OUTPATIENT_CLINIC_OR_DEPARTMENT_OTHER): Payer: Medicare Other | Admitting: Internal Medicine

## 2013-09-12 ENCOUNTER — Ambulatory Visit: Payer: Medicare Other | Attending: Internal Medicine | Admitting: Physical Therapy

## 2013-09-12 ENCOUNTER — Encounter: Payer: Self-pay | Admitting: Internal Medicine

## 2013-09-12 ENCOUNTER — Telehealth: Payer: Self-pay | Admitting: Internal Medicine

## 2013-09-12 ENCOUNTER — Other Ambulatory Visit (HOSPITAL_BASED_OUTPATIENT_CLINIC_OR_DEPARTMENT_OTHER): Payer: Medicare Other

## 2013-09-12 VITALS — BP 118/67 | HR 72 | Temp 98.5°F | Resp 18 | Ht 73.0 in | Wt 195.3 lb

## 2013-09-12 DIAGNOSIS — C3490 Malignant neoplasm of unspecified part of unspecified bronchus or lung: Secondary | ICD-10-CM

## 2013-09-12 DIAGNOSIS — C3410 Malignant neoplasm of upper lobe, unspecified bronchus or lung: Secondary | ICD-10-CM

## 2013-09-12 DIAGNOSIS — C782 Secondary malignant neoplasm of pleura: Secondary | ICD-10-CM

## 2013-09-12 LAB — COMPREHENSIVE METABOLIC PANEL (CC13)
ALBUMIN: 2.8 g/dL — AB (ref 3.5–5.0)
ALT: 24 U/L (ref 0–55)
AST: 19 U/L (ref 5–34)
Alkaline Phosphatase: 165 U/L — ABNORMAL HIGH (ref 40–150)
Anion Gap: 7 mEq/L (ref 3–11)
BUN: 10.8 mg/dL (ref 7.0–26.0)
CO2: 28 mEq/L (ref 22–29)
Calcium: 9.5 mg/dL (ref 8.4–10.4)
Chloride: 106 mEq/L (ref 98–109)
Creatinine: 0.8 mg/dL (ref 0.7–1.3)
Glucose: 107 mg/dl (ref 70–140)
POTASSIUM: 4.2 meq/L (ref 3.5–5.1)
Sodium: 141 mEq/L (ref 136–145)
Total Bilirubin: 0.31 mg/dL (ref 0.20–1.20)
Total Protein: 6.5 g/dL (ref 6.4–8.3)

## 2013-09-12 LAB — CBC WITH DIFFERENTIAL/PLATELET
BASO%: 0.7 % (ref 0.0–2.0)
Basophils Absolute: 0 10*3/uL (ref 0.0–0.1)
EOS%: 6.5 % (ref 0.0–7.0)
Eosinophils Absolute: 0.4 10*3/uL (ref 0.0–0.5)
HCT: 35.4 % — ABNORMAL LOW (ref 38.4–49.9)
HGB: 11.9 g/dL — ABNORMAL LOW (ref 13.0–17.1)
LYMPH%: 12.1 % — ABNORMAL LOW (ref 14.0–49.0)
MCH: 33.2 pg (ref 27.2–33.4)
MCHC: 33.7 g/dL (ref 32.0–36.0)
MCV: 98.4 fL — AB (ref 79.3–98.0)
MONO#: 0.7 10*3/uL (ref 0.1–0.9)
MONO%: 12.2 % (ref 0.0–14.0)
NEUT#: 4.1 10*3/uL (ref 1.5–6.5)
NEUT%: 68.5 % (ref 39.0–75.0)
Platelets: 339 10*3/uL (ref 140–400)
RBC: 3.6 10*6/uL — AB (ref 4.20–5.82)
RDW: 15.5 % — AB (ref 11.0–14.6)
WBC: 6 10*3/uL (ref 4.0–10.3)
lymph#: 0.7 10*3/uL — ABNORMAL LOW (ref 0.9–3.3)

## 2013-09-12 MED ORDER — OXYCODONE-ACETAMINOPHEN 5-325 MG PO TABS: 1.0000 | ORAL_TABLET | Freq: Four times a day (QID) | ORAL | Status: DC | PRN

## 2013-09-12 NOTE — Progress Notes (Signed)
Spoke with pt at [facility] clinic. Resource/Support services given and explained

## 2013-09-12 NOTE — Progress Notes (Signed)
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 Status post 5 cycles. 2) Bronchoscopy, mediastinoscopy, right posterior lateral thoracotomy with right upper lobectomy, lymph node dissection,  and posterior approach to en bloc chest wall resection, ribs 2, 3, and 4, with positive bronchial resection margin.  CURRENT THERAPY: None.  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    INTERVAL HISTORY: Terry Robles 73 y.o. male returns for followup visit accompanied by his wife. He tolerated the previous course of concurrent chemoradiation fairly well with partial response. The patient underwent bronchoscopy, mediastinoscopy was right posterior lateral thoracotomy with right upper lobectomy and lymph node dissection as well as en bloc chest wall resection, ribs, 3 and 4 under the care of Dr.  Servando Snare on 07/30/2013. The final pathology showed invasive squamous cell carcinoma with tumor invading the parietal pleura was involvement of the chest with soft tissue as well as invasive tumor present at the bronchial margin . The final pathologic stage was yT3, pN0, pMX).  He is recovering well from his surgery except for the persistent pain on the right side and back. He is currently on Percocet 5/325 mg and he takes between 3-4 tablets everyday. He denied having any significant shortness of breath, cough or hemoptysis. He has no fever or chills. He denied having any significant nausea or vomiting. He is here today for evaluation and discussion of his treatment options based on the final pathology report.  MEDICAL HISTORY: Past Medical History  Diagnosis Date  . Hypercholesteremia 05/14/2011  . BPH (benign prostatic hyperplasia) 05/14/2011  . BPH (benign prostatic hyperplasia)   . Shortness of breath     with exertion  . Wears glasses   . Pneumonia   . GERD (gastroesophageal reflux disease)   . Headache(784.0)   . Cancer     skin cancer squamous on left arm    ALLERGIES:  has No Known Allergies.  MEDICATIONS:  Current Outpatient Prescriptions  Medication Sig Dispense Refill  . cycloSPORINE (RESTASIS) 0.05 % ophthalmic emulsion 1 drop 2 (two) times daily.      . Multiple Vitamin (MULITIVITAMIN WITH MINERALS) TABS Take 1 tablet by mouth daily.      Marland Kitchen oxyCODONE-acetaminophen (PERCOCET/ROXICET) 5-325 MG per tablet Take 1 tablet by mouth every 6 (six) hours as needed for severe pain.  30 tablet  0  . polyethylene glycol (MIRALAX /  GLYCOLAX) packet Take 17 g by mouth daily.       No current facility-administered medications for this visit.    SURGICAL HISTORY:  Past Surgical History  Procedure Laterality Date  . Tonsillectomy    . Other surgical history      benign tumor removed from left leg several years ago  . Other surgical history      had melanoma/squamous cell removed  previously per patient  . Tumor excision      Left knee  . Colonoscopy w/ biopsies and polypectomy    . Video bronchoscopy N/A 07/30/2013    Procedure: VIDEO BRONCHOSCOPY;  Surgeon: Grace Isaac, MD;  Location: [place];  Service: Thoracic;  Laterality: N/A;  . Mediastinoscopy N/A 07/30/2013    Procedure: MEDIASTINOSCOPY;  Surgeon: Grace Isaac, MD;  Location: [place];  Service: Thoracic;  Laterality: N/A;  . Video assisted thoracoscopy (vats)/wedge resection Right 07/30/2013    Procedure: VIDEO ASSISTED THORACOSCOPY;  Surgeon: Grace Isaac, MD;  Location: [place];  Service: Thoracic;  Laterality: Right;  . Thoracotomy  07/30/2013    Procedure: THORACOTOMY FOR CHEST WALL RESECTION, RIGHT UPPER LOBECTOMY;  Surgeon: Grace Isaac, MD;  Location: [place];  Service: Thoracic;;    REVIEW OF SYSTEMS:  Constitutional: positive for fatigue Eyes: negative Ears, nose, mouth, throat, and face: negative Respiratory: positive for dyspnea on exertion and pleurisy/chest pain Cardiovascular: negative Gastrointestinal: positive for nausea Genitourinary:negative Integument/breast: negative Hematologic/lymphatic: negative Musculoskeletal:negative Neurological: negative Behavioral/Psych: negative Endocrine: negative Allergic/Immunologic: negative   PHYSICAL EXAMINATION: General appearance: alert, cooperative, appears stated age and no distress Head: Normocephalic, without obvious abnormality, atraumatic Neck: no adenopathy, no carotid bruit, no JVD, supple, symmetrical, trachea midline and thyroid not enlarged, symmetric, no tenderness/mass/nodules Lymph nodes: Cervical, supraclavicular, and axillary nodes normal. Resp: clear to auscultation bilaterally Back: symmetric, no curvature. ROM normal. No CVA tenderness. Cardio: regular rate and rhythm, S1, S2 normal, no murmur, click, rub or gallop GI: soft, non-tender; bowel sounds normal; no masses,  no organomegaly Extremities: extremities normal,  atraumatic, no cyanosis or edema Neurologic: Alert and oriented X 3, normal strength and tone. Normal symmetric reflexes. Normal coordination and gait  ECOG PERFORMANCE STATUS: 1 - Symptomatic but completely ambulatory  Blood pressure 118/67, pulse 72, temperature 98.5 F (36.9 C), temperature source Oral, resp. rate 18, height 6\' 1"  (1.854 m), weight 195 lb 4.8 oz (88.587 kg), SpO2 100.00%.  LABORATORY DATA: Lab Results  Component Value Date   WBC 6.0 09/12/2013   HGB 11.9* 09/12/2013   HCT 35.4* 09/12/2013   MCV 98.4* 09/12/2013   PLT 339 09/12/2013      Chemistry      Component Value Date/Time   NA 140 08/03/2013 0430   NA 141 07/24/2013 0955   K 3.5* 08/03/2013 0430   K 4.5 07/24/2013 0955   CL 105 08/03/2013 0430   CO2 24 08/03/2013 0430   CO2 24 07/24/2013 0955   BUN 12 08/03/2013 0430   BUN 12.9 07/24/2013 0955   CREATININE 0.68 08/03/2013 0430   CREATININE 1.0 07/24/2013 0955      Component Value Date/Time   CALCIUM 8.5 08/03/2013 0430   CALCIUM 9.8 07/24/2013 0955   ALKPHOS 117 08/01/2013 0400   ALKPHOS 84 07/24/2013 0955   AST 38* 08/01/2013 0400   AST 14 07/24/2013 0955   ALT 24 08/01/2013 0400   ALT 9 07/24/2013 0955   BILITOT 0.7 08/01/2013 0400   BILITOT 0.51 07/24/2013 0955  RADIOGRAPHIC STUDIES:  ASSESSMENT/PLAN:  This is a very pleasant 73 years old white male recently diagnosed with a stage IIIA non-small cell lung cancer. He is currently undergoing concurrent chemoradiation with weekly carboplatin and paclitaxel is status post 5 cycle, this was followed by right upper lobectomy with lymph node dissection under the care of Dr. Servando Snare. The final pathology showed positive bronchial resection margin with a pathologic stage ypT3, ypN0. The patient is here today for evaluation and discussion of his treatment options. I have a lengthy discussion with the patient and his wife today about his current disease status and treatment options. I recommended for the patient to have a course  of curative radiotherapy to the best of resection margin. I will arrange for the patient a followup appointment with Dr. Tammi Klippel for discussion of this option. I would also consider the patient for 3 cycles of consolidation chemotherapy with carboplatin and paclitaxel every 3 weeks with Neulasta support after completion of the radiotherapy treatment. I would see the patient back for followup visit in 2 months for reevaluation and more detailed discussion of his systemic treatment options. He was given a refill of pain medication with Percocet 5/325 mg every 6 hours as needed. He was advised to call immediately if he has any concerning symptoms in the interval. I spent 20 minutes in face-to-face counseling with the patient and his wife out of the total visit time 30 minutes.  Disclaimer: This note was dictated with voice recognition software. Similar sounding words can inadvertently be transcribed and may not be corrected upon review.

## 2013-09-12 NOTE — Telephone Encounter (Signed)
gave pt appt for lab and MD for August, talked to Santiago Glad in Radiation pt will be called for appt with Dr Tammi Klippel, pt aware

## 2013-09-13 ENCOUNTER — Encounter: Payer: Self-pay | Admitting: Internal Medicine

## 2013-09-13 ENCOUNTER — Telehealth: Payer: Self-pay | Admitting: *Deleted

## 2013-09-13 NOTE — Telephone Encounter (Signed)
Called pt to follow up from [facility] and give appt with Dr. Tammi Klippel.  He stated he was aware of appt and had no questions after clinic.

## 2013-09-17 ENCOUNTER — Encounter: Payer: Self-pay | Admitting: Radiation Oncology

## 2013-09-17 NOTE — Progress Notes (Signed)
Thoracic Location of Tumor / Histology: right lung   Biopsies of posterior right upper lobe lung (if applicable) revealed: The final pathology showed invasive squamous cell carcinoma with tumor invading the parietal pleura was involvement of the chest with soft tissue as well as invasive tumor present at the bronchial margin . The final pathologic stage was yT3, pN0, pMX).    Tobacco/Marijuana/Snuff/ETOH use: former smoker quit in 03/1998  Past/Anticipated interventions by cardiothoracic surgery, if any: underwent bronchoscopy, mediastinoscopy was right posterior lateral thoracotomy with right upper lobectomy and lymph node dissection as well as en bloc chest wall resection, ribs, 3 and 4 under the care of Dr. Servando Snare on 07/30/2013   Past/Anticipated interventions by medical oncology, if any: 3 cycles of consolidation chemotherapy with carboplatin and paclitaxel every 3 weeks with Neulasta support after completion of the radiotherapy treatment.   Signs/Symptoms  Weight changes, if any:  Respiratory complaints, if any:   Hemoptysis, if any:   Pain issues, if any:    SAFETY ISSUES:  Prior radiation? yes  Pacemaker/ICD? no   Possible current pregnancy?no  Is the patient on methotrexate? no  Current Complaints / other details:  73 year old male.

## 2013-09-18 ENCOUNTER — Ambulatory Visit
Admission: RE | Admit: 2013-09-18 | Discharge: 2013-09-18 | Disposition: A | Discharge status: Home / Self Care | Payer: Medicare Other | Source: Ambulatory Visit | Attending: Radiation Oncology | Admitting: Radiation Oncology

## 2013-09-18 ENCOUNTER — Encounter: Payer: Self-pay | Admitting: Radiation Oncology

## 2013-09-18 VITALS — BP 115/72 | HR 68 | Resp 18 | Ht 74.0 in | Wt 197.9 lb

## 2013-09-18 DIAGNOSIS — Z902 Acquired absence of lung [part of]: Secondary | ICD-10-CM | POA: Diagnosis not present

## 2013-09-18 DIAGNOSIS — C3490 Malignant neoplasm of unspecified part of unspecified bronchus or lung: Secondary | ICD-10-CM

## 2013-09-18 DIAGNOSIS — C3410 Malignant neoplasm of upper lobe, unspecified bronchus or lung: Secondary | ICD-10-CM | POA: Insufficient documentation

## 2013-09-18 DIAGNOSIS — R0602 Shortness of breath: Secondary | ICD-10-CM | POA: Diagnosis not present

## 2013-09-18 DIAGNOSIS — Z510 Encounter for antineoplastic radiation therapy: Secondary | ICD-10-CM | POA: Insufficient documentation

## 2013-09-18 DIAGNOSIS — R5381 Other malaise: Secondary | ICD-10-CM | POA: Diagnosis not present

## 2013-09-18 DIAGNOSIS — R1310 Dysphagia, unspecified: Secondary | ICD-10-CM | POA: Diagnosis not present

## 2013-09-18 DIAGNOSIS — R5383 Other fatigue: Secondary | ICD-10-CM

## 2013-09-18 NOTE — Progress Notes (Signed)
See progress note under physician encounter. 

## 2013-09-18 NOTE — Progress Notes (Addendum)
  Radiation Oncology         [phone] ________________________________  Name: Terry Robles MRN: [id number]  Date: 09/18/2013  DOB: 1941-03-14  COMPLEX SIMULATION NOTE  Malignant neoplasm of right upper lobe of lung T3 N2 M0 Squamous Cell Carcinoma of the Right Superior Sulcus   Primary site: Lung (Right)   Staging method: AJCC 7th Edition   Clinical: Stage IIIA (T3, N2, M0) signed by Curt Bears, MD on 05/09/2013  2:56 PM   Summary: Stage IIIA (T3, N2, M0)  NARRATIVE:  The patient was brought to the [facility].  Identity was confirmed.  All relevant records and images related to the planned course of therapy were reviewed.  The patient freely provided informed written consent to proceed with treatment after reviewing the details related to the planned course of therapy. The consent form was witnessed and verified by the simulation staff.  Then, the patient was set-up in a stable reproducible  supine position for radiation therapy.  CT images were obtained.  Surface markings were placed.  The CT images were loaded into the planning software.  Then the target and avoidance structures were contoured.  Treatment planning then occurred.  The radiation prescription was entered and confirmed.  Then, I designed and supervised the construction of a total of 3 medically necessary complex treatment devices.  I have requested : Isodose Plan.  I have requested : 3D Simulation  I have requested a DVH of the following structures: Lungs, spinal cord, heart, esophagus and target.   SPECIAL TREATMENT PROCEDURE:  The planned course of therapy using radiation constitutes a special treatment procedure. Special care is required in the management of this patient for the following reasons.  I have requested : This treatment constitutes a Special Treatment Procedure for the following reason: [ Retreatment in a previously radiated area requiring careful monitoring of increased risk of toxicity due  to overlap of previous treatment.  The special nature of the planned course of radiotherapy will require increased physician supervision and oversight to ensure patient's safety with optimal treatment outcomes.  PLAN:  The patient will receive 35 Gy in 14 fractions.  ________________________________  Terry Robles, M.D.

## 2013-09-18 NOTE — Progress Notes (Signed)
Had surgery on May 5. Right upper back surgical incision well approximated with redness, edema or drainage. Reports right shoulder often hurts as well. Reports taking three percocet tablets per day at breakfast, lunch and dinner. Productive cough with clear sputum. Shortness of breath with mild exertion. "Froggy" voice. Denies difficulty swallowing. Wife reports the patient has an excellent appetite. Denies constipation since starting Miralax. Denies headache, dizziness, nausea, vomiting, diplopia, or ringing in the ears. Weight stable.

## 2013-09-18 NOTE — Progress Notes (Signed)
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  After completing pre-op chemo-RT, he underwent bronchoscopy, mediastinoscopy, right posterior lateral thoracotomy with right upper lobectomy, lymph node dissection, and posterior approach to en bloc chest wall resection, ribs 2, 3, and 4.  Path showed a negative lymph nodes and positive bronchial margin. Reports right shoulder often hurts. Reports taking three percocet tablets per day at breakfast, lunch and dinner. Productive cough with clear sputum. Shortness of breath with mild exertion. "Froggy" voice. Denies difficulty swallowing. Wife reports the patient has an excellent appetite. Denies constipation since starting Miralax. Denies headache, dizziness, nausea, vomiting, diplopia, or ringing in the ears.                           ALLERGIES:  has No Known Allergies.  Meds: Current Outpatient Prescriptions  Medication Sig Dispense Refill  . cycloSPORINE (RESTASIS) 0.05 % ophthalmic emulsion 1 drop 2 (two) times daily.      Marland Kitchen dutasteride (AVODART) 0.5 MG capsule Take 0.5 mg by mouth daily.      . Multiple Vitamin (MULITIVITAMIN WITH MINERALS) TABS Take 1 tablet by mouth daily.      Marland Kitchen oxyCODONE-acetaminophen (PERCOCET/ROXICET) 5-325 MG per tablet Take 1 tablet by mouth every 6 (six) hours as needed for severe pain.  60 tablet  0  . polyethylene glycol (MIRALAX / GLYCOLAX) packet Take 17 g by mouth daily.       No current facility-administered medications for this encounter.    Physical Findings: The patient is in  no acute distress. Patient is alert and oriented.  height is 6\' 2"  (1.88 m) and weight is 197 lb 14.4 oz (89.767 kg). His blood pressure is 115/72 and his pulse is 68. His respiration is 18 and oxygen saturation is 100%.  Weight stable.  .Right upper back surgical incision well approximated with redness, edema or drainage.   Lab Findings: Lab Results  Component Value Date   WBC 6.0 09/12/2013   HGB 11.9* 09/12/2013   HCT 35.4* 09/12/2013   MCV 98.4* 09/12/2013   PLT 339 09/12/2013    @LASTCHEM @  Radiographic Findings: Dg Chest 2 View  08/26/2013   CLINICAL DATA:  History of right upper lobe lung cancer and recent surgery  EXAM: CHEST  2 VIEW  COMPARISON:  08/06/2013  FINDINGS: The surgical staples of been removed. Postoperative changes are again noted on the right. Increasing density in the residual right apex is noted. This likely represents a degree of scarring. No other focal abnormality is seen. Old rib fractures are again noted on the left.  IMPRESSION: Postsurgical changes on the right. Increased density is noted in the right lung apex likely related to a degree of scarring. Acute infiltrate cannot be totally excluded.   Electronically Signed   By: Inez Catalina M.D.   On: 08/26/2013 11:59    Impression:  The patient is recovering from surgery.  His positive bronchial margin increases his risk of local recurrence, and additional radiotherapy to this area would  reduce the risk of recurrence.  Plan:  Today, I talked to the patient and wife about the operative findings.  We discussed the natural history of positive margin and general treatment, highlighting the role of adjuvant radiotherapy in the management.  We discussed the available radiation techniques, and focused on the details of logistics and delivery.  The patient would like to proceed with radiation and will be scheduled for CT simulation.  I spent 30 minutes minutes face to face with the patient and more than 50% of that time was  spent in counseling and/or coordination of care.   _____________________________________  Sheral Apley. Tammi Klippel, M.D.

## 2013-09-20 ENCOUNTER — Encounter: Payer: Self-pay | Admitting: Internal Medicine

## 2013-09-20 DIAGNOSIS — Z510 Encounter for antineoplastic radiation therapy: Secondary | ICD-10-CM | POA: Diagnosis not present

## 2013-09-20 NOTE — Telephone Encounter (Signed)
This message printed for Dr. Julien Nordmann.  For concurrent chemotherapy/RT.  RT start date is 09-25-2013.

## 2013-09-24 ENCOUNTER — Encounter: Payer: Self-pay | Admitting: Radiation Oncology

## 2013-09-24 ENCOUNTER — Other Ambulatory Visit: Payer: Medicare Other

## 2013-09-24 ENCOUNTER — Ambulatory Visit: Payer: Medicare Other | Admitting: Internal Medicine

## 2013-09-24 ENCOUNTER — Ambulatory Visit
Admission: RE | Admit: 2013-09-24 | Discharge: 2013-09-24 | Disposition: A | Discharge status: Home / Self Care | Payer: Medicare Other | Source: Ambulatory Visit | Attending: Radiation Oncology | Admitting: Radiation Oncology

## 2013-09-24 DIAGNOSIS — Z510 Encounter for antineoplastic radiation therapy: Secondary | ICD-10-CM | POA: Diagnosis not present

## 2013-09-24 NOTE — Progress Notes (Signed)
  Radiation Oncology         [phone] ________________________________  Name: Terry Robles MRN: [id number]  Date: 09/24/2013  DOB: [birth year]  Simulation Verification Note  Status: outpatient  NARRATIVE: The patient was brought to the treatment unit and placed in the planned treatment position. The clinical setup was verified. Then port films were obtained and uploaded to the radiation oncology medical record software.  The treatment beams were carefully compared against the planned radiation fields. The position location and shape of the radiation fields was reviewed. The targeted volume of tissue appears appropriately covered by the radiation beams. Organs at risk appear to be excluded as planned.  Based on my personal review, I approved the simulation verification. The patient's treatment will proceed as planned.  ------------------------------------------------  Thea Silversmith, MD

## 2013-09-25 ENCOUNTER — Ambulatory Visit
Admission: RE | Admit: 2013-09-25 | Discharge: 2013-09-25 | Disposition: A | Discharge status: Home / Self Care | Payer: Medicare Other | Source: Ambulatory Visit | Attending: Radiation Oncology | Admitting: Radiation Oncology

## 2013-09-25 DIAGNOSIS — Z510 Encounter for antineoplastic radiation therapy: Secondary | ICD-10-CM | POA: Diagnosis not present

## 2013-09-26 ENCOUNTER — Ambulatory Visit
Admission: RE | Admit: 2013-09-26 | Discharge: 2013-09-26 | Disposition: A | Discharge status: Home / Self Care | Payer: Medicare Other | Source: Ambulatory Visit | Attending: Radiation Oncology | Admitting: Radiation Oncology

## 2013-09-26 DIAGNOSIS — C3410 Malignant neoplasm of upper lobe, unspecified bronchus or lung: Secondary | ICD-10-CM

## 2013-09-26 DIAGNOSIS — Z510 Encounter for antineoplastic radiation therapy: Secondary | ICD-10-CM | POA: Diagnosis not present

## 2013-09-26 NOTE — Progress Notes (Signed)
   Department of Radiation Oncology  Phone:  [phone] Fax:        [phone]  Weekly Treatment Note    Name: Terry Robles Date: 09/26/2013 MRN: [id number] DOB: August 30, 1940   Current dose: 4 Gy  Current fraction: 2   MEDICATIONS: Current Outpatient Prescriptions  Medication Sig Dispense Refill  . cycloSPORINE (RESTASIS) 0.05 % ophthalmic emulsion 1 drop 2 (two) times daily.      Marland Kitchen dutasteride (AVODART) 0.5 MG capsule Take 0.5 mg by mouth daily.      . Multiple Vitamin (MULITIVITAMIN WITH MINERALS) TABS Take 1 tablet by mouth daily.      Marland Kitchen oxyCODONE-acetaminophen (PERCOCET/ROXICET) 5-325 MG per tablet Take 1 tablet by mouth every 6 (six) hours as needed for severe pain.  60 tablet  0  . polyethylene glycol (MIRALAX / GLYCOLAX) packet Take 17 g by mouth daily.       No current facility-administered medications for this encounter.     ALLERGIES: Review of patient's allergies indicates no known allergies.   LABORATORY DATA:  Lab Results  Component Value Date   WBC 6.0 09/12/2013   HGB 11.9* 09/12/2013   HCT 35.4* 09/12/2013   MCV 98.4* 09/12/2013   PLT 339 09/12/2013   Lab Results  Component Value Date   NA 141 09/12/2013   K 4.2 09/12/2013   CL 105 08/03/2013   CO2 28 09/12/2013   Lab Results  Component Value Date   ALT 24 09/12/2013   AST 19 09/12/2013   ALKPHOS 165* 09/12/2013   BILITOT 0.31 09/12/2013     NARRATIVE: Terry Robles was seen today for weekly treatment management. The chart was checked and the patient's films were reviewed. The patient is doing very well in his first week of treatment. No complaints except some postoperative discomfort which was expected.  PHYSICAL EXAMINATION:    no skin change  ASSESSMENT: The patient is doing satisfactorily with treatment.  PLAN: We will continue with the patient's radiation treatment as planned.

## 2013-09-30 ENCOUNTER — Other Ambulatory Visit: Payer: Self-pay | Admitting: Internal Medicine

## 2013-09-30 ENCOUNTER — Ambulatory Visit
Admission: RE | Admit: 2013-09-30 | Discharge: 2013-09-30 | Disposition: A | Discharge status: Home / Self Care | Payer: Medicare Other | Source: Ambulatory Visit | Attending: Radiation Oncology | Admitting: Radiation Oncology

## 2013-09-30 DIAGNOSIS — C3410 Malignant neoplasm of upper lobe, unspecified bronchus or lung: Secondary | ICD-10-CM

## 2013-09-30 DIAGNOSIS — Z510 Encounter for antineoplastic radiation therapy: Secondary | ICD-10-CM | POA: Diagnosis not present

## 2013-10-01 ENCOUNTER — Ambulatory Visit
Admission: RE | Admit: 2013-10-01 | Discharge: 2013-10-01 | Disposition: A | Discharge status: Home / Self Care | Payer: Medicare Other | Source: Ambulatory Visit | Attending: Radiation Oncology | Admitting: Radiation Oncology

## 2013-10-01 DIAGNOSIS — Z510 Encounter for antineoplastic radiation therapy: Secondary | ICD-10-CM | POA: Diagnosis not present

## 2013-10-01 MED ORDER — OXYCODONE-ACETAMINOPHEN 5-325 MG PO TABS: 1.0000 | ORAL_TABLET | Freq: Four times a day (QID) | ORAL | Status: DC | PRN

## 2013-10-02 ENCOUNTER — Encounter: Payer: Self-pay | Admitting: Internal Medicine

## 2013-10-02 ENCOUNTER — Ambulatory Visit
Admission: RE | Admit: 2013-10-02 | Discharge: 2013-10-02 | Disposition: A | Discharge status: Home / Self Care | Payer: Medicare Other | Source: Ambulatory Visit | Attending: Radiation Oncology | Admitting: Radiation Oncology

## 2013-10-02 DIAGNOSIS — Z510 Encounter for antineoplastic radiation therapy: Secondary | ICD-10-CM | POA: Diagnosis not present

## 2013-10-03 ENCOUNTER — Ambulatory Visit
Admission: RE | Admit: 2013-10-03 | Discharge: 2013-10-03 | Disposition: A | Discharge status: Home / Self Care | Payer: Medicare Other | Source: Ambulatory Visit | Attending: Radiation Oncology | Admitting: Radiation Oncology

## 2013-10-03 DIAGNOSIS — Z510 Encounter for antineoplastic radiation therapy: Secondary | ICD-10-CM | POA: Diagnosis not present

## 2013-10-04 ENCOUNTER — Ambulatory Visit
Admission: RE | Admit: 2013-10-04 | Discharge: 2013-10-04 | Disposition: A | Discharge status: Home / Self Care | Payer: Medicare Other | Source: Ambulatory Visit | Attending: Radiation Oncology | Admitting: Radiation Oncology

## 2013-10-04 ENCOUNTER — Encounter: Payer: Self-pay | Admitting: Radiation Oncology

## 2013-10-04 DIAGNOSIS — Z510 Encounter for antineoplastic radiation therapy: Secondary | ICD-10-CM | POA: Diagnosis not present

## 2013-10-04 NOTE — Progress Notes (Signed)
Reports shortness of breath when the humidity is high outside. Reports a persistent dry cough. Reports discomfort associated with swallowing. Denies skin changes within treatment field. Reports using radiaplex bid as directed. Weight and vitals stable. Flat affect. States, "I hurt all over."

## 2013-10-07 ENCOUNTER — Ambulatory Visit
Admission: RE | Admit: 2013-10-07 | Discharge: 2013-10-07 | Disposition: A | Discharge status: Home / Self Care | Payer: Medicare Other | Source: Ambulatory Visit | Attending: Radiation Oncology | Admitting: Radiation Oncology

## 2013-10-07 DIAGNOSIS — Z510 Encounter for antineoplastic radiation therapy: Secondary | ICD-10-CM | POA: Diagnosis not present

## 2013-10-08 ENCOUNTER — Other Ambulatory Visit: Payer: Self-pay | Admitting: Cardiothoracic Surgery

## 2013-10-08 ENCOUNTER — Ambulatory Visit
Admission: RE | Admit: 2013-10-08 | Discharge: 2013-10-08 | Disposition: A | Discharge status: Home / Self Care | Payer: Medicare Other | Source: Ambulatory Visit | Attending: Radiation Oncology | Admitting: Radiation Oncology

## 2013-10-08 DIAGNOSIS — Z510 Encounter for antineoplastic radiation therapy: Secondary | ICD-10-CM | POA: Diagnosis not present

## 2013-10-08 DIAGNOSIS — C3410 Malignant neoplasm of upper lobe, unspecified bronchus or lung: Secondary | ICD-10-CM

## 2013-10-09 ENCOUNTER — Ambulatory Visit
Admission: RE | Admit: 2013-10-09 | Discharge: 2013-10-09 | Disposition: A | Discharge status: Home / Self Care | Payer: Medicare Other | Source: Ambulatory Visit | Attending: Radiation Oncology | Admitting: Radiation Oncology

## 2013-10-09 DIAGNOSIS — Z510 Encounter for antineoplastic radiation therapy: Secondary | ICD-10-CM | POA: Diagnosis not present

## 2013-10-10 ENCOUNTER — Encounter: Payer: Self-pay | Admitting: Cardiothoracic Surgery

## 2013-10-10 ENCOUNTER — Ambulatory Visit
Admission: RE | Admit: 2013-10-10 | Discharge: 2013-10-10 | Disposition: A | Discharge status: Home / Self Care | Payer: Medicare Other | Source: Ambulatory Visit | Attending: Radiation Oncology | Admitting: Radiation Oncology

## 2013-10-10 ENCOUNTER — Other Ambulatory Visit: Payer: Self-pay | Admitting: *Deleted

## 2013-10-10 ENCOUNTER — Ambulatory Visit (INDEPENDENT_AMBULATORY_CARE_PROVIDER_SITE_OTHER): Payer: Self-pay | Admitting: Cardiothoracic Surgery

## 2013-10-10 ENCOUNTER — Ambulatory Visit
Admission: RE | Admit: 2013-10-10 | Discharge: 2013-10-10 | Disposition: A | Discharge status: Home / Self Care | Payer: Medicare Other | Source: Ambulatory Visit | Attending: Cardiothoracic Surgery | Admitting: Cardiothoracic Surgery

## 2013-10-10 VITALS — BP 109/66 | HR 78 | Resp 20 | Ht 74.0 in | Wt 199.0 lb

## 2013-10-10 DIAGNOSIS — Z510 Encounter for antineoplastic radiation therapy: Secondary | ICD-10-CM | POA: Diagnosis not present

## 2013-10-10 DIAGNOSIS — C3410 Malignant neoplasm of upper lobe, unspecified bronchus or lung: Secondary | ICD-10-CM

## 2013-10-10 DIAGNOSIS — Z09 Encounter for follow-up examination after completed treatment for conditions other than malignant neoplasm: Secondary | ICD-10-CM

## 2013-10-10 NOTE — Progress Notes (Signed)
[address]       [place] [address]             [phone]      Terry Robles [facility] Medical Record #[id number] Date of Birth: 1940-07-14  Referring: Curt Bears, MD Primary Care: [name], MD  Chief Complaint:   POST OP FOLLOW UP 07/30/2013  OPERATIVE REPORT  PREOPERATIVE DIAGNOSIS: Non-small cell lung cancer, right upper lobe  with chest wall invasion.  POSTOPERATIVE DIAGNOSIS: Non-small cell lung cancer, right upper lobe  with chest wall invasion.  SURGICAL PROCEDURE: Bronchoscopy, mediastinoscopy, right posterior  lateral thoracotomy with right upper lobectomy, lymph node dissection,  and posterior approach to en bloc chest wall resection, ribs 2, 3, and  4.  History of Present Illness:     Patient returns to the office stay in followup after his right upper lobectomy lymph node dissection on block resection of the upper right chest wall. Because of positive truncal margin the patient has undergone further radiation therapy, he has 2 treatments left. Chest x-ray was done in the office today and suggests a destructive rib lesion in left chest. The patient denies any pain in this area. He has been recovering from his surgery well with decreasing right chest wall pain. He notes yesterday he had to admit this wife to the emergency room because of upper respiratory infection.     Past Medical History  Diagnosis Date  . Hypercholesteremia 05/14/2011  . BPH (benign prostatic hyperplasia) 05/14/2011  . BPH (benign prostatic hyperplasia)   . Shortness of breath     with exertion  . Wears glasses   . Pneumonia   . GERD (gastroesophageal reflux disease)   . Headache(784.0)   . 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 Cancer     squamous cell carcinoma right superior sulcus  . Lung cancer     right lung  . History of radiation therapy      History  Smoking status  . Former Smoker -- 1.00 packs/day for 42 years  . Types:  Cigarettes  . Quit date: 03/28/1998  Smokeless tobacco  . Never Used    History  Alcohol Use  . Yes    Comment: occasional/ beer - no more than one beer in 24 hrs      No Known Allergies  Current Outpatient Prescriptions  Medication Sig Dispense Refill  . cycloSPORINE (RESTASIS) 0.05 % ophthalmic emulsion 1 drop 2 (two) times daily.      Marland Kitchen dutasteride (AVODART) 0.5 MG capsule Take 0.5 mg by mouth daily.      . Multiple Vitamin (MULITIVITAMIN WITH MINERALS) TABS Take 1 tablet by mouth daily.      Marland Kitchen oxyCODONE-acetaminophen (PERCOCET/ROXICET) 5-325 MG per tablet Take 1 tablet by mouth every 6 (six) hours as needed for severe pain.  60 tablet  0  . polyethylene glycol (MIRALAX / GLYCOLAX) packet Take 17 g by mouth daily.       No current facility-administered medications for this visit.       Physical Exam: BP 109/66  Pulse 78  Resp 20  Ht 6\' 2"  (1.88 m)  Wt 199 lb (90.266 kg)  BMI 25.54 kg/m2  SpO2 98%  General appearance: alert and cooperative Neurologic: intact Heart: regular rate and rhythm, S1, S2 normal, no murmur, click, rub or gallop Lungs: clear to auscultation bilaterally Abdomen: soft, non-tender; bowel sounds normal; no masses,  no  organomegaly Extremities: extremities normal, atraumatic, no cyanosis or edema and Homans sign is negative, no sign of DVT Wound: His right thoracotomy incision is well-healed, I do not appreciate cervical supraclavicular or axillary adenopathy, he has a palpable lipoma in the left chest that has been present for many years. He has no palpable point tenderness around the left chest wall in the area of question on chest x-ray   Diagnostic Studies & Laboratory data:     Recent Radiology Findings:   Dg Chest 2 View  10/10/2013   CLINICAL DATA:  History right lung cancer, status post surgery 07/2013  EXAM: CHEST  2 VIEW  COMPARISON:  Chest radiograph dated 08/26/2013 CT chest dated 07/24/2013  FINDINGS: Postsurgical changes in the  right upper hemithorax with volume loss and increased right apical capping/fluid.  Pleural-based lesion along the left lateral hemithorax, new/increased, with destruction of the left lateral 7th rib.  No pneumothorax.  The heart is normal in size.  Degenerative changes of the visualized thoracolumbar spine.  IMPRESSION: Postsurgical changes in the right upper hemithorax with increased right apical capping/fluid.  Pleural-based lesion along the left lateral hemithorax, new/increased, with destruction of the left lateral 7th rib.  Consider CT chest with contrast for further evaluation.  These results will be called to the ordering clinician or representative by the Radiologist Assistant, and communication documented in the PACS or zVision Dashboard.   Electronically Signed   By: Julian Hy M.D.   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 With plans to continue with chemotherapy after last dose of postoperative radiation. Today's chest x-ray is suggestive of a left chest wall mass. I reviewed the x-ray with the patient and made arrangements for CT scan of the chest with contrast tomorrow after his early morning radiation treatment. The findings and this may influence plans for further treatment. I plan to see him back soon after the CT scan is performed.      Grace Isaac  MD      [place] [place] [address] Office [phone]   Beeper [phone]  10/10/2013 11:02 AM

## 2013-10-11 ENCOUNTER — Ambulatory Visit
Admission: RE | Admit: 2013-10-11 | Discharge: 2013-10-11 | Disposition: A | Discharge status: Home / Self Care | Payer: Medicare Other | Source: Ambulatory Visit | Attending: Radiation Oncology | Admitting: Radiation Oncology

## 2013-10-11 ENCOUNTER — Encounter: Payer: Self-pay | Admitting: Radiation Oncology

## 2013-10-11 ENCOUNTER — Ambulatory Visit (HOSPITAL_COMMUNITY)
Admission: RE | Admit: 2013-10-11 | Discharge: 2013-10-11 | Disposition: A | Discharge status: Home / Self Care | Payer: Medicare Other | Source: Ambulatory Visit | Attending: Cardiothoracic Surgery | Admitting: Cardiothoracic Surgery

## 2013-10-11 ENCOUNTER — Encounter (HOSPITAL_COMMUNITY): Payer: Self-pay

## 2013-10-11 VITALS — BP 146/83 | HR 67 | Temp 97.7°F | Resp 20 | Wt 197.6 lb

## 2013-10-11 DIAGNOSIS — R918 Other nonspecific abnormal finding of lung field: Secondary | ICD-10-CM | POA: Diagnosis not present

## 2013-10-11 DIAGNOSIS — C3410 Malignant neoplasm of upper lobe, unspecified bronchus or lung: Secondary | ICD-10-CM

## 2013-10-11 DIAGNOSIS — R222 Localized swelling, mass and lump, trunk: Secondary | ICD-10-CM | POA: Diagnosis not present

## 2013-10-11 DIAGNOSIS — C7951 Secondary malignant neoplasm of bone: Secondary | ICD-10-CM

## 2013-10-11 DIAGNOSIS — R599 Enlarged lymph nodes, unspecified: Secondary | ICD-10-CM | POA: Diagnosis not present

## 2013-10-11 DIAGNOSIS — J90 Pleural effusion, not elsewhere classified: Secondary | ICD-10-CM | POA: Diagnosis not present

## 2013-10-11 DIAGNOSIS — Z9221 Personal history of antineoplastic chemotherapy: Secondary | ICD-10-CM | POA: Insufficient documentation

## 2013-10-11 DIAGNOSIS — C3490 Malignant neoplasm of unspecified part of unspecified bronchus or lung: Secondary | ICD-10-CM | POA: Insufficient documentation

## 2013-10-11 DIAGNOSIS — Z923 Personal history of irradiation: Secondary | ICD-10-CM | POA: Diagnosis not present

## 2013-10-11 DIAGNOSIS — Z510 Encounter for antineoplastic radiation therapy: Secondary | ICD-10-CM | POA: Diagnosis not present

## 2013-10-11 DIAGNOSIS — J438 Other emphysema: Secondary | ICD-10-CM | POA: Insufficient documentation

## 2013-10-11 DIAGNOSIS — Z902 Acquired absence of lung [part of]: Secondary | ICD-10-CM | POA: Diagnosis not present

## 2013-10-11 DIAGNOSIS — C7952 Secondary malignant neoplasm of bone marrow: Secondary | ICD-10-CM

## 2013-10-11 DIAGNOSIS — K8020 Calculus of gallbladder without cholecystitis without obstruction: Secondary | ICD-10-CM | POA: Insufficient documentation

## 2013-10-11 MED ORDER — IOHEXOL 300 MG/ML  SOLN
80.0000 mL | Freq: Once | INTRAMUSCULAR | Status: AC | PRN
  Administered 2013-10-11: 80 mL via INTRAVENOUS

## 2013-10-11 NOTE — Progress Notes (Signed)
  Radiation Oncology         [phone] ________________________________  Name: Terry Robles MRN: [id number]  Date: 10/11/2013  DOB: October 17, 1940  Weekly Radiation Therapy Management  Current Dose: 24 Gy     Planned Dose:  26 Gy  Narrative . . . . . . . . The patient presents for routine under treatment assessment.                                   The patient is without complaint.  Patient states his right shoulder and upper back "always hurt". He takes Oxycodone every morning, afternoon and at bedtime for fair pain control. He states "It knocks the edge off." Pt reports fatigue, "feels like there is something in my throat when I swallow", SOB w/exertion and on hot humid days, occasional dry cough. Pt denies skin irritation of right chest treatment area. Pt completes treatment on 10/14/13; gave him 1 month FU card He was found to have a left rib cage soft tissue mass on CXR and has CT today to rule out rib metastasis.  No pain there.                                 Set-up films were reviewed.                                 The chart was checked. Physical Findings. . .  weight is 197 lb 9.6 oz (89.631 kg). His temperature is 97.7 F (36.5 C). His blood pressure is 146/83 and his pulse is 67. His respiration is 20 and oxygen saturation is 100%. . Weight essentially stable.  No significant changes. Impression . . . . . . . The patient is tolerating radiation. Plan . . . . . . . . . . . . Continue treatment as planned.  Follow-up on CT result.  If rib met appears oligometastatic (5 or fewer mets) on CT and subsequent PET, could consider SBRT for ablation.  If more widespread metastatic disease is discovered, reserve XRT for palliation of symptoms.  ________________________________  Sheral Apley Tammi Klippel, M.D.

## 2013-10-11 NOTE — Progress Notes (Signed)
  Radiation Oncology         [phone] ________________________________  Name: Terry Robles  MRN: [id number]  Date: 10/11/2013  DOB: 1940-11-02  Telephone contact:  The patient this evening to review his chest CT result. It appears to demonstrate a new left rib metastasis representing the possible development of stage IV hematogenous metastases.  I have taken the liberty of ordering a restaging PET/CT to be performed on July 24. Once we get more information regarding the extent of metastatic disease burden, we will formulate a plan with medical oncology. ________________________________  Sheral Apley Tammi Klippel, M.D.

## 2013-10-11 NOTE — Progress Notes (Signed)
Patient states his right shoulder and upper back "always hurt". He takes Oxycodone every morning, afternoon and at bedtime for fair pain control. He states "It knocks the edge off." Pt reports fatigue, "feels like there is something in my throat when I swallow", SOB w/exertion and on hot humid days, occasional dry cough. Pt denies skin irritation of right chest treatment area. Pt completes treatment on 10/14/13; gave him 1 month FU card.

## 2013-10-12 ENCOUNTER — Other Ambulatory Visit: Payer: Self-pay | Admitting: Internal Medicine

## 2013-10-13 ENCOUNTER — Telehealth: Payer: Self-pay | Admitting: Internal Medicine

## 2013-10-13 NOTE — Telephone Encounter (Signed)
s.w. pt and advised on July appt....pt ok and aware

## 2013-10-14 ENCOUNTER — Ambulatory Visit: Payer: Medicare Other

## 2013-10-14 ENCOUNTER — Ambulatory Visit
Admission: RE | Admit: 2013-10-14 | Discharge: 2013-10-14 | Disposition: A | Discharge status: Home / Self Care | Payer: Medicare Other | Source: Ambulatory Visit | Attending: Radiation Oncology | Admitting: Radiation Oncology

## 2013-10-14 ENCOUNTER — Encounter: Payer: Self-pay | Admitting: Radiation Oncology

## 2013-10-14 DIAGNOSIS — Z510 Encounter for antineoplastic radiation therapy: Secondary | ICD-10-CM | POA: Diagnosis not present

## 2013-10-15 ENCOUNTER — Encounter: Payer: Self-pay | Admitting: Internal Medicine

## 2013-10-15 ENCOUNTER — Other Ambulatory Visit (HOSPITAL_BASED_OUTPATIENT_CLINIC_OR_DEPARTMENT_OTHER): Payer: Medicare Other

## 2013-10-15 ENCOUNTER — Ambulatory Visit: Payer: Medicare Other

## 2013-10-15 ENCOUNTER — Telehealth: Payer: Self-pay | Admitting: Radiation Oncology

## 2013-10-15 ENCOUNTER — Ambulatory Visit (HOSPITAL_BASED_OUTPATIENT_CLINIC_OR_DEPARTMENT_OTHER): Payer: Medicare Other | Admitting: Internal Medicine

## 2013-10-15 ENCOUNTER — Encounter: Payer: Self-pay | Admitting: Radiation Oncology

## 2013-10-15 ENCOUNTER — Telehealth: Payer: Self-pay | Admitting: Internal Medicine

## 2013-10-15 VITALS — BP 113/67 | HR 68 | Temp 97.8°F | Resp 18 | Ht 74.0 in | Wt 197.5 lb

## 2013-10-15 DIAGNOSIS — C7952 Secondary malignant neoplasm of bone marrow: Secondary | ICD-10-CM

## 2013-10-15 DIAGNOSIS — C3490 Malignant neoplasm of unspecified part of unspecified bronchus or lung: Secondary | ICD-10-CM

## 2013-10-15 DIAGNOSIS — C7951 Secondary malignant neoplasm of bone: Secondary | ICD-10-CM

## 2013-10-15 DIAGNOSIS — C782 Secondary malignant neoplasm of pleura: Secondary | ICD-10-CM

## 2013-10-15 DIAGNOSIS — C3410 Malignant neoplasm of upper lobe, unspecified bronchus or lung: Secondary | ICD-10-CM

## 2013-10-15 DIAGNOSIS — C3492 Malignant neoplasm of unspecified part of left bronchus or lung: Secondary | ICD-10-CM

## 2013-10-15 LAB — CBC WITH DIFFERENTIAL/PLATELET
BASO%: 0.6 % (ref 0.0–2.0)
Basophils Absolute: 0 10*3/uL (ref 0.0–0.1)
EOS ABS: 0.1 10*3/uL (ref 0.0–0.5)
EOS%: 1.8 % (ref 0.0–7.0)
HEMATOCRIT: 39.4 % (ref 38.4–49.9)
HGB: 13.1 g/dL (ref 13.0–17.1)
LYMPH%: 10.7 % — AB (ref 14.0–49.0)
MCH: 32.7 pg (ref 27.2–33.4)
MCHC: 33.3 g/dL (ref 32.0–36.0)
MCV: 98.2 fL — AB (ref 79.3–98.0)
MONO#: 0.6 10*3/uL (ref 0.1–0.9)
MONO%: 10.7 % (ref 0.0–14.0)
NEUT%: 76.2 % — AB (ref 39.0–75.0)
NEUTROS ABS: 4.4 10*3/uL (ref 1.5–6.5)
PLATELETS: 309 10*3/uL (ref 140–400)
RBC: 4.01 10*6/uL — AB (ref 4.20–5.82)
RDW: 13.9 % (ref 11.0–14.6)
WBC: 5.8 10*3/uL (ref 4.0–10.3)
lymph#: 0.6 10*3/uL — ABNORMAL LOW (ref 0.9–3.3)

## 2013-10-15 LAB — COMPREHENSIVE METABOLIC PANEL (CC13)
ALK PHOS: 109 U/L (ref 40–150)
ALT: 20 U/L (ref 0–55)
ANION GAP: 8 meq/L (ref 3–11)
AST: 18 U/L (ref 5–34)
Albumin: 3.3 g/dL — ABNORMAL LOW (ref 3.5–5.0)
BILIRUBIN TOTAL: 0.46 mg/dL (ref 0.20–1.20)
BUN: 11.6 mg/dL (ref 7.0–26.0)
CO2: 24 mEq/L (ref 22–29)
CREATININE: 0.8 mg/dL (ref 0.7–1.3)
Calcium: 9.8 mg/dL (ref 8.4–10.4)
Chloride: 106 mEq/L (ref 98–109)
GLUCOSE: 103 mg/dL (ref 70–140)
Potassium: 4 mEq/L (ref 3.5–5.1)
SODIUM: 138 meq/L (ref 136–145)
TOTAL PROTEIN: 7 g/dL (ref 6.4–8.3)

## 2013-10-15 NOTE — Telephone Encounter (Signed)
Attempting to return message left by patient last night.

## 2013-10-15 NOTE — Telephone Encounter (Signed)
Reached patient via phone after third attempt. Explained that per Richardson Landry in radiology patient MAY take his pain medication with a sip of water the morning of his PET scan. Patient verbalized understanding and expressed appreciation for the call.

## 2013-10-15 NOTE — Telephone Encounter (Signed)
Pt confirmed labs/ov per 07/21 [facility] sent msg for chemo to be added, gave AVS to pt.Marland Kitchen..KJ

## 2013-10-15 NOTE — Progress Notes (Signed)
[facility]  Telephone:([phone]) [phone] [phone] OFFICE VISIT PROGRESS NOTE  [name], MD 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   Primary site: Lung (Right)   Staging method: AJCC 7th Edition   Clinical: Stage IIIA (T3, N2, M0) signed by Curt Bears, MD on 05/09/2013  2:56 PM   Summary: Stage IIIA (T3, N2, M0)  PRIOR THERAPY:  1) Concurrent chemoradiation with weekly carboplatin for an AUC of 2 and paclitaxel 45 mg per meter squared. Status post 5 cycles. 2) Bronchoscopy, mediastinoscopy, right posterior lateral thoracotomy with right upper lobectomy, lymph node dissection,  and posterior approach to en bloc chest wall resection, ribs 2, 3, and 4, with positive bronchial resection margin. 3) palliative radiotherapy to the best of resection margin under the care of Dr. Tammi Klippel completed on 10/14/2013.  CURRENT THERAPY: Systemic chemotherapy with carboplatin for AUC of 5 on day 1 and Abraxane 100 mg/M2 on days 1, 8 and 15 every 3 weeks. First cycle 10/21/2013.  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    INTERVAL HISTORY: Terry Robles 73 y.o. male returns  for followup visit accompanied by his wife. The patient completed on 10/14/2013 a course of radiotherapy to the positive of resection margin of the new surgical resection of his lung cancer. He recently complained of left sided chest pain and CT scan of the chest performed on 10/11/2013 showed left posterior chest wall mass is seen with destruction of left posterior lateral seventh rib. This measures 3.6 x 2.7 cm. This is new since previous study and consistent with a lytic bone metastasis. No other suspicious bone lesions seen within the thorax. There was also mild mediastinal adenopathy in the subcarinal region shows increased since previous study, with lymph node measuring 16 mm compared with 7 mm previously. No other pathologically enlarged nodes are seen within the thorax. The patient was referred to me today for evaluation and discussion of his treatment options based on the new findings. He is feeling fine today with no specific complaints except for shortness of breath with exertion but no significant chest pain. He has mild cough with no hemoptysis. He denied having any significant weight loss or night sweats. He has no nausea or vomiting.    MEDICAL HISTORY: Past Medical History  Diagnosis Date  . Hypercholesteremia 05/14/2011  . BPH (benign prostatic hyperplasia) 05/14/2011  . BPH (benign prostatic hyperplasia)   . Shortness of breath     with exertion  . Wears glasses   . Pneumonia   . GERD (gastroesophageal reflux disease)   . Headache(784.0)   . History of radiation therapy   . 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  Cancer     squamous cell carcinoma right superior sulcus  . Lung cancer     right lung    ALLERGIES:  has No Known Allergies.  MEDICATIONS:  Current Outpatient Prescriptions  Medication Sig Dispense Refill  . cycloSPORINE (RESTASIS) 0.05 % ophthalmic emulsion 1 drop 2 (two) times daily.      Marland Kitchen dutasteride (AVODART) 0.5 MG capsule Take 0.5 mg by  mouth daily.      . Multiple Vitamin (MULITIVITAMIN WITH MINERALS) TABS Take 1 tablet by mouth daily.      Marland Kitchen oxyCODONE-acetaminophen (PERCOCET/ROXICET) 5-325 MG per tablet Take 1 tablet by mouth every 6 (six) hours as needed for severe pain.  60 tablet  0  . polyethylene glycol (MIRALAX / GLYCOLAX) packet Take 17 g by mouth daily.       No current facility-administered medications for this visit.    SURGICAL HISTORY:  Past Surgical History  Procedure Laterality Date  . Tonsillectomy    . Other surgical history      benign tumor removed from left leg several years ago  . Other surgical history      had melanoma/squamous cell removed previously per patient  . Tumor excision      Left knee  . Colonoscopy w/ biopsies and polypectomy    . Video bronchoscopy N/A 07/30/2013    Procedure: VIDEO BRONCHOSCOPY;  Surgeon: Grace Isaac, MD;  Location: [facility];  Service: Thoracic;  Laterality: N/A;  . Mediastinoscopy N/A 07/30/2013    Procedure: MEDIASTINOSCOPY;  Surgeon: Grace Isaac, MD;  Location: [facility];  Service: Thoracic;  Laterality: N/A;  . Video assisted thoracoscopy (vats)/wedge resection Right 07/30/2013    Procedure: VIDEO ASSISTED THORACOSCOPY;  Surgeon: Grace Isaac, MD;  Location: [facility];  Service: Thoracic;  Laterality: Right;  . Thoracotomy  07/30/2013    Procedure: THORACOTOMY FOR CHEST WALL RESECTION, RIGHT UPPER LOBECTOMY;  Surgeon: Grace Isaac, MD;  Location: [facility];  Service: Thoracic;;    REVIEW OF SYSTEMS:  Constitutional: positive for fatigue Eyes: negative Ears, nose, mouth, throat, and face: negative Respiratory: positive for dyspnea on exertion and pleurisy/chest pain Cardiovascular: negative Gastrointestinal: positive for nausea Genitourinary:negative Integument/breast: negative Hematologic/lymphatic: negative Musculoskeletal:negative Neurological: negative Behavioral/Psych: negative Endocrine: negative Allergic/Immunologic: negative   PHYSICAL  EXAMINATION: General appearance: alert, cooperative, appears stated age and no distress Head: Normocephalic, without obvious abnormality, atraumatic Neck: no adenopathy, no carotid bruit, no JVD, supple, symmetrical, trachea midline and thyroid not enlarged, symmetric, no tenderness/mass/nodules Lymph nodes: Cervical, supraclavicular, and axillary nodes normal. Resp: clear to auscultation bilaterally Back: symmetric, no curvature. ROM normal. No CVA tenderness. Cardio: regular rate and rhythm, S1, S2 normal, no murmur, click, rub or gallop GI: soft, non-tender; bowel sounds normal; no masses,  no organomegaly Extremities: extremities normal, atraumatic, no cyanosis or edema Neurologic: Alert and oriented X 3, normal strength and tone. Normal symmetric reflexes. Normal coordination and gait  ECOG PERFORMANCE STATUS: 1 - Symptomatic but completely ambulatory  Blood pressure 113/67, pulse 68, temperature 97.8 F (36.6 C), temperature source Oral, resp. rate 18, height 6\' 2"  (1.88 m), weight 197 lb 8 oz (89.585 kg), SpO2 99.00%.  LABORATORY DATA: Lab Results  Component Value Date   WBC 5.8 10/15/2013   HGB 13.1 10/15/2013   HCT 39.4 10/15/2013   MCV 98.2* 10/15/2013   PLT 309 10/15/2013      Chemistry      Component Value Date/Time   NA 138 10/15/2013 0926   NA 140 08/03/2013  0430   K 4.0 10/15/2013 0926   K 3.5* 08/03/2013 0430   CL 105 08/03/2013 0430   CO2 24 10/15/2013 0926   CO2 24 08/03/2013 0430   BUN 11.6 10/15/2013 0926   BUN 12 08/03/2013 0430   CREATININE 0.8 10/15/2013 0926   CREATININE 0.68 08/03/2013 0430      Component Value Date/Time   CALCIUM 9.8 10/15/2013 0926   CALCIUM 8.5 08/03/2013 0430   ALKPHOS 109 10/15/2013 0926   ALKPHOS 117 08/01/2013 0400   AST 18 10/15/2013 0926   AST 38* 08/01/2013 0400   ALT 20 10/15/2013 0926   ALT 24 08/01/2013 0400   BILITOT 0.46 10/15/2013 0926   BILITOT 0.7 08/01/2013 0400       RADIOGRAPHIC STUDIES: Dg Chest 2 View  10/10/2013   CLINICAL DATA:   History right lung cancer, status post surgery 07/2013  EXAM: CHEST  2 VIEW  COMPARISON:  Chest radiograph dated 08/26/2013 CT chest dated 07/24/2013  FINDINGS: Postsurgical changes in the right upper hemithorax with volume loss and increased right apical capping/fluid.  Pleural-based lesion along the left lateral hemithorax, new/increased, with destruction of the left lateral 7th rib.  No pneumothorax.  The heart is normal in size.  Degenerative changes of the visualized thoracolumbar spine.  IMPRESSION: Postsurgical changes in the right upper hemithorax with increased right apical capping/fluid.  Pleural-based lesion along the left lateral hemithorax, new/increased, with destruction of the left lateral 7th rib.  Consider CT chest with contrast for further evaluation.  These results will be called to the ordering clinician or representative by the Radiologist Assistant, and communication documented in the PACS or zVision Dashboard.   Electronically Signed   By: Julian Hy M.D.   On: 10/10/2013 09:42   Ct Chest W Contrast  10/11/2013   CLINICAL DATA:  Followup metastatic lung carcinoma. Recent right thoracotomy. On going radiation therapy. Recently completed chemotherapy.  EXAM: CT CHEST WITH CONTRAST  TECHNIQUE: Multidetector CT imaging of the chest was performed during intravenous contrast administration.  CONTRAST:  77mL OMNIPAQUE IOHEXOL 300 MG/ML  SOLN  COMPARISON:  07/24/2013  FINDINGS: Patient has undergone right thoracotomy and right upper lobectomy since prior exam. Parenchymal consolidation and central air bronchograms is seen in the right posterior upper lung field, with surgical clips in the adjacent right hilum. 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 A small right pleural effusion is noted, however there is no evidence of associated pleural nodularity or chest wall mass. No suspicious pulmonary nodules or masses are seen involving either lung. Mild emphysema noted. No evidence of  left-sided pleural effusion.  No hilar lymphadenopathy identified. Mild mediastinal adenopathy in the subcarinal region shows increased since previous study, with lymph node measuring 16 mm on image 27 compared with 7 mm previously. No other pathologically enlarged nodes are seen within the thorax.  A left posterior chest wall mass is seen with destruction of left posterior lateral seventh rib. This measures 3.6 x 2.7 cm on image 30. This is new since previous study and consistent with a lytic bone metastasis. No other suspicious bone lesions seen within the thorax.  Both adrenal glands are normal in appearance. Tiny calcified gallstones again seen, however there is no evidence of cholecystitis. A subcutaneous lesion is seen in the left anterior chest wall measuring 1.7 x 5.3 cm which is stable and consistent with a sebaceous cyst.  IMPRESSION: Postop changes from interval right upper lobectomy. 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 Small right pleural effusion also noted.  Increased mild mediastinal lymphadenopathy in the subcarinal region, suspicious for metastatic disease.  New lytic bone metastasis with associated chest wall mass involving the left posterior seventh rib.   Electronically Signed   By: Earle Gell M.D.   On: 10/11/2013 14:39   ASSESSMENT/PLAN:  This is a very pleasant 73 years old white male recently diagnosed with a stage IIIA non-small cell lung cancer. He completed a course of concurrent chemoradiation with weekly carboplatin and paclitaxel status post 5 cycle, this was followed by right upper lobectomy with lymph node dissection under the care of Dr. Servando Snare. The final pathology showed positive bronchial resection margin with a pathologic stage ypT3, ypN0. The patient underwent a course of radiotherapy to the positive resection margin but unfortunately the recent CT scan of the chest showed evidence for  disease progression with new lytic rib lesion as well as enlarged mediastinal lymphadenopathy. I have a lengthy discussion with the patient and his wife today about his current disease status and treatment options. He is scheduled for a PET scan on 10/18/2013. I will also order an MRI of the brain to rule out brain metastasis. I have a lengthy discussion with the patient about his treatment options and I explained to him that he is currently has incurable condition especially with metastatic disease in the rib. I gave him the option of palliative care versus systemic chemotherapy with carboplatin and Abraxane. He is interested in systemic chemotherapy and he would be treated with carboplatin for AUC of 5 on day 1 and Abraxane 100 mg/M2 on days 1, 8 and 15 every 3 weeks. I discussed with the patient adverse effect of this treatment including but not limited to alopecia, myelosuppression, nausea and vomiting, peripheral neuropathy, liver or renal dysfunction. The patient would like to proceed with treatment as planned and he gives a verbal consent. He is expected to start the first cycle of this treatment on 10/21/2013. He would come back for follow up visit in 2 weeks for reevaluation and management any adverse effect of his treatment. He was advised to call immediately if he has any concerning symptoms in the interval. I spent 20 minutes in face-to-face counseling with the patient and his wife out of the total visit time 30 minutes.  Disclaimer: This note was dictated with voice recognition software. Similar sounding words can inadvertently be transcribed and may not be corrected upon review.

## 2013-10-16 ENCOUNTER — Telehealth: Payer: Self-pay | Admitting: *Deleted

## 2013-10-16 ENCOUNTER — Encounter: Payer: Self-pay | Admitting: Internal Medicine

## 2013-10-16 NOTE — Telephone Encounter (Signed)
Per [facility] staff message scheduled appts. Advised scheduler 

## 2013-10-17 ENCOUNTER — Other Ambulatory Visit: Payer: Self-pay | Admitting: Internal Medicine

## 2013-10-17 DIAGNOSIS — C3410 Malignant neoplasm of upper lobe, unspecified bronchus or lung: Secondary | ICD-10-CM

## 2013-10-18 ENCOUNTER — Other Ambulatory Visit: Payer: Self-pay | Admitting: *Deleted

## 2013-10-18 ENCOUNTER — Ambulatory Visit (INDEPENDENT_AMBULATORY_CARE_PROVIDER_SITE_OTHER): Payer: Self-pay | Admitting: Cardiothoracic Surgery

## 2013-10-18 ENCOUNTER — Ambulatory Visit (HOSPITAL_COMMUNITY)
Admission: RE | Admit: 2013-10-18 | Discharge: 2013-10-18 | Disposition: A | Discharge status: Home / Self Care | Payer: Medicare Other | Source: Ambulatory Visit | Attending: Radiation Oncology | Admitting: Radiation Oncology

## 2013-10-18 ENCOUNTER — Encounter (HOSPITAL_COMMUNITY): Payer: Self-pay

## 2013-10-18 ENCOUNTER — Encounter: Payer: Self-pay | Admitting: Cardiothoracic Surgery

## 2013-10-18 VITALS — BP 102/62 | HR 86 | Resp 16 | Ht 74.0 in | Wt 197.0 lb

## 2013-10-18 DIAGNOSIS — M899 Disorder of bone, unspecified: Secondary | ICD-10-CM | POA: Diagnosis not present

## 2013-10-18 DIAGNOSIS — C7951 Secondary malignant neoplasm of bone: Secondary | ICD-10-CM

## 2013-10-18 DIAGNOSIS — C3410 Malignant neoplasm of upper lobe, unspecified bronchus or lung: Secondary | ICD-10-CM

## 2013-10-18 DIAGNOSIS — C7952 Secondary malignant neoplasm of bone marrow: Secondary | ICD-10-CM

## 2013-10-18 DIAGNOSIS — C3490 Malignant neoplasm of unspecified part of unspecified bronchus or lung: Secondary | ICD-10-CM | POA: Insufficient documentation

## 2013-10-18 DIAGNOSIS — J90 Pleural effusion, not elsewhere classified: Secondary | ICD-10-CM | POA: Insufficient documentation

## 2013-10-18 DIAGNOSIS — M949 Disorder of cartilage, unspecified: Secondary | ICD-10-CM

## 2013-10-18 DIAGNOSIS — R918 Other nonspecific abnormal finding of lung field: Secondary | ICD-10-CM | POA: Insufficient documentation

## 2013-10-18 LAB — GLUCOSE, CAPILLARY: GLUCOSE-CAPILLARY: 106 mg/dL — AB (ref 70–99)

## 2013-10-18 MED ORDER — OXYCODONE-ACETAMINOPHEN 5-325 MG PO TABS: 1.0000 | ORAL_TABLET | Freq: Four times a day (QID) | ORAL | Status: DC | PRN

## 2013-10-18 MED ORDER — FLUDEOXYGLUCOSE F - 18 (FDG) INJECTION: 10.4000 | Freq: Once | INTRAVENOUS | Status: AC | PRN

## 2013-10-18 NOTE — Progress Notes (Signed)
[address]       [place] [address]             [phone]      Terry Robles [facility] Medical Record #[id number] Date of Birth: January 20, 1941  Referring: Curt Bears, MD Primary Care: [name], MD  Chief Complaint:   POST OP FOLLOW UP 07/30/2013  OPERATIVE REPORT  PREOPERATIVE DIAGNOSIS: Non-small cell lung cancer, right upper lobe  with chest wall invasion.  POSTOPERATIVE DIAGNOSIS: Non-small cell lung cancer, right upper lobe  with chest wall invasion.  SURGICAL PROCEDURE: Bronchoscopy, mediastinoscopy, right posterior  lateral thoracotomy with right upper lobectomy, lymph node dissection,  and posterior approach to en bloc chest wall resection, ribs 2, 3, and  4.  History of Present Illness:     Patient returns to the office stay in followup after his right upper lobectomy lymph node dissection on block resection of the upper right chest wall. Because of positive truncal margin the patient has undergone further radiation therapy, he has 2 treatments left. Chest x-ray was done in the office today and suggests a destructive rib lesion in left chest. This is confirmed on CT scan .The patient denies any pain in this area. He has been recovering from his surgery well with decreasing right chest wall pain.    Past Medical History  Diagnosis Date  . Hypercholesteremia 05/14/2011  . BPH (benign prostatic hyperplasia) 05/14/2011  . BPH (benign prostatic hyperplasia)   . Shortness of breath     with exertion  . Wears glasses   . Pneumonia   . GERD (gastroesophageal reflux disease)   . Headache(784.0)   . History of radiation therapy   . 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 Cancer     squamous cell carcinoma right superior sulcus  . Lung cancer     right lung     History  Smoking status  . Former Smoker -- 1.00 packs/day for 42 years  . Types: Cigarettes  . Quit date: 03/28/1998  Smokeless tobacco  . Never Used      History  Alcohol Use  . Yes    Comment: occasional/ beer - no more than one beer in 24 hrs      No Known Allergies  Current Outpatient Prescriptions  Medication Sig Dispense Refill  . alfuzosin (UROXATRAL) 10 MG 24 hr tablet Take 10 mg by mouth daily with breakfast.      . cycloSPORINE (RESTASIS) 0.05 % ophthalmic emulsion 1 drop 2 (two) times daily.      . Multiple Vitamin (MULITIVITAMIN WITH MINERALS) TABS Take 1 tablet by mouth daily.      Marland Kitchen oxyCODONE-acetaminophen (PERCOCET/ROXICET) 5-325 MG per tablet Take 1 tablet by mouth every 6 (six) hours as needed for severe pain.  60 tablet  0  . polyethylene glycol (MIRALAX / GLYCOLAX) packet Take 17 g by mouth daily.       No current facility-administered medications for this visit.   Facility-Administered Medications Ordered in Other Visits  Medication Dose Route Frequency Provider Last Rate Last Dose  . fludeoxyglucose F - 18 (FDG) injection 10.4 milli Curie  10.4 milli Curie Intravenous Once PRN Medication Radiologist, MD           Physical Exam: BP 102/62  Pulse 86  Resp 16  Ht 6\' 2"  (1.88 m)  Wt 197 lb (89.359 kg)  BMI 25.28 kg/m2  SpO2 98%  General  appearance: alert and cooperative Neurologic: intact Heart: regular rate and rhythm, S1, S2 normal, no murmur, click, rub or gallop Lungs: clear to auscultation bilaterally Abdomen: soft, non-tender; bowel sounds normal; no masses,  no organomegaly Extremities: extremities normal, atraumatic, no cyanosis or edema and Homans sign is negative, no sign of DVT Wound: His right thoracotomy incision is well-healed, I do not appreciate cervical supraclavicular or axillary adenopathy, he has a palpable lipoma in the left chest that has been present for many years. He has no palpable point tenderness around the left chest wall in the area of question on chest x-ray With the known area on PET scan in the left neck he has no palpable masses or tenderness in this area  Diagnostic  Studies & Laboratory data:     Recent Radiology Findings:   Nm Pet Image Restag (ps) Skull Base To Thigh  10/18/2013   CLINICAL DATA:  Subsequent treatment strategy for non-small cell lung cancer. Patient status post scan radiation therapy in surgery to the right hemi thorax. Bb.  EXAM: NUCLEAR MEDICINE PET SKULL BASE TO THIGH  TECHNIQUE: 10.4 mCi F-18 FDG was injected intravenously. Full-ring PET imaging was performed from the skull base to thigh after the radiotracer. CT data was obtained and used for attenuation correction and anatomic localization.  FASTING BLOOD GLUCOSE:  Value: 106 mg/dl  COMPARISON:  CT 10/11/2013, PET-CT 04/25/2013  FINDINGS: NECK  There are 2 hypermetabolic foci within the musculature of the high left neck just inferior to the occiput and adjacent to the C3 vertebral body seen on images 30 and 38 respectively. No clear lesions identified on the CT portion the exam. There is some motion artifact.  CHEST  There is intense metabolic activity associated with the consolidation in the right upper lobe with central air bronchograms. Burtis Junes this to represent postradiation lung inflammation. Postoperative change along the right lower right lateral chest wall with reconstructive surgery.  There is an intensely hypermetabolic subcarinal lymph node measuring 14 mm (image 88) with SUV max 8.4.  Within the left hemi thorax there is an expansile lesion associated with the left posterior seventh rib which measures 3.1 cm and has intense metabolic activity with SUV max 14.5. There is a small soft tissue nodule more inferiorly along the left lateral chest wall between the ribs and the costal musculature measuring 13 mm (image 120, series 4) with SUV max 13.5.  There is a small right effusion  ABDOMEN/PELVIS  No abnormal hypermetabolic activity within the liver, pancreas, adrenal glands, or spleen. No hypermetabolic lymph nodes in the abdomen or pelvis.  SKELETON  No dditional lesions other than the left  rib lesion.  IMPRESSION: 1. Metastatic lung cancer with a expansile intensely hypermetabolic mass lesion lesion associated with the left posterior seventh rib. 2. Small nodular tissue metastasis to the left chest wall between the ribs and musculature. 3. Subcarinal nodal metastasis. 4. Potential soft tissue metastasis within the paraspinal musculature of the high left neck.   Electronically Signed   By: Suzy Bouchard M.D.   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 I plan to see him back in 4 weeks    Grace Isaac MD      [address] [place] [address] Office [phone]   Beeper [phone]  10/18/2013 4:42 PM

## 2013-10-20 NOTE — Progress Notes (Signed)
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  Primary site: Lung (Right)  Staging method: AJCC 7th Edition  Clinical: Stage IIIA (T3, N2, M0) signed by Curt Bears, MD on 05/09/2013 2:56 PM  Summary: Stage IIIA (T3, N2, M0)  Indication for treatment:  Adjuvant, curative post surgical       Radiation treatment dates:   09/25/2013-10/14/2013  Site/dose:   The right bronchial stump area/hilum was treated to 26 Gy in 13 fractions of 2 Gy  Beams/energy:   Three radiation fields were used including left anterior oblique, right posterior oblique, and dynamic conformal arc with 6 and 15 MV X-rays   Narrative: The patient tolerated radiation treatment relatively well.   He had minimal effects of radiation.  However, during his final few days of treatment, the patient was discovered to have a new hematogenous chest wall metastasis.  Plan: The patient has completed radiation treatment. The patient will return to radiation oncology clinic for routine followup in one month. I advised them to call or return sooner if they have any questions or concerns related to their recovery or treatment.  He will now proceed with systemic chemo for his new stage IV disease, pending PET ________________________________  Sheral Apley. Tammi Klippel, M.D.

## 2013-10-21 ENCOUNTER — Ambulatory Visit (HOSPITAL_BASED_OUTPATIENT_CLINIC_OR_DEPARTMENT_OTHER): Payer: Medicare Other

## 2013-10-21 ENCOUNTER — Other Ambulatory Visit: Payer: Self-pay | Admitting: Internal Medicine

## 2013-10-21 VITALS — BP 109/59 | HR 75 | Temp 98.0°F

## 2013-10-21 DIAGNOSIS — C3411 Malignant neoplasm of upper lobe, right bronchus or lung: Secondary | ICD-10-CM

## 2013-10-21 DIAGNOSIS — C782 Secondary malignant neoplasm of pleura: Secondary | ICD-10-CM

## 2013-10-21 DIAGNOSIS — C3410 Malignant neoplasm of upper lobe, unspecified bronchus or lung: Secondary | ICD-10-CM

## 2013-10-21 DIAGNOSIS — C3490 Malignant neoplasm of unspecified part of unspecified bronchus or lung: Secondary | ICD-10-CM

## 2013-10-21 DIAGNOSIS — Z5111 Encounter for antineoplastic chemotherapy: Secondary | ICD-10-CM

## 2013-10-21 DIAGNOSIS — C7952 Secondary malignant neoplasm of bone marrow: Secondary | ICD-10-CM

## 2013-10-21 DIAGNOSIS — C7951 Secondary malignant neoplasm of bone: Secondary | ICD-10-CM

## 2013-10-21 LAB — COMPREHENSIVE METABOLIC PANEL (CC13)
ALBUMIN: 3.1 g/dL — AB (ref 3.5–5.0)
ALT: 13 U/L (ref 0–55)
ANION GAP: 6 meq/L (ref 3–11)
AST: 16 U/L (ref 5–34)
Alkaline Phosphatase: 97 U/L (ref 40–150)
BILIRUBIN TOTAL: 0.31 mg/dL (ref 0.20–1.20)
BUN: 14.7 mg/dL (ref 7.0–26.0)
CALCIUM: 9.2 mg/dL (ref 8.4–10.4)
CHLORIDE: 109 meq/L (ref 98–109)
CO2: 24 meq/L (ref 22–29)
Creatinine: 0.8 mg/dL (ref 0.7–1.3)
GLUCOSE: 115 mg/dL (ref 70–140)
Potassium: 4 mEq/L (ref 3.5–5.1)
Sodium: 139 mEq/L (ref 136–145)
Total Protein: 6.5 g/dL (ref 6.4–8.3)

## 2013-10-21 LAB — CBC WITH DIFFERENTIAL/PLATELET
BASO%: 0.4 % (ref 0.0–2.0)
Basophils Absolute: 0 10*3/uL (ref 0.0–0.1)
EOS ABS: 0.1 10*3/uL (ref 0.0–0.5)
EOS%: 2.1 % (ref 0.0–7.0)
HEMATOCRIT: 36.2 % — AB (ref 38.4–49.9)
HGB: 12.1 g/dL — ABNORMAL LOW (ref 13.0–17.1)
LYMPH#: 0.6 10*3/uL — AB (ref 0.9–3.3)
LYMPH%: 10.9 % — AB (ref 14.0–49.0)
MCH: 32.5 pg (ref 27.2–33.4)
MCHC: 33.4 g/dL (ref 32.0–36.0)
MCV: 97.3 fL (ref 79.3–98.0)
MONO#: 0.7 10*3/uL (ref 0.1–0.9)
MONO%: 12.1 % (ref 0.0–14.0)
NEUT%: 74.5 % (ref 39.0–75.0)
NEUTROS ABS: 4.1 10*3/uL (ref 1.5–6.5)
PLATELETS: 286 10*3/uL (ref 140–400)
RBC: 3.72 10*6/uL — ABNORMAL LOW (ref 4.20–5.82)
RDW: 14.5 % (ref 11.0–14.6)
WBC: 5.5 10*3/uL (ref 4.0–10.3)

## 2013-10-21 MED ORDER — ONDANSETRON 16 MG/50ML IVPB (CHCC)
16.0000 mg | Freq: Once | INTRAVENOUS | Status: AC
  Administered 2013-10-21: 16 mg via INTRAVENOUS

## 2013-10-21 MED ORDER — PACLITAXEL PROTEIN-BOUND CHEMO INJECTION 100 MG
100.0000 mg/m2 | Freq: Once | INTRAVENOUS | Status: AC
  Administered 2013-10-21: 225 mg via INTRAVENOUS
  Filled 2013-10-21: qty 45

## 2013-10-21 MED ORDER — DEXAMETHASONE SODIUM PHOSPHATE 20 MG/5ML IJ SOLN
INTRAMUSCULAR | Status: AC
  Filled 2013-10-21: qty 5

## 2013-10-21 MED ORDER — SODIUM CHLORIDE 0.9 % IV SOLN
548.0000 mg | Freq: Once | INTRAVENOUS | Status: AC
  Administered 2013-10-21: 550 mg via INTRAVENOUS
  Filled 2013-10-21: qty 55

## 2013-10-21 MED ORDER — SODIUM CHLORIDE 0.9 % IV SOLN
Freq: Once | INTRAVENOUS | Status: AC
  Administered 2013-10-21: 15:00:00 via INTRAVENOUS

## 2013-10-21 MED ORDER — DEXAMETHASONE SODIUM PHOSPHATE 20 MG/5ML IJ SOLN
20.0000 mg | Freq: Once | INTRAMUSCULAR | Status: AC
  Administered 2013-10-21: 20 mg via INTRAVENOUS

## 2013-10-21 NOTE — Patient Instructions (Signed)
[facility] Discharge Instructions for Patients Receiving Chemotherapy  Today you received the following chemotherapy agents: Abraxane, carboplatin   To help prevent nausea and vomiting after your treatment, we encourage you to take your nausea medication as prescribed.    If you develop nausea and vomiting that is not controlled by your nausea medication, call the clinic.   BELOW ARE SYMPTOMS THAT SHOULD BE REPORTED IMMEDIATELY:  *FEVER GREATER THAN 100.5 F  *CHILLS WITH OR WITHOUT FEVER  NAUSEA AND VOMITING THAT IS NOT CONTROLLED WITH YOUR NAUSEA MEDICATION  *UNUSUAL SHORTNESS OF BREATH  *UNUSUAL BRUISING OR BLEEDING  TENDERNESS IN MOUTH AND THROAT WITH OR WITHOUT PRESENCE OF ULCERS  *URINARY PROBLEMS  *BOWEL PROBLEMS  UNUSUAL RASH Items with * indicate a potential emergency and should be followed up as soon as possible.  Feel free to call the clinic you have any questions or concerns. The clinic phone number is [phone].   Nanoparticle Albumin-Bound Paclitaxel injection What is this medicine? NANOPARTICLE ALBUMIN-BOUND PACLITAXEL (Na no PAHR ti kuhl al BYOO muhn-bound PAK li TAX el) is a chemotherapy drug. It targets fast dividing cells, like cancer cells, and causes these cells to die. This medicine is used to treat advanced breast cancer and advanced lung cancer. This medicine may be used for other purposes; ask your health care provider or pharmacist if you have questions. COMMON BRAND NAME(S): Abraxane What should I tell my health care provider before I take this medicine? They need to know if you have any of these conditions: -kidney disease -liver disease -low blood counts, like low platelets, red blood cells, or white blood cells -recent or ongoing radiation therapy -an unusual or allergic reaction to paclitaxel, albumin, other chemotherapy, other medicines, foods, dyes, or preservatives -pregnant or trying to get  pregnant -breast-feeding How should I use this medicine? This drug is given as an infusion into a vein. It is administered in a hospital or clinic by a specially trained health care professional. Talk to your pediatrician regarding the use of this medicine in children. Special care may be needed. Overdosage: If you think you have taken too much of this medicine contact a poison control center or emergency room at once. NOTE: This medicine is only for you. Do not share this medicine with others. What if I miss a dose? It is important not to miss your dose. Call your doctor or health care professional if you are unable to keep an appointment. What may interact with this medicine? -cyclosporine -diazepam -ketoconazole -medicines to increase blood counts like filgrastim, pegfilgrastim, sargramostim -other chemotherapy drugs like cisplatin, doxorubicin, epirubicin, etoposide, teniposide, vincristine -quinidine -testosterone -vaccines -verapamil Talk to your doctor or health care professional before taking any of these medicines: -acetaminophen -aspirin -ibuprofen -ketoprofen -naproxen This list may not describe all possible interactions. Give your health care provider a list of all the medicines, herbs, non-prescription drugs, or dietary supplements you use. Also tell them if you smoke, drink alcohol, or use illegal drugs. Some items may interact with your medicine. What should I watch for while using this medicine? Your condition will be monitored carefully while you are receiving this medicine. You will need important blood work done while you are taking this medicine. This drug may make you feel generally unwell. This is not uncommon, as chemotherapy can affect healthy cells as well as cancer cells. Report any side effects. Continue your course of treatment even though you feel ill unless your doctor tells you to stop. In  some cases, you may be given additional medicines to help with side  effects. Follow all directions for their use. Call your doctor or health care professional for advice if you get a fever, chills or sore throat, or other symptoms of a cold or flu. Do not treat yourself. This drug decreases your body's ability to fight infections. Try to avoid being around people who are sick. This medicine may increase your risk to bruise or bleed. Call your doctor or health care professional if you notice any unusual bleeding. Be careful brushing and flossing your teeth or using a toothpick because you may get an infection or bleed more easily. If you have any dental work done, tell your dentist you are receiving this medicine. Avoid taking products that contain aspirin, acetaminophen, ibuprofen, naproxen, or ketoprofen unless instructed by your doctor. These medicines may hide a fever. Do not become pregnant while taking this medicine. Women should inform their doctor if they wish to become pregnant or think they might be pregnant. There is a potential for serious side effects to an unborn child. Talk to your health care professional or pharmacist for more information. Do not breast-feed an infant while taking this medicine. Men are advised not to father a child while receiving this medicine. What side effects may I notice from receiving this medicine? Side effects that you should report to your doctor or health care professional as soon as possible: -allergic reactions like skin rash, itching or hives, swelling of the face, lips, or tongue -low blood counts - This drug may decrease the number of white blood cells, red blood cells and platelets. You may be at increased risk for infections and bleeding. -signs of infection - fever or chills, cough, sore throat, pain or difficulty passing urine -signs of decreased platelets or bleeding - bruising, pinpoint red spots on the skin, black, tarry stools, nosebleeds -signs of decreased red blood cells - unusually weak or tired, fainting  spells, lightheadedness -breathing problems -changes in vision -chest pain -high or low blood pressure -mouth sores -nausea and vomiting -pain, swelling, redness or irritation at the injection site -pain, tingling, numbness in the hands or feet -slow or irregular heartbeat -swelling of the ankle, feet, hands Side effects that usually do not require medical attention (report to your doctor or health care professional if they continue or are bothersome): -aches, pains -changes in the color of fingernails -diarrhea -hair loss -loss of appetite This list may not describe all possible side effects. Call your doctor for medical advice about side effects. You may report side effects to FDA at 1-800-FDA-1088. Where should I keep my medicine? This drug is given in a hospital or clinic and will not be stored at home. NOTE: This sheet is a summary. It may not cover all possible information. If you have questions about this medicine, talk to your doctor, pharmacist, or health care provider.  2015, Elsevier/Gold Standard. (2012-05-07 16:48:50)

## 2013-10-22 ENCOUNTER — Telehealth: Payer: Self-pay | Admitting: *Deleted

## 2013-10-22 ENCOUNTER — Other Ambulatory Visit: Payer: Self-pay | Admitting: *Deleted

## 2013-10-22 ENCOUNTER — Encounter: Payer: Self-pay | Admitting: Internal Medicine

## 2013-10-22 DIAGNOSIS — C3411 Malignant neoplasm of upper lobe, right bronchus or lung: Secondary | ICD-10-CM

## 2013-10-22 MED ORDER — LORAZEPAM 1 MG PO TABS: ORAL_TABLET | ORAL | Status: DC

## 2013-10-22 NOTE — Telephone Encounter (Signed)
Message printed and to Dr. Julien Nordmann for review.

## 2013-10-22 NOTE — Telephone Encounter (Signed)
Message printed, placed with first message of today for provider review.

## 2013-10-22 NOTE — Telephone Encounter (Signed)
Called Effie Shy for chemotherapy F/U.  Patient is doing well.  Denies n/v.  Denies any new side effects or symptoms.  Bowel and bladder is functioning well.  Eating and drinking well and I instructed to drink 64 oz minimum daily or at least the day before, of and after treatment.  Denies questions at this time and encouraged to call if needed.  Reviewed how to call after hours in the case of an emergency.

## 2013-10-22 NOTE — Addendum Note (Signed)
Addended by: Cherylynn Ridges on: 10/22/2013 01:51 PM   Modules accepted: Orders

## 2013-10-22 NOTE — Telephone Encounter (Signed)
Verbal order received and read back from Dr. Julien Nordmann for Port-a-cath placement by Dr. Servando Snare.

## 2013-10-22 NOTE — Telephone Encounter (Signed)
Verbal order received and read back from Dr. Julien Nordmann for lorazepam 1 mg take 1 hour before MRI.

## 2013-10-22 NOTE — Telephone Encounter (Signed)
Message copied by Cherylynn Ridges on Tue Oct 22, 2013  1:42 PM ------      Message from: Christa See      Created: Mon Oct 21, 2013  4:43 PM      Regarding: Chemo f/u call      Contact: [phone]       Pt had first time abraxne on Monday. MD is mohamed. Pt has previously had taxol/carbo. Thanks! Kristen  ------

## 2013-10-23 ENCOUNTER — Encounter (HOSPITAL_COMMUNITY): Payer: Self-pay | Admitting: Pharmacy Technician

## 2013-10-24 ENCOUNTER — Encounter (HOSPITAL_COMMUNITY): Payer: Self-pay

## 2013-10-24 ENCOUNTER — Encounter (HOSPITAL_COMMUNITY)
Admission: RE | Admit: 2013-10-24 | Discharge: 2013-10-24 | Disposition: A | Discharge status: Home / Self Care | Payer: Medicare Other | Source: Ambulatory Visit | Attending: Cardiothoracic Surgery | Admitting: Cardiothoracic Surgery

## 2013-10-24 ENCOUNTER — Ambulatory Visit: Payer: Medicare Other | Admitting: Radiation Oncology

## 2013-10-24 VITALS — BP 108/61 | HR 88 | Temp 98.5°F | Resp 16 | Ht 74.0 in | Wt 198.6 lb

## 2013-10-24 DIAGNOSIS — Z01812 Encounter for preprocedural laboratory examination: Secondary | ICD-10-CM | POA: Diagnosis present

## 2013-10-24 DIAGNOSIS — Z01818 Encounter for other preprocedural examination: Secondary | ICD-10-CM | POA: Diagnosis not present

## 2013-10-24 DIAGNOSIS — C3411 Malignant neoplasm of upper lobe, right bronchus or lung: Secondary | ICD-10-CM

## 2013-10-24 HISTORY — DX: Personal history of other infectious and parasitic diseases: Z86.19

## 2013-10-24 HISTORY — DX: Frequency of micturition: R35.0

## 2013-10-24 HISTORY — DX: Pain in unspecified joint: M25.50

## 2013-10-24 HISTORY — DX: Encounter for antineoplastic chemotherapy: Z51.11

## 2013-10-24 LAB — COMPREHENSIVE METABOLIC PANEL
ALT: 25 U/L (ref 0–53)
AST: 25 U/L (ref 0–37)
Albumin: 3.4 g/dL — ABNORMAL LOW (ref 3.5–5.2)
Alkaline Phosphatase: 107 U/L (ref 39–117)
Anion gap: 13 (ref 5–15)
BUN: 15 mg/dL (ref 6–23)
CO2: 26 mEq/L (ref 19–32)
Calcium: 9 mg/dL (ref 8.4–10.5)
Chloride: 102 mEq/L (ref 96–112)
Creatinine, Ser: 0.82 mg/dL (ref 0.50–1.35)
GFR calc Af Amer: >90 mL/min (ref >90)
GFR calc non Af Amer: 86 mL/min — ABNORMAL LOW (ref >90)
Glucose, Bld: 96 mg/dL (ref 70–99)
Potassium: 4.4 mEq/L (ref 3.7–5.3)
Sodium: 141 mEq/L (ref 137–147)
Total Bilirubin: 0.6 mg/dL (ref 0.3–1.2)
Total Protein: 6.9 g/dL (ref 6.0–8.3)

## 2013-10-24 LAB — CBC WITH DIFFERENTIAL/PLATELET
Basophils Absolute: 0 10*3/uL (ref 0.0–0.1)
Basophils Relative: 0 % (ref 0–1)
Eosinophils Absolute: 0.1 10*3/uL (ref 0.0–0.7)
Eosinophils Relative: 1 % (ref 0–5)
HCT: 38.3 % — ABNORMAL LOW (ref 39.0–52.0)
Hemoglobin: 12.4 g/dL — ABNORMAL LOW (ref 13.0–17.0)
Lymphocytes Relative: 10 % — ABNORMAL LOW (ref 12–46)
Lymphs Abs: 0.5 10*3/uL — ABNORMAL LOW (ref 0.7–4.0)
MCH: 32 pg (ref 26.0–34.0)
MCHC: 32.4 g/dL (ref 30.0–36.0)
MCV: 99 fL (ref 78.0–100.0)
Monocytes Absolute: 0.3 10*3/uL (ref 0.1–1.0)
Monocytes Relative: 7 % (ref 3–12)
Neutro Abs: 3.7 10*3/uL (ref 1.7–7.7)
Neutrophils Relative %: 82 % — ABNORMAL HIGH (ref 43–77)
Platelets: 248 10*3/uL (ref 150–400)
RBC: 3.87 MIL/uL — ABNORMAL LOW (ref 4.22–5.81)
RDW: 14 % (ref 11.5–15.5)
WBC: 4.5 10*3/uL (ref 4.0–10.5)

## 2013-10-24 LAB — PROTIME-INR
INR: 0.95 (ref 0.00–1.49)
Prothrombin Time: 12.7 seconds (ref 11.6–15.2)

## 2013-10-24 LAB — APTT: aPTT: 30 seconds (ref 24–37)

## 2013-10-24 NOTE — Progress Notes (Addendum)
Pt doesn't have a cardiologist  Stress test done around 2012-report under media tab  Denies ever having an echo/heart cath  Medical Md is [place]  EKG in epic from 07-26-13

## 2013-10-24 NOTE — Pre-Procedure Instructions (Signed)
ALFONSE GARRINGER  10/24/2013   Your procedure is scheduled on:  Wed, Aug 5 @ 8:30 AM  Report to [facility]  at 6:30 AM.  Call this number if you have problems the morning of surgery: [phone]   Remember:   Do not eat food or drink liquids after midnight.   Take these medicines the morning of surgery with A SIP OF WATER: Uroxatral(Alfuzosin),Eye Drops,Ativan(Lorazepam-if needed),and Pain Pill(if needed)                No Goody's,BC's,Aleve,Aspirin,Ibuprofen,Fish Oil,or any Herbal Medications   Do not wear jewelry  Do not wear lotions, powders, or colognes. You may wear deodorant.  Men may shave face and neck.  Do not bring valuables to the hospital.  [facility] is not responsible                  for any belongings or valuables.               Contacts, dentures or bridgework may not be worn into surgery.  Leave suitcase in the car. After surgery it may be brought to your room.  For patients admitted to the hospital, discharge time is determined by your                treatment team.               Patients discharged the day of surgery will not be allowed to drive  home.    Special Instructions:  [facility] - Preparing for Surgery  Before surgery, you can play an important role.  Because skin is not sterile, your skin needs to be as free of germs as possible.  You can reduce the number of germs on you skin by washing with CHG (chlorahexidine gluconate) soap before surgery.  CHG is an antiseptic cleaner which kills germs and bonds with the skin to continue killing germs even after washing.  Please DO NOT use if you have an allergy to CHG or antibacterial soaps.  If your skin becomes reddened/irritated stop using the CHG and inform your nurse when you arrive at Short Stay.  Do not shave (including legs and underarms) for at least 48 hours prior to the first CHG shower.  You may shave your face.  Please follow these instructions carefully:   1.  Shower with CHG Soap the  night before surgery and the                                morning of Surgery.  2.  If you choose to wash your hair, wash your hair first as usual with your       normal shampoo.  3.  After you shampoo, rinse your hair and body thoroughly to remove the                      Shampoo.  4.  Use CHG as you would any other liquid soap.  You can apply chg directly       to the skin and wash gently with scrungie or a clean washcloth.  5.  Apply the CHG Soap to your body ONLY FROM THE NECK DOWN.        Do not use on open wounds or open sores.  Avoid contact with your eyes,       ears, mouth and genitals (private parts).  Wash  genitals (private parts)       with your normal soap.  6.  Wash thoroughly, paying special attention to the area where your surgery        will be performed.  7.  Thoroughly rinse your body with warm water from the neck down.  8.  DO NOT shower/wash with your normal soap after using and rinsing off       the CHG Soap.  9.  Pat yourself dry with a clean towel.            10.  Wear clean pajamas.            11.  Place clean sheets on your bed the night of your first shower and do not        sleep with pets.  Day of Surgery  Do not apply any lotions/deoderants the morning of surgery.  Please wear clean clothes to the hospital/surgery center.     Please read over the following fact sheets that you were given: Pain Booklet, Coughing and Deep Breathing and Surgical Site Infection Prevention

## 2013-10-27 NOTE — Addendum Note (Signed)
Encounter addended by: Lora Paula, MD on: 10/27/2013  1:46 PM<BR>     Documentation filed: Notes Section

## 2013-10-28 ENCOUNTER — Ambulatory Visit (HOSPITAL_COMMUNITY)
Admission: RE | Admit: 2013-10-28 | Discharge: 2013-10-28 | Disposition: A | Discharge status: Home / Self Care | Payer: Medicare Other | Source: Ambulatory Visit | Attending: Internal Medicine | Admitting: Internal Medicine

## 2013-10-28 ENCOUNTER — Ambulatory Visit (HOSPITAL_BASED_OUTPATIENT_CLINIC_OR_DEPARTMENT_OTHER): Payer: Medicare Other

## 2013-10-28 ENCOUNTER — Other Ambulatory Visit (HOSPITAL_BASED_OUTPATIENT_CLINIC_OR_DEPARTMENT_OTHER): Payer: Medicare Other

## 2013-10-28 DIAGNOSIS — G319 Degenerative disease of nervous system, unspecified: Secondary | ICD-10-CM | POA: Diagnosis not present

## 2013-10-28 DIAGNOSIS — C3490 Malignant neoplasm of unspecified part of unspecified bronchus or lung: Secondary | ICD-10-CM | POA: Diagnosis present

## 2013-10-28 DIAGNOSIS — Z5111 Encounter for antineoplastic chemotherapy: Secondary | ICD-10-CM

## 2013-10-28 DIAGNOSIS — E236 Other disorders of pituitary gland: Secondary | ICD-10-CM | POA: Diagnosis not present

## 2013-10-28 DIAGNOSIS — C3410 Malignant neoplasm of upper lobe, unspecified bronchus or lung: Secondary | ICD-10-CM

## 2013-10-28 DIAGNOSIS — C3411 Malignant neoplasm of upper lobe, right bronchus or lung: Secondary | ICD-10-CM

## 2013-10-28 DIAGNOSIS — C3492 Malignant neoplasm of unspecified part of left bronchus or lung: Secondary | ICD-10-CM

## 2013-10-28 DIAGNOSIS — C782 Secondary malignant neoplasm of pleura: Secondary | ICD-10-CM

## 2013-10-28 LAB — CBC WITH DIFFERENTIAL/PLATELET
BASO%: 0.7 % (ref 0.0–2.0)
Basophils Absolute: 0 10*3/uL (ref 0.0–0.1)
EOS ABS: 0.1 10*3/uL (ref 0.0–0.5)
EOS%: 3.3 % (ref 0.0–7.0)
HCT: 35 % — ABNORMAL LOW (ref 38.4–49.9)
HGB: 11.7 g/dL — ABNORMAL LOW (ref 13.0–17.1)
LYMPH#: 0.5 10*3/uL — AB (ref 0.9–3.3)
LYMPH%: 15.1 % (ref 14.0–49.0)
MCH: 33 pg (ref 27.2–33.4)
MCHC: 33.5 g/dL (ref 32.0–36.0)
MCV: 98.8 fL — ABNORMAL HIGH (ref 79.3–98.0)
MONO#: 0.3 10*3/uL (ref 0.1–0.9)
MONO%: 7.5 % (ref 0.0–14.0)
NEUT#: 2.5 10*3/uL (ref 1.5–6.5)
NEUT%: 73.4 % (ref 39.0–75.0)
Platelets: 228 10*3/uL (ref 140–400)
RBC: 3.54 10*6/uL — AB (ref 4.20–5.82)
RDW: 13.8 % (ref 11.0–14.6)
WBC: 3.4 10*3/uL — ABNORMAL LOW (ref 4.0–10.3)

## 2013-10-28 LAB — COMPREHENSIVE METABOLIC PANEL (CC13)
ALBUMIN: 3.1 g/dL — AB (ref 3.5–5.0)
ALT: 50 U/L (ref 0–55)
ANION GAP: 7 meq/L (ref 3–11)
AST: 41 U/L — ABNORMAL HIGH (ref 5–34)
Alkaline Phosphatase: 130 U/L (ref 40–150)
BILIRUBIN TOTAL: 0.49 mg/dL (ref 0.20–1.20)
BUN: 14.8 mg/dL (ref 7.0–26.0)
CHLORIDE: 108 meq/L (ref 98–109)
CO2: 26 meq/L (ref 22–29)
Calcium: 9.3 mg/dL (ref 8.4–10.4)
Creatinine: 0.8 mg/dL (ref 0.7–1.3)
Glucose: 89 mg/dl (ref 70–140)
POTASSIUM: 3.9 meq/L (ref 3.5–5.1)
SODIUM: 141 meq/L (ref 136–145)
TOTAL PROTEIN: 6.4 g/dL (ref 6.4–8.3)

## 2013-10-28 MED ORDER — DEXAMETHASONE SODIUM PHOSPHATE 10 MG/ML IJ SOLN
10.0000 mg | Freq: Once | INTRAMUSCULAR | Status: AC
  Administered 2013-10-28: 10 mg via INTRAVENOUS

## 2013-10-28 MED ORDER — GADOBENATE DIMEGLUMINE 529 MG/ML IV SOLN
20.0000 mL | Freq: Once | INTRAVENOUS | Status: AC | PRN
  Administered 2013-10-28: 19 mL via INTRAVENOUS

## 2013-10-28 MED ORDER — DEXAMETHASONE SODIUM PHOSPHATE 10 MG/ML IJ SOLN
INTRAMUSCULAR | Status: AC
  Filled 2013-10-28: qty 1

## 2013-10-28 MED ORDER — SODIUM CHLORIDE 0.9 % IV SOLN
Freq: Once | INTRAVENOUS | Status: AC
  Administered 2013-10-28: 12:00:00 via INTRAVENOUS

## 2013-10-28 MED ORDER — PACLITAXEL PROTEIN-BOUND CHEMO INJECTION 100 MG
100.0000 mg/m2 | Freq: Once | INTRAVENOUS | Status: AC
  Administered 2013-10-28: 225 mg via INTRAVENOUS
  Filled 2013-10-28: qty 45

## 2013-10-28 MED ORDER — ONDANSETRON 8 MG/NS 50 ML IVPB
INTRAVENOUS | Status: AC
  Filled 2013-10-28: qty 8

## 2013-10-28 MED ORDER — ONDANSETRON 8 MG/50ML IVPB (CHCC)
8.0000 mg | Freq: Once | INTRAVENOUS | Status: AC
  Administered 2013-10-28: 8 mg via INTRAVENOUS

## 2013-10-29 ENCOUNTER — Telehealth: Payer: Self-pay | Admitting: *Deleted

## 2013-10-29 ENCOUNTER — Encounter: Payer: Self-pay | Admitting: Internal Medicine

## 2013-10-29 MED ORDER — DEXTROSE 5 % IV SOLN
1.5000 g | INTRAVENOUS | Status: AC
  Administered 2013-10-30: 1.5 g via INTRAVENOUS
  Filled 2013-10-29: qty 1.5

## 2013-10-29 NOTE — Telephone Encounter (Signed)
Reviewed pt's email message from [place] regarding the chemo that pt received yesterday.  Pt wanted to know if he should receive Abraxane and Carboplatin chemo drugs yesterday.   Spoke with pt and informed pt re:  Pt only gets Carboplatin on day 1 of each cycle of chemo, and Abraxane weekly as per care plan and as per Dr. Worthy Flank notes.  Pt voiced understanding.

## 2013-10-30 ENCOUNTER — Ambulatory Visit (HOSPITAL_COMMUNITY): Payer: Medicare Other

## 2013-10-30 ENCOUNTER — Encounter (HOSPITAL_COMMUNITY)
Admission: RE | Disposition: A | Discharge status: Home / Self Care | Payer: Self-pay | Source: Ambulatory Visit | Attending: Cardiothoracic Surgery

## 2013-10-30 ENCOUNTER — Ambulatory Visit (HOSPITAL_COMMUNITY): Payer: Medicare Other | Admitting: Anesthesiology

## 2013-10-30 ENCOUNTER — Ambulatory Visit (HOSPITAL_COMMUNITY)
Admission: RE | Admit: 2013-10-30 | Discharge: 2013-10-30 | Disposition: A | Discharge status: Home / Self Care | Payer: Medicare Other | Source: Ambulatory Visit | Attending: Cardiothoracic Surgery | Admitting: Cardiothoracic Surgery

## 2013-10-30 ENCOUNTER — Encounter (HOSPITAL_COMMUNITY): Payer: Self-pay | Admitting: *Deleted

## 2013-10-30 ENCOUNTER — Telehealth: Payer: Self-pay | Admitting: Dietician

## 2013-10-30 ENCOUNTER — Encounter (HOSPITAL_COMMUNITY): Payer: Medicare Other | Admitting: Anesthesiology

## 2013-10-30 DIAGNOSIS — C3410 Malignant neoplasm of upper lobe, unspecified bronchus or lung: Secondary | ICD-10-CM

## 2013-10-30 DIAGNOSIS — K219 Gastro-esophageal reflux disease without esophagitis: Secondary | ICD-10-CM | POA: Insufficient documentation

## 2013-10-30 DIAGNOSIS — C3411 Malignant neoplasm of upper lobe, right bronchus or lung: Secondary | ICD-10-CM

## 2013-10-30 DIAGNOSIS — E785 Hyperlipidemia, unspecified: Secondary | ICD-10-CM | POA: Insufficient documentation

## 2013-10-30 DIAGNOSIS — F17200 Nicotine dependence, unspecified, uncomplicated: Secondary | ICD-10-CM | POA: Diagnosis not present

## 2013-10-30 DIAGNOSIS — N400 Enlarged prostate without lower urinary tract symptoms: Secondary | ICD-10-CM | POA: Diagnosis not present

## 2013-10-30 DIAGNOSIS — C782 Secondary malignant neoplasm of pleura: Secondary | ICD-10-CM

## 2013-10-30 HISTORY — PX: PORTACATH PLACEMENT: SHX2246

## 2013-10-30 SURGERY — INSERTION, TUNNELED CENTRAL VENOUS DEVICE, WITH PORT
Anesthesia: Monitor Anesthesia Care | Site: Chest | Laterality: Left

## 2013-10-30 MED ORDER — FENTANYL CITRATE 0.05 MG/ML IJ SOLN
INTRAMUSCULAR | Status: AC
  Filled 2013-10-30: qty 5

## 2013-10-30 MED ORDER — PROPOFOL 10 MG/ML IV BOLUS
INTRAVENOUS | Status: AC
  Filled 2013-10-30: qty 20

## 2013-10-30 MED ORDER — LIDOCAINE HCL (PF) 1 % IJ SOLN
INTRAMUSCULAR | Status: AC
  Filled 2013-10-30: qty 30

## 2013-10-30 MED ORDER — MIDAZOLAM HCL 5 MG/5ML IJ SOLN
INTRAMUSCULAR | Status: DC | PRN
  Administered 2013-10-30: 2 mg via INTRAVENOUS

## 2013-10-30 MED ORDER — FENTANYL CITRATE 0.05 MG/ML IJ SOLN: 25.0000 ug | INTRAMUSCULAR | Status: DC | PRN

## 2013-10-30 MED ORDER — SODIUM CHLORIDE 0.9 % IR SOLN
Status: DC | PRN
  Administered 2013-10-30: 1000 mL

## 2013-10-30 MED ORDER — ARTIFICIAL TEARS OP OINT
TOPICAL_OINTMENT | OPHTHALMIC | Status: AC
  Filled 2013-10-30: qty 3.5

## 2013-10-30 MED ORDER — MIDAZOLAM HCL 2 MG/2ML IJ SOLN
INTRAMUSCULAR | Status: AC
  Filled 2013-10-30: qty 2

## 2013-10-30 MED ORDER — EPHEDRINE SULFATE 50 MG/ML IJ SOLN
INTRAMUSCULAR | Status: AC
  Filled 2013-10-30: qty 1

## 2013-10-30 MED ORDER — SODIUM CHLORIDE 0.9 % IJ SOLN
INTRAMUSCULAR | Status: AC
  Filled 2013-10-30: qty 10

## 2013-10-30 MED ORDER — PROMETHAZINE HCL 25 MG/ML IJ SOLN: 6.2500 mg | INTRAMUSCULAR | Status: DC | PRN

## 2013-10-30 MED ORDER — SODIUM CHLORIDE 0.9 % IR SOLN
Status: DC | PRN
  Administered 2013-10-30: 09:00:00

## 2013-10-30 MED ORDER — LIDOCAINE HCL (CARDIAC) 20 MG/ML IV SOLN
INTRAVENOUS | Status: DC | PRN
  Administered 2013-10-30: 20 mg via INTRAVENOUS

## 2013-10-30 MED ORDER — LIDOCAINE HCL (PF) 1 % IJ SOLN
INTRAMUSCULAR | Status: DC | PRN
  Administered 2013-10-30: 13 mL

## 2013-10-30 MED ORDER — FENTANYL CITRATE 0.05 MG/ML IJ SOLN
INTRAMUSCULAR | Status: DC | PRN
  Administered 2013-10-30 (×3): 50 ug via INTRAVENOUS

## 2013-10-30 MED ORDER — LACTATED RINGERS IV SOLN
INTRAVENOUS | Status: DC | PRN
  Administered 2013-10-30: 08:00:00 via INTRAVENOUS

## 2013-10-30 MED ORDER — LIDOCAINE HCL (CARDIAC) 20 MG/ML IV SOLN
INTRAVENOUS | Status: AC
  Filled 2013-10-30: qty 10

## 2013-10-30 MED ORDER — PHENYLEPHRINE 40 MCG/ML (10ML) SYRINGE FOR IV PUSH (FOR BLOOD PRESSURE SUPPORT)
PREFILLED_SYRINGE | INTRAVENOUS | Status: AC
  Filled 2013-10-30: qty 10

## 2013-10-30 MED ORDER — HEPARIN SOD (PORK) LOCK FLUSH 100 UNIT/ML IV SOLN
INTRAVENOUS | Status: DC | PRN
  Administered 2013-10-30: 400 [IU] via INTRAVENOUS

## 2013-10-30 MED ORDER — PROPOFOL 10 MG/ML IV BOLUS
INTRAVENOUS | Status: DC | PRN
  Administered 2013-10-30 (×8): 20 mg via INTRAVENOUS

## 2013-10-30 MED ORDER — HEPARIN SOD (PORK) LOCK FLUSH 100 UNIT/ML IV SOLN
INTRAVENOUS | Status: AC
  Filled 2013-10-30: qty 5

## 2013-10-30 MED ORDER — SUCCINYLCHOLINE CHLORIDE 20 MG/ML IJ SOLN
INTRAMUSCULAR | Status: AC
  Filled 2013-10-30: qty 1

## 2013-10-30 MED ORDER — HEPARIN SODIUM (PORCINE) 1000 UNIT/ML IJ SOLN
INTRAMUSCULAR | Status: AC
  Filled 2013-10-30: qty 1

## 2013-10-30 SURGICAL SUPPLY — 43 items
BAG DECANTER FOR FLEXI CONT (MISCELLANEOUS) ×2 IMPLANT
BLADE SURG 11 STRL SS (BLADE) ×2 IMPLANT
CANISTER SUCTION 2500CC (MISCELLANEOUS) ×2 IMPLANT
COVER PROBE W GEL 5X96 (DRAPES) ×2 IMPLANT
COVER SURGICAL LIGHT HANDLE (MISCELLANEOUS) ×2 IMPLANT
DERMABOND ADHESIVE PROPEN (GAUZE/BANDAGES/DRESSINGS) ×1
DERMABOND ADVANCED (GAUZE/BANDAGES/DRESSINGS) ×1
DERMABOND ADVANCED .7 DNX12 (GAUZE/BANDAGES/DRESSINGS) ×1 IMPLANT
DERMABOND ADVANCED .7 DNX6 (GAUZE/BANDAGES/DRESSINGS) ×1 IMPLANT
DRAPE C-ARM 42X72 X-RAY (DRAPES) ×2 IMPLANT
DRAPE CHEST BREAST 15X10 FENES (DRAPES) ×2 IMPLANT
ELECT CAUTERY BLADE 6.4 (BLADE) ×2 IMPLANT
ELECT REM PT RETURN 9FT ADLT (ELECTROSURGICAL) ×2
ELECTRODE REM PT RTRN 9FT ADLT (ELECTROSURGICAL) ×1 IMPLANT
GAUZE SPONGE 4X4 12PLY STRL (GAUZE/BANDAGES/DRESSINGS) ×2 IMPLANT
GLOVE BIO SURGEON STRL SZ 6.5 (GLOVE) ×4 IMPLANT
GLOVE BIOGEL PI IND STRL 6.5 (GLOVE) ×1 IMPLANT
GLOVE BIOGEL PI INDICATOR 6.5 (GLOVE) ×1
GOWN STRL REUS W/ TWL LRG LVL3 (GOWN DISPOSABLE) ×2 IMPLANT
GOWN STRL REUS W/TWL LRG LVL3 (GOWN DISPOSABLE) ×2
GUIDEWIRE UNCOATED ST S 7038 (WIRE) IMPLANT
INTRODUCER 13FR (MISCELLANEOUS) IMPLANT
INTRODUCER COOK 11FR (CATHETERS) IMPLANT
KIT BASIN OR (CUSTOM PROCEDURE TRAY) ×2 IMPLANT
KIT PORT POWER 9.6FR MRI PREA (Catheter) ×2 IMPLANT
KIT PORT POWER ISP 8FR (Catheter) IMPLANT
KIT POWER CATH 8FR (Catheter) IMPLANT
KIT ROOM TURNOVER OR (KITS) ×2 IMPLANT
NEEDLE 22X1 1/2 (OR ONLY) (NEEDLE) IMPLANT
NEEDLE HYPO 25GX1X1/2 BEV (NEEDLE) ×2 IMPLANT
NS IRRIG 1000ML POUR BTL (IV SOLUTION) ×2 IMPLANT
PACK GENERAL/GYN (CUSTOM PROCEDURE TRAY) ×2 IMPLANT
PAD ARMBOARD 7.5X6 YLW CONV (MISCELLANEOUS) ×4 IMPLANT
SET SHEATH INTRODUCER 10FR (MISCELLANEOUS) IMPLANT
SUT SILK 2 0 SH (SUTURE) ×2 IMPLANT
SUT VIC AB 3-0 SH 18 (SUTURE) ×2 IMPLANT
SUT VICRYL 4-0 PS2 18IN ABS (SUTURE) ×2 IMPLANT
SYR 20CC LL (SYRINGE) ×2 IMPLANT
SYR 5ML LUER SLIP (SYRINGE) IMPLANT
SYR CONTROL 10ML LL (SYRINGE) ×2 IMPLANT
TOWEL OR 17X24 6PK STRL BLUE (TOWEL DISPOSABLE) ×2 IMPLANT
TOWEL OR 17X26 10 PK STRL BLUE (TOWEL DISPOSABLE) ×2 IMPLANT
WATER STERILE IRR 1000ML POUR (IV SOLUTION) ×2 IMPLANT

## 2013-10-30 NOTE — Anesthesia Preprocedure Evaluation (Signed)
Anesthesia Evaluation  Patient identified by MRN, date of birth, ID band Patient awake    Reviewed: Allergy & Precautions, H&P , NPO status , Patient's Chart, lab work & pertinent test results  History of Anesthesia Complications Negative for: history of anesthetic complications  Airway Mallampati: II TM Distance: >3 FB Neck ROM: Full    Dental  (+) Partial Lower, Partial Upper, Dental Advisory Given   Pulmonary shortness of breath, former smoker,  Right pulmonary lesion breath sounds clear to auscultation        Cardiovascular negative cardio ROS  Rhythm:Regular Rate:Normal     Neuro/Psych  Headaches, negative psych ROS   GI/Hepatic Neg liver ROS, GERD-  Controlled,  Endo/Other  negative endocrine ROS  Renal/GU      Musculoskeletal negative musculoskeletal ROS (+)   Abdominal   Peds  Hematology  (+) anemia ,   Anesthesia Other Findings   Reproductive/Obstetrics                           Anesthesia Physical Anesthesia Plan  ASA: III  Anesthesia Plan: MAC   Post-op Pain Management:    Induction: Intravenous  Airway Management Planned: Natural Airway  Additional Equipment:   Intra-op Plan:   Post-operative Plan:   Informed Consent: I have reviewed the patients History and Physical, chart, labs and discussed the procedure including the risks, benefits and alternatives for the proposed anesthesia with the patient or authorized representative who has indicated his/her understanding and acceptance.     Plan Discussed with:   Anesthesia Plan Comments:         Anesthesia Quick Evaluation

## 2013-10-30 NOTE — Transfer of Care (Signed)
Immediate Anesthesia Transfer of Care Note  Patient: Terry Robles  Procedure(s) Performed: Procedure(s): INSERTION PORT-A-CATH WITH ULTRASOUND GUIDANCE AND FLUORO (Left)  Patient Location: PACU  Anesthesia Type:MAC  Level of Consciousness: awake, alert  and oriented  Airway & Oxygen Therapy: Patient Spontanous Breathing  Post-op Assessment: Report given to PACU RN and Post -op Vital signs reviewed and stable  Post vital signs: Reviewed and stable  Complications: No apparent anesthesia complications

## 2013-10-30 NOTE — Anesthesia Postprocedure Evaluation (Signed)
  Anesthesia Post-op Note  Patient: Terry Robles  Procedure(s) Performed: Procedure(s): INSERTION PORT-A-CATH WITH ULTRASOUND GUIDANCE AND FLUORO (Left)  Patient Location: PACU  Anesthesia Type:MAC  Level of Consciousness: awake and alert   Airway and Oxygen Therapy: Patient Spontanous Breathing  Post-op Pain: none  Post-op Assessment: Post-op Vital signs reviewed  Post-op Vital Signs: stable  Last Vitals:  Filed Vitals:   10/30/13 1100  BP: 119/66  Pulse: 68  Temp: 36.4 C  Resp: 16    Complications: No apparent anesthesia complications

## 2013-10-30 NOTE — Discharge Instructions (Signed)

## 2013-10-30 NOTE — H&P (Signed)
[address]       [place] [address]             [phone]      Pearley K Altadonna Hyde Medical Record #[id number] Date of Birth: 1941/02/27  Referring: No ref. provider found Primary Care: [name], MD  Chief Complaint:   POST OP FOLLOW UP 07/30/2013  OPERATIVE REPORT  PREOPERATIVE DIAGNOSIS: Non-small cell lung cancer, right upper lobe  with chest wall invasion.  POSTOPERATIVE DIAGNOSIS: Non-small cell lung cancer, right upper lobe  with chest wall invasion.  SURGICAL PROCEDURE: Bronchoscopy, mediastinoscopy, right posterior  lateral thoracotomy with right upper lobectomy, lymph node dissection,  and posterior approach to en bloc chest wall resection, ribs 2, 3, and  4.  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 . Follow up  Chest x-ray  suggests a destructive rib lesion in left chest this was confirmed with ct and bx proven recurrance. The patient denies any pain in this area. He has been recovering from his surgery well with decreasing right chest wall pain.    Past Medical History  Diagnosis Date  . Hypercholesteremia 05/14/2011  . BPH (benign prostatic hyperplasia) 05/14/2011    takes Uroxatral daily  . GERD (gastroesophageal reflux disease)   . 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 Cancer     squamous cell carcinoma right superior sulcus  . Lung cancer     right lung  . Shortness of breath     weather related  . Pneumonia 1965  . Headache(784.0)    occasionally  . Joint pain   . Maintenance chemotherapy   . Urinary frequency   . History of shingles      History  Smoking status  . Former Smoker -- 1.00 packs/day for 42 years  . Types: Cigarettes  Smokeless tobacco  . Never Used    Comment: quit smoking almost 63yrs ago    History  Alcohol Use  . Yes    Comment: occasionally beer(12 pk last a month)     No Known Allergies  Current Facility-Administered Medications  Medication Dose Route Frequency Provider Last Rate Last Dose  . cefUROXime (ZINACEF) 1.5 g in dextrose 5 % 50 mL IVPB  1.5 g Intravenous 60 min Pre-Op Grace Isaac, MD           Physical Exam: There were no vitals taken for this visit.  General appearance: alert and cooperative Neurologic: intact Heart: regular rate and rhythm, S1, S2 normal, no murmur, click, rub or gallop Lungs: clear to auscultation bilaterally Abdomen: soft, non-tender; bowel sounds normal; no masses,  no organomegaly Extremities: extremities normal, atraumatic, no cyanosis or edema and Homans sign is negative, no sign of DVT Wound: His right thoracotomy incision is well-healed, I do not appreciate cervical supraclavicular or axillary adenopathy, he has a palpable lipoma in the left chest that has been present for many years. He has no  palpable point tenderness around the left chest wall in the area of question on chest x-ray   Diagnostic Studies & Laboratory data:     Recent Radiology Findings:  Dg Chest 2 View  10/10/2013   CLINICAL DATA:  History right lung cancer, status post surgery 07/2013  EXAM: CHEST  2 VIEW  COMPARISON:  Chest radiograph dated 08/26/2013 CT chest dated 07/24/2013  FINDINGS: Postsurgical changes in the right upper hemithorax with volume loss and increased right apical capping/fluid.  Pleural-based lesion along the left lateral hemithorax, new/increased, with destruction of the left lateral 7th rib.  No pneumothorax.  The heart is normal in size.   Degenerative changes of the visualized thoracolumbar spine.  IMPRESSION: Postsurgical changes in the right upper hemithorax with increased right apical capping/fluid.  Pleural-based lesion along the left lateral hemithorax, new/increased, with destruction of the left lateral 7th rib.  Consider CT chest with contrast for further evaluation.  These results will be called to the ordering clinician or representative by the Radiologist Assistant, and communication documented in the PACS or zVision Dashboard.   Electronically Signed   By: Julian Hy M.D.   On: 10/10/2013 09:42   Ct Chest W Contrast  10/11/2013   CLINICAL DATA:  Followup metastatic lung carcinoma. Recent right thoracotomy. On going radiation therapy. Recently completed chemotherapy.  EXAM: CT CHEST WITH CONTRAST  TECHNIQUE: Multidetector CT imaging of the chest was performed during intravenous contrast administration.  CONTRAST:  59mL OMNIPAQUE IOHEXOL 300 MG/ML  SOLN  COMPARISON:  07/24/2013  FINDINGS: Patient has undergone right thoracotomy and right upper lobectomy since prior exam. Parenchymal consolidation and central air bronchograms is seen in the right posterior upper lung field, with surgical clips in the adjacent right hilum. 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 A small right pleural effusion is noted, however there is no evidence of associated pleural nodularity or chest wall mass. No suspicious pulmonary nodules or masses are seen involving either lung. Mild emphysema noted. No evidence of left-sided pleural effusion.  No hilar lymphadenopathy identified. Mild mediastinal adenopathy in the subcarinal region shows increased since previous study, with lymph node measuring 16 mm on image 27 compared with 7 mm previously. No other pathologically enlarged nodes are seen within the thorax.  A left posterior chest wall mass is seen with destruction of left posterior lateral seventh rib. This measures 3.6 x 2.7 cm on image 30.  This is new since previous study and consistent with a lytic bone metastasis. No other suspicious bone lesions seen within the thorax.  Both adrenal glands are normal in appearance. Tiny calcified gallstones again seen, however there is no evidence of cholecystitis. A subcutaneous lesion is seen in the left anterior chest wall measuring 1.7 x 5.3 cm which is stable and consistent with a sebaceous cyst.  IMPRESSION: Postop changes from interval right upper lobectomy. 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 Small right pleural effusion also noted.  Increased mild mediastinal lymphadenopathy in the subcarinal region, suspicious for metastatic disease.  New lytic bone metastasis with associated chest wall mass involving the left posterior seventh rib.   Electronically Signed   By: Earle Gell M.D.   On: 10/11/2013 14:39   .Nm Pet Image Restag (ps) Skull Base To Thigh  10/18/2013   CLINICAL DATA:  Subsequent treatment strategy for non-small cell lung cancer. Patient status post scan radiation therapy in surgery to the right hemi thorax. Bb.  EXAM:  NUCLEAR MEDICINE PET SKULL BASE TO THIGH  TECHNIQUE: 10.4 mCi F-18 FDG was injected intravenously. Full-ring PET imaging was performed from the skull base to thigh after the radiotracer. CT data was obtained and used for attenuation correction and anatomic localization.  FASTING BLOOD GLUCOSE:  Value: 106 mg/dl  COMPARISON:  CT 10/11/2013, PET-CT 04/25/2013  FINDINGS: NECK  There are 2 hypermetabolic foci within the musculature of the high left neck just inferior to the occiput and adjacent to the C3 vertebral body seen on images 30 and 38 respectively. No clear lesions identified on the CT portion the exam. There is some motion artifact.  CHEST  There is intense metabolic activity associated with the consolidation in the right upper lobe with central air bronchograms. Burtis Junes this to represent  postradiation lung inflammation. Postoperative change along the right lower right lateral chest wall with reconstructive surgery.  There is an intensely hypermetabolic subcarinal lymph node measuring 14 mm (image 88) with SUV max 8.4.  Within the left hemi thorax there is an expansile lesion associated with the left posterior seventh rib which measures 3.1 cm and has intense metabolic activity with SUV max 14.5. There is a small soft tissue nodule more inferiorly along the left lateral chest wall between the ribs and the costal musculature measuring 13 mm (image 120, series 4) with SUV max 13.5.  There is a small right effusion  ABDOMEN/PELVIS  No abnormal hypermetabolic activity within the liver, pancreas, adrenal glands, or spleen. No hypermetabolic lymph nodes in the abdomen or pelvis.  SKELETON  No dditional lesions other than the left rib lesion.  IMPRESSION: 1. Metastatic lung cancer with a expansile intensely hypermetabolic mass lesion lesion associated with the left posterior seventh rib. 2. Small nodular tissue metastasis to the left chest wall between the ribs and musculature. 3. Subcarinal nodal metastasis. 4. Potential soft tissue metastasis within the paraspinal musculature of the high left neck.   Electronically Signed   By: Suzy Bouchard M.D.   On: 10/18/2013 13:46   Mr Jeri Cos GY Contrast  10/28/2013   CLINICAL DATA:  Lung cancer staging.  EXAM: MRI HEAD WITHOUT AND WITH CONTRAST  TECHNIQUE: Multiplanar, multiecho pulse sequences of the brain and surrounding structures were obtained without and with intravenous contrast.  CONTRAST:  60mL MULTIHANCE GADOBENATE DIMEGLUMINE 529 MG/ML IV SOLN  COMPARISON:  05/14/2013 MR [facility]. PET scan 10/18/2013.  FINDINGS: No evidence for acute infarction, hemorrhage, mass lesion, hydrocephalus, or extra-axial fluid. Mild atrophy. Mild subcortical and periventricular T2 and FLAIR hyperintensities, likely chronic microvascular ischemic change. Flow  voids are maintained throughout the carotid, basilar, and vertebral arteries. There are no areas of chronic hemorrhage. Partial empty sella. No tonsillar herniation. Post infusion, no abnormal enhancement of the brain or meninges. Incidental RIGHT posterior temporal venous angioma. No acute findings in the orbits, sinuses, or mastoids. No osseous lesion.  Attention was directed to the LEFT skull base, soft tissue neck region posteriorly as questioned from prior PET (image 30 series 605). This area is incompletely evaluated as part of an MR brain, but there are no obvious masses in the suboccipital region as observed on today's study. If further delineation is desired, an MRI of the cervical spine with attention to this area, pre and postcontrast, using fat saturation techniques, it is recommended for further evaluation.  IMPRESSION: No acute intracranial abnormality.  No evidence for abnormal enhancement to suggest intracranial metastatic disease.   Electronically Signed   By: Michae Kava.D.  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 With plans to continue with chemotherapy after last dose of postoperative radiation. Now with recurrent disease on the opposite lung. For placement of portacath for chem access. The goals risks and alternatives of the planned surgical procedure portacath have been  discussed with the patient in detail. The risks of the procedure including death, infection, stroke, myocardial infarction, bleeding, blood transfusion pneumothorax have all been discussed specifically.  I have quoted Effie Shy a less then 1%% of perioperative mortality and a complication rate as high as 10 %. The patient's questions have been answered.HAMLIN DEVINE is willing  to proceed with the planned procedure.  Grace Isaac MD      [address] [place] [address] Office [phone]   Beeper [phone]  10/30/2013 6:27 AM

## 2013-10-30 NOTE — Progress Notes (Signed)
Report given to jamie rn as caregiver 

## 2013-10-30 NOTE — Brief Op Note (Signed)
      [address]       ,[place] [address]             [phone]      10/30/2013  9:18 AM  PATIENT:  Terry Robles  73 y.o. male  PRE-OPERATIVE DIAGNOSIS:   RUL LUNG CANCER  POST-OPERATIVE DIAGNOSIS:  RUL LUNG CANCER  PROCEDURE:  Procedure(s): INSERTION PORT-A-CATH WITH ULTRASOUND GUIDANCE AND FLUORO (Left)  SURGEON:  Surgeon(s) and Role:    * Grace Isaac, MD - Primary    ANESTHESIA:   local and MAC  EBL:  Total I/O In: 700 [I.V.:700] Out: -   BLOOD ADMINISTERED:none  DRAINS: none   LOCAL MEDICATIONS USED:  LIDOCAINE   SPECIMEN:  No Specimen  DISPOSITION OF SPECIMEN:  N/A  COUNTS:  YES   DICTATION: .Dragon Dictation  PLAN OF CARE: Discharge to home after PACU  PATIENT DISPOSITION:  PACU - hemodynamically stable.   Delay start of Pharmacological VTE agent (>24hrs) due to surgical blood loss or risk of bleeding: yes

## 2013-10-30 NOTE — Op Note (Signed)
NAME:  Terry Robles, Terry Robles NO.:  [id number]  MEDICAL RECORD NO.:  [id number]  LOCATION:  [facility]                         FACILITY:  [facility]  PHYSICIAN:  Lanelle Bal, MD    DATE OF BIRTH:  22-Aug-1940  DATE OF PROCEDURE:  10/30/2013 DATE OF DISCHARGE:                              OPERATIVE REPORT   PREOPERATIVE DIAGNOSIS:  Metastatic lung cancer.  POSTOPERATIVE DIAGNOSIS:  Metastatic lung cancer.  PROCEDURE PERFORMED:  Placement of left subclavian Port-A-Cath with ultrasound and fluoroscopic guidance.  SURGEON:  Lanelle Bal, MD.  BRIEF HISTORY:  The patient is a 73 year old male who had previously undergone a right upper lobectomy with chest wall resection after receiving a course of chemo and radiation; subsequently, developed a lytic lesion in the left chest ribs, and is now undergoing additional chemotherapy treatment on a weekly basis.  He was referred by Dr. Earlie Server for placement of a Port-A-Cath.  Risks and options of Port-A- Cath use and placement were discussed with the patient, and he was agreeable and signed informed consent.  DESCRIPTION OF PROCEDURE:  After appropriate time-out and with monitored anesthesia care, the patient's chest was prepped with Betadine and draped in sterile manner.  A 1% lidocaine was infiltrated in the left infraclavicular area.  A SonoSite vascular ultrasound was used to confirm location of the subclavian vein.  A 16-gauge needle was introduced into the subclavian vein with a single stick and easy blood return.  A guidewire was placed under fluoroscopic guidance into the superior vena cava and down into the inferior vena cava confirming its position.  Wire was brought back to the right atrial level.  Additional lidocaine was infiltrated over the left anterior chest wall and a subcutaneous pocket created for the 9.6-French preattached Port-A-Cath. The Port-A-Cath was tunneled between the subcutaneous pocket and the vein  insertion site.  A peel-away sheath over a dilator was placed over the wire into the superior vena cava.  The Port-A-Cath was trimmed to the appropriate length, and after being flushed with heparinized saline, was placed through the peel-away sheath and positioned in the superior vena cava.  There was good blood return from the port.  The port was then flushed with heparinized saline.  A single silk suture was used to attach the port to the pectoralis fascia.  A 100 units/mL of heparin solution was then instilled into the port.  Fluoroscopy showed good position of the catheter and no evidence of pneumothorax.  The incisions were closed with interrupted 3-0 Vicryl and a 4-0 subcuticular stitch. Dermabond was applied.  The patient tolerated the procedure without complications.  Sponge and needle count was reported as correct.  The patient was then transferred to the recovery room for postoperative observation.  PA and lateral chest x-ray will be obtained postoperatively.     Lanelle Bal, MD     EG/MEDQ  D:  10/30/2013  T:  10/30/2013  Job:  [id number]

## 2013-10-30 NOTE — Anesthesia Procedure Notes (Signed)
Date/Time: 10/30/2013 8:20 AM Performed by: Tamala Fothergill S Patient Re-evaluated:Patient Re-evaluated prior to inductionOxygen Delivery Method: Simple face mask Placement Confirmation: positive ETCO2

## 2013-10-30 NOTE — Telephone Encounter (Signed)
Brief Outpatient Oncology Nutrition Note  Patient has been identified to be at risk on malnutrition screen.  Wt Readings from Last 10 Encounters:  10/30/13 198 lb (89.812 kg)  10/30/13 198 lb (89.812 kg)  10/24/13 198 lb 10.2 oz (90.1 kg)  10/18/13 197 lb (89.359 kg)  10/15/13 197 lb 8 oz (89.585 kg)  10/11/13 197 lb 9.6 oz (89.631 kg)  10/10/13 199 lb (90.266 kg)  10/04/13 199 lb (90.266 kg)  09/18/13 197 lb 14.4 oz (89.767 kg)  09/12/13 195 lb 4.8 oz (88.587 kg)    Dx:  Non small cell lung cancer, squamous cell carcinoma initially diagnosed as stage IIIA Feb 2015.  Called patient due to weight loss.  UBW of 210 lbs in May.  Patient reported eating well and not needs at this time.  Antonieta Iba, RD, LDN

## 2013-10-31 ENCOUNTER — Encounter (HOSPITAL_COMMUNITY): Payer: Self-pay | Admitting: Cardiothoracic Surgery

## 2013-11-03 ENCOUNTER — Encounter: Payer: Self-pay | Admitting: Internal Medicine

## 2013-11-04 ENCOUNTER — Other Ambulatory Visit (HOSPITAL_BASED_OUTPATIENT_CLINIC_OR_DEPARTMENT_OTHER): Payer: Medicare Other

## 2013-11-04 ENCOUNTER — Ambulatory Visit (HOSPITAL_BASED_OUTPATIENT_CLINIC_OR_DEPARTMENT_OTHER): Payer: Medicare Other

## 2013-11-04 ENCOUNTER — Ambulatory Visit: Payer: Medicare Other

## 2013-11-04 ENCOUNTER — Other Ambulatory Visit: Payer: Self-pay | Admitting: *Deleted

## 2013-11-04 VITALS — BP 120/66 | HR 70 | Temp 98.1°F | Resp 18

## 2013-11-04 DIAGNOSIS — C782 Secondary malignant neoplasm of pleura: Secondary | ICD-10-CM

## 2013-11-04 DIAGNOSIS — Z5111 Encounter for antineoplastic chemotherapy: Secondary | ICD-10-CM

## 2013-11-04 DIAGNOSIS — Z95828 Presence of other vascular implants and grafts: Secondary | ICD-10-CM

## 2013-11-04 DIAGNOSIS — C3411 Malignant neoplasm of upper lobe, right bronchus or lung: Secondary | ICD-10-CM

## 2013-11-04 DIAGNOSIS — C3492 Malignant neoplasm of unspecified part of left bronchus or lung: Secondary | ICD-10-CM

## 2013-11-04 DIAGNOSIS — C3410 Malignant neoplasm of upper lobe, unspecified bronchus or lung: Secondary | ICD-10-CM

## 2013-11-04 LAB — CBC WITH DIFFERENTIAL/PLATELET
BASO%: 0 % (ref 0.0–2.0)
BASOS ABS: 0 10*3/uL (ref 0.0–0.1)
EOS%: 1.7 % (ref 0.0–7.0)
Eosinophils Absolute: 0 10*3/uL (ref 0.0–0.5)
HEMATOCRIT: 34.4 % — AB (ref 38.4–49.9)
HGB: 11.4 g/dL — ABNORMAL LOW (ref 13.0–17.1)
LYMPH%: 13.4 % — ABNORMAL LOW (ref 14.0–49.0)
MCH: 32.3 pg (ref 27.2–33.4)
MCHC: 33.1 g/dL (ref 32.0–36.0)
MCV: 97.5 fL (ref 79.3–98.0)
MONO#: 0.3 10*3/uL (ref 0.1–0.9)
MONO%: 12.1 % (ref 0.0–14.0)
NEUT%: 72.8 % (ref 39.0–75.0)
NEUTROS ABS: 1.7 10*3/uL (ref 1.5–6.5)
PLATELETS: 104 10*3/uL — AB (ref 140–400)
RBC: 3.53 10*6/uL — ABNORMAL LOW (ref 4.20–5.82)
RDW: 14 % (ref 11.0–14.6)
WBC: 2.3 10*3/uL — AB (ref 4.0–10.3)
lymph#: 0.3 10*3/uL — ABNORMAL LOW (ref 0.9–3.3)

## 2013-11-04 LAB — COMPREHENSIVE METABOLIC PANEL
ALT: 24 U/L (ref 0–53)
AST: 21 U/L (ref 0–37)
Albumin: 3.2 g/dL — ABNORMAL LOW (ref 3.5–5.2)
Alkaline Phosphatase: 125 U/L — ABNORMAL HIGH (ref 39–117)
BUN: 15 mg/dL (ref 6–23)
CHLORIDE: 106 meq/L (ref 96–112)
CO2: 24 meq/L (ref 19–32)
CREATININE: 0.72 mg/dL (ref 0.50–1.35)
Calcium: 9.4 mg/dL (ref 8.4–10.5)
Glucose, Bld: 124 mg/dL — ABNORMAL HIGH (ref 70–99)
Potassium: 4 mEq/L (ref 3.5–5.3)
Sodium: 141 mEq/L (ref 135–145)
Total Bilirubin: 0.2 mg/dL — ABNORMAL LOW (ref 0.2–1.2)
Total Protein: 6.7 g/dL (ref 6.0–8.3)

## 2013-11-04 MED ORDER — HEPARIN SOD (PORK) LOCK FLUSH 100 UNIT/ML IV SOLN
500.0000 [IU] | Freq: Once | INTRAVENOUS | Status: AC | PRN
  Administered 2013-11-04: 500 [IU]
  Filled 2013-11-04: qty 5

## 2013-11-04 MED ORDER — SODIUM CHLORIDE 0.9 % IJ SOLN
10.0000 mL | INTRAMUSCULAR | Status: DC | PRN
  Administered 2013-11-04: 10 mL via INTRAVENOUS
  Filled 2013-11-04: qty 10

## 2013-11-04 MED ORDER — DEXAMETHASONE SODIUM PHOSPHATE 10 MG/ML IJ SOLN
INTRAMUSCULAR | Status: AC
  Filled 2013-11-04: qty 1

## 2013-11-04 MED ORDER — SODIUM CHLORIDE 0.9 % IV SOLN
Freq: Once | INTRAVENOUS | Status: AC
  Administered 2013-11-04: 13:00:00 via INTRAVENOUS

## 2013-11-04 MED ORDER — ONDANSETRON 8 MG/NS 50 ML IVPB
INTRAVENOUS | Status: AC
  Filled 2013-11-04: qty 8

## 2013-11-04 MED ORDER — DEXAMETHASONE SODIUM PHOSPHATE 10 MG/ML IJ SOLN
10.0000 mg | Freq: Once | INTRAMUSCULAR | Status: AC
  Administered 2013-11-04: 10 mg via INTRAVENOUS

## 2013-11-04 MED ORDER — PACLITAXEL PROTEIN-BOUND CHEMO INJECTION 100 MG
100.0000 mg/m2 | Freq: Once | INTRAVENOUS | Status: AC
  Administered 2013-11-04: 225 mg via INTRAVENOUS
  Filled 2013-11-04: qty 45

## 2013-11-04 MED ORDER — SODIUM CHLORIDE 0.9 % IJ SOLN
10.0000 mL | INTRAMUSCULAR | Status: DC | PRN
  Administered 2013-11-04: 10 mL
  Filled 2013-11-04: qty 10

## 2013-11-04 MED ORDER — ONDANSETRON 8 MG/50ML IVPB (CHCC)
8.0000 mg | Freq: Once | INTRAVENOUS | Status: AC
  Administered 2013-11-04: 8 mg via INTRAVENOUS

## 2013-11-04 NOTE — Progress Notes (Signed)
Per Burnetta Sabin it is okay to treat pt today with Chemotherapy and today's CMP.

## 2013-11-04 NOTE — Patient Instructions (Signed)

## 2013-11-04 NOTE — Patient Instructions (Signed)
[facility] Discharge Instructions for Patients Receiving Chemotherapy  Today you received the following chemotherapy agents Abraxane.   To help prevent nausea and vomiting after your treatment, we encourage you to take your nausea medication as directed.   If you develop nausea and vomiting that is not controlled by your nausea medication, call the clinic.   BELOW ARE SYMPTOMS THAT SHOULD BE REPORTED IMMEDIATELY:  *FEVER GREATER THAN 100.5 F  *CHILLS WITH OR WITHOUT FEVER  NAUSEA AND VOMITING THAT IS NOT CONTROLLED WITH YOUR NAUSEA MEDICATION  *UNUSUAL SHORTNESS OF BREATH  *UNUSUAL BRUISING OR BLEEDING  TENDERNESS IN MOUTH AND THROAT WITH OR WITHOUT PRESENCE OF ULCERS  *URINARY PROBLEMS  *BOWEL PROBLEMS  UNUSUAL RASH Items with * indicate a potential emergency and should be followed up as soon as possible.  Feel free to call the clinic you have any questions or concerns. The clinic phone number is [phone].

## 2013-11-05 ENCOUNTER — Telehealth: Payer: Self-pay | Admitting: Internal Medicine

## 2013-11-05 NOTE — Telephone Encounter (Signed)
, °

## 2013-11-07 ENCOUNTER — Other Ambulatory Visit: Payer: Self-pay

## 2013-11-07 ENCOUNTER — Ambulatory Visit (HOSPITAL_BASED_OUTPATIENT_CLINIC_OR_DEPARTMENT_OTHER): Payer: Medicare Other | Admitting: Physician Assistant

## 2013-11-07 ENCOUNTER — Encounter: Payer: Self-pay | Admitting: Physician Assistant

## 2013-11-07 VITALS — BP 119/65 | HR 75 | Temp 97.8°F | Resp 18 | Ht 74.0 in | Wt 198.1 lb

## 2013-11-07 DIAGNOSIS — C7951 Secondary malignant neoplasm of bone: Secondary | ICD-10-CM

## 2013-11-07 DIAGNOSIS — C3410 Malignant neoplasm of upper lobe, unspecified bronchus or lung: Secondary | ICD-10-CM

## 2013-11-07 DIAGNOSIS — C7952 Secondary malignant neoplasm of bone marrow: Secondary | ICD-10-CM

## 2013-11-07 DIAGNOSIS — I771 Stricture of artery: Secondary | ICD-10-CM

## 2013-11-07 DIAGNOSIS — C782 Secondary malignant neoplasm of pleura: Secondary | ICD-10-CM

## 2013-11-07 MED ORDER — HYDROCODONE-HOMATROPINE 5-1.5 MG/5ML PO SYRP: 5.0000 mL | ORAL_SOLUTION | Freq: Four times a day (QID) | ORAL | Status: DC | PRN

## 2013-11-07 MED ORDER — OXYCODONE-ACETAMINOPHEN 5-325 MG PO TABS: 1.0000 | ORAL_TABLET | Freq: Four times a day (QID) | ORAL | Status: DC | PRN

## 2013-11-07 NOTE — Progress Notes (Addendum)
[place]  Telephone:([phone] Fax:(336) [phone] OFFICE VISIT PROGRESS NOTE  [name], MD 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   Primary site: Lung (Right)   Staging method: AJCC 7th Edition   Clinical: Stage IIIA (T3, N2, M0) signed by Curt Bears, MD on 05/09/2013  2:56 PM   Summary: Stage IIIA (T3, N2, M0)  PRIOR THERAPY:  1) Concurrent chemoradiation with weekly carboplatin for an AUC of 2 and paclitaxel 45 mg per meter squared. Status post 5 cycles. 2) Bronchoscopy, mediastinoscopy, right posterior lateral thoracotomy with right upper lobectomy, lymph node dissection,  and posterior approach to en bloc chest wall resection, ribs 2, 3, and 4, with positive bronchial resection margin. 3) palliative radiotherapy to the best of resection margin under the care of Dr. Tammi Klippel completed on 10/14/2013.  CURRENT THERAPY: Systemic chemotherapy with carboplatin for AUC of 5 on day 1 and Abraxane 100 mg/M2 on days 1, 8 and 15 every 3 weeks. First cycle 10/21/2013. 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    INTERVAL HISTORY: Terry Robles 73  y.o. male returns for followup visit accompanied by his wife. He is currently being treated with systemic chemotherapy in the form of carboplatin and Abraxane. Status post 1 cycle. He had a left anterior chest Port-A-Cath placement 10/30/2013 secondary to difficulty with peripheral venous access. He currently does not feel he needs EMLA cream as he did not find the Port-A-Cath access unbearable. He had a MRI of the brain on 10/28/2013 that was negative for any brain metastasis. He complains of his left arm being colder than his right arm. He reports that these symptoms have been present for approximately one month. He also complains of increased cough which is wet sounding although he is unable to cough up any secretions. He requests a refill for his oxycodone tablets. The patient completed on 10/14/2013 a course of radiotherapy to the positive of resection margin of the new surgical resection of his lung cancer.  He is feeling fine today with no specific complaints except for shortness of breath with exertion but no significant chest pain. He has no hemoptysis. He denied having any significant weight loss or night sweats. He has no nausea or vomiting.    MEDICAL HISTORY: Past Medical History  Diagnosis Date  . Hypercholesteremia 05/14/2011  . BPH (benign prostatic hyperplasia) 05/14/2011    takes Uroxatral daily  . GERD (gastroesophageal reflux disease)   . 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 Cancer     squamous cell carcinoma right superior sulcus  . Lung cancer     right lung  . Shortness of breath  weather related  . Pneumonia 1965  . Headache(784.0)     occasionally  . Joint pain   . Maintenance chemotherapy   . Urinary frequency   . History of shingles     ALLERGIES:  has No Known Allergies.  MEDICATIONS:  Current Outpatient Prescriptions  Medication Sig Dispense Refill  . alfuzosin (UROXATRAL) 10 MG 24 hr tablet Take 10 mg by mouth daily with breakfast.        . cycloSPORINE (RESTASIS) 0.05 % ophthalmic emulsion 1 drop 2 (two) times daily.      . Multiple Vitamin (MULITIVITAMIN WITH MINERALS) TABS Take 1 tablet by mouth daily.      . polyethylene glycol (MIRALAX / GLYCOLAX) packet Take 17 g by mouth daily.      Marland Kitchen HYDROcodone-homatropine (HYCODAN) 5-1.5 MG/5ML syrup Take 5 mLs by mouth every 6 (six) hours as needed for cough.  240 mL  0  . LORazepam (ATIVAN) 1 MG tablet Take 1 tablet by mouth one hour before MRI.  1 tablet  0  . oxyCODONE-acetaminophen (PERCOCET/ROXICET) 5-325 MG per tablet Take 1 tablet by mouth every 6 (six) hours as needed for severe pain.  60 tablet  0   No current facility-administered medications for this visit.    SURGICAL HISTORY:  Past Surgical History  Procedure Laterality Date  . Other surgical history      benign tumor removed from left leg several years ago  . Other surgical history      had melanoma/squamous cell removed previously per patient  . Tumor excision      Left knee  . Colonoscopy w/ biopsies and polypectomy    . Video bronchoscopy N/A 07/30/2013    Procedure: VIDEO BRONCHOSCOPY;  Surgeon: Grace Isaac, MD;  Location: [facility];  Service: Thoracic;  Laterality: N/A;  . Mediastinoscopy N/A 07/30/2013    Procedure: MEDIASTINOSCOPY;  Surgeon: Grace Isaac, MD;  Location: [facility];  Service: Thoracic;  Laterality: N/A;  . Video assisted thoracoscopy (vats)/wedge resection Right 07/30/2013    Procedure: VIDEO ASSISTED THORACOSCOPY;  Surgeon: Grace Isaac, MD;  Location: [facility];  Service: Thoracic;  Laterality: Right;  . Thoracotomy  07/30/2013    Procedure: THORACOTOMY FOR CHEST WALL RESECTION, RIGHT UPPER LOBECTOMY;  Surgeon: Grace Isaac, MD;  Location: [facility];  Service: Thoracic;;  . Adenoidectomy  as a child  . Tonsillectomy    . Portacath placement Left 10/30/2013    Procedure: INSERTION PORT-A-CATH WITH ULTRASOUND GUIDANCE AND FLUORO;  Surgeon: Grace Isaac, MD;  Location: [facility];  Service:  Thoracic;  Laterality: Left;    REVIEW OF SYSTEMS:  Constitutional: positive for fatigue Eyes: negative Ears, nose, mouth, throat, and face: negative Respiratory: positive for dyspnea on exertion and pleurisy/chest pain Cardiovascular: negative Gastrointestinal: positive for nausea Genitourinary:negative Integument/breast: negative Hematologic/lymphatic: negative Musculoskeletal:negative Neurological: negative Behavioral/Psych: negative Endocrine: negative Allergic/Immunologic: negative   PHYSICAL EXAMINATION: General appearance: alert, cooperative, appears stated age and no distress Head: Normocephalic, without obvious abnormality, atraumatic Neck: no adenopathy, no carotid bruit, no JVD, supple, symmetrical, trachea midline and thyroid not enlarged, symmetric, no tenderness/mass/nodules Lymph nodes: Cervical, supraclavicular, and axillary nodes normal. Resp: clear to auscultation bilaterally Back: symmetric, no curvature. ROM normal. No CVA tenderness. Cardio: regular rate and rhythm, S1, S2 normal, no murmur, click, rub or gallop GI: soft, non-tender; bowel sounds normal; no masses,  no organomegaly Extremities: extremities normal, atraumatic, no cyanosis or edema and The left forearm and hand are cooler to touch than the  right Neurologic: Alert and oriented X 3, normal strength and tone. Normal symmetric reflexes. Normal coordination and gait Skin: Left anterior chest Port-A-Cath incisions are healing well without evidence of wound dehiscence or  infection  ECOG PERFORMANCE STATUS: 1 - Symptomatic but completely ambulatory  Blood pressure 119/65, pulse 75, temperature 97.8 F (36.6 C), temperature source Oral, resp. rate 18, height 6\' 2"  (1.88 m), weight 198 lb 1.6 oz (89.858 kg).  LABORATORY DATA: Lab Results  Component Value Date   WBC 2.3* 11/04/2013   HGB 11.4* 11/04/2013   HCT 34.4* 11/04/2013   MCV 97.5 11/04/2013   PLT 104* 11/04/2013      Chemistry        Component Value Date/Time   NA 141 11/04/2013 1047   NA 141 10/28/2013 1034   K 4.0 11/04/2013 1047   K 3.9 10/28/2013 1034   CL 106 11/04/2013 1047   CO2 24 11/04/2013 1047   CO2 26 10/28/2013 1034   BUN 15 11/04/2013 1047   BUN 14.8 10/28/2013 1034   CREATININE 0.72 11/04/2013 1047   CREATININE 0.8 10/28/2013 1034      Component Value Date/Time   CALCIUM 9.4 11/04/2013 1047   CALCIUM 9.3 10/28/2013 1034   ALKPHOS 125* 11/04/2013 1047   ALKPHOS 130 10/28/2013 1034   AST 21 11/04/2013 1047   AST 41* 10/28/2013 1034   ALT 24 11/04/2013 1047   ALT 50 10/28/2013 1034   BILITOT 0.2* 11/04/2013 1047   BILITOT 0.49 10/28/2013 1034       RADIOGRAPHIC STUDIES: Dg Chest 2 View  10/10/2013   CLINICAL DATA:  History right lung cancer, status post surgery 07/2013  EXAM: CHEST  2 VIEW  COMPARISON:  Chest radiograph dated 08/26/2013 CT chest dated 07/24/2013  FINDINGS: Postsurgical changes in the right upper hemithorax with volume loss and increased right apical capping/fluid.  Pleural-based lesion along the left lateral hemithorax, new/increased, with destruction of the left lateral 7th rib.  No pneumothorax.  The heart is normal in size.  Degenerative changes of the visualized thoracolumbar spine.  IMPRESSION: Postsurgical changes in the right upper hemithorax with increased right apical capping/fluid.  Pleural-based lesion along the left lateral hemithorax, new/increased, with destruction of the left lateral 7th rib.  Consider CT chest with contrast for further evaluation.  These results will be called to the ordering clinician or representative by the Radiologist Assistant, and communication documented in the PACS or zVision Dashboard.   Electronically Signed   By: Julian Hy M.D.   On: 10/10/2013 09:42   Ct Chest W Contrast  10/11/2013   CLINICAL DATA:  Followup metastatic lung carcinoma. Recent right thoracotomy. On going radiation therapy. Recently completed chemotherapy.  EXAM: CT CHEST WITH CONTRAST   TECHNIQUE: Multidetector CT imaging of the chest was performed during intravenous contrast administration.  CONTRAST:  68mL OMNIPAQUE IOHEXOL 300 MG/ML  SOLN  COMPARISON:  07/24/2013  FINDINGS: Patient has undergone right thoracotomy and right upper lobectomy since prior exam. Parenchymal consolidation and central air bronchograms is seen in the right posterior upper lung field, with surgical clips in the adjacent right hilum. 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 A small right pleural effusion is noted, however there is no evidence of associated pleural nodularity or chest wall mass. No suspicious pulmonary nodules or masses are seen involving either lung. Mild emphysema noted. No evidence of left-sided pleural effusion.  No hilar lymphadenopathy identified. Mild mediastinal adenopathy in the subcarinal region shows increased since previous study, with  lymph node measuring 16 mm on image 27 compared with 7 mm previously. No other pathologically enlarged nodes are seen within the thorax.  A left posterior chest wall mass is seen with destruction of left posterior lateral seventh rib. This measures 3.6 x 2.7 cm on image 30. This is new since previous study and consistent with a lytic bone metastasis. No other suspicious bone lesions seen within the thorax.  Both adrenal glands are normal in appearance. Tiny calcified gallstones again seen, however there is no evidence of cholecystitis. A subcutaneous lesion is seen in the left anterior chest wall measuring 1.7 x 5.3 cm which is stable and consistent with a sebaceous cyst.  IMPRESSION: Postop changes from interval right upper lobectomy. 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 Small right pleural effusion also noted.  Increased mild mediastinal lymphadenopathy in the subcarinal region, suspicious for metastatic disease.  New lytic bone metastasis with associated chest wall  mass involving the left posterior seventh rib.   Electronically Signed   By: Earle Gell M.D.   On: 10/11/2013 14:39   ASSESSMENT/PLAN:  This is a very pleasant 73 years old white male recently diagnosed with a stage IIIA non-small cell lung cancer. He completed a course of concurrent chemoradiation with weekly carboplatin and paclitaxel status post 5 cycle, this was followed by right upper lobectomy with lymph node dissection under the care of Dr. Servando Snare. The final pathology showed positive bronchial resection margin with a pathologic stage ypT3, ypN0. The patient underwent a course of radiotherapy to the positive resection margin but unfortunately the recent CT scan of the chest showed evidence for disease progression with new lytic rib lesion as well as enlarged mediastinal lymphadenopathy. The MRI of the brain as stated above is negative for brain metastasis. He is status post one cycle of systemic chemotherapy with carboplatin for AUC of 5 on day 1 and Abraxane 100 mg/M2 on days 1, 8 and 15 every 3 weeks. Patient was discussed with and also seen by Dr. Julien Nordmann. He will proceed with cycle #2 of his systemic chemotherapy with carboplatin and Abraxane as scheduled. He will return for another symptom management visit prior to the start of cycle #3. To further assess his cool extremity we'll refer him to vascular surgery.  He was advised to call immediately if he has any concerning symptoms in the interval.  Carlton Adam, PA-C  ADDENDUM: Hematology/Oncology Attending:  I had a face to face encounter with the patient. I recommended his care plan. This is a very pleasant 73 years old white male with metastatic non-small cell lung cancer, squamous cell carcinoma and currently undergoing systemic chemotherapy with carboplatin for AUC of 5 on day 1 and Abraxane 100 mg/M2 on days 1, 8 and 15 every 3 weeks status post 1 cycle. He tolerated the first cycle of his treatment fairly well but he continues to  have the pain on the left side of the chest from the pleural-based metastasis. He also feels that his left arm is colder than the right arm and this is confirmed on the physical exam. The etiology is unclear and I would consider referring the patient to vascular surgery for evaluation. I recommended for the patient to continue his current treatment with carboplatin and Abraxane. For pain management the patient will continue his current treatment with Percocet. He would come back for followup visit in 3 weeks with the start of cycle #3. He was advised  to call immediately if he has any concerning symptoms in the interval.  Disclaimer: This note was dictated with voice recognition software. Similar sounding words can inadvertently be transcribed and may not be corrected upon review. [name]., MD 11/10/2013

## 2013-11-07 NOTE — Patient Instructions (Signed)
We are referring you to a vascular surgeon regarding your complaints of a cool extremity Continue labs and chemotherapy as scheduled Follow up in approximately 3 weeks

## 2013-11-11 ENCOUNTER — Other Ambulatory Visit (HOSPITAL_BASED_OUTPATIENT_CLINIC_OR_DEPARTMENT_OTHER): Payer: Medicare Other

## 2013-11-11 ENCOUNTER — Other Ambulatory Visit: Payer: Self-pay | Admitting: Internal Medicine

## 2013-11-11 ENCOUNTER — Telehealth: Payer: Self-pay | Admitting: Internal Medicine

## 2013-11-11 ENCOUNTER — Other Ambulatory Visit: Payer: Medicare Other

## 2013-11-11 ENCOUNTER — Ambulatory Visit: Payer: Medicare Other

## 2013-11-11 ENCOUNTER — Ambulatory Visit (HOSPITAL_BASED_OUTPATIENT_CLINIC_OR_DEPARTMENT_OTHER): Payer: Medicare Other

## 2013-11-11 VITALS — BP 123/62 | HR 79 | Temp 97.8°F

## 2013-11-11 DIAGNOSIS — C3410 Malignant neoplasm of upper lobe, unspecified bronchus or lung: Secondary | ICD-10-CM

## 2013-11-11 DIAGNOSIS — Z5111 Encounter for antineoplastic chemotherapy: Secondary | ICD-10-CM

## 2013-11-11 DIAGNOSIS — Z95828 Presence of other vascular implants and grafts: Secondary | ICD-10-CM

## 2013-11-11 DIAGNOSIS — C3411 Malignant neoplasm of upper lobe, right bronchus or lung: Secondary | ICD-10-CM

## 2013-11-11 LAB — COMPREHENSIVE METABOLIC PANEL (CC13)
ALK PHOS: 104 U/L (ref 40–150)
ALT: 16 U/L (ref 0–55)
ANION GAP: 6 meq/L (ref 3–11)
AST: 18 U/L (ref 5–34)
Albumin: 3.2 g/dL — ABNORMAL LOW (ref 3.5–5.0)
BILIRUBIN TOTAL: 0.33 mg/dL (ref 0.20–1.20)
BUN: 13.4 mg/dL (ref 7.0–26.0)
CO2: 25 mEq/L (ref 22–29)
Calcium: 9.4 mg/dL (ref 8.4–10.4)
Chloride: 107 mEq/L (ref 98–109)
Creatinine: 0.8 mg/dL (ref 0.7–1.3)
GLUCOSE: 111 mg/dL (ref 70–140)
Potassium: 4.2 mEq/L (ref 3.5–5.1)
Sodium: 138 mEq/L (ref 136–145)
Total Protein: 6.5 g/dL (ref 6.4–8.3)

## 2013-11-11 LAB — CBC WITH DIFFERENTIAL/PLATELET
BASO%: 0.6 % (ref 0.0–2.0)
BASOS ABS: 0 10*3/uL (ref 0.0–0.1)
EOS ABS: 0 10*3/uL (ref 0.0–0.5)
EOS%: 1.1 % (ref 0.0–7.0)
HCT: 32.2 % — ABNORMAL LOW (ref 38.4–49.9)
HEMOGLOBIN: 10.7 g/dL — AB (ref 13.0–17.1)
LYMPH%: 14.6 % (ref 14.0–49.0)
MCH: 32.1 pg (ref 27.2–33.4)
MCHC: 33.3 g/dL (ref 32.0–36.0)
MCV: 96.3 fL (ref 79.3–98.0)
MONO#: 0.3 10*3/uL (ref 0.1–0.9)
MONO%: 11.6 % (ref 0.0–14.0)
NEUT%: 72.1 % (ref 39.0–75.0)
NEUTROS ABS: 1.7 10*3/uL (ref 1.5–6.5)
PLATELETS: 104 10*3/uL — AB (ref 140–400)
RBC: 3.34 10*6/uL — ABNORMAL LOW (ref 4.20–5.82)
RDW: 14 % (ref 11.0–14.6)
WBC: 2.4 10*3/uL — ABNORMAL LOW (ref 4.0–10.3)
lymph#: 0.3 10*3/uL — ABNORMAL LOW (ref 0.9–3.3)

## 2013-11-11 MED ORDER — ONDANSETRON 16 MG/50ML IVPB (CHCC)
INTRAVENOUS | Status: AC
  Filled 2013-11-11: qty 16

## 2013-11-11 MED ORDER — PACLITAXEL PROTEIN-BOUND CHEMO INJECTION 100 MG
100.0000 mg/m2 | Freq: Once | INTRAVENOUS | Status: AC
  Administered 2013-11-11: 225 mg via INTRAVENOUS
  Filled 2013-11-11: qty 45

## 2013-11-11 MED ORDER — HEPARIN SOD (PORK) LOCK FLUSH 100 UNIT/ML IV SOLN
500.0000 [IU] | Freq: Once | INTRAVENOUS | Status: AC | PRN
  Administered 2013-11-11: 500 [IU]
  Filled 2013-11-11: qty 5

## 2013-11-11 MED ORDER — SODIUM CHLORIDE 0.9 % IJ SOLN
10.0000 mL | INTRAMUSCULAR | Status: DC | PRN
  Administered 2013-11-11: 10 mL via INTRAVENOUS
  Filled 2013-11-11: qty 10

## 2013-11-11 MED ORDER — SODIUM CHLORIDE 0.9 % IV SOLN
548.0000 mg | Freq: Once | INTRAVENOUS | Status: AC
  Administered 2013-11-11: 550 mg via INTRAVENOUS
  Filled 2013-11-11: qty 55

## 2013-11-11 MED ORDER — DEXAMETHASONE SODIUM PHOSPHATE 20 MG/5ML IJ SOLN
20.0000 mg | Freq: Once | INTRAMUSCULAR | Status: AC
  Administered 2013-11-11: 20 mg via INTRAVENOUS

## 2013-11-11 MED ORDER — DEXAMETHASONE SODIUM PHOSPHATE 20 MG/5ML IJ SOLN
INTRAMUSCULAR | Status: AC
  Filled 2013-11-11: qty 5

## 2013-11-11 MED ORDER — ONDANSETRON 16 MG/50ML IVPB (CHCC)
16.0000 mg | Freq: Once | INTRAVENOUS | Status: AC
  Administered 2013-11-11: 16 mg via INTRAVENOUS

## 2013-11-11 MED ORDER — SODIUM CHLORIDE 0.9 % IV SOLN
Freq: Once | INTRAVENOUS | Status: AC
  Administered 2013-11-11: 12:00:00 via INTRAVENOUS

## 2013-11-11 MED ORDER — SODIUM CHLORIDE 0.9 % IJ SOLN
10.0000 mL | INTRAMUSCULAR | Status: DC | PRN
  Administered 2013-11-11: 10 mL
  Filled 2013-11-11: qty 10

## 2013-11-11 NOTE — Telephone Encounter (Signed)
Pt had labs done earlier, lab advised pt to have flush added to scheduled for each lab, gave pt updated schedule...KJ

## 2013-11-11 NOTE — Patient Instructions (Signed)

## 2013-11-11 NOTE — Patient Instructions (Signed)
[facility] Discharge Instructions for Patients Receiving Chemotherapy  Today you received the following chemotherapy agents:Abraxane and carboplatin.  To help prevent nausea and vomiting after your treatment, we encourage you to take your nausea medication: compazine 10mg  every 6 hours as needed.   If you develop nausea and vomiting that is not controlled by your nausea medication, call the clinic.   BELOW ARE SYMPTOMS THAT SHOULD BE REPORTED IMMEDIATELY:  *FEVER GREATER THAN 100.5 F  *CHILLS WITH OR WITHOUT FEVER  NAUSEA AND VOMITING THAT IS NOT CONTROLLED WITH YOUR NAUSEA MEDICATION  *UNUSUAL SHORTNESS OF BREATH  *UNUSUAL BRUISING OR BLEEDING  TENDERNESS IN MOUTH AND THROAT WITH OR WITHOUT PRESENCE OF ULCERS  *URINARY PROBLEMS  *BOWEL PROBLEMS  UNUSUAL RASH Items with * indicate a potential emergency and should be followed up as soon as possible.  Feel free to call the clinic you have any questions or concerns. The clinic phone number is [phone].

## 2013-11-12 ENCOUNTER — Ambulatory Visit: Payer: Medicare Other | Admitting: Internal Medicine

## 2013-11-12 ENCOUNTER — Telehealth: Payer: Self-pay | Admitting: *Deleted

## 2013-11-12 ENCOUNTER — Telehealth: Payer: Self-pay | Admitting: Physician Assistant

## 2013-11-12 ENCOUNTER — Other Ambulatory Visit: Payer: Medicare Other

## 2013-11-12 NOTE — Telephone Encounter (Signed)
Lft msg for pt labs/ov per 08/13 POF, mailed pt AVS....KJ

## 2013-11-12 NOTE — Telephone Encounter (Signed)
Per staff message and POF I have scheduled appts. Advised scheduler of appts. JMW  

## 2013-11-13 ENCOUNTER — Encounter: Payer: Self-pay | Admitting: Radiation Oncology

## 2013-11-13 ENCOUNTER — Inpatient Hospital Stay
Admission: RE | Admit: 2013-11-13 | Discharge: 2013-11-13 | Disposition: A | Discharge status: Home / Self Care | Payer: Medicare Other | Source: Ambulatory Visit | Attending: Radiation Oncology | Admitting: Radiation Oncology

## 2013-11-13 ENCOUNTER — Other Ambulatory Visit: Payer: Self-pay | Admitting: Medical Oncology

## 2013-11-13 ENCOUNTER — Ambulatory Visit (HOSPITAL_BASED_OUTPATIENT_CLINIC_OR_DEPARTMENT_OTHER): Payer: Medicare Other

## 2013-11-13 VITALS — BP 121/61 | HR 68 | Temp 98.0°F | Resp 18

## 2013-11-13 VITALS — Resp 18 | Wt 194.5 lb

## 2013-11-13 DIAGNOSIS — C3410 Malignant neoplasm of upper lobe, unspecified bronchus or lung: Secondary | ICD-10-CM

## 2013-11-13 DIAGNOSIS — C3411 Malignant neoplasm of upper lobe, right bronchus or lung: Secondary | ICD-10-CM

## 2013-11-13 DIAGNOSIS — E860 Dehydration: Secondary | ICD-10-CM

## 2013-11-13 MED ORDER — SODIUM CHLORIDE 0.9 % IJ SOLN
10.0000 mL | INTRAMUSCULAR | Status: DC | PRN
  Administered 2013-11-13: 10 mL via INTRAVENOUS
  Filled 2013-11-13: qty 10

## 2013-11-13 MED ORDER — SODIUM CHLORIDE 0.9 % IV SOLN
Freq: Once | INTRAVENOUS | Status: AC
  Administered 2013-11-13: 17:00:00 via INTRAVENOUS

## 2013-11-13 MED ORDER — HEPARIN SOD (PORK) LOCK FLUSH 100 UNIT/ML IV SOLN
500.0000 [IU] | Freq: Once | INTRAVENOUS | Status: AC
  Administered 2013-11-13: 500 [IU] via INTRAVENOUS
  Filled 2013-11-13: qty 5

## 2013-11-13 NOTE — Progress Notes (Signed)
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  preoperative chemoradiotherapy 05/20/2013-06/21/2013 to 45 Gy  2. Post-operative radiotherapy 09/25/2013-10/14/2013 the right bronchial stump area/hilum was treated to 26 Gy in 13 fractions of 2 Gy  Interval Since Last Radiation:  4  weeks  Narrative:  The patient returns today for routine follow-up.  Completed fourth round of chemotherapy on Monday. Feeling weak today. Wife holding onto patient as he ambulates because she reports he is "wobbly." Reports a persistent "terrible" dry cough. Reports shortness of breath with exertion. Reports decreased appetite. Reports he doesn't feel well today as a result of chemo on Monday thus, he has eaten and drank even less today. Reports several loose stools per day. Reports mild pain in right shoulder and left rib. Four pound weight loss noted in six days. Taking hycodan at night for cough to aid in sleep. Denies headache, dizziness, nausea or vomiting  Minimal rib pain at present.                              ALLERGIES:  has No Known Allergies.  Meds: Current Outpatient Prescriptions  Medication Sig Dispense Refill  . alfuzosin (UROXATRAL) 10 MG 24 hr tablet Take 10 mg by mouth daily with breakfast.      . cycloSPORINE (RESTASIS) 0.05 % ophthalmic emulsion 1 drop 2 (two) times daily.      Marland Kitchen HYDROcodone-homatropine (HYCODAN) 5-1.5 MG/5ML syrup Take 5 mLs by mouth every 6 (six) hours as needed for cough.  240 mL  0  . Multiple Vitamin (MULITIVITAMIN WITH MINERALS) TABS Take 1 tablet by mouth daily.      Marland Kitchen oxyCODONE-acetaminophen (PERCOCET/ROXICET) 5-325 MG per tablet Take 1 tablet by mouth every 6 (six) hours as needed for severe pain.  60  tablet  0  . polyethylene glycol (MIRALAX / GLYCOLAX) packet Take 17 g by mouth daily.      . prochlorperazine (COMPAZINE) 5 MG tablet Take 5 mg by mouth every 6 (six) hours as needed for nausea or vomiting.       No current facility-administered medications for this encounter.   Facility-Administered Medications Ordered in Other Encounters  Medication Dose Route Frequency Provider Last Rate Last Dose  . sodium chloride 0.9 % injection 10 mL  10 mL Intravenous PRN Curt Bears, MD   10 mL at 11/13/13 1744    Physical Findings: The patient is in no acute distress. Patient is alert and oriented.  weight is 194 lb 8 oz (88.225 kg). His respiration is 18. . Sitting heart rate 80, bp 95/55  Standing heart rate 90, bp 64/41  Lab Findings: Lab Results  Component Value Date   WBC 2.4* 11/11/2013   HGB 10.7* 11/11/2013   HCT 32.2* 11/11/2013   MCV 96.3 11/11/2013   PLT 104* 11/11/2013    @LASTCHEM @  Radiographic Findings: Dg Chest 2 View  10/30/2013   CLINICAL DATA:  Port-A-Cath placement.  EXAM: CHEST  2 VIEW  COMPARISON:  10/30/2013  FINDINGS: Left Port-A-Cath has been placed with the tip at the cavoatrial junction. No pneumothorax.  Postsurgical changes noted on the right, stable. Heart is normal  size. Right apical pleural thickening is stable. Rounded masslike opacity laterally in the left hemithorax is stable. Small right pleural effusion.  IMPRESSION: Right Port-A-Cath placement with the tip at the cavoatrial junction. No pneumothorax. Otherwise no change.   Electronically Signed   By: Rolm Baptise M.D.   On: 10/30/2013 11:09   Dg Chest 2 View Within Previous 72 Hours.  Films Obtained On Friday Are Acceptable For Monday And Tuesday Cases  10/30/2013   CLINICAL DATA:  Lung carcinoma  EXAM: CHEST  2 VIEW  COMPARISON:  Chest radiograph October 10, 2013 and PET-CT in October 18, 2013  FINDINGS: The mass in the left mid lung region is unchanged, measuring approximately 8.6 x 4.2 cm. There is  destruction of a portion of the posterior and lateral left seventh rib, stable. There is opacification in the right apex, stable. There is evidence of old rib trauma on the right. No new opacity is seen. The heart size is normal. The pulmonary vascularity is within normal limits given the cicatrization on the right. No adenopathy is appreciable. There is degenerative change in the thoracic spine.  IMPRESSION: Right apex and left mid lung lesions. Note that there is destruction of a portion of the left seventh rib. No new opacity. Scarring on the right is stable. No change is appreciable in the cardiomediastinal silhouette.   Electronically Signed   By: Lowella Grip M.D.   On: 10/30/2013 07:16   Mr Jeri Cos WF Contrast  10/28/2013   CLINICAL DATA:  Lung cancer staging.  EXAM: MRI HEAD WITHOUT AND WITH CONTRAST  TECHNIQUE: Multiplanar, multiecho pulse sequences of the brain and surrounding structures were obtained without and with intravenous contrast.  CONTRAST:  20mL MULTIHANCE GADOBENATE DIMEGLUMINE 529 MG/ML IV SOLN  COMPARISON:  05/14/2013 MR [facility]. PET scan 10/18/2013.  FINDINGS: No evidence for acute infarction, hemorrhage, mass lesion, hydrocephalus, or extra-axial fluid. Mild atrophy. Mild subcortical and periventricular T2 and FLAIR hyperintensities, likely chronic microvascular ischemic change. Flow voids are maintained throughout the carotid, basilar, and vertebral arteries. There are no areas of chronic hemorrhage. Partial empty sella. No tonsillar herniation. Post infusion, no abnormal enhancement of the brain or meninges. Incidental RIGHT posterior temporal venous angioma. No acute findings in the orbits, sinuses, or mastoids. No osseous lesion.  Attention was directed to the LEFT skull base, soft tissue neck region posteriorly as questioned from prior PET (image 30 series 605). This area is incompletely evaluated as part of an MR brain, but there are no obvious masses in the  suboccipital region as observed on today's study. If further delineation is desired, an MRI of the cervical spine with attention to this area, pre and postcontrast, using fat saturation techniques, it is recommended for further evaluation.  IMPRESSION: No acute intracranial abnormality.  No evidence for abnormal enhancement to suggest intracranial metastatic disease.   Electronically Signed   By: Rolla Flatten M.D.   On: 10/28/2013 17:01   Nm Pet Image Restag (ps) Skull Base To Thigh  10/18/2013   CLINICAL DATA:  Subsequent treatment strategy for non-small cell lung cancer. Patient status post scan radiation therapy in surgery to the right hemi thorax. Bb.  EXAM: NUCLEAR MEDICINE PET SKULL BASE TO THIGH  TECHNIQUE: 10.4 mCi F-18 FDG was injected intravenously. Full-ring PET imaging was performed from the skull base to thigh after the radiotracer. CT data was obtained and used for attenuation correction and anatomic localization.  FASTING BLOOD GLUCOSE:  Value: 106 mg/dl  COMPARISON:  CT 10/11/2013, PET-CT 04/25/2013  FINDINGS: NECK  There are 2 hypermetabolic foci within the musculature of the high left neck just inferior to the occiput and adjacent to the C3 vertebral body seen on images 30 and 38 respectively. No clear lesions identified on the CT portion the exam. There is some motion artifact.  CHEST  There is intense metabolic activity associated with the consolidation in the right upper lobe with central air bronchograms. Burtis Junes this to represent postradiation lung inflammation. Postoperative change along the right lower right lateral chest wall with reconstructive surgery.  There is an intensely hypermetabolic subcarinal lymph node measuring 14 mm (image 88) with SUV max 8.4.  Within the left hemi thorax there is an expansile lesion associated with the left posterior seventh rib which measures 3.1 cm and has intense metabolic activity with SUV max 14.5. There is a small soft tissue nodule more inferiorly  along the left lateral chest wall between the ribs and the costal musculature measuring 13 mm (image 120, series 4) with SUV max 13.5.  There is a small right effusion  ABDOMEN/PELVIS  No abnormal hypermetabolic activity within the liver, pancreas, adrenal glands, or spleen. No hypermetabolic lymph nodes in the abdomen or pelvis.  SKELETON  No dditional lesions other than the left rib lesion.  IMPRESSION: 1. Metastatic lung cancer with a expansile intensely hypermetabolic mass lesion lesion associated with the left posterior seventh rib. 2. Small nodular tissue metastasis to the left chest wall between the ribs and musculature. 3. Subcarinal nodal metastasis. 4. Potential soft tissue metastasis within the paraspinal musculature of the high left neck.   Electronically Signed   By: Suzy Bouchard M.D.   On: 10/18/2013 13:46   Dg Chest Port 1 View  10/30/2013   CLINICAL DATA:  Status post port placement  EXAM: PORTABLE CHEST - 1 VIEW  COMPARISON:  10/30/2013  FINDINGS: A new left chest wall port is seen with the catheter tip in the superior aspect of the right atrium. No pneumothorax is noted. Destructive changes of the left seventh rib are noted posteriorly. Chronic changes in the right apex are seen.  IMPRESSION: No pneumothorax following port placement.  Stable changes from the prior exam.   Electronically Signed   By: Inez Catalina M.D.   On: 10/30/2013 10:18   Dg Fluoro Guide Cv Line-no Report  10/30/2013   CLINICAL DATA: insertion port a cath OR# [id number]   FLOURO GUIDE CV LINE  Fluoroscopy was utilized by the requesting physician.  No radiographic  interpretation.     Impression:  The patient is recovering from the effects of radiation, but appears hypovolemic after chemo.    Plan:  IV fluid bolus now, follow-up prn.  Localized XRT could be used later for left rib lesions if other disease stable and/or rib pain.  _____________________________________  Sheral Apley. Tammi Klippel, M.D.

## 2013-11-13 NOTE — Patient Instructions (Signed)
Dehydration, Adult Dehydration is when you lose more fluids from the body than you take in. Vital organs like the kidneys, brain, and heart cannot function without a proper amount of fluids and salt. Any loss of fluids from the body can cause dehydration.  CAUSES   Vomiting.  Diarrhea.  Excessive sweating.  Excessive urine output.  Fever. SYMPTOMS  Mild dehydration  Thirst.  Dry lips.  Slightly dry mouth. Moderate dehydration  Very dry mouth.  Sunken eyes.  Skin does not bounce back quickly when lightly pinched and released.  Dark urine and decreased urine production.  Decreased tear production.  Headache. Severe dehydration  Very dry mouth.  Extreme thirst.  Rapid, weak pulse (more than 100 beats per minute at rest).  Cold hands and feet.  Not able to sweat in spite of heat and temperature.  Rapid breathing.  Blue lips.  Confusion and lethargy.  Difficulty being awakened.  Minimal urine production.  No tears. DIAGNOSIS  Your caregiver will diagnose dehydration based on your symptoms and your exam. Blood and urine tests will help confirm the diagnosis. The diagnostic evaluation should also identify the cause of dehydration. TREATMENT  Treatment of mild or moderate dehydration can often be done at home by increasing the amount of fluids that you drink. It is best to drink small amounts of fluid more often. Drinking too much at one time can make vomiting worse. Refer to the home care instructions below. Severe dehydration needs to be treated at the hospital where you will probably be given intravenous (IV) fluids that contain water and electrolytes. HOME CARE INSTRUCTIONS   Ask your caregiver about specific rehydration instructions.  Drink enough fluids to keep your urine clear or pale yellow.  Drink small amounts frequently if you have nausea and vomiting.  Eat as you normally do.  Avoid:  Foods or drinks high in sugar.  Carbonated  drinks.  Juice.  Extremely hot or cold fluids.  Drinks with caffeine.  Fatty, greasy foods.  Alcohol.  Tobacco.  Overeating.  Gelatin desserts.  Wash your hands well to avoid spreading bacteria and viruses.  Only take over-the-counter or prescription medicines for pain, discomfort, or fever as directed by your caregiver.  Ask your caregiver if you should continue all prescribed and over-the-counter medicines.  Keep all follow-up appointments with your caregiver. SEEK MEDICAL CARE IF:  You have abdominal pain and it increases or stays in one area (localizes).  You have a rash, stiff neck, or severe headache.  You are irritable, sleepy, or difficult to awaken.  You are weak, dizzy, or extremely thirsty. SEEK IMMEDIATE MEDICAL CARE IF:   You are unable to keep fluids down or you get worse despite treatment.  You have frequent episodes of vomiting or diarrhea.  You have blood or green matter (bile) in your vomit.  You have blood in your stool or your stool looks black and tarry.  You have not urinated in 6 to 8 hours, or you have only urinated a small amount of very dark urine.  You have a fever.  You faint. MAKE SURE YOU:   Understand these instructions.  Will watch your condition.  Will get help right away if you are not doing well or get worse. Document Released: 03/14/2005 Document Revised: 06/06/2011 Document Reviewed: 11/01/2010 Exit[employer] Patient Information 2015 [employer]. This information is not intended to replace advice given to you by your health care provider. Make sure you discuss any questions you have with your health care   provider.  

## 2013-11-13 NOTE — Progress Notes (Signed)
Completed fourth round of chemotherapy on Monday. Feeling weak today. Wife holding onto patient as he ambulates because she reports he is "wobbly." Reports a persistent "terrible" dry cough. Reports shortness of breath with exertion. Reports decreased appetite. Reports he doesn't feel well today as a result of chemo on Monday thus, he has eaten and drank even less today. Reports several loose stools per day. Reports mild pain in right shoulder and left rib. Four pound weight loss noted in six days. Taking hycodan at night for cough to aid in sleep. Denies headache, dizziness, nausea or vomiting.  Sitting heart rate 80, bp 95/55 Standing heart rate 90, bp 64/41 Dehydration noted and reported to Dr. Tammi Klippel

## 2013-11-14 ENCOUNTER — Encounter: Payer: Self-pay | Admitting: Cardiothoracic Surgery

## 2013-11-14 ENCOUNTER — Ambulatory Visit (INDEPENDENT_AMBULATORY_CARE_PROVIDER_SITE_OTHER): Payer: Medicare Other | Admitting: Cardiothoracic Surgery

## 2013-11-14 ENCOUNTER — Telehealth: Payer: Self-pay | Admitting: Physician Assistant

## 2013-11-14 VITALS — BP 100/65 | HR 82 | Resp 20 | Ht 74.0 in | Wt 194.0 lb

## 2013-11-14 DIAGNOSIS — C3410 Malignant neoplasm of upper lobe, unspecified bronchus or lung: Secondary | ICD-10-CM

## 2013-11-14 NOTE — Progress Notes (Signed)
[address]       [place] [address]             [phone]      Yer K Cogliano [facility] Medical Record #[id number] Date of Birth: January 29, 1941  Referring: Iona Beard, MD Primary Care: [name], MD  Chief Complaint:   POST OP FOLLOW UP 07/30/2013  OPERATIVE REPORT  PREOPERATIVE DIAGNOSIS: Non-small cell lung cancer, right upper lobe  with chest wall invasion.  POSTOPERATIVE DIAGNOSIS: Non-small cell lung cancer, right upper lobe  with chest wall invasion.  SURGICAL PROCEDURE: Bronchoscopy, mediastinoscopy, right posterior  lateral thoracotomy with right upper lobectomy, lymph node dissection,  and posterior approach to en bloc chest wall resection, ribs 2, 3, and  4.  History of Present Illness:     Patient returns to the office stay in followup after his right upper lobectomy lymph node dissection on block resection of the upper right chest wall. Because of positive bronchial  margin the patient has undergone further radiation therapy. Patient was seen at the cancer center yesterday was hypotensive and dehydrated and was given IV fluid. He feels better today. He continues to deny any left chest wall pain.    Past Medical History  Diagnosis Date  . Hypercholesteremia 05/14/2011  . BPH (benign prostatic hyperplasia) 05/14/2011    takes Uroxatral daily  . GERD (gastroesophageal reflux disease)   . 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 Cancer     squamous cell carcinoma right superior sulcus  . Lung cancer     right lung  . Shortness of breath     weather related  . Pneumonia 1965  . Headache(784.0)     occasionally  . Joint pain   . Maintenance chemotherapy   . Urinary frequency   . History of shingles      History  Smoking status  . Former Smoker -- 1.00 packs/day for 42 years  . Types: Cigarettes  Smokeless tobacco  . Never Used    Comment: quit smoking almost 46yrs ago    History  Alcohol Use  . Yes   Comment: occasionally beer(12 pk last a month)     No Known Allergies  Current Outpatient Prescriptions  Medication Sig Dispense Refill  . alfuzosin (UROXATRAL) 10 MG 24 hr tablet Take 10 mg by mouth daily with breakfast.      . cycloSPORINE (RESTASIS) 0.05 % ophthalmic emulsion 1 drop 2 (two) times daily.      Marland Kitchen HYDROcodone-homatropine (HYCODAN) 5-1.5 MG/5ML syrup Take 5 mLs by mouth every 6 (six) hours as needed for cough.  240 mL  0  . Multiple Vitamin (MULITIVITAMIN WITH MINERALS) TABS Take 1 tablet by mouth daily.      Marland Kitchen oxyCODONE-acetaminophen (PERCOCET/ROXICET) 5-325 MG per tablet Take 1 tablet by mouth every 6 (six) hours as needed for severe pain.  60 tablet  0  . polyethylene glycol (MIRALAX / GLYCOLAX) packet Take 17 g by mouth daily.      . prochlorperazine (COMPAZINE) 5 MG tablet Take 5 mg by mouth every 6 (six) hours as needed for nausea or vomiting.       No current facility-administered medications for this visit.       Physical Exam: BP 100/65  Pulse 82  Resp 20  Ht 6\' 2"  (1.88 m)  Wt 194 lb (87.998 kg)  BMI 24.90 kg/m2  SpO2 97%  General appearance: alert and cooperative  Neurologic: intact Heart: regular rate and rhythm, S1, S2 normal, no murmur, click, rub or gallop Lungs: clear to auscultation bilaterally Abdomen: soft, non-tender; bowel sounds normal; no masses,  no organomegaly Extremities: extremities normal, atraumatic, no cyanosis or edema and Homans sign is negative, no sign of DVT Wound: His right thoracotomy incision is well-healed, I do not appreciate cervical supraclavicular or axillary adenopathy, he has a palpable lipoma in the left chest that has been present for many years. He has no palpable point tenderness around the left chest wall in the area of question on chest x-ray   Diagnostic Studies & Laboratory data:     Recent Radiology Findings:   No results found.    Recent Lab Findings: Lab Results  Component Value Date   WBC 2.4*  11/11/2013   HGB 10.7* 11/11/2013   HCT 32.2* 11/11/2013   PLT 104* 11/11/2013   GLUCOSE 111 11/11/2013   CHOL 161 05/14/2011   TRIG 146 05/14/2011   HDL 49 05/14/2011   LDLCALC 83 05/14/2011   ALT 16 11/11/2013   AST 18 11/11/2013   NA 138 11/11/2013   K 4.2 11/11/2013   CL 106 11/04/2013   CREATININE 0.8 11/11/2013   BUN 13.4 11/11/2013   CO2 25 11/11/2013   INR 0.95 10/24/2013      Assessment / Plan:       T3 N2 M0 Squamous Cell Carcinoma of the Right Superior Sulcus, now metastatic to the left chest wall  Undergoing current chemotherapy. I plan to see him back in 6 weeks just after a followup CT scan is scheduled by oncology.  Grace Isaac MD      [address] [place] [address] Office [phone]   Beeper [phone]  11/14/2013 9:50 AM

## 2013-11-14 NOTE — Telephone Encounter (Signed)
Lft msg for pt on 08/18 per 08/13 POF, pt to pick up updated sch at next visit and lft msg to sch referral....KJ

## 2013-11-18 ENCOUNTER — Ambulatory Visit: Payer: Medicare Other

## 2013-11-18 ENCOUNTER — Other Ambulatory Visit (HOSPITAL_BASED_OUTPATIENT_CLINIC_OR_DEPARTMENT_OTHER): Payer: Medicare Other

## 2013-11-18 ENCOUNTER — Ambulatory Visit (HOSPITAL_BASED_OUTPATIENT_CLINIC_OR_DEPARTMENT_OTHER): Payer: Medicare Other

## 2013-11-18 VITALS — BP 109/54 | HR 84 | Temp 97.7°F

## 2013-11-18 DIAGNOSIS — C7951 Secondary malignant neoplasm of bone: Secondary | ICD-10-CM

## 2013-11-18 DIAGNOSIS — C3410 Malignant neoplasm of upper lobe, unspecified bronchus or lung: Secondary | ICD-10-CM

## 2013-11-18 DIAGNOSIS — C782 Secondary malignant neoplasm of pleura: Secondary | ICD-10-CM

## 2013-11-18 DIAGNOSIS — C3492 Malignant neoplasm of unspecified part of left bronchus or lung: Secondary | ICD-10-CM

## 2013-11-18 DIAGNOSIS — Z95828 Presence of other vascular implants and grafts: Secondary | ICD-10-CM

## 2013-11-18 DIAGNOSIS — Z5111 Encounter for antineoplastic chemotherapy: Secondary | ICD-10-CM

## 2013-11-18 DIAGNOSIS — C7952 Secondary malignant neoplasm of bone marrow: Secondary | ICD-10-CM

## 2013-11-18 DIAGNOSIS — C3411 Malignant neoplasm of upper lobe, right bronchus or lung: Secondary | ICD-10-CM

## 2013-11-18 LAB — CBC WITH DIFFERENTIAL/PLATELET
BASO%: 0.7 % (ref 0.0–2.0)
Basophils Absolute: 0 10*3/uL (ref 0.0–0.1)
EOS%: 0.6 % (ref 0.0–7.0)
Eosinophils Absolute: 0 10*3/uL (ref 0.0–0.5)
HCT: 30.4 % — ABNORMAL LOW (ref 38.4–49.9)
HGB: 10.3 g/dL — ABNORMAL LOW (ref 13.0–17.1)
LYMPH%: 24.6 % (ref 14.0–49.0)
MCH: 32.4 pg (ref 27.2–33.4)
MCHC: 33.9 g/dL (ref 32.0–36.0)
MCV: 95.8 fL (ref 79.3–98.0)
MONO#: 0.2 10*3/uL (ref 0.1–0.9)
MONO%: 8.7 % (ref 0.0–14.0)
NEUT#: 1.4 10*3/uL — ABNORMAL LOW (ref 1.5–6.5)
NEUT%: 65.4 % (ref 39.0–75.0)
PLATELETS: 103 10*3/uL — AB (ref 140–400)
RBC: 3.17 10*6/uL — ABNORMAL LOW (ref 4.20–5.82)
RDW: 14.1 % (ref 11.0–14.6)
WBC: 2.1 10*3/uL — AB (ref 4.0–10.3)
lymph#: 0.5 10*3/uL — ABNORMAL LOW (ref 0.9–3.3)

## 2013-11-18 LAB — COMPREHENSIVE METABOLIC PANEL (CC13)
ALT: 19 U/L (ref 0–55)
ANION GAP: 7 meq/L (ref 3–11)
AST: 21 U/L (ref 5–34)
Albumin: 3.3 g/dL — ABNORMAL LOW (ref 3.5–5.0)
Alkaline Phosphatase: 113 U/L (ref 40–150)
BUN: 15.9 mg/dL (ref 7.0–26.0)
CO2: 25 mEq/L (ref 22–29)
CREATININE: 0.8 mg/dL (ref 0.7–1.3)
Calcium: 9.3 mg/dL (ref 8.4–10.4)
Chloride: 106 mEq/L (ref 98–109)
GLUCOSE: 109 mg/dL (ref 70–140)
Potassium: 4 mEq/L (ref 3.5–5.1)
Sodium: 138 mEq/L (ref 136–145)
Total Bilirubin: 0.45 mg/dL (ref 0.20–1.20)
Total Protein: 6.5 g/dL (ref 6.4–8.3)

## 2013-11-18 MED ORDER — DEXAMETHASONE SODIUM PHOSPHATE 10 MG/ML IJ SOLN
INTRAMUSCULAR | Status: AC
  Filled 2013-11-18: qty 1

## 2013-11-18 MED ORDER — PACLITAXEL PROTEIN-BOUND CHEMO INJECTION 100 MG
100.0000 mg/m2 | Freq: Once | INTRAVENOUS | Status: AC
  Administered 2013-11-18: 225 mg via INTRAVENOUS
  Filled 2013-11-18: qty 45

## 2013-11-18 MED ORDER — SODIUM CHLORIDE 0.9 % IJ SOLN
10.0000 mL | INTRAMUSCULAR | Status: DC | PRN
  Administered 2013-11-18: 10 mL
  Filled 2013-11-18: qty 10

## 2013-11-18 MED ORDER — DEXAMETHASONE SODIUM PHOSPHATE 10 MG/ML IJ SOLN
10.0000 mg | Freq: Once | INTRAMUSCULAR | Status: AC
  Administered 2013-11-18: 10 mg via INTRAVENOUS

## 2013-11-18 MED ORDER — SODIUM CHLORIDE 0.9 % IV SOLN
Freq: Once | INTRAVENOUS | Status: AC
  Administered 2013-11-18: 11:00:00 via INTRAVENOUS

## 2013-11-18 MED ORDER — SODIUM CHLORIDE 0.9 % IJ SOLN
10.0000 mL | INTRAMUSCULAR | Status: DC | PRN
  Administered 2013-11-18: 10 mL via INTRAVENOUS
  Filled 2013-11-18: qty 10

## 2013-11-18 MED ORDER — ONDANSETRON 8 MG/NS 50 ML IVPB
INTRAVENOUS | Status: AC
  Filled 2013-11-18: qty 8

## 2013-11-18 MED ORDER — ONDANSETRON 8 MG/50ML IVPB (CHCC)
8.0000 mg | Freq: Once | INTRAVENOUS | Status: AC
  Administered 2013-11-18: 8 mg via INTRAVENOUS

## 2013-11-18 MED ORDER — HEPARIN SOD (PORK) LOCK FLUSH 100 UNIT/ML IV SOLN
500.0000 [IU] | Freq: Once | INTRAVENOUS | Status: AC | PRN
  Administered 2013-11-18: 500 [IU]
  Filled 2013-11-18: qty 5

## 2013-11-18 NOTE — Progress Notes (Signed)
Ok to treat today per Dr Julien Nordmann.  Informed patient that more than likely counts will be too low to treat next week.  No need to wait on CMET.

## 2013-11-18 NOTE — Patient Instructions (Signed)
[facility] Discharge Instructions for Patients Receiving Chemotherapy  Today you received the following chemotherapy agents abraxane  To help prevent nausea and vomiting after your treatment, we encourage you to take your nausea medication as directed   If you develop nausea and vomiting that is not controlled by your nausea medication, call the clinic.   BELOW ARE SYMPTOMS THAT SHOULD BE REPORTED IMMEDIATELY:  *FEVER GREATER THAN 100.5 F  *CHILLS WITH OR WITHOUT FEVER  NAUSEA AND VOMITING THAT IS NOT CONTROLLED WITH YOUR NAUSEA MEDICATION  *UNUSUAL SHORTNESS OF BREATH  *UNUSUAL BRUISING OR BLEEDING  TENDERNESS IN MOUTH AND THROAT WITH OR WITHOUT PRESENCE OF ULCERS  *URINARY PROBLEMS  *BOWEL PROBLEMS  UNUSUAL RASH Items with * indicate a potential emergency and should be followed up as soon as possible.  Feel free to call the clinic you have any questions or concerns. The clinic phone number is [phone].

## 2013-11-18 NOTE — Patient Instructions (Signed)

## 2013-11-21 ENCOUNTER — Ambulatory Visit: Payer: Medicare Other | Admitting: Radiation Oncology

## 2013-11-25 ENCOUNTER — Encounter: Payer: Self-pay | Admitting: Internal Medicine

## 2013-11-25 ENCOUNTER — Ambulatory Visit: Payer: Medicare Other

## 2013-11-25 ENCOUNTER — Other Ambulatory Visit (HOSPITAL_BASED_OUTPATIENT_CLINIC_OR_DEPARTMENT_OTHER): Payer: Medicare Other

## 2013-11-25 DIAGNOSIS — C3492 Malignant neoplasm of unspecified part of left bronchus or lung: Secondary | ICD-10-CM

## 2013-11-25 DIAGNOSIS — Z95828 Presence of other vascular implants and grafts: Secondary | ICD-10-CM

## 2013-11-25 DIAGNOSIS — C3410 Malignant neoplasm of upper lobe, unspecified bronchus or lung: Secondary | ICD-10-CM

## 2013-11-25 LAB — CBC WITH DIFFERENTIAL/PLATELET
BASO%: 0 % (ref 0.0–2.0)
BASOS ABS: 0 10*3/uL (ref 0.0–0.1)
EOS%: 0.6 % (ref 0.0–7.0)
Eosinophils Absolute: 0 10*3/uL (ref 0.0–0.5)
HCT: 29.5 % — ABNORMAL LOW (ref 38.4–49.9)
HGB: 10.1 g/dL — ABNORMAL LOW (ref 13.0–17.1)
LYMPH%: 24.1 % (ref 14.0–49.0)
MCH: 32.6 pg (ref 27.2–33.4)
MCHC: 34.2 g/dL (ref 32.0–36.0)
MCV: 95.2 fL (ref 79.3–98.0)
MONO#: 0.3 10*3/uL (ref 0.1–0.9)
MONO%: 20.9 % — AB (ref 0.0–14.0)
NEUT#: 0.9 10*3/uL — ABNORMAL LOW (ref 1.5–6.5)
NEUT%: 54.4 % (ref 39.0–75.0)
PLATELETS: 66 10*3/uL — AB (ref 140–400)
RBC: 3.1 10*6/uL — AB (ref 4.20–5.82)
RDW: 14.2 % (ref 11.0–14.6)
WBC: 1.6 10*3/uL — ABNORMAL LOW (ref 4.0–10.3)
lymph#: 0.4 10*3/uL — ABNORMAL LOW (ref 0.9–3.3)

## 2013-11-25 LAB — COMPREHENSIVE METABOLIC PANEL (CC13)
ALK PHOS: 122 U/L (ref 40–150)
ALT: 29 U/L (ref 0–55)
ANION GAP: 8 meq/L (ref 3–11)
AST: 23 U/L (ref 5–34)
Albumin: 3.2 g/dL — ABNORMAL LOW (ref 3.5–5.0)
BUN: 8.7 mg/dL (ref 7.0–26.0)
CO2: 26 meq/L (ref 22–29)
CREATININE: 0.9 mg/dL (ref 0.7–1.3)
Calcium: 9.4 mg/dL (ref 8.4–10.4)
Chloride: 105 mEq/L (ref 98–109)
GLUCOSE: 109 mg/dL (ref 70–140)
Potassium: 3.8 mEq/L (ref 3.5–5.1)
SODIUM: 139 meq/L (ref 136–145)
TOTAL PROTEIN: 6.7 g/dL (ref 6.4–8.3)
Total Bilirubin: 0.6 mg/dL (ref 0.20–1.20)

## 2013-11-25 NOTE — Progress Notes (Signed)
Pt refused port a cath access at this time. States he would like for the labs to be drawn from his arm.

## 2013-11-25 NOTE — Progress Notes (Signed)
Hold treatment today per Dr. Julien Nordmann due to counts.  Spoke to patient in lobby, reviewed counts, pt to return on 9/8.  Pt verbalized understanding.  Instructed patient to avoid crowds and monitor self for bleeding due to low platelet count.  Pt verbalized understanding.

## 2013-11-26 ENCOUNTER — Other Ambulatory Visit: Payer: Self-pay | Admitting: *Deleted

## 2013-11-26 DIAGNOSIS — R0602 Shortness of breath: Secondary | ICD-10-CM

## 2013-11-26 DIAGNOSIS — M79632 Pain in left forearm: Secondary | ICD-10-CM

## 2013-11-26 DIAGNOSIS — I771 Stricture of artery: Secondary | ICD-10-CM

## 2013-11-27 ENCOUNTER — Telehealth: Payer: Self-pay | Admitting: *Deleted

## 2013-11-27 ENCOUNTER — Other Ambulatory Visit: Payer: Self-pay | Admitting: Physician Assistant

## 2013-11-27 DIAGNOSIS — C3410 Malignant neoplasm of upper lobe, unspecified bronchus or lung: Secondary | ICD-10-CM

## 2013-11-27 DIAGNOSIS — C3411 Malignant neoplasm of upper lobe, right bronchus or lung: Secondary | ICD-10-CM

## 2013-11-27 MED ORDER — OXYCODONE-ACETAMINOPHEN 5-325 MG PO TABS: 1.0000 | ORAL_TABLET | Freq: Four times a day (QID) | ORAL | Status: DC | PRN

## 2013-11-27 MED ORDER — HYDROCODONE-HOMATROPINE 5-1.5 MG/5ML PO SYRP: 5.0000 mL | ORAL_SOLUTION | Freq: Four times a day (QID) | ORAL | Status: DC | PRN

## 2013-11-27 NOTE — Telephone Encounter (Signed)
Reviewed pt's message from MyChart requesting refill of Percocet and Hycodan.  Spoke with pt and was informed that pt is not out of these meds.  Pt would like to pick up prescriptions when he comes for office visit on 12/03/13.

## 2013-12-03 ENCOUNTER — Telehealth: Payer: Self-pay | Admitting: Physician Assistant

## 2013-12-03 ENCOUNTER — Ambulatory Visit (HOSPITAL_BASED_OUTPATIENT_CLINIC_OR_DEPARTMENT_OTHER): Payer: Medicare Other | Admitting: Physician Assistant

## 2013-12-03 ENCOUNTER — Ambulatory Visit: Payer: Medicare Other

## 2013-12-03 ENCOUNTER — Ambulatory Visit (HOSPITAL_BASED_OUTPATIENT_CLINIC_OR_DEPARTMENT_OTHER): Payer: Medicare Other

## 2013-12-03 ENCOUNTER — Other Ambulatory Visit (HOSPITAL_BASED_OUTPATIENT_CLINIC_OR_DEPARTMENT_OTHER): Payer: Medicare Other

## 2013-12-03 ENCOUNTER — Encounter: Payer: Self-pay | Admitting: Physician Assistant

## 2013-12-03 VITALS — BP 106/58 | HR 85 | Temp 98.5°F | Resp 18 | Ht 74.0 in | Wt 192.4 lb

## 2013-12-03 DIAGNOSIS — C3411 Malignant neoplasm of upper lobe, right bronchus or lung: Secondary | ICD-10-CM

## 2013-12-03 DIAGNOSIS — Z5111 Encounter for antineoplastic chemotherapy: Secondary | ICD-10-CM

## 2013-12-03 DIAGNOSIS — C3492 Malignant neoplasm of unspecified part of left bronchus or lung: Secondary | ICD-10-CM

## 2013-12-03 DIAGNOSIS — C3410 Malignant neoplasm of upper lobe, unspecified bronchus or lung: Secondary | ICD-10-CM

## 2013-12-03 DIAGNOSIS — Z23 Encounter for immunization: Secondary | ICD-10-CM

## 2013-12-03 DIAGNOSIS — Z95828 Presence of other vascular implants and grafts: Secondary | ICD-10-CM

## 2013-12-03 LAB — CBC WITH DIFFERENTIAL/PLATELET
BASO%: 1 % (ref 0.0–2.0)
Basophils Absolute: 0 10*3/uL (ref 0.0–0.1)
EOS%: 0.7 % (ref 0.0–7.0)
Eosinophils Absolute: 0 10*3/uL (ref 0.0–0.5)
HEMATOCRIT: 29.6 % — AB (ref 38.4–49.9)
HEMOGLOBIN: 9.9 g/dL — AB (ref 13.0–17.1)
LYMPH%: 18.4 % (ref 14.0–49.0)
MCH: 32.2 pg (ref 27.2–33.4)
MCHC: 33.3 g/dL (ref 32.0–36.0)
MCV: 96.6 fL (ref 79.3–98.0)
MONO#: 0.6 10*3/uL (ref 0.1–0.9)
MONO%: 16.4 % — ABNORMAL HIGH (ref 0.0–14.0)
NEUT#: 2.4 10*3/uL (ref 1.5–6.5)
NEUT%: 63.5 % (ref 39.0–75.0)
Platelets: 94 10*3/uL — ABNORMAL LOW (ref 140–400)
RBC: 3.06 10*6/uL — ABNORMAL LOW (ref 4.20–5.82)
RDW: 15.1 % — ABNORMAL HIGH (ref 11.0–14.6)
WBC: 3.8 10*3/uL — AB (ref 4.0–10.3)
lymph#: 0.7 10*3/uL — ABNORMAL LOW (ref 0.9–3.3)

## 2013-12-03 LAB — COMPREHENSIVE METABOLIC PANEL (CC13)
ALBUMIN: 3.1 g/dL — AB (ref 3.5–5.0)
ALT: 13 U/L (ref 0–55)
ANION GAP: 9 meq/L (ref 3–11)
AST: 19 U/L (ref 5–34)
Alkaline Phosphatase: 123 U/L (ref 40–150)
BUN: 13.2 mg/dL (ref 7.0–26.0)
CO2: 24 meq/L (ref 22–29)
CREATININE: 0.8 mg/dL (ref 0.7–1.3)
Calcium: 9.3 mg/dL (ref 8.4–10.4)
Chloride: 107 mEq/L (ref 98–109)
Glucose: 106 mg/dl (ref 70–140)
Potassium: 3.8 mEq/L (ref 3.5–5.1)
Sodium: 139 mEq/L (ref 136–145)
TOTAL PROTEIN: 6.6 g/dL (ref 6.4–8.3)
Total Bilirubin: 0.41 mg/dL (ref 0.20–1.20)

## 2013-12-03 MED ORDER — ONDANSETRON 8 MG/NS 50 ML IVPB
INTRAVENOUS | Status: AC
  Filled 2013-12-03: qty 8

## 2013-12-03 MED ORDER — ONDANSETRON 8 MG/50ML IVPB (CHCC)
8.0000 mg | Freq: Once | INTRAVENOUS | Status: DC
  Administered 2013-12-03: 8 mg via INTRAVENOUS

## 2013-12-03 MED ORDER — SODIUM CHLORIDE 0.9 % IJ SOLN
10.0000 mL | INTRAMUSCULAR | Status: DC | PRN
  Administered 2013-12-03: 10 mL
  Filled 2013-12-03: qty 10

## 2013-12-03 MED ORDER — ONDANSETRON 8 MG/50ML IVPB (CHCC)
8.0000 mg | Freq: Once | INTRAVENOUS | Status: AC
  Administered 2013-12-03: 8 mg via INTRAVENOUS

## 2013-12-03 MED ORDER — INFLUENZA VAC SPLIT QUAD 0.5 ML IM SUSY
0.5000 mL | PREFILLED_SYRINGE | Freq: Once | INTRAMUSCULAR | Status: AC
  Administered 2013-12-03: 0.5 mL via INTRAMUSCULAR
  Filled 2013-12-03: qty 0.5

## 2013-12-03 MED ORDER — DEXAMETHASONE SODIUM PHOSPHATE 10 MG/ML IJ SOLN
INTRAMUSCULAR | Status: AC
  Filled 2013-12-03: qty 1

## 2013-12-03 MED ORDER — SODIUM CHLORIDE 0.9 % IV SOLN
548.0000 mg | Freq: Once | INTRAVENOUS | Status: AC
  Administered 2013-12-03: 550 mg via INTRAVENOUS
  Filled 2013-12-03: qty 55

## 2013-12-03 MED ORDER — HEPARIN SOD (PORK) LOCK FLUSH 100 UNIT/ML IV SOLN
500.0000 [IU] | Freq: Once | INTRAVENOUS | Status: AC | PRN
  Administered 2013-12-03: 500 [IU]
  Filled 2013-12-03: qty 5

## 2013-12-03 MED ORDER — SODIUM CHLORIDE 0.9 % IJ SOLN
10.0000 mL | INTRAMUSCULAR | Status: DC | PRN
  Administered 2013-12-03: 10 mL via INTRAVENOUS
  Filled 2013-12-03: qty 10

## 2013-12-03 MED ORDER — HEPARIN SOD (PORK) LOCK FLUSH 100 UNIT/ML IV SOLN
500.0000 [IU] | Freq: Once | INTRAVENOUS | Status: DC | PRN
  Filled 2013-12-03: qty 5

## 2013-12-03 MED ORDER — DEXAMETHASONE SODIUM PHOSPHATE 10 MG/ML IJ SOLN
10.0000 mg | Freq: Once | INTRAMUSCULAR | Status: AC
  Administered 2013-12-03: 10 mg via INTRAVENOUS

## 2013-12-03 MED ORDER — PACLITAXEL PROTEIN-BOUND CHEMO INJECTION 100 MG
100.0000 mg/m2 | Freq: Once | INTRAVENOUS | Status: AC
  Administered 2013-12-03: 225 mg via INTRAVENOUS
  Filled 2013-12-03: qty 45

## 2013-12-03 MED ORDER — SODIUM CHLORIDE 0.9 % IV SOLN
Freq: Once | INTRAVENOUS | Status: AC
  Administered 2013-12-03: 13:00:00 via INTRAVENOUS

## 2013-12-03 MED ORDER — PACLITAXEL PROTEIN-BOUND CHEMO INJECTION 100 MG
100.0000 mg/m2 | Freq: Once | INTRAVENOUS | Status: DC
  Filled 2013-12-03: qty 45

## 2013-12-03 MED ORDER — SODIUM CHLORIDE 0.9 % IJ SOLN
10.0000 mL | INTRAMUSCULAR | Status: DC | PRN
  Filled 2013-12-03: qty 10

## 2013-12-03 NOTE — Patient Instructions (Signed)
[facility] Discharge Instructions for Patients Receiving Chemotherapy  Today you received the following chemotherapy agents Abraxane and Carboplatin. To help prevent nausea and vomiting after your treatment, we encourage you to take your nausea medication Compazine 5 mg every 6 hours as needed. If you develop nausea and vomiting that is not controlled by your nausea medication, call the clinic.   BELOW ARE SYMPTOMS THAT SHOULD BE REPORTED IMMEDIATELY:  *FEVER GREATER THAN 100.5 F  *CHILLS WITH OR WITHOUT FEVER  NAUSEA AND VOMITING THAT IS NOT CONTROLLED WITH YOUR NAUSEA MEDICATION  *UNUSUAL SHORTNESS OF BREATH  *UNUSUAL BRUISING OR BLEEDING  TENDERNESS IN MOUTH AND THROAT WITH OR WITHOUT PRESENCE OF ULCERS  *URINARY PROBLEMS  *BOWEL PROBLEMS  UNUSUAL RASH Items with * indicate a potential emergency and should be followed up as soon as possible.  Feel free to call the clinic you have any questions or concerns. The clinic phone number is [phone].

## 2013-12-03 NOTE — Telephone Encounter (Signed)
Pt confirmed labs/ov per 09/08 PO, sent msg to add chemo,gave pt AVS......kJ

## 2013-12-03 NOTE — Progress Notes (Addendum)
[place]  [phone] [phone]  SHARED VISIT PROGRESS NOTE  [name], MD 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   Primary site: Lung (Right)   Staging method: AJCC 7th Edition   Clinical: Stage IIIA (T3, N2, M0) signed by Curt Bears, MD on 05/09/2013  2:56 PM   Summary: Stage IIIA (T3, N2, M0)  PRIOR THERAPY:  1) Concurrent chemoradiation with weekly carboplatin for an AUC of 2 and paclitaxel 45 mg per meter squared. Status post 5 cycles. 2) Bronchoscopy, mediastinoscopy, right posterior lateral thoracotomy with right upper lobectomy, lymph node dissection,  and posterior approach to en bloc chest wall resection, ribs 2, 3, and 4, with positive bronchial resection margin. 3) palliative radiotherapy to the best of resection margin under the care of Dr. Tammi Klippel completed on 10/14/2013.  CURRENT THERAPY: Systemic chemotherapy with carboplatin for AUC of 5 on day 1 and Abraxane 100 mg/M2 on days 1, 8 and 15 every 3 weeks. First cycle 10/21/2013. 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    INTERVAL HISTORY: Terry Robles  73 y.o. male returns for followup visit. He is currently being treated with systemic chemotherapy in the form of carboplatin and Abraxane. Status post 2 cycles. He is tolerating this chemotherapy relatively well. He reports some shortness of breath with exertion as well as some mild lightheadedness with change in position. He reports some pain on his left side as well as on the right side of his chest. He rates the pain as a 4-5 on a 0-10 scale. This is significantly decreased with Percocet. He requests a flu shot today.  He is feeling fine today with no other specific complaints. He denies significant chest pain. He has no hemoptysis. He denied having any significant weight loss or night sweats. He has no nausea or vomiting.    MEDICAL HISTORY: Past Medical History  Diagnosis Date  . Hypercholesteremia 05/14/2011  . BPH (benign prostatic hyperplasia) 05/14/2011    takes Uroxatral daily  . GERD (gastroesophageal reflux disease)   . 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 Cancer     squamous cell carcinoma right superior sulcus  . Lung cancer     right lung  . Shortness of breath     weather related  . Pneumonia 1965  . Headache(784.0)     occasionally  . Joint pain   . Maintenance chemotherapy   . Urinary frequency   . History of shingles     ALLERGIES:  has No Known Allergies.  MEDICATIONS:  Current Outpatient Prescriptions  Medication Sig Dispense Refill  . alfuzosin (UROXATRAL) 10 MG 24 hr tablet  Take 10 mg by mouth daily with breakfast.      . cycloSPORINE (RESTASIS) 0.05 % ophthalmic emulsion 1 drop 2 (two) times daily.      Marland Kitchen HYDROcodone-homatropine (HYCODAN) 5-1.5 MG/5ML syrup Take 5 mLs by mouth every 6 (six) hours as needed for cough.  240 mL  0  . Multiple Vitamin (MULITIVITAMIN WITH MINERALS) TABS Take 1 tablet by mouth daily.      Marland Kitchen oxyCODONE-acetaminophen (PERCOCET/ROXICET) 5-325 MG per tablet Take 1 tablet by mouth every 6 (six) hours as needed for severe  pain.  60 tablet  0  . polyethylene glycol (MIRALAX / GLYCOLAX) packet Take 17 g by mouth daily.      . prochlorperazine (COMPAZINE) 5 MG tablet Take 5 mg by mouth every 6 (six) hours as needed for nausea or vomiting.       No current facility-administered medications for this visit.   Facility-Administered Medications Ordered in Other Visits  Medication Dose Route Frequency Provider Last Rate Last Dose  . sodium chloride 0.9 % injection 10 mL  10 mL Intracatheter PRN Curt Bears, MD   10 mL at 12/03/13 1541    SURGICAL HISTORY:  Past Surgical History  Procedure Laterality Date  . Other surgical history      benign tumor removed from left leg several years ago  . Other surgical history      had melanoma/squamous cell removed previously per patient  . Tumor excision      Left knee  . Colonoscopy w/ biopsies and polypectomy    . Video bronchoscopy N/A 07/30/2013    Procedure: VIDEO BRONCHOSCOPY;  Surgeon: Grace Isaac, MD;  Location: [facility];  Service: Thoracic;  Laterality: N/A;  . Mediastinoscopy N/A 07/30/2013    Procedure: MEDIASTINOSCOPY;  Surgeon: Grace Isaac, MD;  Location: [facility];  Service: Thoracic;  Laterality: N/A;  . Video assisted thoracoscopy (vats)/wedge resection Right 07/30/2013    Procedure: VIDEO ASSISTED THORACOSCOPY;  Surgeon: Grace Isaac, MD;  Location: [facility];  Service: Thoracic;  Laterality: Right;  . Thoracotomy  07/30/2013    Procedure: THORACOTOMY FOR CHEST WALL RESECTION, RIGHT UPPER LOBECTOMY;  Surgeon: Grace Isaac, MD;  Location: [facility];  Service: Thoracic;;  . Adenoidectomy  as a child  . Tonsillectomy    . Portacath placement Left 10/30/2013    Procedure: INSERTION PORT-A-CATH WITH ULTRASOUND GUIDANCE AND FLUORO;  Surgeon: Grace Isaac, MD;  Location: [facility];  Service: Thoracic;  Laterality: Left;    REVIEW OF SYSTEMS:  Constitutional: positive for fatigue Eyes: negative Ears, nose, mouth, throat, and face: negative Respiratory:  positive for dyspnea on exertion, pleurisy/chest pain and bilateral chest discomfort Cardiovascular: negative Gastrointestinal: positive for nausea Genitourinary:negative Integument/breast: negative Hematologic/lymphatic: negative Musculoskeletal:negative Neurological: positive for dizziness and with position change Behavioral/Psych: negative Endocrine: negative Allergic/Immunologic: negative   PHYSICAL EXAMINATION: General appearance: alert, cooperative, appears stated age and no distress Head: Normocephalic, without obvious abnormality, atraumatic Neck: no adenopathy, no carotid bruit, no JVD, supple, symmetrical, trachea midline and thyroid not enlarged, symmetric, no tenderness/mass/nodules Lymph nodes: Cervical, supraclavicular, and axillary nodes normal. Resp: clear to auscultation bilaterally Back: symmetric, no curvature. ROM normal. No CVA tenderness. Cardio: regular rate and rhythm, S1, S2 normal, no murmur, click, rub or gallop GI: soft, non-tender; bowel sounds normal; no masses,  no organomegaly Extremities: extremities normal, atraumatic, no cyanosis or edema and The left forearm and hand are cooler to touch than the right Neurologic: Alert and oriented X 3, normal strength  and tone. Normal symmetric reflexes. Normal coordination and gait Skin: Left anterior chest Port-A-Cath incisions are well healed without evidence of wound dehiscence or infection  ECOG PERFORMANCE STATUS: 1 - Symptomatic but completely ambulatory  Blood pressure 106/58, pulse 85, temperature 98.5 F (36.9 C), temperature source Oral, resp. rate 18, height 6\' 2"  (1.88 m), weight 192 lb 6.4 oz (87.272 kg), SpO2 99.00%.  LABORATORY DATA: Lab Results  Component Value Date   WBC 3.8* 12/03/2013   HGB 9.9* 12/03/2013   HCT 29.6* 12/03/2013   MCV 96.6 12/03/2013   PLT 94* 12/03/2013      Chemistry      Component Value Date/Time   NA 139 12/03/2013 0957   NA 141 11/04/2013 1047   K 3.8 12/03/2013 0957   K 4.0  11/04/2013 1047   CL 106 11/04/2013 1047   CO2 24 12/03/2013 0957   CO2 24 11/04/2013 1047   BUN 13.2 12/03/2013 0957   BUN 15 11/04/2013 1047   CREATININE 0.8 12/03/2013 0957   CREATININE 0.72 11/04/2013 1047      Component Value Date/Time   CALCIUM 9.3 12/03/2013 0957   CALCIUM 9.4 11/04/2013 1047   ALKPHOS 123 12/03/2013 0957   ALKPHOS 125* 11/04/2013 1047   AST 19 12/03/2013 0957   AST 21 11/04/2013 1047   ALT 13 12/03/2013 0957   ALT 24 11/04/2013 1047   BILITOT 0.41 12/03/2013 0957   BILITOT 0.2* 11/04/2013 1047       RADIOGRAPHIC STUDIES: Dg Chest 2 View  10/10/2013   CLINICAL DATA:  History right lung cancer, status post surgery 07/2013  EXAM: CHEST  2 VIEW  COMPARISON:  Chest radiograph dated 08/26/2013 CT chest dated 07/24/2013  FINDINGS: Postsurgical changes in the right upper hemithorax with volume loss and increased right apical capping/fluid.  Pleural-based lesion along the left lateral hemithorax, new/increased, with destruction of the left lateral 7th rib.  No pneumothorax.  The heart is normal in size.  Degenerative changes of the visualized thoracolumbar spine.  IMPRESSION: Postsurgical changes in the right upper hemithorax with increased right apical capping/fluid.  Pleural-based lesion along the left lateral hemithorax, new/increased, with destruction of the left lateral 7th rib.  Consider CT chest with contrast for further evaluation.  These results will be called to the ordering clinician or representative by the Radiologist Assistant, and communication documented in the PACS or zVision Dashboard.   Electronically Signed   By: Julian Hy M.D.   On: 10/10/2013 09:42   Ct Chest W Contrast  10/11/2013   CLINICAL DATA:  Followup metastatic lung carcinoma. Recent right thoracotomy. On going radiation therapy. Recently completed chemotherapy.  EXAM: CT CHEST WITH CONTRAST  TECHNIQUE: Multidetector CT imaging of the chest was performed during intravenous contrast administration.  CONTRAST:   53mL OMNIPAQUE IOHEXOL 300 MG/ML  SOLN  COMPARISON:  07/24/2013  FINDINGS: Patient has undergone right thoracotomy and right upper lobectomy since prior exam. Parenchymal consolidation and central air bronchograms is seen in the right posterior upper lung field, with surgical clips in the adjacent right hilum. 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 A small right pleural effusion is noted, however there is no evidence of associated pleural nodularity or chest wall mass. No suspicious pulmonary nodules or masses are seen involving either lung. Mild emphysema noted. No evidence of left-sided pleural effusion.  No hilar lymphadenopathy identified. Mild mediastinal adenopathy in the subcarinal region shows increased since previous study, with lymph node measuring 16 mm on image 27 compared  with 7 mm previously. No other pathologically enlarged nodes are seen within the thorax.  A left posterior chest wall mass is seen with destruction of left posterior lateral seventh rib. This measures 3.6 x 2.7 cm on image 30. This is new since previous study and consistent with a lytic bone metastasis. No other suspicious bone lesions seen within the thorax.  Both adrenal glands are normal in appearance. Tiny calcified gallstones again seen, however there is no evidence of cholecystitis. A subcutaneous lesion is seen in the left anterior chest wall measuring 1.7 x 5.3 cm which is stable and consistent with a sebaceous cyst.  IMPRESSION: Postop changes from interval right upper lobectomy. 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 Small right pleural effusion also noted.  Increased mild mediastinal lymphadenopathy in the subcarinal region, suspicious for metastatic disease.  New lytic bone metastasis with associated chest wall mass involving the left posterior seventh rib.   Electronically Signed   By: Earle Gell M.D.   On: 10/11/2013 14:39    ASSESSMENT/PLAN:  This is a very pleasant 73 years old white male recently diagnosed with a stage IIIA non-small cell lung cancer. He completed a course of concurrent chemoradiation with weekly carboplatin and paclitaxel status post 5 cycle, this was followed by right upper lobectomy with lymph node dissection under the care of Dr. Servando Snare. The final pathology showed positive bronchial resection margin with a pathologic stage ypT3, ypN0. The patient underwent a course of radiotherapy to the positive resection margin but unfortunately the recent CT scan of the chest showed evidence for disease progression with new lytic rib lesion as well as enlarged mediastinal lymphadenopathy. The MRI of the brain was negative for brain metastasis. He is status post 2 cycles of systemic chemotherapy with carboplatin for AUC of 5 on day 1 and Abraxane 100 mg/M2 on days 1, 8 and 15 every 3 weeks. Patient was discussed with and also seen by Dr. Julien Nordmann. He will proceed with day 1 of  cycle #3 of his systemic chemotherapy with carboplatin and Abraxane as scheduled. He will return for another symptom management visit  in 2 weeks.Marland Kitchen He is to follow up with vascular surgery to further assess his previous complaints of a cool upper extremity as compared to the other. A referral was made at his last visit.  He was advised to call immediately if he has any concerning symptoms in the interval.  Disclaimer: This note was dictated with voice recognition software. Similar sounding words can inadvertently be transcribed and may not be corrected upon review. Carlton Adam, PA-C 12/03/2013  ADDENDUM: Hematology/Oncology Attending: I had a face to face encounter with the patient. I recommended his care plan. This is a very pleasant 73 years old white male with metastatic non-small cell lung cancer, squamous cell carcinoma who is currently undergoing systemic chemotherapy was carboplatin and Abraxane status post 2 cycles. She  missed day 15 of the second cycle secondary to pancytopenia. His blood count is better today and good enough to proceed with the treatment except for a slightly low platelet count. I discussed the lab result with the patient today. I recommended for him to start with day 1 of cycle #3. He would come back for followup visit in 3 weeks for reevaluation after repeating CT scan of the chest, abdomen and pelvis for restaging of his disease. He was advised to call immediately if he has any concerning symptoms in the interval.  Disclaimer: This note was dictated with voice recognition software. Similar sounding words can inadvertently be transcribed and may be missed upon review. [name]., MD 12/04/2013

## 2013-12-03 NOTE — Patient Instructions (Signed)

## 2013-12-03 NOTE — Patient Instructions (Addendum)

## 2013-12-03 NOTE — Progress Notes (Signed)
Per Awilda Metro, PA; OK to treat with Platelets 94

## 2013-12-04 ENCOUNTER — Telehealth: Payer: Self-pay | Admitting: *Deleted

## 2013-12-04 ENCOUNTER — Encounter: Payer: Self-pay | Admitting: Physician Assistant

## 2013-12-04 NOTE — Telephone Encounter (Signed)
I have adjusted appt and scheduled appt

## 2013-12-05 ENCOUNTER — Emergency Department (HOSPITAL_COMMUNITY): Payer: Medicare Other

## 2013-12-05 ENCOUNTER — Other Ambulatory Visit: Payer: Self-pay | Admitting: *Deleted

## 2013-12-05 ENCOUNTER — Ambulatory Visit: Payer: Medicare Other | Admitting: Physician Assistant

## 2013-12-05 ENCOUNTER — Telehealth: Payer: Self-pay | Admitting: Internal Medicine

## 2013-12-05 ENCOUNTER — Encounter (HOSPITAL_COMMUNITY): Payer: Self-pay | Admitting: Emergency Medicine

## 2013-12-05 ENCOUNTER — Telehealth: Payer: Self-pay

## 2013-12-05 ENCOUNTER — Other Ambulatory Visit: Payer: Medicare Other

## 2013-12-05 ENCOUNTER — Emergency Department (HOSPITAL_COMMUNITY)
Admission: EM | Admit: 2013-12-05 | Discharge: 2013-12-05 | Disposition: A | Discharge status: Home / Self Care | Payer: Medicare Other | Attending: Emergency Medicine | Admitting: Emergency Medicine

## 2013-12-05 DIAGNOSIS — Z79899 Other long term (current) drug therapy: Secondary | ICD-10-CM | POA: Insufficient documentation

## 2013-12-05 DIAGNOSIS — Z85118 Personal history of other malignant neoplasm of bronchus and lung: Secondary | ICD-10-CM | POA: Diagnosis not present

## 2013-12-05 DIAGNOSIS — Z9221 Personal history of antineoplastic chemotherapy: Secondary | ICD-10-CM | POA: Diagnosis not present

## 2013-12-05 DIAGNOSIS — Z85828 Personal history of other malignant neoplasm of skin: Secondary | ICD-10-CM | POA: Insufficient documentation

## 2013-12-05 DIAGNOSIS — K5900 Constipation, unspecified: Secondary | ICD-10-CM | POA: Insufficient documentation

## 2013-12-05 DIAGNOSIS — K5641 Fecal impaction: Secondary | ICD-10-CM | POA: Diagnosis not present

## 2013-12-05 DIAGNOSIS — K219 Gastro-esophageal reflux disease without esophagitis: Secondary | ICD-10-CM | POA: Insufficient documentation

## 2013-12-05 DIAGNOSIS — Z8619 Personal history of other infectious and parasitic diseases: Secondary | ICD-10-CM | POA: Insufficient documentation

## 2013-12-05 DIAGNOSIS — Z87448 Personal history of other diseases of urinary system: Secondary | ICD-10-CM | POA: Diagnosis not present

## 2013-12-05 DIAGNOSIS — Z8701 Personal history of pneumonia (recurrent): Secondary | ICD-10-CM | POA: Insufficient documentation

## 2013-12-05 DIAGNOSIS — Z87891 Personal history of nicotine dependence: Secondary | ICD-10-CM | POA: Diagnosis not present

## 2013-12-05 DIAGNOSIS — Z862 Personal history of diseases of the blood and blood-forming organs and certain disorders involving the immune mechanism: Secondary | ICD-10-CM | POA: Diagnosis not present

## 2013-12-05 DIAGNOSIS — Z8639 Personal history of other endocrine, nutritional and metabolic disease: Secondary | ICD-10-CM | POA: Insufficient documentation

## 2013-12-05 HISTORY — DX: Anemia, unspecified: D64.9

## 2013-12-05 MED ORDER — SODIUM CHLORIDE 0.9 % IV BOLUS (SEPSIS)
500.0000 mL | Freq: Once | INTRAVENOUS | Status: AC
  Administered 2013-12-05: 500 mL via INTRAVENOUS

## 2013-12-05 MED ORDER — ONDANSETRON HCL 4 MG/2ML IJ SOLN
4.0000 mg | Freq: Once | INTRAMUSCULAR | Status: AC
  Administered 2013-12-05: 4 mg via INTRAVENOUS
  Filled 2013-12-05: qty 2

## 2013-12-05 MED ORDER — HEPARIN SOD (PORK) LOCK FLUSH 100 UNIT/ML IV SOLN
500.0000 [IU] | Freq: Once | INTRAVENOUS | Status: AC
  Administered 2013-12-05: 500 [IU] via INTRAVENOUS
  Filled 2013-12-05: qty 5

## 2013-12-05 MED ORDER — FLEET ENEMA 7-19 GM/118ML RE ENEM: 1.0000 | ENEMA | Freq: Once | RECTAL | Status: DC

## 2013-12-05 MED ORDER — MORPHINE SULFATE 4 MG/ML IJ SOLN
4.0000 mg | Freq: Once | INTRAMUSCULAR | Status: AC
  Administered 2013-12-05: 4 mg via INTRAVENOUS
  Filled 2013-12-05: qty 1

## 2013-12-05 MED ORDER — MINERAL OIL RE ENEM
1.0000 | ENEMA | Freq: Once | RECTAL | Status: AC
  Administered 2013-12-05: 1 via RECTAL

## 2013-12-05 MED ORDER — MORPHINE SULFATE 2 MG/ML IJ SOLN
2.0000 mg | Freq: Once | INTRAMUSCULAR | Status: AC
  Administered 2013-12-05: 2 mg via INTRAVENOUS
  Filled 2013-12-05: qty 1

## 2013-12-05 NOTE — Telephone Encounter (Signed)
lvm for pt regarding to Sept appt.....advised pt to get new sched monday

## 2013-12-05 NOTE — ED Notes (Signed)
Patient had large BM. Formed, soft brown stool noted.

## 2013-12-05 NOTE — ED Notes (Signed)
Port de-accessed after heparin flush. Bleeding controlled.

## 2013-12-05 NOTE — ED Notes (Signed)
Pt reports is on chemo for lung cancer, last treatment was Tuesday.

## 2013-12-05 NOTE — Discharge Instructions (Signed)
Constipation Constipation is when a person:  Poops (has a bowel movement) less than 3 times a week.  Has a hard time pooping.  Has poop that is dry, hard, or bigger than normal. HOME CARE   Eat foods with a lot of fiber in them. This includes fruits, vegetables, beans, and whole grains such as brown rice.  Avoid fatty foods and foods with a lot of sugar. This includes french fries, hamburgers, cookies, candy, and soda.  If you are not getting enough fiber from food, take products with added fiber in them (supplements).  Drink enough fluid to keep your pee (urine) clear or pale yellow.  Exercise on a regular basis, or as told by your doctor.  Go to the restroom when you feel like you need to poop. Do not hold it.  Only take medicine as told by your doctor. Do not take medicines that help you poop (laxatives) without talking to your doctor first. GET HELP RIGHT AWAY IF:   You have bright red blood in your poop (stool).  Your constipation lasts more than 4 days or gets worse.  You have belly (abdominal) or butt (rectal) pain.  You have thin poop (as thin as a pencil).  You lose weight, and it cannot be explained. MAKE SURE YOU:   Understand these instructions.  Will watch your condition.  Will get help right away if you are not doing well or get worse. Document Released: 08/31/2007 Document Revised: 03/19/2013 Document Reviewed: 12/24/2012 [facility] Patient Information 2015 [place], [place]. This information is not intended to replace advice given to you by your health care provider. Make sure you discuss any questions you have with your health care provider.   Increase fluids, fruits, fibers. Recommend fleets enema, magnesium citrate, Dulcolax.  We are also attempting to get a home nurse visit for your constipation.  Registered nurse will give you further information.

## 2013-12-05 NOTE — ED Notes (Signed)
Implanted port accessed using sterile technique.

## 2013-12-05 NOTE — ED Notes (Signed)
Patient with no complaints at this time. Respirations even and unlabored. Skin warm/dry. Discharge instructions reviewed with patient at this time. Patient given opportunity to voice concerns/ask questions. Patient discharged at this time and left Emergency Department via wheelchair.

## 2013-12-05 NOTE — ED Notes (Signed)
Spoke with [place] who will pass on to [place] about Pt needing assistance.

## 2013-12-05 NOTE — ED Notes (Signed)
Tech reports small amount of semi-loose stool. MD aware.

## 2013-12-05 NOTE — ED Provider Notes (Signed)
CSN: [id number]     Arrival date & time 12/05/13  1033 History  This chart was scribed for Nat Christen, MD by Ludger Nutting, ED Scribe. This patient was seen in room APA18/APA18 and the patient's care was started 10:50 AM.    Chief Complaint  Patient presents with  . Constipation   The history is provided by the patient. No language interpreter was used.    HPI Comments: Terry Robles is a 73 y.o. male who presents to the Emergency Department complaining of constipation for the past 4 days with associated abdominal pain, lower back pain, and rectal pain. He has tried a Radiation protection practitioner enema with minimal relief. He states his last normal BM was 4 days ago. He denies similar symptoms in the past.   Past Medical History  Diagnosis Date  . Hypercholesteremia 05/14/2011  . BPH (benign prostatic hyperplasia) 05/14/2011    takes Uroxatral daily  . GERD (gastroesophageal reflux disease)   . 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 Cancer     squamous cell carcinoma right superior sulcus  . Lung cancer     right lung  . Shortness of breath     weather related  . Pneumonia 1965  . Headache(784.0)     occasionally  . Joint pain   . Maintenance chemotherapy   . Urinary frequency   . History of shingles   . Anemia    Past Surgical History  Procedure Laterality Date  . Other surgical history      benign tumor removed from left leg several years ago  . Other surgical history      had melanoma/squamous cell removed previously per patient  . Tumor excision      Left knee  . Colonoscopy w/ biopsies and polypectomy    . Video bronchoscopy N/A 07/30/2013    Procedure: VIDEO BRONCHOSCOPY;  Surgeon: Grace Isaac, MD;  Location: [place];  Service: Thoracic;  Laterality: N/A;  . Mediastinoscopy N/A 07/30/2013    Procedure: MEDIASTINOSCOPY;  Surgeon: Grace Isaac, MD;  Location: [place];  Service: Thoracic;  Laterality: N/A;  . Video assisted thoracoscopy (vats)/wedge resection Right 07/30/2013   Procedure: VIDEO ASSISTED THORACOSCOPY;  Surgeon: Grace Isaac, MD;  Location: [facility];  Service: Thoracic;  Laterality: Right;  . Thoracotomy  07/30/2013    Procedure: THORACOTOMY FOR CHEST WALL RESECTION, RIGHT UPPER LOBECTOMY;  Surgeon: Grace Isaac, MD;  Location: [facility];  Service: Thoracic;;  . Adenoidectomy  as a child  . Tonsillectomy    . Portacath placement Left 10/30/2013    Procedure: INSERTION PORT-A-CATH WITH ULTRASOUND GUIDANCE AND FLUORO;  Surgeon: Grace Isaac, MD;  [facility];  Service: Thoracic;  Laterality: Left;   Family History  Problem Relation Age of Onset  . COPD Paternal Grandfather   . Breast cancer Paternal Aunt    History  Substance Use Topics  . Smoking status: Former Smoker -- 1.00 packs/day for 42 years    Types: Cigarettes  . Smokeless tobacco: Never Used     Comment: quit smoking almost 55yrs ago  . Alcohol Use: Yes     Comment: occasionally beer    Review of Systems  A complete 10 system review of systems was obtained and all systems are negative except as noted in the HPI and PMH.    Allergies  Review of patient's allergies indicates no known allergies.  Home Medications   Prior to Admission medications  Medication Sig Start Date End Date Taking? Authorizing Provider  alfuzosin (UROXATRAL) 10 MG 24 hr tablet Take 10 mg by mouth daily with breakfast.   Yes Historical Provider, MD  cycloSPORINE (RESTASIS) 0.05 % ophthalmic emulsion 1 drop 2 (two) times daily.   Yes Historical Provider, MD  HYDROcodone-homatropine (HYCODAN) 5-1.5 MG/5ML syrup Take 5 mLs by mouth every 6 (six) hours as needed for cough. 11/27/13  Yes Carlton Adam, PA-C  Multiple Vitamin (MULITIVITAMIN WITH MINERALS) TABS Take 1 tablet by mouth daily.   Yes Historical Provider, MD  oxyCODONE-acetaminophen (PERCOCET/ROXICET) 5-325 MG per tablet Take 1 tablet by mouth every 6 (six) hours as needed for severe pain. 11/27/13  Yes Adrena E Johnson, PA-C  polyethylene  glycol (MIRALAX / GLYCOLAX) packet Take 17 g by mouth daily.   Yes Historical Provider, MD  prochlorperazine (COMPAZINE) 5 MG tablet Take 5 mg by mouth every 6 (six) hours as needed for nausea or vomiting.   Yes Historical Provider, MD  sodium phosphate (FLEET) enema Place 1 enema rectally once. follow package directions   Yes Historical Provider, MD   BP 103/62  Pulse 91  Temp(Src) 98.1 F (36.7 C) (Oral)  Resp 18  Ht 6\' 1"  (1.854 m)  Wt 190 lb (86.183 kg)  BMI 25.07 kg/m2  SpO2 100% Physical Exam  Nursing note and vitals reviewed. Constitutional: He is oriented to person, place, and time. He appears well-developed and well-nourished.  HENT:  Head: Normocephalic and atraumatic.  Eyes: Conjunctivae and EOM are normal. Pupils are equal, round, and reactive to light.  Neck: Normal range of motion. Neck supple.  Cardiovascular: Normal rate, regular rhythm and normal heart sounds.   Pulmonary/Chest: Effort normal and breath sounds normal.  Abdominal: Soft. Bowel sounds are normal. There is tenderness (Minimal lower abdominal tenderness. ).  Musculoskeletal: Normal range of motion.  Neurological: He is alert and oriented to person, place, and time.  Skin: Skin is warm and dry.  Psychiatric: He has a normal mood and affect. His behavior is normal.    ED Course  Fecal disimpaction Date/Time: 12/05/2013 3:45 PM Performed by: Nat Christen Authorized by: Nat Christen Comments: 1400:  Digital fecal disimpaction performed by examiner. Unable to retrieve stool. Fleets enema administered. Minimal results   (including critical care time)  DIAGNOSTIC STUDIES: Oxygen Saturation is 99% on RA, normal by my interpretation.    COORDINATION OF CARE: 10:55 AM Will order abdominal XRAY. Discussed treatment plan with pt at bedside and pt agreed to plan.   Labs Review Labs Reviewed - No data to display  Imaging Review Dg Abd Acute W/chest  12/05/2013   CLINICAL DATA:  Lower abdominal pain, prior  VATS  EXAM: ACUTE ABDOMEN SERIES (ABDOMEN 2 VIEW & CHEST 1 VIEW)  COMPARISON:  None.  FINDINGS: There is no evidence of dilated bowel loops or free intraperitoneal air. No radiopaque calculi or other significant radiographic abnormality is seen. Heart size and mediastinal contours are within normal limits. Postsurgical changes and volume loss of the right upper lobe. No new focal parenchymal opacity, pleural effusion or pneumothorax. Left-sided Port-A-Cath in satisfactory position.  IMPRESSION: Negative abdominal radiographs.  No acute cardiopulmonary disease.   Electronically Signed   By: Kathreen Devoid   On: 12/05/2013 11:55     EKG Interpretation None      MDM   Final diagnoses:  Constipation, unspecified constipation type    I attempted to do a fecal disimpaction manually and with medication with minimal results.  Will obtain home health nurse consult for soapsuds enema.  No acute abdomen.  I personally performed the services described in this documentation, which was scribed in my presence. The recorded information has been reviewed and is accurate.   Nat Christen, MD 12/05/13 [id number]

## 2013-12-05 NOTE — ED Notes (Signed)
Dr Lacinda Axon attempted disimpaction. Dr Lacinda Axon administered saline fleets enema. Patient assisted to bedside commode and informed to hold enema as long as possible.

## 2013-12-05 NOTE — ED Notes (Signed)
Pt reports last bm was Monday.  Pt says feels like is impacted.  C/O lower abd and rectal pain, also lower back pain.

## 2013-12-05 NOTE — ED Notes (Signed)
Patient on BSC. Will discharge when done. Informed patient/family that home health will call to set up services.

## 2013-12-05 NOTE — Telephone Encounter (Signed)
Pt wife called, patient having severe rectum pain, vomited yesterday. Patient states no BM since Sunday or Monday. States he tried [employer] yesterday afternoon and Miralax yesterday morning, and a fleet enema this morning, states it would not go in. Pt wife states she gave the pt some gloves to try to unimpact it himself and he got dizzy. Informed Awilda Metro, PA. Per Burnetta Sabin pt can either go straight to ED or can try Magnesium Citrate first, drinking half a bottle and waiting 2-3 hrs and if still no BM drinking other half and waiting about 2-3 hrs and if patient still has no BM then he can present to ED. Informed patients wife to call us with anymore issues. Verbalized understanding with readback on instructions. Denies any questions or concerns at this time.

## 2013-12-09 ENCOUNTER — Ambulatory Visit: Payer: Medicare Other

## 2013-12-09 ENCOUNTER — Ambulatory Visit (HOSPITAL_BASED_OUTPATIENT_CLINIC_OR_DEPARTMENT_OTHER): Payer: Medicare Other

## 2013-12-09 ENCOUNTER — Other Ambulatory Visit (HOSPITAL_BASED_OUTPATIENT_CLINIC_OR_DEPARTMENT_OTHER): Payer: Medicare Other

## 2013-12-09 VITALS — BP 121/63 | HR 89 | Temp 98.3°F | Resp 18

## 2013-12-09 DIAGNOSIS — Z5111 Encounter for antineoplastic chemotherapy: Secondary | ICD-10-CM

## 2013-12-09 DIAGNOSIS — C3411 Malignant neoplasm of upper lobe, right bronchus or lung: Secondary | ICD-10-CM

## 2013-12-09 DIAGNOSIS — C3492 Malignant neoplasm of unspecified part of left bronchus or lung: Secondary | ICD-10-CM

## 2013-12-09 DIAGNOSIS — Z95828 Presence of other vascular implants and grafts: Secondary | ICD-10-CM

## 2013-12-09 DIAGNOSIS — C3410 Malignant neoplasm of upper lobe, unspecified bronchus or lung: Secondary | ICD-10-CM

## 2013-12-09 LAB — CBC WITH DIFFERENTIAL/PLATELET
BASO%: 0.7 % (ref 0.0–2.0)
BASOS ABS: 0 10*3/uL (ref 0.0–0.1)
EOS ABS: 0 10*3/uL (ref 0.0–0.5)
EOS%: 0.8 % (ref 0.0–7.0)
HEMATOCRIT: 28 % — AB (ref 38.4–49.9)
HGB: 9.4 g/dL — ABNORMAL LOW (ref 13.0–17.1)
LYMPH%: 22.1 % (ref 14.0–49.0)
MCH: 33 pg (ref 27.2–33.4)
MCHC: 33.6 g/dL (ref 32.0–36.0)
MCV: 98.1 fL — ABNORMAL HIGH (ref 79.3–98.0)
MONO#: 0.2 10*3/uL (ref 0.1–0.9)
MONO%: 6.8 % (ref 0.0–14.0)
NEUT%: 69.6 % (ref 39.0–75.0)
NEUTROS ABS: 2.2 10*3/uL (ref 1.5–6.5)
PLATELETS: 123 10*3/uL — AB (ref 140–400)
RBC: 2.85 10*6/uL — ABNORMAL LOW (ref 4.20–5.82)
RDW: 17.1 % — ABNORMAL HIGH (ref 11.0–14.6)
WBC: 3.2 10*3/uL — AB (ref 4.0–10.3)
lymph#: 0.7 10*3/uL — ABNORMAL LOW (ref 0.9–3.3)

## 2013-12-09 LAB — COMPREHENSIVE METABOLIC PANEL (CC13)
ALT: 14 U/L (ref 0–55)
ANION GAP: 8 meq/L (ref 3–11)
AST: 18 U/L (ref 5–34)
Albumin: 3 g/dL — ABNORMAL LOW (ref 3.5–5.0)
Alkaline Phosphatase: 108 U/L (ref 40–150)
BUN: 13.9 mg/dL (ref 7.0–26.0)
CO2: 24 meq/L (ref 22–29)
CREATININE: 0.8 mg/dL (ref 0.7–1.3)
Calcium: 9.4 mg/dL (ref 8.4–10.4)
Chloride: 107 mEq/L (ref 98–109)
GLUCOSE: 127 mg/dL (ref 70–140)
Potassium: 3.9 mEq/L (ref 3.5–5.1)
Sodium: 139 mEq/L (ref 136–145)
Total Bilirubin: 0.44 mg/dL (ref 0.20–1.20)
Total Protein: 6.5 g/dL (ref 6.4–8.3)

## 2013-12-09 MED ORDER — HEPARIN SOD (PORK) LOCK FLUSH 100 UNIT/ML IV SOLN
500.0000 [IU] | Freq: Once | INTRAVENOUS | Status: AC | PRN
  Administered 2013-12-09: 500 [IU]
  Filled 2013-12-09: qty 5

## 2013-12-09 MED ORDER — DEXAMETHASONE SODIUM PHOSPHATE 10 MG/ML IJ SOLN
10.0000 mg | Freq: Once | INTRAMUSCULAR | Status: AC
  Administered 2013-12-09: 10 mg via INTRAVENOUS

## 2013-12-09 MED ORDER — PACLITAXEL PROTEIN-BOUND CHEMO INJECTION 100 MG
100.0000 mg/m2 | Freq: Once | INTRAVENOUS | Status: AC
Start: 2013-12-09 — End: 2013-12-09
  Administered 2013-12-09: 225 mg via INTRAVENOUS
  Filled 2013-12-09: qty 45

## 2013-12-09 MED ORDER — DEXAMETHASONE SODIUM PHOSPHATE 10 MG/ML IJ SOLN
INTRAMUSCULAR | Status: AC
  Filled 2013-12-09: qty 1

## 2013-12-09 MED ORDER — ONDANSETRON 8 MG/NS 50 ML IVPB
INTRAVENOUS | Status: AC
  Filled 2013-12-09: qty 8

## 2013-12-09 MED ORDER — SODIUM CHLORIDE 0.9 % IJ SOLN
10.0000 mL | INTRAMUSCULAR | Status: DC | PRN
  Administered 2013-12-09: 10 mL via INTRAVENOUS
  Filled 2013-12-09: qty 10

## 2013-12-09 MED ORDER — SODIUM CHLORIDE 0.9 % IV SOLN
Freq: Once | INTRAVENOUS | Status: AC
  Administered 2013-12-09: 13:00:00 via INTRAVENOUS

## 2013-12-09 MED ORDER — ONDANSETRON 8 MG/50ML IVPB (CHCC)
8.0000 mg | Freq: Once | INTRAVENOUS | Status: AC
  Administered 2013-12-09: 8 mg via INTRAVENOUS

## 2013-12-09 MED ORDER — SODIUM CHLORIDE 0.9 % IJ SOLN
10.0000 mL | INTRAMUSCULAR | Status: DC | PRN
  Administered 2013-12-09: 10 mL
  Filled 2013-12-09: qty 10

## 2013-12-09 NOTE — Patient Instructions (Signed)
[facility] Discharge Instructions for Patients Receiving Chemotherapy  Today you received the following chemotherapy agents abraxane.    To help prevent nausea and vomiting after your treatment, we encourage you to take your nausea medication as directed.     If you develop nausea and vomiting that is not controlled by your nausea medication, call the clinic.   BELOW ARE SYMPTOMS THAT SHOULD BE REPORTED IMMEDIATELY:  *FEVER GREATER THAN 100.5 F  *CHILLS WITH OR WITHOUT FEVER  NAUSEA AND VOMITING THAT IS NOT CONTROLLED WITH YOUR NAUSEA MEDICATION  *UNUSUAL SHORTNESS OF BREATH  *UNUSUAL BRUISING OR BLEEDING  TENDERNESS IN MOUTH AND THROAT WITH OR WITHOUT PRESENCE OF ULCERS  *URINARY PROBLEMS  *BOWEL PROBLEMS  UNUSUAL RASH Items with * indicate a potential emergency and should be followed up as soon as possible.  Feel free to call the clinic you have any questions or concerns. The clinic phone number is [phone].

## 2013-12-09 NOTE — Patient Instructions (Signed)

## 2013-12-10 ENCOUNTER — Encounter: Payer: Self-pay | Admitting: Internal Medicine

## 2013-12-11 ENCOUNTER — Encounter: Payer: Self-pay | Admitting: Vascular Surgery

## 2013-12-12 ENCOUNTER — Ambulatory Visit (HOSPITAL_COMMUNITY)
Admission: RE | Admit: 2013-12-12 | Discharge: 2013-12-12 | Disposition: A | Discharge status: Home / Self Care | Payer: Medicare Other | Source: Ambulatory Visit | Attending: Vascular Surgery | Admitting: Vascular Surgery

## 2013-12-12 ENCOUNTER — Ambulatory Visit (INDEPENDENT_AMBULATORY_CARE_PROVIDER_SITE_OTHER): Payer: Medicare Other | Admitting: Vascular Surgery

## 2013-12-12 ENCOUNTER — Ambulatory Visit (INDEPENDENT_AMBULATORY_CARE_PROVIDER_SITE_OTHER)
Admission: RE | Admit: 2013-12-12 | Discharge: 2013-12-12 | Disposition: A | Discharge status: Home / Self Care | Payer: Medicare Other | Source: Ambulatory Visit | Attending: Vascular Surgery | Admitting: Vascular Surgery

## 2013-12-12 ENCOUNTER — Other Ambulatory Visit: Payer: Self-pay | Admitting: Vascular Surgery

## 2013-12-12 ENCOUNTER — Encounter: Payer: Self-pay | Admitting: Vascular Surgery

## 2013-12-12 VITALS — BP 108/67 | HR 96 | Ht 73.0 in | Wt 185.5 lb

## 2013-12-12 DIAGNOSIS — I771 Stricture of artery: Secondary | ICD-10-CM

## 2013-12-12 DIAGNOSIS — I82612 Acute embolism and thrombosis of superficial veins of left upper extremity: Secondary | ICD-10-CM

## 2013-12-12 DIAGNOSIS — M79602 Pain in left arm: Secondary | ICD-10-CM | POA: Insufficient documentation

## 2013-12-12 DIAGNOSIS — M79609 Pain in unspecified limb: Secondary | ICD-10-CM

## 2013-12-12 DIAGNOSIS — R209 Unspecified disturbances of skin sensation: Secondary | ICD-10-CM

## 2013-12-12 DIAGNOSIS — M79632 Pain in left forearm: Secondary | ICD-10-CM

## 2013-12-12 DIAGNOSIS — R0602 Shortness of breath: Secondary | ICD-10-CM | POA: Insufficient documentation

## 2013-12-12 DIAGNOSIS — M79601 Pain in right arm: Secondary | ICD-10-CM

## 2013-12-12 DIAGNOSIS — I82619 Acute embolism and thrombosis of superficial veins of unspecified upper extremity: Secondary | ICD-10-CM | POA: Insufficient documentation

## 2013-12-12 NOTE — Progress Notes (Signed)
Referred by:  Iona Beard, MD [address] [place], [place] [address]  Reason for referral: left arm coolner  History of Present Illness  Terry Robles is a 73 y.o. (1940/07/30) male stage IIIa lung cancer who presents with chief complaint: left arm coolness.  Since starting chemotherapy, patient notes a differential in temperature between his left arm and right arm, with the left arm being cooler.  Notes intermittent vague pain in the arm without any obvious trigger.  He denies any anesthesia or paraesthesia in the left arm.  He denies any Raynaud pattern of skin changes with his sx.  He denies any difficulties with ADL.  He does not fatigue with his chemotherapy.  Atherosclerotic risks included: HLD, active smoking  Past Medical History  Diagnosis Date  . Hypercholesteremia 05/14/2011  . BPH (benign prostatic hyperplasia) 05/14/2011    takes Uroxatral daily  . GERD (gastroesophageal reflux disease)   . 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 Cancer     squamous cell carcinoma right superior sulcus  . Lung cancer     right lung  . Shortness of breath     weather related  . Pneumonia 1965  . Headache(784.0)     occasionally  . Joint pain   . Maintenance chemotherapy   . Urinary frequency   . History of shingles   . Anemia     Past Surgical History  Procedure Laterality Date  . Other surgical history      benign tumor removed from left leg several years ago  . Other surgical history      had melanoma/squamous cell removed previously per patient  . Tumor excision      Left knee  . Colonoscopy w/ biopsies and polypectomy    . Video bronchoscopy N/A 07/30/2013    Procedure: VIDEO BRONCHOSCOPY;  Surgeon: Grace Isaac, MD;  Location: [facility];  Service: Thoracic;  Laterality: N/A;  . Mediastinoscopy N/A 07/30/2013    Procedure: MEDIASTINOSCOPY;  Surgeon: Grace Isaac, MD;  Location: [facility];  Service: Thoracic;  Laterality: N/A;  . Video assisted  thoracoscopy (vats)/wedge resection Right 07/30/2013    Procedure: VIDEO ASSISTED THORACOSCOPY;  Surgeon: Grace Isaac, MD;  Location: [facility];  Service: Thoracic;  Laterality: Right;  . Thoracotomy  07/30/2013    Procedure: THORACOTOMY FOR CHEST WALL RESECTION, RIGHT UPPER LOBECTOMY;  Surgeon: Grace Isaac, MD;  Location: [facility];  Service: Thoracic;;  . Adenoidectomy  as a child  . Tonsillectomy    . Portacath placement Left 10/30/2013    Procedure: INSERTION PORT-A-CATH WITH ULTRASOUND GUIDANCE AND FLUORO;  Surgeon: Grace Isaac, MD;  Location: [facility];  Service: Thoracic;  Laterality: Left;    History   Social History  . Marital Status: Married    Spouse Name: N/A    Number of Children: N/A  . Years of Education: N/A   Occupational History  . Not on file.   Social History Main Topics  . Smoking status: Former Smoker -- 1.00 packs/day for 42 years    Types: Cigarettes  . Smokeless tobacco: Never Used     Comment: quit smoking almost 86yrs ago  . Alcohol Use: Yes     Comment: occasionally beer  . Drug Use: No  . Sexual Activity: Not on file   Other Topics Concern  . Not on file   Social History Narrative  . No narrative on file  Family History  Problem Relation Age of Onset  . COPD Paternal Grandfather   . Breast cancer Paternal Aunt     Current Outpatient Prescriptions  Medication Sig Dispense Refill  . alfuzosin (UROXATRAL) 10 MG 24 hr tablet Take 10 mg by mouth daily with breakfast.      . cycloSPORINE (RESTASIS) 0.05 % ophthalmic emulsion 1 drop 2 (two) times daily.      Marland Kitchen HYDROcodone-homatropine (HYCODAN) 5-1.5 MG/5ML syrup Take 5 mLs by mouth every 6 (six) hours as needed for cough.  240 mL  0  . Multiple Vitamin (MULITIVITAMIN WITH MINERALS) TABS Take 1 tablet by mouth daily.      Marland Kitchen oxyCODONE-acetaminophen (PERCOCET/ROXICET) 5-325 MG per tablet Take 1 tablet by mouth every 6 (six) hours as needed for severe pain.  60 tablet  0  . polyethylene glycol  (MIRALAX / GLYCOLAX) packet Take 17 g by mouth daily.      . prochlorperazine (COMPAZINE) 5 MG tablet Take 5 mg by mouth every 6 (six) hours as needed for nausea or vomiting.      . sodium phosphate (FLEET) enema Place 1 enema rectally once. follow package directions       No current facility-administered medications for this visit.     No Known Allergies  REVIEW OF SYSTEMS:  (Positives checked otherwise negative)  CARDIOVASCULAR:  []  chest pain, []  chest pressure, []  palpitations, []  shortness of breath when laying flat, [x]  shortness of breath with exertion,  []  pain in feet when walking, []  pain in feet when laying flat, []  history of blood clot in veins (DVT), []  history of phlebitis, []  swelling in legs, []  varicose veins  PULMONARY:  []  productive cough, []  asthma, []  wheezing  NEUROLOGIC:  []  weakness in arms or legs, []  numbness in arms or legs, []  difficulty speaking or slurred speech, []  temporary loss of vision in one eye, [x]  dizziness  HEMATOLOGIC:  []  bleeding problems, []  problems with blood clotting too easily  MUSCULOSKEL:  []  joint pain, []  joint swelling  GASTROINTEST:  []  vomiting blood, []  blood in stool     GENITOURINARY:  []  burning with urination, []  blood in urine  PSYCHIATRIC:  []  history of major depression  INTEGUMENTARY:  []  rashes, []  ulcers  CONSTITUTIONAL:  []  fever, []  chills  Physical Examination  Filed Vitals:   12/12/13 1414  BP: 108/67  Pulse: 96  Height: 6\' 1"  (1.854 m)  Weight: 185 lb 8 oz (84.142 kg)  SpO2: 100%    Body mass index is 24.48 kg/(m^2).  General: A&O x 3, WD, thin, bald  Head: [place]/AT  Ear/Nose/Throat: Hearing grossly intact, nares w/o erythema or drainage, oropharynx w/o Erythema/Exudate, Mallampati score: 3  Eyes: PERRLA, EOMI  Neck: Supple, no nuchal rigidity, no palpable LAD  Pulmonary: Sym exp, good air movt, CTAB, no rales, rhonchi, & wheezing; L medial mediport  Cardiac: RRR, Nl S1, S2, no Murmurs, rubs  or gallops Vascular: Vessel Right Left  Radial Palpable Palpable  Ulnar Palpable Palpable  Brachial Palpable Palpable  Carotid Palpable, without bruit Palpable, without bruit  Aorta Not palpable N/A  Femoral Palpable Palpable  Popliteal Not palpable Not palpable  PT  Palpable  Palpable  AT Faintly Palpable  Faintly Palpable   Gastrointestinal: soft, NTND, -G/R, - HSM, - masses, - CVAT B  Musculoskeletal: M/S 5/5 throughout , Extremities without ischemic changes , no cyanosis in left hand  Neurologic: CN 2-12 intact , Pain and light touch intact in extremities ,  Motor exam as listed above  Psychiatric: Judgment intact, Mood & affect appropriate for pt's clinical situation  Dermatologic: See M/S exam for extremity exam, no rashes otherwise noted  Lymph : No Cervical, Axillary, or Inguinal lymphadenopathy   Non-Invasive Vascular Imaging  LUE arterial duplex (12/12/2013)  Triphasic throughout  LUE venous duplex (12/12/2013)  No DVT  + SVT: in cephalic  Outside Studies/Documentation 4 pages of outside documents were reviewed including: outpatient Oncology chart.  Medical Decision Making  Terry Robles is a 73 y.o. male who presents with: left arm coolness, no evidence of arterial insufficiency in left arm, SVT   I discussed in depth with the patient the nature of atherosclerosis, and emphasized the importance of maximal medical management including strict control of blood pressure, blood glucose, and lipid levels, antiplatelet agents, obtaining regular exercise, and cessation of smoking.    Thank you for allowing [place] to participate in this patient's care.  Adele Barthel, MD [facility] Office: [phone] Pager: [phone]  12/12/2013, 2:33 PM

## 2013-12-13 ENCOUNTER — Other Ambulatory Visit: Payer: Self-pay | Admitting: Medical Oncology

## 2013-12-13 ENCOUNTER — Encounter: Payer: Self-pay | Admitting: Internal Medicine

## 2013-12-13 ENCOUNTER — Other Ambulatory Visit: Payer: Self-pay | Admitting: *Deleted

## 2013-12-16 ENCOUNTER — Telehealth: Payer: Self-pay | Admitting: *Deleted

## 2013-12-16 ENCOUNTER — Ambulatory Visit: Payer: Medicare Other | Admitting: Physician Assistant

## 2013-12-16 ENCOUNTER — Other Ambulatory Visit (HOSPITAL_BASED_OUTPATIENT_CLINIC_OR_DEPARTMENT_OTHER): Payer: Medicare Other

## 2013-12-16 ENCOUNTER — Other Ambulatory Visit: Payer: Medicare Other

## 2013-12-16 ENCOUNTER — Ambulatory Visit: Payer: Medicare Other

## 2013-12-16 ENCOUNTER — Ambulatory Visit (HOSPITAL_BASED_OUTPATIENT_CLINIC_OR_DEPARTMENT_OTHER): Payer: Medicare Other

## 2013-12-16 DIAGNOSIS — C3492 Malignant neoplasm of unspecified part of left bronchus or lung: Secondary | ICD-10-CM

## 2013-12-16 DIAGNOSIS — Z452 Encounter for adjustment and management of vascular access device: Secondary | ICD-10-CM

## 2013-12-16 DIAGNOSIS — C3410 Malignant neoplasm of upper lobe, unspecified bronchus or lung: Secondary | ICD-10-CM

## 2013-12-16 DIAGNOSIS — Z95828 Presence of other vascular implants and grafts: Secondary | ICD-10-CM

## 2013-12-16 LAB — COMPREHENSIVE METABOLIC PANEL (CC13)
ALT: 19 U/L (ref 0–55)
ANION GAP: 11 meq/L (ref 3–11)
AST: 19 U/L (ref 5–34)
Albumin: 3.1 g/dL — ABNORMAL LOW (ref 3.5–5.0)
Alkaline Phosphatase: 126 U/L (ref 40–150)
BUN: 17.4 mg/dL (ref 7.0–26.0)
CHLORIDE: 105 meq/L (ref 98–109)
CO2: 23 meq/L (ref 22–29)
CREATININE: 0.8 mg/dL (ref 0.7–1.3)
Calcium: 9.5 mg/dL (ref 8.4–10.4)
Glucose: 137 mg/dl (ref 70–140)
Potassium: 3.8 mEq/L (ref 3.5–5.1)
Sodium: 138 mEq/L (ref 136–145)
TOTAL PROTEIN: 6.7 g/dL (ref 6.4–8.3)
Total Bilirubin: 0.49 mg/dL (ref 0.20–1.20)

## 2013-12-16 LAB — CBC WITH DIFFERENTIAL/PLATELET
BASO%: 0.5 % (ref 0.0–2.0)
BASOS ABS: 0 10*3/uL (ref 0.0–0.1)
EOS%: 0.4 % (ref 0.0–7.0)
Eosinophils Absolute: 0 10*3/uL (ref 0.0–0.5)
HEMATOCRIT: 27.2 % — AB (ref 38.4–49.9)
HEMOGLOBIN: 9.2 g/dL — AB (ref 13.0–17.1)
LYMPH#: 0.6 10*3/uL — AB (ref 0.9–3.3)
LYMPH%: 21.9 % (ref 14.0–49.0)
MCH: 33.1 pg (ref 27.2–33.4)
MCHC: 33.7 g/dL (ref 32.0–36.0)
MCV: 98.3 fL — ABNORMAL HIGH (ref 79.3–98.0)
MONO#: 0.3 10*3/uL (ref 0.1–0.9)
MONO%: 10.9 % (ref 0.0–14.0)
NEUT#: 1.8 10*3/uL (ref 1.5–6.5)
NEUT%: 66.3 % (ref 39.0–75.0)
Platelets: 80 10*3/uL — ABNORMAL LOW (ref 140–400)
RBC: 2.77 10*6/uL — ABNORMAL LOW (ref 4.20–5.82)
RDW: 16.8 % — AB (ref 11.0–14.6)
WBC: 2.8 10*3/uL — AB (ref 4.0–10.3)

## 2013-12-16 MED ORDER — SODIUM CHLORIDE 0.9 % IJ SOLN
10.0000 mL | INTRAMUSCULAR | Status: DC | PRN
  Administered 2013-12-16: 10 mL via INTRAVENOUS
  Filled 2013-12-16: qty 10

## 2013-12-16 NOTE — Patient Instructions (Signed)

## 2013-12-16 NOTE — Progress Notes (Unsigned)
Pt c/o fatigue that has been gradually getting worse over the last month.  Sitting BP 83/35.  Standing BP 50/28.  Pt states he gets "lightheaded" when he stands up.  C/O dry cough and SOB.  O2 Sat on room air is 98%. States he was in ED 1 week ago with "blocked colon" but is taking Miralax daily and this is not an issue now. Dr. Mckinley Jewel notified and IVF's started for a total of 1 liter.

## 2013-12-16 NOTE — Telephone Encounter (Signed)
Per desk RN I have moved appts. Desk RN to call the patient  and enter POF. JMW

## 2013-12-18 ENCOUNTER — Emergency Department (HOSPITAL_COMMUNITY): Payer: Medicare Other

## 2013-12-18 ENCOUNTER — Telehealth: Payer: Self-pay | Admitting: *Deleted

## 2013-12-18 ENCOUNTER — Inpatient Hospital Stay (HOSPITAL_COMMUNITY): Payer: Medicare Other

## 2013-12-18 ENCOUNTER — Encounter (HOSPITAL_COMMUNITY): Payer: Self-pay | Admitting: Emergency Medicine

## 2013-12-18 ENCOUNTER — Inpatient Hospital Stay (HOSPITAL_COMMUNITY)
Admission: EM | Admit: 2013-12-18 | Discharge: 2013-12-23 | DRG: 199 | Disposition: A | Discharge status: Home / Self Care | Payer: Medicare Other | Attending: Cardiothoracic Surgery | Admitting: Cardiothoracic Surgery

## 2013-12-18 DIAGNOSIS — D6959 Other secondary thrombocytopenia: Secondary | ICD-10-CM | POA: Diagnosis not present

## 2013-12-18 DIAGNOSIS — N400 Enlarged prostate without lower urinary tract symptoms: Secondary | ICD-10-CM | POA: Diagnosis not present

## 2013-12-18 DIAGNOSIS — D6481 Anemia due to antineoplastic chemotherapy: Secondary | ICD-10-CM | POA: Diagnosis not present

## 2013-12-18 DIAGNOSIS — Z85828 Personal history of other malignant neoplasm of skin: Secondary | ICD-10-CM | POA: Diagnosis not present

## 2013-12-18 DIAGNOSIS — E43 Unspecified severe protein-calorie malnutrition: Secondary | ICD-10-CM | POA: Diagnosis present

## 2013-12-18 DIAGNOSIS — Z79899 Other long term (current) drug therapy: Secondary | ICD-10-CM

## 2013-12-18 DIAGNOSIS — D61818 Other pancytopenia: Secondary | ICD-10-CM | POA: Diagnosis not present

## 2013-12-18 DIAGNOSIS — C3490 Malignant neoplasm of unspecified part of unspecified bronchus or lung: Secondary | ICD-10-CM | POA: Diagnosis not present

## 2013-12-18 DIAGNOSIS — I951 Orthostatic hypotension: Secondary | ICD-10-CM | POA: Diagnosis present

## 2013-12-18 DIAGNOSIS — J939 Pneumothorax, unspecified: Secondary | ICD-10-CM | POA: Diagnosis present

## 2013-12-18 DIAGNOSIS — IMO0002 Reserved for concepts with insufficient information to code with codable children: Secondary | ICD-10-CM | POA: Diagnosis not present

## 2013-12-18 DIAGNOSIS — J9383 Other pneumothorax: Principal | ICD-10-CM | POA: Diagnosis present

## 2013-12-18 DIAGNOSIS — K5900 Constipation, unspecified: Secondary | ICD-10-CM | POA: Diagnosis not present

## 2013-12-18 DIAGNOSIS — Z87891 Personal history of nicotine dependence: Secondary | ICD-10-CM | POA: Diagnosis not present

## 2013-12-18 DIAGNOSIS — K219 Gastro-esophageal reflux disease without esophagitis: Secondary | ICD-10-CM | POA: Diagnosis present

## 2013-12-18 DIAGNOSIS — T451X5A Adverse effect of antineoplastic and immunosuppressive drugs, initial encounter: Secondary | ICD-10-CM | POA: Diagnosis present

## 2013-12-18 DIAGNOSIS — Z85118 Personal history of other malignant neoplasm of bronchus and lung: Secondary | ICD-10-CM

## 2013-12-18 HISTORY — DX: Thrombocytopenia, unspecified: D69.6

## 2013-12-18 LAB — COMPREHENSIVE METABOLIC PANEL
ALT: 16 U/L (ref 0–53)
AST: 17 U/L (ref 0–37)
Albumin: 3.1 g/dL — ABNORMAL LOW (ref 3.5–5.2)
Alkaline Phosphatase: 126 U/L — ABNORMAL HIGH (ref 39–117)
Anion gap: 13 (ref 5–15)
BILIRUBIN TOTAL: 0.4 mg/dL (ref 0.3–1.2)
BUN: 15 mg/dL (ref 6–23)
CHLORIDE: 100 meq/L (ref 96–112)
CO2: 26 mEq/L (ref 19–32)
CREATININE: 0.88 mg/dL (ref 0.50–1.35)
Calcium: 9.4 mg/dL (ref 8.4–10.5)
GFR calc non Af Amer: 84 mL/min — ABNORMAL LOW (ref >90)
GLUCOSE: 111 mg/dL — AB (ref 70–99)
Potassium: 3.7 mEq/L (ref 3.7–5.3)
Sodium: 139 mEq/L (ref 137–147)
Total Protein: 6.9 g/dL (ref 6.0–8.3)

## 2013-12-18 LAB — CBC WITH DIFFERENTIAL/PLATELET
BASOS ABS: 0 10*3/uL (ref 0.0–0.1)
BASOS PCT: 0 % (ref 0–1)
EOS PCT: 0 % (ref 0–5)
Eosinophils Absolute: 0 10*3/uL (ref 0.0–0.7)
HEMATOCRIT: 24.8 % — AB (ref 39.0–52.0)
HEMOGLOBIN: 8.6 g/dL — AB (ref 13.0–17.0)
Lymphocytes Relative: 33 % (ref 12–46)
Lymphs Abs: 0.7 10*3/uL (ref 0.7–4.0)
MCH: 33.7 pg (ref 26.0–34.0)
MCHC: 34.7 g/dL (ref 30.0–36.0)
MCV: 97.3 fL (ref 78.0–100.0)
MONOS PCT: 16 % — AB (ref 3–12)
Monocytes Absolute: 0.3 10*3/uL (ref 0.1–1.0)
NEUTROS ABS: 1.1 10*3/uL — AB (ref 1.7–7.7)
Neutrophils Relative %: 51 % (ref 43–77)
Platelets: 64 10*3/uL — ABNORMAL LOW (ref 150–400)
RBC: 2.55 MIL/uL — ABNORMAL LOW (ref 4.22–5.81)
RDW: 16.8 % — AB (ref 11.5–15.5)
WBC: 2.1 10*3/uL — AB (ref 4.0–10.5)

## 2013-12-18 MED ORDER — PROCHLORPERAZINE MALEATE 5 MG PO TABS
5.0000 mg | ORAL_TABLET | Freq: Four times a day (QID) | ORAL | Status: DC | PRN
  Filled 2013-12-18: qty 1

## 2013-12-18 MED ORDER — GI COCKTAIL ~~LOC~~
30.0000 mL | Freq: Three times a day (TID) | ORAL | Status: DC | PRN
  Filled 2013-12-18: qty 30

## 2013-12-18 MED ORDER — OXYCODONE-ACETAMINOPHEN 5-325 MG PO TABS
1.0000 | ORAL_TABLET | Freq: Four times a day (QID) | ORAL | Status: DC | PRN
  Administered 2013-12-19 – 2013-12-22 (×4): 1 via ORAL
  Filled 2013-12-18 (×4): qty 1

## 2013-12-18 MED ORDER — MAGNESIUM HYDROXIDE 400 MG/5ML PO SUSP
30.0000 mL | Freq: Every day | ORAL | Status: DC | PRN
  Administered 2013-12-20: 30 mL via ORAL
  Filled 2013-12-18: qty 30

## 2013-12-18 MED ORDER — HYDROCODONE-HOMATROPINE 5-1.5 MG/5ML PO SYRP: 5.0000 mL | ORAL_SOLUTION | Freq: Four times a day (QID) | ORAL | Status: DC | PRN

## 2013-12-18 MED ORDER — POLYETHYLENE GLYCOL 3350 17 G PO PACK
17.0000 g | PACK | Freq: Every day | ORAL | Status: DC
  Administered 2013-12-20 – 2013-12-21 (×2): 17 g via ORAL
  Filled 2013-12-18 (×4): qty 1

## 2013-12-18 MED ORDER — MORPHINE SULFATE 2 MG/ML IJ SOLN: 2.0000 mg | INTRAMUSCULAR | Status: DC | PRN

## 2013-12-18 MED ORDER — SODIUM CHLORIDE 0.9 % IV BOLUS (SEPSIS)
1000.0000 mL | Freq: Once | INTRAVENOUS | Status: AC
  Administered 2013-12-18: 1000 mL via INTRAVENOUS

## 2013-12-18 MED ORDER — ADULT MULTIVITAMIN W/MINERALS CH
1.0000 | ORAL_TABLET | Freq: Every day | ORAL | Status: DC
  Administered 2013-12-20 – 2013-12-23 (×4): 1 via ORAL
  Filled 2013-12-18 (×5): qty 1

## 2013-12-18 MED ORDER — CYCLOSPORINE 0.05 % OP EMUL
1.0000 [drp] | Freq: Two times a day (BID) | OPHTHALMIC | Status: DC
  Administered 2013-12-19 – 2013-12-23 (×5): 1 [drp] via OPHTHALMIC
  Filled 2013-12-18 (×11): qty 1

## 2013-12-18 MED ORDER — ALFUZOSIN HCL ER 10 MG PO TB24
10.0000 mg | ORAL_TABLET | Freq: Every day | ORAL | Status: DC
  Administered 2013-12-19 – 2013-12-23 (×5): 10 mg via ORAL
  Filled 2013-12-18 (×7): qty 1

## 2013-12-18 NOTE — ED Notes (Signed)
Consent obtained for chest tube placement

## 2013-12-18 NOTE — ED Notes (Signed)
Pt had left chest port. No power port kits on unit. AC aware and reported would bring kit to unit. Pt aware of delay.

## 2013-12-18 NOTE — ED Notes (Addendum)
Pt report mild dizziness after changing position from lying to sitting. nad noted. EDP aware of v/s.no new orders given.

## 2013-12-18 NOTE — Telephone Encounter (Signed)
Received call [facility] with [facility].  She visited the home and his BP is still low.  Pt got IVF on 9/21 for hypotension.  BP was 100/60 while sitting and 60/40 when he stood up.  Called and spoke to pt, he is light headed when he stands.  He is not taking any BP beds, no vomiting or diarrhea noted.  Per Dr Vista Mink, pt should go to the ED to be evaluated.  Pt verbalized understanding and is going to go to [facility].

## 2013-12-18 NOTE — Procedures (Signed)
Chest Tube Insertion Procedure Note  Indications:  Clinically significant Pneumothorax, right:  Right lung cancer, s/p right upper lobectomy/rib excision 08/09/13.  Currently receiving chemotherapy in [place].  Pre-operative Diagnosis: Pneumothorax, right  Post-operative Diagnosis: Smae  Procedure Details  Informed consent was obtained for the procedure.  Risks of lung perforation, hemorrhage, arrhythmia, and adverse drug reaction were discussed.   After sterile skin prep, using standard technique, a 20 French tube was placed in the right anterolateral rib space.  1% xylocaine used for local.  Adequate position confirmed by CXR.  Findings: None  Estimated Blood Loss:  Minimal         Specimens:  None              Complications:  None; patient tolerated the procedure well.         Disposition: Transfer to [facility] for further management and treatment.         Condition: stable  Attending Attestation: I performed the procedure.

## 2013-12-18 NOTE — ED Notes (Signed)
Chest tube placement completed by Dr. Arnoldo Morale, assisted by this RN and Dr. [place] Jock. Pt placed on [place] drainage system and hooked to high continuous suction. Nad noted at present. Portable chest x-ray ordered to verify placement.

## 2013-12-18 NOTE — ED Notes (Signed)
Pt reports feeling lightheaded and dizzy when he stands. Pt reports he was unable to do chemo on Monday due to low SBP. SBP in the 70's this AM by [facility] nurse. Pt advised by oncologist to f/u in ED for further evaluation.

## 2013-12-19 ENCOUNTER — Inpatient Hospital Stay (HOSPITAL_COMMUNITY): Payer: Medicare Other

## 2013-12-19 DIAGNOSIS — J9311 Primary spontaneous pneumothorax: Secondary | ICD-10-CM

## 2013-12-19 DIAGNOSIS — E43 Unspecified severe protein-calorie malnutrition: Secondary | ICD-10-CM | POA: Diagnosis present

## 2013-12-19 LAB — CBC
HCT: 26.2 % — ABNORMAL LOW (ref 39.0–52.0)
Hemoglobin: 8.9 g/dL — ABNORMAL LOW (ref 13.0–17.0)
MCH: 33.8 pg (ref 26.0–34.0)
MCHC: 34 g/dL (ref 30.0–36.0)
MCV: 99.6 fL (ref 78.0–100.0)
PLATELETS: 55 10*3/uL — AB (ref 150–400)
RBC: 2.63 MIL/uL — AB (ref 4.22–5.81)
RDW: 17.3 % — ABNORMAL HIGH (ref 11.5–15.5)
WBC: 2.4 10*3/uL — AB (ref 4.0–10.5)

## 2013-12-19 LAB — BASIC METABOLIC PANEL
ANION GAP: 10 (ref 5–15)
BUN: 14 mg/dL (ref 6–23)
CHLORIDE: 104 meq/L (ref 96–112)
CO2: 26 meq/L (ref 19–32)
CREATININE: 0.87 mg/dL (ref 0.50–1.35)
Calcium: 9 mg/dL (ref 8.4–10.5)
GFR calc non Af Amer: 84 mL/min — ABNORMAL LOW (ref >90)
Glucose, Bld: 102 mg/dL — ABNORMAL HIGH (ref 70–99)
POTASSIUM: 4.2 meq/L (ref 3.7–5.3)
Sodium: 140 mEq/L (ref 137–147)

## 2013-12-19 LAB — PROTIME-INR
INR: 1.07 (ref 0.00–1.49)
PROTHROMBIN TIME: 13.9 s (ref 11.6–15.2)

## 2013-12-19 LAB — APTT: APTT: 30 s (ref 24–37)

## 2013-12-19 MED ORDER — BOOST / RESOURCE BREEZE PO LIQD
1.0000 | Freq: Every day | ORAL | Status: DC
  Administered 2013-12-20: 16:00:00 via ORAL
  Administered 2013-12-22: 1 via ORAL

## 2013-12-19 MED ORDER — SODIUM CHLORIDE 0.9 % IV SOLN
INTRAVENOUS | Status: DC
  Administered 2013-12-19 – 2013-12-20 (×2): via INTRAVENOUS

## 2013-12-19 MED ORDER — SODIUM CHLORIDE 0.9 % IV BOLUS (SEPSIS)
500.0000 mL | Freq: Once | INTRAVENOUS | Status: AC
  Administered 2013-12-19: 500 mL via INTRAVENOUS

## 2013-12-19 NOTE — H&P (Signed)
[address]            [place] [address]          [phone]     Terry Robles is an 73 y.o. male. 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 He has undergone a right thoracotomy for chest wall resection and right upper lobectomy on 07/30/2013. He was scheduled to have another treatment on Monday 12/16/2013 but because of hypotension he did not receive it. According to the patient, an [facility] nurse on 12/18/2013 found his blood pressure to be 100/60 while sitting and 60/40 standing. He complained of dizziness when standing. He had previously had hypotension and received IV fluids. He is not on any blood pressure medications. He was instructed to go to [facility] ER for further evaluation.  Chest x ray done showed a small right pleural effusion and a large (50%) right sided pneumothorax without mediastinal shift. Dr. Arnoldo Morale placed a 20 French right chest tube. Follow up chest x ray showed a small, right apical pneumothorax. Dr. Roxan Hockey was then contacted to accept the patient in transfer for further evaluation and management of the right chest tube.  Past Medical History: Past Medical History  Diagnosis Date  . Hypercholesteremia 05/14/2011  . BPH (benign prostatic hyperplasia) 05/14/2011    takes Uroxatral daily  . GERD (gastroesophageal reflux disease)   . 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 Cancer     squamous cell carcinoma right superior sulcus  . Lung cancer     right lung  . Shortness of breath     weather related  . Pneumonia 1965  . Headache(784.0)     occasionally  . Joint pain   . Maintenance chemotherapy   . Urinary frequency   . History of shingles   . Anemia     Past Surgical History: Past Surgical  History  Procedure Laterality Date  . Other surgical history      benign tumor removed from left leg several years ago  . Other surgical history      had melanoma/squamous cell removed previously per patient  . Tumor excision      Left knee  . Colonoscopy w/ biopsies and polypectomy    . Video bronchoscopy N/A 07/30/2013    Procedure: VIDEO BRONCHOSCOPY;  Surgeon: Grace Isaac, MD;  Location: [place];  Service: Thoracic;  Laterality: N/A;  . Mediastinoscopy N/A 07/30/2013    Procedure: MEDIASTINOSCOPY;  Surgeon: Grace Isaac, MD;  Location: [place];  Service: Thoracic;  Laterality: N/A;  . Video assisted thoracoscopy (vats)/wedge resection Right 07/30/2013    Procedure: VIDEO ASSISTED THORACOSCOPY;  Surgeon: Grace Isaac, MD;  Location: [place];  Service: Thoracic;  Laterality: Right;  . Thoracotomy  07/30/2013    Procedure: THORACOTOMY FOR CHEST WALL RESECTION, RIGHT UPPER LOBECTOMY;  Surgeon: Grace Isaac, MD;  Location: [place];  Service: Thoracic;;  . Adenoidectomy  as a child  . Tonsillectomy    . Portacath placement Left 10/30/2013    Procedure: INSERTION PORT-A-CATH WITH ULTRASOUND GUIDANCE AND FLUORO;  Surgeon: Grace Isaac, MD;  Location: [place];  Service: Thoracic;  Laterality: Left;  Family History: Family History  Problem Relation Age of Onset  . COPD Paternal Grandfather   . Breast cancer Paternal Aunt     Social History:  He reports that he has quit smoking. His smoking use included Cigarettes. He has a 42 pack-year smoking history. He has never used smokeless tobacco. He reports that he drinks alcohol. He reports that he does not use illicit drugs.  Allergies:  No Known Allergies  Medications Prior to Admission  Medication Sig Dispense Refill  . alfuzosin (UROXATRAL) 10 MG 24 hr tablet Take 10 mg by mouth daily with breakfast.      . cycloSPORINE (RESTASIS) 0.05 % ophthalmic emulsion 1 drop 2 (two) times daily.      . Multiple Vitamin (MULITIVITAMIN WITH  MINERALS) TABS Take 1 tablet by mouth daily.      Marland Kitchen oxyCODONE-acetaminophen (PERCOCET/ROXICET) 5-325 MG per tablet Take 1 tablet by mouth every 6 (six) hours as needed for severe pain.  60 tablet  0  . polyethylene glycol (MIRALAX / GLYCOLAX) packet Take 17 g by mouth daily.      Marland Kitchen HYDROcodone-homatropine (HYCODAN) 5-1.5 MG/5ML syrup Take 5 mLs by mouth every 6 (six) hours as needed for cough.  240 mL  0  . prochlorperazine (COMPAZINE) 5 MG tablet Take 5 mg by mouth every 6 (six) hours as needed for nausea or vomiting.        Review of Systems:  Cardiac Review of Systems: Y or N  Chest Pain [ N ] Resting SOB [ N ] Exertional SOB [ Y ] Orthopnea Aqua.Slicker ]  Pedal Edema Aqua.Slicker ] Palpitations Aqua.Slicker ] Syncope Aqua.Slicker ] Presyncope [ N ]  General Review of Systems: [Y] = yes '[ ]' =no  Constitional: ; fatigue [Y ]; nausea Aqua.Slicker ]; night sweats [ N]; fever Aqua.Slicker ]; or chills [ N];  Eye : blurred vision Aqua.Slicker ]; diplopia Aqua.Slicker ]; vision changes Aqua.Slicker ]; Amaurosis fugax[ N];  Resp: cough Aqua.Slicker ]; wheezing[N ]; hemoptysis[ N]; shortness of breath[Y ]; paroxysmal nocturnal dyspnea[N ]; dyspnea on exertion[ Y]; or orthopnea[N ];  GI: vomiting[ N]; dysphagia[N ]; melena[N ]; hematochezia Aqua.Slicker ]; history of GERD [Y] GU: kidney stones Aqua.Slicker ]; hematuria[N ]; dysuria Aqua.Slicker ]; nocturia[ N]; History of BPH [y] Skin: rash, swelling[N ];, hair loss[Y ]; peripheral edema[N ]; or itching[N ];  Musculosketetal: myalgias[ N]; joint swelling[N ]; joint erythema[ N]; joint pain[N ]; back pain[ N];  Heme/Lymph: bruising[N ]; bleeding[N ]; anemia[Y ];  Neuro: TIA[N ];  stroke[N ]; vertigo[ N]; seizures[N ]; paresthesias[ N]; difficulty walking[N ];  Psych:depression[N ]; anxiety[N ];  Endocrine: diabetes[ N]; thyroid dysfunction[N ];    BP 102/57  Pulse 73  Temp(Src) 98.4 F (36.9 C) (Oral)  Resp 13  Ht '6\' 2"'  (1.88 m)  Wt 182 lb 1.6 oz (82.6 kg)  BMI 23.37 kg/m2  SpO2 100%  Physical Exam: General appearance: alert, cooperative and no distress HEENT:  Head-normocephalic,hair loss secondary to chemo Eyes-EOMI, PERRLA, sclera are non icteric Neck-supple, no JVD Heart: RRR, no murmur Lungs: Slightly diminished right apex;clear on left. No rales, wheezes, or rhonchi. Abdomen: normal findings: bowel sounds normal Extremities: no edema, redness or tenderness in the calves or thighs and Both lower extremities are warm and there are palpable DP bilaterally Neurologic: intact  Diagnostic Studies and Laboratory Results: Results for orders placed during the hospital encounter of 12/18/13 (from the past 48 hour(s))  CBC WITH DIFFERENTIAL     Status: Abnormal   Collection  Time    12/18/13  4:10 PM      Result Value Ref Range   WBC 2.1 (*) 4.0 - 10.5 K/uL   RBC 2.55 (*) 4.22 - 5.81 MIL/uL   Hemoglobin 8.6 (*) 13.0 - 17.0 g/dL   HCT 24.8 (*) 39.0 - 52.0 %   MCV 97.3  78.0 - 100.0 fL   MCH 33.7  26.0 - 34.0 pg   MCHC 34.7  30.0 - 36.0 g/dL   RDW 16.8 (*) 11.5 - 15.5 %   Platelets 64 (*) 150 - 400 K/uL   Comment: SPECIMEN CHECKED FOR CLOTS   Neutrophils Relative % 51  43 - 77 %   Lymphocytes Relative 33  12 - 46 %   Monocytes Relative 16 (*) 3 - 12 %   Eosinophils Relative 0  0 - 5 %   Basophils Relative 0  0 - 1 %   Neutro Abs 1.1 (*) 1.7 - 7.7 K/uL   Lymphs Abs 0.7  0.7 - 4.0 K/uL   Monocytes Absolute 0.3  0.1 - 1.0 K/uL   Eosinophils Absolute 0.0  0.0 - 0.7 K/uL   Basophils Absolute 0.0  0.0 - 0.1 K/uL  COMPREHENSIVE METABOLIC PANEL     Status: Abnormal   Collection Time    12/18/13  4:10 PM      Result Value Ref Range   Sodium 139  137 - 147 mEq/L   Potassium 3.7  3.7 - 5.3 mEq/L   Chloride 100  96 - 112 mEq/L   CO2 26  19 - 32 mEq/L   Glucose, Bld 111 (*) 70 - 99 mg/dL   BUN 15  6 - 23 mg/dL   Creatinine, Ser 0.88  0.50 - 1.35 mg/dL   Calcium 9.4  8.4 - 10.5 mg/dL   Total Protein 6.9  6.0 - 8.3 g/dL   Albumin 3.1 (*) 3.5 - 5.2 g/dL   AST 17  0 - 37 U/L   ALT 16  0 - 53 U/L   Alkaline Phosphatase 126 (*) 39 - 117 U/L   Total  Bilirubin 0.4  0.3 - 1.2 mg/dL   GFR calc non Af Amer 84 (*) >90 mL/min   GFR calc Af Amer >90  >90 mL/min   Comment: (NOTE)     The eGFR has been calculated using the CKD EPI equation.     This calculation has not been validated in all clinical situations.     eGFR's persistently <90 mL/min signify possible Chronic Kidney     Disease.   Anion gap 13  5 - 15  CBC     Status: Abnormal   Collection Time    12/19/13  4:05 AM      Result Value Ref Range   WBC 2.4 (*) 4.0 - 10.5 K/uL   RBC 2.63 (*) 4.22 - 5.81 MIL/uL   Hemoglobin 8.9 (*) 13.0 - 17.0 g/dL   HCT 26.2 (*) 39.0 - 52.0 %   MCV 99.6  78.0 - 100.0 fL   MCH 33.8  26.0 - 34.0 pg   MCHC 34.0  30.0 - 36.0 g/dL   RDW 17.3 (*) 11.5 - 15.5 %   Platelets 55 (*) 150 - 400 K/uL   Comment: REPEATED TO VERIFY     PLATELET COUNT CONFIRMED BY SMEAR  BASIC METABOLIC PANEL     Status: Abnormal   Collection Time    12/19/13  4:05 AM      Result Value Ref  Range   Sodium 140  137 - 147 mEq/L   Potassium 4.2  3.7 - 5.3 mEq/L   Chloride 104  96 - 112 mEq/L   CO2 26  19 - 32 mEq/L   Glucose, Bld 102 (*) 70 - 99 mg/dL   BUN 14  6 - 23 mg/dL   Creatinine, Ser 0.87  0.50 - 1.35 mg/dL   Calcium 9.0  8.4 - 10.5 mg/dL   GFR calc non Af Amer 84 (*) >90 mL/min   GFR calc Af Amer >90  >90 mL/min   Comment: (NOTE)     The eGFR has been calculated using the CKD EPI equation.     This calculation has not been validated in all clinical situations.     eGFR's persistently <90 mL/min signify possible Chronic Kidney     Disease.   Anion gap 10  5 - 15  PROTIME-INR     Status: None   Collection Time    12/19/13  4:05 AM      Result Value Ref Range   Prothrombin Time 13.9  11.6 - 15.2 seconds   INR 1.07  0.00 - 1.49  APTT     Status: None   Collection Time    12/19/13  4:05 AM      Result Value Ref Range   aPTT 30  24 - 37 seconds   Dg Chest 2 View  12/18/2013   CLINICAL DATA:  Several day history of weakness and hypotension; history of lung  malignancy and thoracotomy for sustained with resection of 3 ribs following upper lobectomy.  EXAM: CHEST  2 VIEW  COMPARISON:  Chest x-ray from an abdominal series of December 05, 2013 as well as October 30, 2013  FINDINGS: There is hyperlucency in the right pulmonary apex and increased lucency elsewhere in the right hemi thorax consistent with an approximately 50%. There is no shift of the mediastinum. There is a small right pleural effusion layering posteriorly. The left lung is clear. A Port-A-Cath appliance tip overlies the inferior aspect of the SVC. The cardiac silhouette is normal in size. The right second, third, and fourth ribs are surgically absent.  IMPRESSION: There is a large (50%) right-sided pneumothorax without mediastinal shift. There is small pleural effusion layering posteriorly.  Critical Value/emergent results were called by telephone at the time of interpretation on 12/18/2013 at 5:49 pm to Dr. Veryl Speak , who verbally acknowledged these results.   Electronically Signed   By: David  Martinique   On: 12/18/2013 17:52   Dg Chest Port 1 View  12/19/2013   CLINICAL DATA:  Followup pneumothorax  EXAM: PORTABLE CHEST - 1 VIEW  COMPARISON:  12/18/2013  FINDINGS: A left chest wall port is again seen. The cardiac shadow is stable. A small right-sided pneumothorax catheter is seen. Residual right apical pneumothorax is noted. Destructive lesion of the left seventh rib posteriorly is again seen. No new focal abnormality is seen.  IMPRESSION: Stable right pneumothorax   Electronically Signed   By: Inez Catalina M.D.   On: 12/19/2013 07:39   Dg Chest Port 1 View  12/18/2013   CLINICAL DATA:  Right-sided chest tube placement.  EXAM: PORTABLE CHEST - 1 VIEW  COMPARISON:  Same day.  FINDINGS: Cardiomediastinal silhouette appears stable. Left subclavian Port-A-Cath is noted and unchanged in position. There is been interval placement of right-sided chest tube. Mild right apical pneumothorax is noted which is  significantly improved compared to prior exam.  IMPRESSION: Mild right  apical pneumothorax is noted which is significantly improved status post right-sided chest tube placement.   Electronically Signed   By: Sabino Dick M.D.   On: 12/18/2013 20:50     Assessment/Plan 1. Spontaneous small, right apical pneumothorax. Patient had right chest tube (size 20 [place]) placed by Dr. Arnoldo Morale in [facility] ER last evening. There is no air leak this morning. Chest tube to remain to water seal. Check CXR in am. 2. Pancytopenia-has received chemotherapy for lung cancer. Dr. Inda Merlin (patient's oncologist) will evaluate.  Margie Brink M, PA-C 12/19/2013, 1:20 PM

## 2013-12-19 NOTE — Progress Notes (Signed)
NS bolus in progress. BP 94/51

## 2013-12-19 NOTE — ED Provider Notes (Signed)
CSN: [id number]     Arrival date & time 12/18/13  1531 History   First MD Initiated Contact with Patient 12/18/13 1550     Chief Complaint  Patient presents with  . Hypotension     (Consider location/radiation/quality/duration/timing/severity/associated sxs/prior Treatment) HPI Comments: Patient is a 73 year old male with history of Non Small Cell Lung Ca, status post resection of the right upper lobe with removal of three ribs in 07/2013 by Dr. Pia Mau.  He was sent today for evaluation of orthostatic vital signs and weakness with standing for the past week.  He was given fluids at the doctors' office, however remains orthostatic.  He denies any chest pain, fevers, productive cough.  Patient is a 73 y.o. male presenting with weakness. The history is provided by the patient.  Weakness This is a new problem. The current episode started more than 1 week ago. The problem occurs constantly. The problem has been gradually worsening. Pertinent negatives include no chest pain, no abdominal pain, no headaches and no shortness of breath. The symptoms are aggravated by standing. Nothing relieves the symptoms. He has tried nothing for the symptoms.    Past Medical History  Diagnosis Date  . Hypercholesteremia 05/14/2011  . BPH (benign prostatic hyperplasia) 05/14/2011    takes Uroxatral daily  . GERD (gastroesophageal reflux disease)   . 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 Cancer     squamous cell carcinoma right superior sulcus  . Lung cancer     right lung  . Shortness of breath     weather related  . Pneumonia 1965  . Headache(784.0)     occasionally  . Joint pain   . Maintenance chemotherapy   . Urinary frequency   . History of shingles   . Anemia    Past Surgical History  Procedure Laterality Date  . Other surgical history      benign tumor removed from left leg several years ago  . Other surgical history      had melanoma/squamous cell removed previously per  patient  . Tumor excision      Left knee  . Colonoscopy w/ biopsies and polypectomy    . Video bronchoscopy N/A 07/30/2013    Procedure: VIDEO BRONCHOSCOPY;  Surgeon: Grace Isaac, MD;  Location: [place];  Service: Thoracic;  Laterality: N/A;  . Mediastinoscopy N/A 07/30/2013    Procedure: MEDIASTINOSCOPY;  Surgeon: Grace Isaac, MD;  Location: [place];  Service: Thoracic;  Laterality: N/A;  . Video assisted thoracoscopy (vats)/wedge resection Right 07/30/2013    Procedure: VIDEO ASSISTED THORACOSCOPY;  Surgeon: Grace Isaac, MD;  Location: [place];  Service: Thoracic;  Laterality: Right;  . Thoracotomy  07/30/2013    Procedure: THORACOTOMY FOR CHEST WALL RESECTION, RIGHT UPPER LOBECTOMY;  Surgeon: Grace Isaac, MD;  Location: [place];  Service: Thoracic;;  . Adenoidectomy  as a child  . Tonsillectomy    . Portacath placement Left 10/30/2013    Procedure: INSERTION PORT-A-CATH WITH ULTRASOUND GUIDANCE AND FLUORO;  Surgeon: Grace Isaac, MD;  Location: [facility];  Service: Thoracic;  Laterality: Left;   Family History  Problem Relation Age of Onset  . COPD Paternal Grandfather   . Breast cancer Paternal Aunt    History  Substance Use Topics  . Smoking status: Former Smoker -- 1.00 packs/day for 42 years    Types: Cigarettes  . Smokeless tobacco: Never Used     Comment: quit  smoking almost 87yrs ago  . Alcohol Use: Yes     Comment: occasionally beer    Review of Systems  Constitutional: Positive for fatigue.  Respiratory: Negative for shortness of breath.   Cardiovascular: Negative for chest pain.  Gastrointestinal: Negative for abdominal pain.  Neurological: Positive for weakness. Negative for headaches.  All other systems reviewed and are negative.     Allergies  Review of patient's allergies indicates no known allergies.  Home Medications   Prior to Admission medications   Medication Sig Start Date End Date Taking? Authorizing Provider  alfuzosin (UROXATRAL) 10  MG 24 hr tablet Take 10 mg by mouth daily with breakfast.   Yes Historical Provider, MD  cycloSPORINE (RESTASIS) 0.05 % ophthalmic emulsion 1 drop 2 (two) times daily.   Yes Historical Provider, MD  Multiple Vitamin (MULITIVITAMIN WITH MINERALS) TABS Take 1 tablet by mouth daily.   Yes Historical Provider, MD  oxyCODONE-acetaminophen (PERCOCET/ROXICET) 5-325 MG per tablet Take 1 tablet by mouth every 6 (six) hours as needed for severe pain. 11/27/13  Yes Adrena E Johnson, PA-C  polyethylene glycol (MIRALAX / GLYCOLAX) packet Take 17 g by mouth daily.   Yes Historical Provider, MD  HYDROcodone-homatropine (HYCODAN) 5-1.5 MG/5ML syrup Take 5 mLs by mouth every 6 (six) hours as needed for cough. 11/27/13   Carlton Adam, PA-C  prochlorperazine (COMPAZINE) 5 MG tablet Take 5 mg by mouth every 6 (six) hours as needed for nausea or vomiting.    Historical Provider, MD   BP 102/57  Pulse 73  Temp(Src) 98.4 F (36.9 C) (Oral)  Resp 13  Ht 6\' 2"  (1.88 m)  Wt 182 lb 1.6 oz (82.6 kg)  BMI 23.37 kg/m2  SpO2 100% Physical Exam  Nursing note and vitals reviewed. Constitutional: He is oriented to person, place, and time. He appears well-developed and well-nourished.  Patient is a 73 year old male in NAD.  He is awake, alert, and appropriate.  He appears chronically ill and somewhat pale.  HENT:  Head: Normocephalic and atraumatic.  Mouth/Throat: Oropharynx is clear and moist.  Neck: Normal range of motion. Neck supple.  Cardiovascular: Normal rate, regular rhythm and normal heart sounds.   No murmur heard. Pulmonary/Chest: Effort normal. No respiratory distress. He has no wheezes. He has no rales.  Breath sounds are diminished on the right.  Abdominal: Soft. Bowel sounds are normal. He exhibits no distension. There is no tenderness.  Musculoskeletal: Normal range of motion. He exhibits no edema.  Neurological: He is alert and oriented to person, place, and time.  Skin: Skin is warm and dry. He is not  diaphoretic.    ED Course  Procedures (including critical care time) Labs Review Labs Reviewed  CBC WITH DIFFERENTIAL - Abnormal; Notable for the following:    WBC 2.1 (*)    RBC 2.55 (*)    Hemoglobin 8.6 (*)    HCT 24.8 (*)    RDW 16.8 (*)    Platelets 64 (*)    Monocytes Relative 16 (*)    Neutro Abs 1.1 (*)    All other components within normal limits  COMPREHENSIVE METABOLIC PANEL - Abnormal; Notable for the following:    Glucose, Bld 111 (*)    Albumin 3.1 (*)    Alkaline Phosphatase 126 (*)    GFR calc non Af Amer 84 (*)    All other components within normal limits  CBC - Abnormal; Notable for the following:    WBC 2.4 (*)  RBC 2.63 (*)    Hemoglobin 8.9 (*)    HCT 26.2 (*)    RDW 17.3 (*)    Platelets 55 (*)    All other components within normal limits  BASIC METABOLIC PANEL - Abnormal; Notable for the following:    Glucose, Bld 102 (*)    GFR calc non Af Amer 84 (*)    All other components within normal limits  PROTIME-INR  APTT    Imaging Review Dg Chest 2 View  12/18/2013   CLINICAL DATA:  Several day history of weakness and hypotension; history of lung malignancy and thoracotomy for sustained with resection of 3 ribs following upper lobectomy.  EXAM: CHEST  2 VIEW  COMPARISON:  Chest x-ray from an abdominal series of December 05, 2013 as well as October 30, 2013  FINDINGS: There is hyperlucency in the right pulmonary apex and increased lucency elsewhere in the right hemi thorax consistent with an approximately 50%. There is no shift of the mediastinum. There is a small right pleural effusion layering posteriorly. The left lung is clear. A Port-A-Cath appliance tip overlies the inferior aspect of the SVC. The cardiac silhouette is normal in size. The right second, third, and fourth ribs are surgically absent.  IMPRESSION: There is a large (50%) right-sided pneumothorax without mediastinal shift. There is small pleural effusion layering posteriorly.  Critical  Value/emergent results were called by telephone at the time of interpretation on 12/18/2013 at 5:49 pm to Dr. Veryl Speak , who verbally acknowledged these results.   Electronically Signed   By: David  Martinique   On: 12/18/2013 17:52   Dg Chest Port 1 View  12/19/2013   CLINICAL DATA:  Followup pneumothorax  EXAM: PORTABLE CHEST - 1 VIEW  COMPARISON:  12/18/2013  FINDINGS: A left chest wall port is again seen. The cardiac shadow is stable. A small right-sided pneumothorax catheter is seen. Residual right apical pneumothorax is noted. Destructive lesion of the left seventh rib posteriorly is again seen. No new focal abnormality is seen.  IMPRESSION: Stable right pneumothorax   Electronically Signed   By: Inez Catalina M.D.   On: 12/19/2013 07:39   Dg Chest Port 1 View  12/18/2013   CLINICAL DATA:  Right-sided chest tube placement.  EXAM: PORTABLE CHEST - 1 VIEW  COMPARISON:  Same day.  FINDINGS: Cardiomediastinal silhouette appears stable. Left subclavian Port-A-Cath is noted and unchanged in position. There is been interval placement of right-sided chest tube. Mild right apical pneumothorax is noted which is significantly improved compared to prior exam.  IMPRESSION: Mild right apical pneumothorax is noted which is significantly improved status post right-sided chest tube placement.   Electronically Signed   By: Sabino Dick M.D.   On: 12/18/2013 20:50     Date: 12/19/2013  Rate: 80  Rhythm: normal sinus rhythm  QRS Axis: normal  Intervals: normal  ST/T Wave abnormalities: none  Conduction Disutrbances:none  Narrative Interpretation:   Old EKG Reviewed: unchanged    MDM   Final diagnoses:  Pneumothorax, right  History of lung cancer    Patient remains orthostatic upon arrival to the ED.  Workup reveals anemia, thrombocytopenia, and a significant right sided pneumothorax, that is new from the most recent chest xray.    I spoke with Dr. Roxan Hockey from Thoracic Surgery regarding this  finding.  It was his opinion the patient would require a chest tube and admission.  I then spoke with Dr. Arnoldo Morale from General Surgery who came to the ED  and placed a small caliber chest tube with heimlich valve.  It was his opinion the patient would be best served transferred to [facility] as this is where his care has been provided.  I spoke again with Dr. Roxan Hockey who agrees to accept the patient is transfer.  CRITICAL CARE Performed by: Veryl Speak Total critical care time: 60 minutes Critical care time was exclusive of separately billable procedures and treating other patients. Critical care was necessary to treat or prevent imminent or life-threatening deterioration. Critical care was time spent personally by me on the following activities: development of treatment plan with patient and/or surrogate as well as nursing, discussions with consultants, evaluation of patient's response to treatment, examination of patient, obtaining history from patient or surrogate, ordering and performing treatments and interventions, ordering and review of laboratory studies, ordering and review of radiographic studies, pulse oximetry and re-evaluation of patient's condition.     Veryl Speak, MD 12/19/13 [phone]

## 2013-12-19 NOTE — Progress Notes (Signed)
INITIAL NUTRITION ASSESSMENT  DOCUMENTATION CODES Per approved criteria  -Severe malnutrition in the context of chronic illness   INTERVENTION:  Magic Cups BID with meals.  Resource Breeze mixed with Ginger Ale once daily.  Carnation Instant Breakfast once daily.  NUTRITION DIAGNOSIS: Malnutrition related to inadequate oral intake as evidenced by 8% weight loss in 2 months with severe depletion of muscle mass.   Goal: Intake to meet >90% of estimated nutrition needs.  Monitor:  PO intake, labs, weight trend.  Reason for Assessment: MST  73 y.o. male  Admitting Dx: Left arm coolness  ASSESSMENT: 73 y.o. male stage III a lung cancer who presents with chief complaint: left arm coolness.   Patient reports his usual weight in December of 2014 was 245 lb, now down to 182 lb. He had a period of time ~6 months ago that he was eating very poorly and lost a lot of weight. Weight continues to trend down. He does not like Ensure or Boost supplements, has not tried [employer], [employer], or [employer], but agreeable to try them.  Nutrition Focused Physical Exam:  Subcutaneous Fat:  Orbital Region: mild depletion Upper Arm Region: mild depleiton Thoracic and Lumbar Region: moderate depletion  Muscle:  Temple Region: moderate depletion Clavicle Bone Region: moderate depletion Clavicle and Acromion Bone Region: moderate depletion Scapular Bone Region: moderate depletion Dorsal Hand: moderate depletion Patellar Region: severe depletion Anterior Thigh Region: severe depletion Posterior Calf Region: severe depletion  Edema: none  Pt meets criteria for severe MALNUTRITION in the context of chronic illness as evidenced by 8% weight loss within 3 months with severe depletion of muscle mass.   Height: Ht Readings from Last 1 Encounters:  12/18/13 6\' 2"  (1.88 m)    Weight: Wt Readings from Last 1 Encounters:  12/18/13 182 lb 1.6 oz (82.6 kg)    Ideal  Body Weight: 86.4 kg  % Ideal Body Weight: 96%  Wt Readings from Last 10 Encounters:  12/18/13 182 lb 1.6 oz (82.6 kg)  12/12/13 185 lb 8 oz (84.142 kg)  12/05/13 190 lb (86.183 kg)  12/03/13 192 lb 6.4 oz (87.272 kg)  11/14/13 194 lb (87.998 kg)  11/13/13 194 lb 8 oz (88.225 kg)  11/07/13 198 lb 1.6 oz (89.858 kg)  10/30/13 198 lb (89.812 kg)  10/30/13 198 lb (89.812 kg)  10/24/13 198 lb 10.2 oz (90.1 kg)    Usual Body Weight: 198 lb 2 months ago; 245 lb one year ago  % Usual Body Weight: 74-92%  BMI:  Body mass index is 23.37 kg/(m^2).  Estimated Nutritional Needs: Kcal: 2500-2800 Protein: 130-150 gm Fluid: 2.5 L  Skin: WDL  Diet Order: Cardiac  EDUCATION NEEDS: -Education needs addressed   Intake/Output Summary (Last 24 hours) at 12/19/13 1157 Last data filed at 12/19/13 0651  Gross per 24 hour  Intake    120 ml  Output    250 ml  Net   -130 ml    Last BM: 9/23   Labs:   Recent Labs Lab 12/16/13 1146 12/18/13 1610 12/19/13 0405  NA 138 139 140  K 3.8 3.7 4.2  CL  --  100 104  CO2 23 26 26   BUN 17.4 15 14   CREATININE 0.8 0.88 0.87  CALCIUM 9.5 9.4 9.0  GLUCOSE 137 111* 102*    CBG (last 3)  No results found for this basename: GLUCAP,  in the last 72 hours  Scheduled Meds: . alfuzosin  10 mg Oral Q  breakfast  . cycloSPORINE  1 drop Both Eyes BID  . multivitamin with minerals  1 tablet Oral Daily  . polyethylene glycol  17 g Oral Daily    Continuous Infusions:   Past Medical History  Diagnosis Date  . Hypercholesteremia 05/14/2011  . BPH (benign prostatic hyperplasia) 05/14/2011    takes Uroxatral daily  . GERD (gastroesophageal reflux disease)   . 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 Cancer     squamous cell carcinoma right superior sulcus  . Lung cancer     right lung  . Shortness of breath     weather related  . Pneumonia 1965  . Headache(784.0)     occasionally  . Joint pain   . Maintenance chemotherapy    . Urinary frequency   . History of shingles   . Anemia     Past Surgical History  Procedure Laterality Date  . Other surgical history      benign tumor removed from left leg several years ago  . Other surgical history      had melanoma/squamous cell removed previously per patient  . Tumor excision      Left knee  . Colonoscopy w/ biopsies and polypectomy    . Video bronchoscopy N/A 07/30/2013    Procedure: VIDEO BRONCHOSCOPY;  Surgeon: Grace Isaac, MD;  Location: [place];  Service: Thoracic;  Laterality: N/A;  . Mediastinoscopy N/A 07/30/2013    Procedure: MEDIASTINOSCOPY;  Surgeon: Grace Isaac, MD;  Location: [place];  Service: Thoracic;  Laterality: N/A;  . Video assisted thoracoscopy (vats)/wedge resection Right 07/30/2013    Procedure: VIDEO ASSISTED THORACOSCOPY;  Surgeon: Grace Isaac, MD;  Location: [place];  Service: Thoracic;  Laterality: Right;  . Thoracotomy  07/30/2013    Procedure: THORACOTOMY FOR CHEST WALL RESECTION, RIGHT UPPER LOBECTOMY;  Surgeon: Grace Isaac, MD;  Location: [place];  Service: Thoracic;;  . Adenoidectomy  as a child  . Tonsillectomy    . Portacath placement Left 10/30/2013    Procedure: INSERTION PORT-A-CATH WITH ULTRASOUND GUIDANCE AND FLUORO;  Surgeon: Grace Isaac, MD;  Location: [place];  Service: Thoracic;  Laterality: Left;    Molli Barrows, RD, LDN, [place] Pager [phone] After Hours Pager [phone]

## 2013-12-19 NOTE — Care Management Note (Addendum)
    Page 1 of 2   12/23/2013     1:36:17 PM CARE MANAGEMENT NOTE 12/23/2013  Patient:  Terry Robles, Terry Robles   Account Number:  [id number]  Date Initiated:  12/19/2013  Documentation initiated by:  Marvetta Gibbons  Subjective/Objective Assessment:   Pt admitted with chest tube placement and hypotension     Action/Plan:   PTA pt lived at home with wife- is active with [facility] for home health services- [place]- will need resumption order at discharge   Anticipated DC Date:  12/23/2013   Anticipated DC Plan:  [place]  CM consult      [facility] Choice  Resumption Of Svcs/PTA Provider   Choice offered to / List presented to:  C-1 Patient        [place] arranged  HH-1 RN      [place].   Status of service:  Completed, signed off Medicare Important Message given?  YES (If response is "NO", the following Medicare IM given date fields will be blank) Date Medicare IM given:  12/20/2013 Medicare IM given by:  Marvetta Gibbons Date Additional Medicare IM given:  12/23/2013 Additional Medicare IM given by:  Marvetta Gibbons  Discharge Disposition:  [place]  Per UR Regulation:  Reviewed for med. necessity/level of care/duration of stay  If discussed at [place] of Stay Meetings, dates discussed:    Comments:  12/23/13- 1300- Marvetta Gibbons RN, BSN [phone] Pt for d/c home today- will resume HH-RN with [facility]- notified Debbie with [facility] of resumption order- pt given additional copy of [place] IM this am.   12/20/13- 1030- Hurman Ketelsen RN, BSN [phone] Spoke with pt at bedside- per conversation pt confirms that he still had a HH-RN coming out to see him PTA with [facility]- would like to continue [facility] services with [facility]- will need resumption order for that at time of discharge- copy of admission Medicare IM given to pt.

## 2013-12-19 NOTE — Progress Notes (Signed)
Pt's BP 96/47 lying down. BP drops to 85/46 in sitting position. Dr Lucianne Lei Tright notified. 500cc NS Bolus started per M.D. Orders.

## 2013-12-19 NOTE — Progress Notes (Addendum)
      [address]       [place] [address]             [phone]            Subjective: Patient denies any difficulty breathing or dizziness.  Objective: Vital signs in last 24 hours: Temp:  [97.5 F (36.4 C)-98.7 F (37.1 C)] 97.5 F (36.4 C) (09/24 0535) Pulse Rate:  [73-90] 73 (09/24 0535) Cardiac Rhythm:  [-]  Resp:  [11-24] 13 (09/24 0535) BP: (93-127)/(56-78) 102/57 mmHg (09/24 0535) SpO2:  [96 %-100 %] 100 % (09/24 0535) Weight:  [180 lb (81.647 kg)-182 lb 1.6 oz (82.6 kg)] 182 lb 1.6 oz (82.6 kg) (09/23 2222)     Intake/Output from previous day: 09/23 0701 - 09/24 0700 In: 120 [P.O.:120] Out: 250 [Urine:250]   Physical Exam:  Cardiovascular: RRR, no murmurs, gallops, or rubs. Pulmonary: Clear to auscultation bilaterally; no rales, wheezes, or rhonchi. Abdomen: Soft, non tender, bowel sounds present. Extremities: Mild bilateral lower extremity edema. Wounds: Clean and dry.  No erythema or signs of infection. Chest Tube: to water seal, no air leak  Lab Results: CBC: Recent Labs  12/18/13 1610 12/19/13 0405  WBC 2.1* 2.4*  HGB 8.6* 8.9*  HCT 24.8* 26.2*  PLT 64* 55*   BMET:  Recent Labs  12/18/13 1610 12/19/13 0405  NA 139 140  K 3.7 4.2  CL 100 104  CO2 26 26  GLUCOSE 111* 102*  BUN 15 14  CREATININE 0.88 0.87  CALCIUM 9.4 9.0    PT/INR:  Recent Labs  12/19/13 0405  LABPROT 13.9  INR 1.07   ABG:  INR: Will add last result for INR, ABG once components are confirmed Will add last 4 CBG results once components are confirmed  Assessment/Plan:  1. CV - SR. SBP 100-120. 2.  Pulmonary - Chest tube to water seal. There is no air leak.CXR this am shows patient rotated to the left, small right apical pneumothorax.. Will discuss management of chest tube with Dr. Roxan Hockey. Check CXR in am. 3. Anemia-H and H stable at 8.9 and 26.2. 4. Thrombocytopenia-platelets decreased from 64,000 to 55,000. Was supposed to  receive chemo on Monday, but due to pancytopenia and hypotension on hold.  Charlane Westry MPA-C 12/19/2013,7:44 AM

## 2013-12-19 NOTE — Progress Notes (Signed)
Utilization review completed.  

## 2013-12-20 ENCOUNTER — Encounter (HOSPITAL_COMMUNITY): Payer: Self-pay | Admitting: Surgical

## 2013-12-20 ENCOUNTER — Inpatient Hospital Stay (HOSPITAL_COMMUNITY): Payer: Medicare Other

## 2013-12-20 LAB — PREPARE RBC (CROSSMATCH)

## 2013-12-20 LAB — CBC
HCT: 21.2 % — ABNORMAL LOW (ref 39.0–52.0)
Hemoglobin: 7.2 g/dL — ABNORMAL LOW (ref 13.0–17.0)
MCH: 33.2 pg (ref 26.0–34.0)
MCHC: 34 g/dL (ref 30.0–36.0)
MCV: 97.7 fL (ref 78.0–100.0)
Platelets: 44 10*3/uL — ABNORMAL LOW (ref 150–400)
RBC: 2.17 MIL/uL — ABNORMAL LOW (ref 4.22–5.81)
RDW: 17.9 % — ABNORMAL HIGH (ref 11.5–15.5)
WBC: 1.6 10*3/uL — ABNORMAL LOW (ref 4.0–10.5)

## 2013-12-20 MED ORDER — SODIUM CHLORIDE 0.9 % IV SOLN
Freq: Once | INTRAVENOUS | Status: AC
Start: 2013-12-20 — End: 2013-12-20
  Administered 2013-12-20: 10:00:00 via INTRAVENOUS

## 2013-12-20 MED ORDER — LACTULOSE 10 GM/15ML PO SOLN
30.0000 g | Freq: Once | ORAL | Status: DC
  Filled 2013-12-20: qty 45

## 2013-12-20 NOTE — Progress Notes (Signed)
Paged NP to confirm blood transfer. RN was told that NP is still trying to reach Dr. Earlie Server to confirm plans to transfuse.

## 2013-12-20 NOTE — Progress Notes (Signed)
Pt oob to bedside commode, BP unchanged from  Prior to getting up. Pt refused to stay up in chair says he does not feel well

## 2013-12-20 NOTE — Progress Notes (Addendum)
      [address]       [place] [address]             [phone]            Subjective: Patient had a small, soft bowel movement this am. He feels weak.  Objective: Vital signs in last 24 hours: Temp:  [97.4 F (36.3 C)-98.4 F (36.9 C)] 97.7 F (36.5 C) (09/25 0302) Pulse Rate:  [69-89] 89 (09/25 0615) Cardiac Rhythm:  [-] Normal sinus rhythm (09/25 0302) Resp:  [12-23] 23 (09/25 0615) BP: (85-107)/(44-61) 107/61 mmHg (09/25 0615) SpO2:  [100 %] 100 % (09/25 0302)     Intake/Output from previous day: 09/24 0701 - 09/25 0700 In: 1068.3 [P.O.:220; I.V.:848.3] Out: 750 [Urine:650; Chest Tube:100]   Physical Exam:  Cardiovascular: RRR, no murmurs, gallops, or rubs. Pulmonary: Diminished at right apex and clear on left. No rales, wheezes, or rhonchi. Abdomen: Soft, non tender, bowel sounds present. Wounds: Dressing is clean and dry.  Chest Tube: to water seal, no air leak  Lab Results: CBC:  Recent Labs  12/19/13 0405 12/20/13 0400  WBC 2.4* 1.6*  HGB 8.9* 7.2*  HCT 26.2* 21.2*  PLT 55* 44*   BMET:   Recent Labs  12/18/13 1610 12/19/13 0405  NA 139 140  K 3.7 4.2  CL 100 104  CO2 26 26  GLUCOSE 111* 102*  BUN 15 14  CREATININE 0.88 0.87  CALCIUM 9.4 9.0    PT/INR:   Recent Labs  12/19/13 0405  LABPROT 13.9  INR 1.07   ABG:  INR: Will add last result for INR, ABG once components are confirmed Will add last 4 CBG results once components are confirmed  Assessment/Plan:  1. CV - SR. SBP labile. 2.  Pulmonary - Chest tube to water seal. There is no air leak.CXR this am appears to show patient with stable, small right apical pneumothorax. Per Dr. Servando Snare, remove chest tube. Check CXR after CT removal and in am. 3. Anemia-H and H further decreased to 7.2 and 21.2. Will discuss with Dr. Francie Massing and likely transfuse one unit 4. Thrombocytopenia-platelets further decreased to 44,000. Was supposed to receive chemo  on Monday, but due to pancytopenia and hypotension on hold.  ZIMMERMAN,DONIELLE MPA-C 12/20/2013,7:36 AM  Discussed with Dr Inda Merlin, will transfuse unit prbcs today for chemo induced anemia I have seen and examined Terry Robles with the above assessment  and plan.  Grace Isaac MD Bee[place] [phone] Office [phone] 12/20/2013 9:29 AM

## 2013-12-20 NOTE — Clinical Documentation Improvement (Signed)
"  Pancytopenia" documented in current medical record.  Known history of lung cancer s/p surgery 07/2013.  Currently receiving chemotherapy per Dr. Earlie Server.  Chemo was held due to pancytopenia and hypotension on Monday, 12/16/13 per progress note 12/20/13  If clinically appropriate, please document if a condition below provides greater specificity regarding the patient's pancytopenia:   - Antineoplastic Chemotherapy Induced Pancytopenia   - Pancytopenia, as documented   - Unable to Clinically Determine  Thank You,  Vilinda Flake RN BSN CCDS [place] Clinical Documentation Improvement

## 2013-12-20 NOTE — Discharge Summary (Signed)
Physician Discharge Summary  Patient ID: Terry Robles MRN: [id number] DOB/AGE: [birth year] 73 y.o.  Admit date: 12/18/2013 Discharge date: 12/23/2013  Admission Diagnoses: Spontaneous right pneumothorax  Discharge Diagnoses:  Active Problems:   Pneumothorax, right   Protein-calorie malnutrition, severe   Acquired thrombocytopenia  Thrombocytopenia- secondary to chemotherapy  Patient Active Problem List   Diagnosis Date Noted  . Acquired thrombocytopenia   . Protein-calorie malnutrition, severe 12/19/2013  . Pneumothorax, right 12/18/2013  . Arm pain, left 12/12/2013  . Superficial venous thrombosis of arm 12/12/2013  . Malignant neoplasm of right upper lobe of lung 07/30/2013  . 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 Lung mass 04/16/2013  . Chest pain at rest 05/14/2011  . Hypercholesteremia 05/14/2011  . BPH (benign prostatic hyperplasia) 05/14/2011        Problem List Items Addressed This Visit   Pneumothorax, right - Primary    Other Visit Diagnoses   History of lung cancer           History of Presenting Illness:  This is a 73 year old Caucasian male with a history of Stage IIIa Squamous cell right lung cancer who has been undergoing chemotherapy. He has undergone a right thoracotomy for chest wall resection and right upper lobectomy on 07/30/2013. He was scheduled to have another treatment on Monday 12/16/2013 but because of hypotension he did not receive it. According to the patient, an [facility] nurse on 12/18/2013 found his blood pressure to be 100/60 while sitting and 60/40 standing. He complained of dizziness when standing. He had previously had hypotension and received IV fluids. He is not on any blood pressure medications. He was instructed to go to [facility] for further evaluation. Chest x ray done showed a small right pleural effusion and a large (50%) right sided pneumothorax without mediastinal shift. Dr. Arnoldo Morale placed a 20  French right chest tube. Follow up chest x ray showed a small, right apical pneumothorax. Dr. Roxan Hockey was then contacted to accept the patient in transfer for further evaluation and management of the right chest tube.   Past Medical History:  Past Medical History   Diagnosis  Date   .  Hypercholesteremia  05/14/2011   .  BPH (benign prostatic hyperplasia)  05/14/2011     takes Uroxatral daily   .  GERD (gastroesophageal reflux disease)    .  Skin cancer      skin cancer squamous on left arm   .  Cancer      squamous cell carcinoma right superior sulcus   .  Lung cancer      right lung   .  Shortness of breath      weather related   .  Pneumonia  1965   .  Headache(784.0)      occasionally   .  Joint pain    .  Maintenance chemotherapy    .  Urinary frequency    .  History of shingles    .  Anemia     Past Surgical History:  Past Surgical History   Procedure  Laterality  Date   .  Other surgical history       benign tumor removed from left leg several years ago   .  Other surgical history       had melanoma/squamous cell removed previously per patient   .  Tumor excision       Left knee   .  Colonoscopy w/ biopsies and polypectomy     .  Video bronchoscopy  N/A  07/30/2013     Procedure: VIDEO BRONCHOSCOPY; Surgeon: Grace Isaac, MD; Location: [place]; Service: Thoracic; Laterality: N/A;   .  Mediastinoscopy  N/A  07/30/2013     Procedure: MEDIASTINOSCOPY; Surgeon: Grace Isaac, MD; Location: [place]; Service: Thoracic; Laterality: N/A;   .  Video assisted thoracoscopy (vats)/wedge resection  Right  07/30/2013     Procedure: VIDEO ASSISTED THORACOSCOPY; Surgeon: Grace Isaac, MD; Location: [place]; Service: Thoracic; Laterality: Right;   .  Thoracotomy   07/30/2013     Procedure: THORACOTOMY FOR CHEST WALL RESECTION, RIGHT UPPER LOBECTOMY; Surgeon: Grace Isaac, MD; Location: [place]; Service: Thoracic;;   .  Adenoidectomy   as a child   .  Tonsillectomy     .   Portacath placement  Left  10/30/2013     Procedure: INSERTION PORT-A-CATH WITH ULTRASOUND GUIDANCE AND FLUORO; Surgeon: Grace Isaac, MD; Location: [facility] OR; Service: Thoracic; Laterality: Left;    Family History:  Family History   Problem  Relation  Age of Onset   .  COPD  Paternal Grandfather    .  Breast cancer  Paternal Aunt     Social History:  He reports that he has quit smoking. His smoking use included Cigarettes. He has a 42 pack-year smoking history. He has never used smokeless tobacco. He reports that he drinks alcohol. He reports that he does not use illicit drugs.  Allergies:  No Known Allergies  Medications Prior to Admission   Medication  Sig  Dispense  Refill   .  alfuzosin (UROXATRAL) 10 MG 24 hr tablet  Take 10 mg by mouth daily with breakfast.     .  cycloSPORINE (RESTASIS) 0.05 % ophthalmic emulsion  1 drop 2 (two) times daily.     .  Multiple Vitamin (MULITIVITAMIN WITH MINERALS) TABS  Take 1 tablet by mouth daily.     Marland Kitchen  oxyCODONE-acetaminophen (PERCOCET/ROXICET) 5-325 MG per tablet  Take 1 tablet by mouth every 6 (six) hours as needed for severe pain.  60 tablet  0   .  polyethylene glycol (MIRALAX / GLYCOLAX) packet  Take 17 g by mouth daily.     Marland Kitchen  HYDROcodone-homatropine (HYCODAN) 5-1.5 MG/5ML syrup  Take 5 mLs by mouth every 6 (six) hours as needed for cough.  240 mL  0   .  prochlorperazine (COMPAZINE) 5 MG tablet  Take 5 mg by mouth every 6 (six) hours as needed for nausea or vomiting.         Discharged Condition: stable  Hospital Course: The patient was admitted in transfer. His chest tube has been observed and treated in a routine manner regarding reduction of the suction. The chest tube has been removed on 12/20/2013. Concurrently the patient has developed a significant thrombocytopenia with most recent platelet level at 34,000. He also has a significant anemia, with both of these felt to be related to chemotherapy. He did receive packed blood cells . His  last H and H was 9 and 26 . He has also had some degree of hypotension associated with this current illness. This is currently under observation and he is not on any antihypertensive agents at this time. His orthostatic hypotension is likely secondary to chemotherapy.  Further medical management will be monitored by oncology.  Consults: Oncology  Significant Diagnostic Studies: radiology: CXR: Pneumothorax  Treatments: transfusion packed cells  Discharge  Exam: Blood pressure 102/61, pulse 79, temperature 97.9 F (36.6 C), temperature source Oral, resp. rate 24, height 6\' 2"  (1.88 m), weight 182 lb 1.6 oz (82.6 kg), SpO2 100.00%.  Physical Exam: Cardiovascular: RRR  Pulmonary: Slightly diminished at right apex and clear on left. No rales, wheezes, or rhonchi.  Abdomen: Soft, non tender, bowel sounds present.  Wounds: Dressing is clean and dry.    Disposition: 01-Home or Self Care   Medications at time of discharge:   Medication List         alfuzosin 10 MG 24 hr tablet  Commonly known as:  UROXATRAL  Take 10 mg by mouth daily with breakfast.     cycloSPORINE 0.05 % ophthalmic emulsion  Commonly known as:  RESTASIS  1 drop 2 (two) times daily.     feeding supplement (RESOURCE BREEZE) Liqd  Take 1 Container by mouth daily at 3 pm.     HYDROcodone-homatropine 5-1.5 MG/5ML syrup  Commonly known as:  HYCODAN  Take 5 mLs by mouth every 6 (six) hours as needed for cough.     multivitamin with minerals Tabs tablet  Take 1 tablet by mouth daily.     oxyCODONE-acetaminophen 5-325 MG per tablet  Commonly known as:  PERCOCET/ROXICET  Take 1 tablet by mouth every 6 (six) hours as needed for severe pain.     polyethylene glycol packet  Commonly known as:  MIRALAX / GLYCOLAX  Take 17 g by mouth daily.     prochlorperazine 5 MG tablet  Commonly known as:  COMPAZINE  Take 5 mg by mouth every 6 (six) hours as needed for nausea or vomiting.        Follow-up Information    Follow up with GERHARDT,EDWARD B, MD. (01/03/2014 at 2:30 PM to see Dr. Servando Snare. Please obtain a chest x-ray and [facility] 1 hour prior to this appointment. [facility] is located in the same office complex.)    Specialty:  Cardiothoracic Surgery   Contact information:   [address] [place] [address] [phone]       Follow up with [name]., MD. (Call for a follow up appointment)    Specialty:  Oncology   Contact information:   [address] [place] [address] [phone]       Signed: Nani Skillern PA-C 12/23/2013, 11:45 AM

## 2013-12-20 NOTE — Progress Notes (Signed)
Visited with pt, pt's wife, and pt's granddaughter. Wife expressed that the struggles with pt's health have been stressful. Pt seemed reticent to speak with chaplain, so chaplain stuck with friendly conversation, and then prayed with family.    12/20/13 1100  Clinical Encounter Type  Visited With Patient and family together  Visit Type Initial  Referral From Nurse  Consult/Referral To [place], [place], [place] 12/20/2013 11:17 AM

## 2013-12-20 NOTE — Discharge Instructions (Signed)
Pneumothorax A pneumothorax, commonly called a collapsed lung, is a condition in which air leaks from a lung and builds up in the space between the lung and the chest wall (pleural space). The air in a pneumothorax is trapped outside the lung and takes up space, preventing the lung from fully expanding. This is a condition that usually occurs suddenly. The buildup of air may be small or large. A small pneumothorax may go away on its own. When a pneumothorax is larger, it will often require medical treatment and hospitalization.  CAUSES  A pneumothorax can sometimes happen quickly with no apparent cause. People with underlying lung problems, particularly COPD or emphysema, are at higher risk of pneumothorax. However, pneumothorax can happen quickly even in people with no prior known lung problems. Trauma, surgery, medical procedures, or injury to the chest wall can also cause a pneumothorax. SIGNS AND SYMPTOMS  Sometimes a pneumothorax will have no symptoms. When symptoms are present, they can include:  Chest pain.  Shortness of breath.  Increased rate of breathing.  Bluish color to your lips or skin (cyanosis). DIAGNOSIS  Pneumothorax is usually diagnosed by a chest X-ray or chest CT scan. Your health care provider will also take a medical history and perform a physical exam to determine why you may have a pneumothorax. TREATMENT  A small pneumothorax may go away on its own without treatment. Extra oxygen can sometimes help a small pneumothorax go away more quickly. For a larger pneumothorax or a pneumothorax that is causing symptoms, a procedure is usually needed to drain the air.In some cases, the health care provider may drain the air using a needle. In other cases, a chest tube may be inserted into the pleural space. A chest tube is a small tube placed between the ribs and into the pleural space. This removes the extra air and allows the lung to expand back to its normal size. A large  pneumothorax will usually require a hospital stay. If there is ongoing air leakage into the pleural space, then the chest tube may need to remain in place for several days until the air leak has healed. In some cases, surgery may be needed.  HOME CARE INSTRUCTIONS   Only take over-the-counter or prescription medicines as directed by your health care provider.  If a cough or pain makes it difficult for you to sleep at night, try sleeping in a semi-upright position in a recliner or by using 2 or 3 pillows.  Rest and limit activity as directed by your health care provider.  If you had a chest tube and it was removed, ask your health care provider when it is okay to remove the dressing. Until your health care provider says you can remove the dressing, do not allow it to get wet.  Do not smoke. Smoking is a risk factor for pneumothorax.  Do not fly in an airplane or scuba dive until your health care provider says it is okay.  Follow up with your health care provider as directed. SEEK IMMEDIATE MEDICAL CARE IF:   You have increasing chest pain or shortness of breath.  You have a cough that is not controlled with suppressants.  You begin coughing up blood.  You have pain that is getting worse or is not controlled with medicines.  You cough up thick, discolored mucus (sputum) that is yellow to green in color.  You have redness, increasing pain, or discharge at the site where a chest tube had been in place (if  your pneumothorax was treated with a chest tube).  The site where your chest tube was located opens up.  You feel air coming out of the site where the chest tube was placed.  You have a fever or persistent symptoms for more than 2-3 days.  You have a fever and your symptoms suddenly get worse. MAKE SURE YOU:   Understand these instructions.  Will watch your condition.  Will get help right away if you are not doing well or get worse. Document Released: 03/14/2005 Document  Revised: 01/02/2013 Document Reviewed: 10/11/2012 [facility] Patient Information 2015 [place], [place]. This information is not intended to replace advice given to you by your health care provider. Make sure you discuss any questions you have with your health care provider.

## 2013-12-21 ENCOUNTER — Inpatient Hospital Stay (HOSPITAL_COMMUNITY): Payer: Medicare Other

## 2013-12-21 LAB — TYPE AND SCREEN
ABO/RH(D): O POS
Antibody Screen: NEGATIVE
Unit division: 0

## 2013-12-21 LAB — PREPARE RBC (CROSSMATCH)

## 2013-12-21 LAB — CBC
HEMATOCRIT: 24.5 % — AB (ref 39.0–52.0)
Hemoglobin: 8.5 g/dL — ABNORMAL LOW (ref 13.0–17.0)
MCH: 32.6 pg (ref 26.0–34.0)
MCHC: 34.7 g/dL (ref 30.0–36.0)
MCV: 93.9 fL (ref 78.0–100.0)
PLATELETS: 40 10*3/uL — AB (ref 150–400)
RBC: 2.61 MIL/uL — ABNORMAL LOW (ref 4.22–5.81)
RDW: 18.6 % — AB (ref 11.5–15.5)
WBC: 2.2 10*3/uL — ABNORMAL LOW (ref 4.0–10.5)

## 2013-12-21 NOTE — Progress Notes (Addendum)
      [address]       [place] [address]             [phone]            Subjective: Patient without complaints this am  Objective: Vital signs in last 24 hours: Temp:  [97.5 F (36.4 C)-98.3 F (36.8 C)] 98.1 F (36.7 C) (09/26 0800) Pulse Rate:  [72-90] 80 (09/26 0715) Cardiac Rhythm:  [-] Normal sinus rhythm (09/26 0800) Resp:  [13-28] 13 (09/26 0432) BP: (99-124)/(48-66) 103/59 mmHg (09/26 0715) SpO2:  [97 %-100 %] 97 % (09/26 0715)     Intake/Output from previous day: 09/25 0701 - 09/26 0700 In: 240 [P.O.:240] Out: 475 [Urine:475]   Physical Exam:  Cardiovascular: RRR Pulmonary: Slightly diminished at right apex and clear on left. No rales, wheezes, or rhonchi. Abdomen: Soft, non tender, bowel sounds present. Wounds: Dressing is clean and dry.  Chest Tube: to water seal, no air leak  Lab Results: CBC:  Recent Labs  12/20/13 0400 12/21/13 0400  WBC 1.6* 2.2*  HGB 7.2* 8.5*  HCT 21.2* 24.5*  PLT 44* 40*   BMET:   Recent Labs  12/18/13 1610 12/19/13 0405  NA 139 140  K 3.7 4.2  CL 100 104  CO2 26 26  GLUCOSE 111* 102*  BUN 15 14  CREATININE 0.88 0.87  CALCIUM 9.4 9.0    PT/INR:   Recent Labs  12/19/13 0405  LABPROT 13.9  INR 1.07   ABG:  INR: Will add last result for INR, ABG once components are confirmed Will add last 4 CBG results once components are confirmed  Assessment/Plan:  1. CV - SR. SBP slowly improving after transfusion 2.  Pulmonary - Chest tube removed yesterday. CXR shows small, stable right apical PTX, chronic scarring and volume loss on right, right hemidiaphragm elevated, left lung clear.. 3. Anemia-H and H increased to 8.5 and 24.5 after one unit PRBC yesterday 4. Thrombocytopenia-platelets further decreased to 40,000. Was supposed to receive chemo on Monday, but due to pancytopenia and hypotension on hold. 5. Will discuss disposition with Dr. Jon Billings  MPA-C 12/21/2013,9:24 AM    Chart reviewed, patient examined, agree with above. His CXR looks stable with small right apical hydropneumothorax. He can go home when he is walking around comfortably.

## 2013-12-22 MED ORDER — LACTULOSE 10 GM/15ML PO SOLN
10.0000 g | Freq: Once | ORAL | Status: AC
  Administered 2013-12-22: 10 g via ORAL
  Filled 2013-12-22: qty 15

## 2013-12-22 MED ORDER — POLYETHYLENE GLYCOL 3350 17 G PO PACK
17.0000 g | PACK | Freq: Every day | ORAL | Status: DC
  Administered 2013-12-23: 17 g via ORAL
  Filled 2013-12-22: qty 1

## 2013-12-22 NOTE — Progress Notes (Addendum)
      [address]       [place] [address]             [phone]            Subjective: Patient states has CT scheduled as [facility] in am. Only complaints are mild constipation (small stool) and occasional dry cough that "seems to get stuck" in his upper throat  Objective: Vital signs in last 24 hours: Temp:  [97.5 F (36.4 C)-98.5 F (36.9 C)] 98 F (36.7 C) (09/27 0737) Pulse Rate:  [69-80] 74 (09/27 0737) Cardiac Rhythm:  [-] Normal sinus rhythm (09/27 0318) Resp:  [13-19] 19 (09/27 0737) BP: (97-115)/(59-74) 107/62 mmHg (09/27 0737) SpO2:  [97 %-100 %] 100 % (09/27 0737)     Intake/Output from previous day: 09/26 0701 - 09/27 0700 In: 120 [P.O.:120] Out: 7096 [Urine:1275]   Physical Exam:  Cardiovascular: RRR Pulmonary: Slightly diminished at right apex and clear on left. No rales, wheezes, or rhonchi. Abdomen: Soft, non tender, bowel sounds present. Wounds: Dressing is clean and dry.    Lab Results: CBC:  Recent Labs  12/20/13 0400 12/21/13 0400  WBC 1.6* 2.2*  HGB 7.2* 8.5*  HCT 21.2* 24.5*  PLT 44* 40*   BMET:  No results found for this basename: NA, K, CL, CO2, GLUCOSE, BUN, CREATININE, CALCIUM,  in the last 72 hours  PT/INR:  No results found for this basename: LABPROT, INR,  in the last 72 hours ABG:  INR: Will add last result for INR, ABG once components are confirmed Will add last 4 CBG results once components are confirmed  Assessment/Plan:  1. CV - SR. SBP slowly improving after transfusion 2.  Pulmonary -  Check CXR in am. 3. Anemia-H and H increased to 8.5 and 24.5 after one unit PRBC yesterday 4. Thrombocytopenia-platelets further decreased to 40,000. Was supposed to receive chemo on Monday, but due to pancytopenia and hypotension on hold. 5. Ambulate tid 6. Small dose of lactulose for mild constipation. On Miralax, but wants something more. 7. He is scheduled to have a CT at [facility] in am. May be able to schedule  while here. Will discuss with Dr. Servando Snare 7. Possible discharge in am if ambulating more.  ZIMMERMAN,DONIELLE MPA-C 12/22/2013,9:01 AM    Chart reviewed, patient examined, agree with above.

## 2013-12-22 NOTE — Progress Notes (Signed)
Pt. Unable to ambulate due to orthostatic hypotension.

## 2013-12-23 ENCOUNTER — Inpatient Hospital Stay (HOSPITAL_COMMUNITY): Payer: Medicare Other

## 2013-12-23 ENCOUNTER — Encounter: Payer: Self-pay | Admitting: *Deleted

## 2013-12-23 ENCOUNTER — Telehealth: Payer: Self-pay | Admitting: *Deleted

## 2013-12-23 ENCOUNTER — Other Ambulatory Visit: Payer: Self-pay | Admitting: *Deleted

## 2013-12-23 ENCOUNTER — Ambulatory Visit (HOSPITAL_COMMUNITY): Payer: Medicare Other

## 2013-12-23 ENCOUNTER — Other Ambulatory Visit: Payer: Self-pay | Admitting: Internal Medicine

## 2013-12-23 DIAGNOSIS — C3410 Malignant neoplasm of upper lobe, unspecified bronchus or lung: Secondary | ICD-10-CM

## 2013-12-23 LAB — CBC
HCT: 26 % — ABNORMAL LOW (ref 39.0–52.0)
HEMOGLOBIN: 9 g/dL — AB (ref 13.0–17.0)
MCH: 33.7 pg (ref 26.0–34.0)
MCHC: 34.6 g/dL (ref 30.0–36.0)
MCV: 97.4 fL (ref 78.0–100.0)
Platelets: 34 10*3/uL — ABNORMAL LOW (ref 150–400)
RBC: 2.67 MIL/uL — ABNORMAL LOW (ref 4.22–5.81)
RDW: 19 % — ABNORMAL HIGH (ref 11.5–15.5)
WBC: 2.4 10*3/uL — ABNORMAL LOW (ref 4.0–10.5)

## 2013-12-23 MED ORDER — HEPARIN SOD (PORK) LOCK FLUSH 100 UNIT/ML IV SOLN
500.0000 [IU] | INTRAVENOUS | Status: AC | PRN
  Administered 2013-12-23: 500 [IU]

## 2013-12-23 MED ORDER — BOOST / RESOURCE BREEZE PO LIQD: 1.0000 | Freq: Every day | ORAL | Status: AC

## 2013-12-23 MED ORDER — SODIUM CHLORIDE 0.9 % IJ SOLN
10.0000 mL | INTRAMUSCULAR | Status: DC | PRN
  Administered 2013-12-23: 10 mL

## 2013-12-23 NOTE — [facility] Oncology Navigator Note (Unsigned)
PA from [facility] called.  Patient is in need of CT Chest rescheduled and is patient to receive chemotherapy on Tuesday.  I spoke with Dr. Julien Nordmann.  He stated no chemo on Tuesday and have him an appt to follow up next week.  I will arrange.

## 2013-12-23 NOTE — Telephone Encounter (Signed)
misake

## 2013-12-23 NOTE — Progress Notes (Signed)
      [address]       [place] [address]             [phone]            Subjective: Patient had a large bowel movement earlier this am.  Objective: Vital signs in last 24 hours: Temp:  [97.6 F (36.4 C)-98.7 F (37.1 C)] 98.4 F (36.9 C) (09/28 0310) Pulse Rate:  [66-96] 92 (09/28 0625) Cardiac Rhythm:  [-] Normal sinus rhythm (09/28 0310) Resp:  [14-24] 24 (09/28 0625) BP: (64-110)/(37-63) 83/50 mmHg (09/28 0625) SpO2:  [86 %-100 %] 100 % (09/28 0625)     Intake/Output from previous day: 09/27 0701 - 09/28 0700 In: 120 [P.O.:120] Out: 1751 [Urine:1750; Stool:1]   Physical Exam:  Cardiovascular: RRR Pulmonary: Slightly diminished at right apex and clear on left. No rales, wheezes, or rhonchi. Abdomen: Soft, non tender, bowel sounds present. Wounds: Dressing is clean and dry.    Lab Results: CBC:  Recent Labs  12/21/13 0400 12/23/13 0310  WBC 2.2* 2.4*  HGB 8.5* 9.0*  HCT 24.5* 26.0*  PLT 40* 34*   BMET:  No results found for this basename: NA, K, CL, CO2, GLUCOSE, BUN, CREATININE, CALCIUM,  in the last 72 hours  PT/INR:  No results found for this basename: LABPROT, INR,  in the last 72 hours ABG:  INR: Will add last result for INR, ABG once components are confirmed Will add last 4 CBG results once components are confirmed  Assessment/Plan:  1. CV - SR. BP labile again when sitting. Systolic BP 979 right now. 2.  Pulmonary-CXR shows persistent, small right apical pneumothorax. 3. Anemia-H and H increased to 8.5 and 24.5 after one unit PRBC yesterday 4. Thrombocytopenia-platelets further decreased to 34,000.  5. Ambulate tid 6. He is scheduled to have a CT at [facility] today. As discussed with Dr. Servando Snare, will cancel and have Hinton Dyer at [facility] reschedule. He is also scheduled to have chemotherapy on Tuesday. Will discuss with Dr. Inda Merlin. 7. Possible discharge later today.  Zareena Willis MPA-C 12/23/2013,8:24  AM    .

## 2013-12-23 NOTE — Progress Notes (Signed)
Notified IV team at 1248 to deaccess port-a-cath for pt to DC home, return call at 1303-will be by shortly.[facility] to resume. eICU and [facility] notified of DC. VSS. DC instructions given and questions answered. Pt educated on hypotension, needs to sit at bedside before attempting to stand and contact Dr. Irish Lack if having further issues. No CT scan this week per MD, chemo RN will call next week with plan/schedule. Can take any OTC medications for bowel movements/constipation. All belongings sent with patient and daughter home. Will continue to monitor until time of DC. Donielle PA contacted for order to resume [place], need face-to-face.

## 2013-12-23 NOTE — Progress Notes (Addendum)
I spoke with Terry Robles (thoracic cancer coordinator) earlier this morning. She is going to cancel the CT scheduled for today at [facility]. Also, chemotherapy is on hold until next week. Terry Robles will follow up with Dr. Curt Bears.

## 2013-12-23 NOTE — [facility] Oncology Navigator Note (Unsigned)
POF place for schedulers to schedule patient with APP per Dr. Julien Nordmann on 12/31/13 at 10:00.

## 2013-12-23 NOTE — Progress Notes (Signed)
12/23/13 0620  Vitals  BP ! 64/37 mmHg  MAP (mmHg) 43  BP Location Right arm  BP Method Automatic  Patient Position (if appropriate) Sitting  Pulse Rate 89  ECG Heart Rate 89  Resp 18  pt's BP dropped 101/60 down to 64/37 in BSC, 5 mins later recovered to 83/50 c/0 dizziness, pt requested to go back to be will continue to monitor.

## 2013-12-23 NOTE — Progress Notes (Signed)
Pt BP 98-33 Systolic while lying in the bed and drops to low 80 when sitting up in bed. Day shift RN made PA aware during dayshift, pt denies any dizziness currently cbc drawn and sent will continue to monitorl

## 2013-12-23 NOTE — Progress Notes (Signed)
Utilization review completed.  

## 2013-12-23 NOTE — [facility] Oncology Navigator Note (Unsigned)
Called to re-schedule CT Chest.  I did.  Scan on 12/27/13 at 12:45.  I will notify patient

## 2013-12-24 ENCOUNTER — Ambulatory Visit: Payer: Medicare Other | Admitting: Nurse Practitioner

## 2013-12-24 ENCOUNTER — Ambulatory Visit: Payer: Medicare Other

## 2013-12-24 ENCOUNTER — Other Ambulatory Visit: Payer: Medicare Other

## 2013-12-24 ENCOUNTER — Encounter: Payer: Medicare Other | Admitting: Vascular Surgery

## 2013-12-24 ENCOUNTER — Other Ambulatory Visit: Payer: Self-pay | Admitting: Medical Oncology

## 2013-12-24 ENCOUNTER — Other Ambulatory Visit (HOSPITAL_COMMUNITY): Payer: Medicare Other

## 2013-12-24 NOTE — Telephone Encounter (Signed)
Asking about f/u appt. I told him request sent to schedule appt. He also wants to know about taking lactulose regularly because it helped him have BM in hospital.

## 2013-12-26 ENCOUNTER — Telehealth: Payer: Self-pay | Admitting: Medical Oncology

## 2013-12-26 ENCOUNTER — Ambulatory Visit: Payer: Medicare Other | Admitting: Cardiothoracic Surgery

## 2013-12-26 NOTE — Telephone Encounter (Signed)
Per Dr Julien Nordmann I told pt if he had constipation he wants him to try 1/2 bottle of mag citrate first and let us know his response . IF it does not work he will consider lactulose.

## 2013-12-27 ENCOUNTER — Encounter (HOSPITAL_COMMUNITY): Payer: Self-pay

## 2013-12-27 ENCOUNTER — Ambulatory Visit (HOSPITAL_COMMUNITY)
Admission: RE | Admit: 2013-12-27 | Discharge: 2013-12-27 | Disposition: A | Discharge status: Home / Self Care | Payer: Medicare Other | Source: Ambulatory Visit | Attending: Internal Medicine | Admitting: Internal Medicine

## 2013-12-27 ENCOUNTER — Ambulatory Visit (HOSPITAL_COMMUNITY): Payer: Medicare Other

## 2013-12-27 ENCOUNTER — Other Ambulatory Visit: Payer: Self-pay | Admitting: Medical Oncology

## 2013-12-27 DIAGNOSIS — C3411 Malignant neoplasm of upper lobe, right bronchus or lung: Secondary | ICD-10-CM | POA: Diagnosis present

## 2013-12-27 DIAGNOSIS — Z23 Encounter for immunization: Secondary | ICD-10-CM

## 2013-12-27 DIAGNOSIS — C3410 Malignant neoplasm of upper lobe, unspecified bronchus or lung: Secondary | ICD-10-CM

## 2013-12-27 MED ORDER — IOHEXOL 300 MG/ML  SOLN
100.0000 mL | Freq: Once | INTRAMUSCULAR | Status: AC | PRN
  Administered 2013-12-27: 100 mL via INTRAVENOUS

## 2013-12-31 ENCOUNTER — Telehealth: Payer: Self-pay | Admitting: Nurse Practitioner

## 2013-12-31 ENCOUNTER — Ambulatory Visit (HOSPITAL_BASED_OUTPATIENT_CLINIC_OR_DEPARTMENT_OTHER): Payer: Medicare Other | Admitting: Nurse Practitioner

## 2013-12-31 ENCOUNTER — Ambulatory Visit (HOSPITAL_BASED_OUTPATIENT_CLINIC_OR_DEPARTMENT_OTHER): Payer: Medicare Other

## 2013-12-31 ENCOUNTER — Ambulatory Visit: Payer: Medicare Other | Admitting: Nurse Practitioner

## 2013-12-31 ENCOUNTER — Ambulatory Visit: Payer: Medicare Other

## 2013-12-31 ENCOUNTER — Other Ambulatory Visit (HOSPITAL_BASED_OUTPATIENT_CLINIC_OR_DEPARTMENT_OTHER): Payer: Medicare Other

## 2013-12-31 VITALS — BP 90/56 | HR 100 | Temp 97.6°F | Resp 16 | Wt 186.0 lb

## 2013-12-31 DIAGNOSIS — I959 Hypotension, unspecified: Secondary | ICD-10-CM

## 2013-12-31 DIAGNOSIS — C3492 Malignant neoplasm of unspecified part of left bronchus or lung: Secondary | ICD-10-CM

## 2013-12-31 DIAGNOSIS — R530 Neoplastic (malignant) related fatigue: Secondary | ICD-10-CM

## 2013-12-31 DIAGNOSIS — C3411 Malignant neoplasm of upper lobe, right bronchus or lung: Secondary | ICD-10-CM

## 2013-12-31 DIAGNOSIS — D696 Thrombocytopenia, unspecified: Secondary | ICD-10-CM

## 2013-12-31 DIAGNOSIS — E8809 Other disorders of plasma-protein metabolism, not elsewhere classified: Secondary | ICD-10-CM

## 2013-12-31 DIAGNOSIS — D6959 Other secondary thrombocytopenia: Secondary | ICD-10-CM

## 2013-12-31 DIAGNOSIS — Z95828 Presence of other vascular implants and grafts: Secondary | ICD-10-CM

## 2013-12-31 DIAGNOSIS — R5382 Chronic fatigue, unspecified: Secondary | ICD-10-CM

## 2013-12-31 LAB — COMPREHENSIVE METABOLIC PANEL (CC13)
ALT: 18 U/L (ref 0–55)
AST: 17 U/L (ref 5–34)
Albumin: 3 g/dL — ABNORMAL LOW (ref 3.5–5.0)
Alkaline Phosphatase: 122 U/L (ref 40–150)
Anion Gap: 7 mEq/L (ref 3–11)
BUN: 14.2 mg/dL (ref 7.0–26.0)
CALCIUM: 9.8 mg/dL (ref 8.4–10.4)
CHLORIDE: 107 meq/L (ref 98–109)
CO2: 26 meq/L (ref 22–29)
Creatinine: 0.8 mg/dL (ref 0.7–1.3)
Glucose: 136 mg/dl (ref 70–140)
POTASSIUM: 3.8 meq/L (ref 3.5–5.1)
SODIUM: 140 meq/L (ref 136–145)
TOTAL PROTEIN: 6.5 g/dL (ref 6.4–8.3)
Total Bilirubin: 0.38 mg/dL (ref 0.20–1.20)

## 2013-12-31 LAB — CBC WITH DIFFERENTIAL/PLATELET
BASO%: 0.3 % (ref 0.0–2.0)
Basophils Absolute: 0 10*3/uL (ref 0.0–0.1)
EOS ABS: 0 10*3/uL (ref 0.0–0.5)
EOS%: 1 % (ref 0.0–7.0)
HCT: 28.7 % — ABNORMAL LOW (ref 38.4–49.9)
HGB: 9.6 g/dL — ABNORMAL LOW (ref 13.0–17.1)
LYMPH%: 17.5 % (ref 14.0–49.0)
MCH: 33.8 pg — ABNORMAL HIGH (ref 27.2–33.4)
MCHC: 33.4 g/dL (ref 32.0–36.0)
MCV: 101.1 fL — ABNORMAL HIGH (ref 79.3–98.0)
MONO#: 0.3 10*3/uL (ref 0.1–0.9)
MONO%: 6.5 % (ref 0.0–14.0)
NEUT%: 74.7 % (ref 39.0–75.0)
NEUTROS ABS: 3 10*3/uL (ref 1.5–6.5)
Platelets: 68 10*3/uL — ABNORMAL LOW (ref 140–400)
RBC: 2.84 10*6/uL — AB (ref 4.20–5.82)
RDW: 20.7 % — ABNORMAL HIGH (ref 11.0–14.6)
WBC: 4 10*3/uL (ref 4.0–10.3)
lymph#: 0.7 10*3/uL — ABNORMAL LOW (ref 0.9–3.3)

## 2013-12-31 MED ORDER — SODIUM CHLORIDE 0.9 % IJ SOLN
10.0000 mL | INTRAMUSCULAR | Status: DC | PRN
  Administered 2013-12-31: 10 mL via INTRAVENOUS
  Filled 2013-12-31: qty 10

## 2013-12-31 MED ORDER — HEPARIN SOD (PORK) LOCK FLUSH 100 UNIT/ML IV SOLN
500.0000 [IU] | Freq: Once | INTRAVENOUS | Status: AC
  Administered 2013-12-31: 500 [IU] via INTRAVENOUS
  Filled 2013-12-31: qty 5

## 2013-12-31 NOTE — Progress Notes (Signed)
Patient is accessed and PAC flushed with saline/heparin.  Pt. Will not receive chemotherapy today.  PAC needle removed.  Pt escorted on his walker to entrance and seated on bench while he is waiting for his ride.

## 2013-12-31 NOTE — Patient Instructions (Signed)

## 2013-12-31 NOTE — Telephone Encounter (Signed)
per Jan to chge from sytm to est-done

## 2013-12-31 NOTE — Progress Notes (Signed)
[place]   Chief Complaint  Patient presents with  . Follow-up    HPI: Terry Robles 73 y.o. male diagnosed with lung cancer.  Patient is status post carboplatin/Abraxane chemotherapy regimen.  The plan is for the patient to initiate Nivolumab immunotherapy next week.  Patient last received his carboplatin/Abraxane chemotherapy regimen on 12/09/2013.  He obtained a restaging scan just last week; which did unfortunately show progression of his disease.  Patient continues to complain of some chronic and slightly increased fatigue.  He states that he does continue to keep fairly well.  He denies any recent fevers or chills.  He denies any nausea, vomiting, diarrhea, or constipation.  She'll states he has plans to followup with his cardiologist this coming Friday, 01/03/2014.   HPI     ROS  Past Medical History  Diagnosis Date  . Hypercholesteremia 05/14/2011  . BPH (benign prostatic hyperplasia) 05/14/2011    takes Uroxatral daily  . GERD (gastroesophageal reflux disease)   . 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 Cancer     squamous cell carcinoma right superior sulcus  . Lung cancer     right lung  . Shortness of breath     weather related  . Pneumonia 1965  . Headache(784.0)     occasionally  . Joint pain   . Maintenance chemotherapy   . Urinary frequency   . History of shingles   . Anemia   . Acquired thrombocytopenia     Past Surgical History  Procedure Laterality Date  . Other surgical history      benign tumor removed from left leg several years ago  . Other surgical history      had melanoma/squamous cell removed previously per patient  . Tumor excision      Left knee  . Colonoscopy w/ biopsies and polypectomy    . Video bronchoscopy N/A 07/30/2013    Procedure: VIDEO BRONCHOSCOPY;  Surgeon: Grace Isaac, MD;  Location: [facility];  Service: Thoracic;  Laterality: N/A;  . Mediastinoscopy N/A 07/30/2013    Procedure:  MEDIASTINOSCOPY;  Surgeon: Grace Isaac, MD;  Location: [facility];  Service: Thoracic;  Laterality: N/A;  . Video assisted thoracoscopy (vats)/wedge resection Right 07/30/2013    Procedure: VIDEO ASSISTED THORACOSCOPY;  Surgeon: Grace Isaac, MD;  Location: [facility];  Service: Thoracic;  Laterality: Right;  . Thoracotomy  07/30/2013    Procedure: THORACOTOMY FOR CHEST WALL RESECTION, RIGHT UPPER LOBECTOMY;  Surgeon: Grace Isaac, MD;  Location: [facility];  Service: Thoracic;;  . Adenoidectomy  as a child  . Tonsillectomy    . 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     has No Known Allergies.    Medication List       This list is accurate as of: 12/31/13 11:59 PM.  Always use your most recent med list.               alfuzosin 10 MG 24  hr tablet  Commonly known as:  UROXATRAL  Take 10 mg by mouth daily with breakfast.     cycloSPORINE 0.05 % ophthalmic emulsion  Commonly known as:  RESTASIS  1 drop 2 (two) times daily.     feeding supplement (RESOURCE BREEZE) Liqd  Take 1 Container by mouth daily at 3 pm.     HYDROcodone-homatropine 5-1.5 MG/5ML syrup  Commonly known as:  HYCODAN  Take 5 mLs by mouth every 6 (six) hours as needed for cough.     multivitamin with minerals Tabs tablet  Take 1 tablet by mouth daily.     oxyCODONE-acetaminophen 5-325 MG per tablet  Commonly known as:  PERCOCET/ROXICET  Take 1 tablet by mouth every 6  (six) hours as needed for severe pain.     polyethylene glycol packet  Commonly known as:  MIRALAX / GLYCOLAX  Take 17 g by mouth daily.     prochlorperazine 5 MG tablet  Commonly known as:  COMPAZINE  Take 5 mg by mouth every 6 (six) hours as needed for nausea or vomiting.         PHYSICAL EXAMINATION  Vitals: BP 9/56, HR 100, temp 97.6 sat 100  Physical Exam  Nursing note and vitals reviewed. Constitutional: He is oriented to person, place, and time. He appears malnourished and dehydrated. He appears unhealthy.  HENT:  Head: Normocephalic.  Mouth/Throat: Oropharynx is clear and moist. No oropharyngeal exudate.  Eyes: Conjunctivae and EOM are normal. Pupils are equal, round, and reactive to light. No scleral icterus.  Neck: Normal range of motion. Neck supple. No JVD present. No tracheal deviation present. No thyromegaly present.  Cardiovascular: Normal rate, regular rhythm, normal heart sounds and intact distal pulses.   Pulmonary/Chest: Effort normal and breath sounds normal. No respiratory distress. He has no wheezes.  Abdominal: Soft. Bowel sounds are normal. He exhibits no distension. There is no tenderness. There is no rebound.  Musculoskeletal: Normal range of motion. He exhibits no edema and no tenderness.  Lymphadenopathy:    He has no cervical adenopathy.  Neurological: He is alert and oriented to person, place, and time. Gait normal.  Skin: Skin is warm and dry. No rash noted. No erythema.  Psychiatric: Affect normal.    LABORATORY DATA:. CBC  Lab Results  Component Value Date   WBC 4.0 12/31/2013   RBC 2.84* 12/31/2013   HGB 9.6* 12/31/2013   HCT 28.7* 12/31/2013   PLT 68* 12/31/2013   MCV 101.1* 12/31/2013   MCH 33.8* 12/31/2013   MCHC 33.4 12/31/2013   RDW 20.7* 12/31/2013   LYMPHSABS 0.7* 12/31/2013   MONOABS 0.3 12/31/2013   EOSABS 0.0 12/31/2013   BASOSABS 0.0 12/31/2013     CMET  Lab Results  Component Value Date   NA 140 12/31/2013   K 3.8 12/31/2013     CL 104 12/19/2013   CO2 26 12/31/2013   GLUCOSE 136 12/31/2013   BUN 14.2 12/31/2013   CREATININE 0.8 12/31/2013   CALCIUM 9.8 12/31/2013   PROT 6.5 12/31/2013   ALBUMIN 3.0* 12/31/2013   AST 17 12/31/2013   ALT 18 12/31/2013   ALKPHOS 122 12/31/2013   BILITOT 0.38 12/31/2013   GFRNONAA 84* 12/19/2013   GFRAA >90 12/19/2013    RADIOGRAPHIC STUDIES:       CT Chest W Contrast Status: Final result         PACS Images    Show images for CT Chest W Contrast  Study Result    CLINICAL DATA: Subsequent encounter for right-sided Lung cancer.  Squamous cell carcinoma of the right upper lobe.  EXAM:  CT CHEST, ABDOMEN, AND PELVIS WITH CONTRAST  TECHNIQUE:  Multidetector CT imaging of the chest, abdomen and pelvis was  performed following the standard protocol during bolus  administration of intravenous contrast.  CONTRAST: 171mL OMNIPAQUE IOHEXOL 300 MG/ML SOLN  COMPARISON: PET-CT from 10/18/2013. Chest CT from 10/11/2013.  FINDINGS:  CT CHEST FINDINGS  Soft tissue / Mediastinum: The tip of the left-sided Port-A-Cath is  positioned at the SVC/ RA junction. There is no axillary  lymphadenopathy. 10 mm prevascular short axis lymph node (image 7  series 2) is new in the interval. 10 mm short axis precarinal lymph  node was 5 mm previously. 14 mm short axis subcarinal lymph node on  the previous study has increased in size to 19 mm today. No left  hilar lymphadenopathy. A amorphous soft tissue in the right hilum is  likely related to post treatment change.  3.1 cm posterior lateral left chest wall lesion on the previous  study is now 3.2 cm in the same dimension however, when measuring in  long axis, this same lesion has increased in size from 3.7 cm  previously to 5.8 cm currently.  Tiny chest wall nodule seen on image 42 series 2 today has not  increased in size in the interval, but was hypermetabolic on the  previous PET-CT.  Lungs / Pleura: Tiny air-fluid level in the  right apex is new in the  interval. Airspace opacity in the posterior right upper lobe is  stable and may represent postradiation fibrosis. This is underlying  the previous on bloc resection.  Lungs are diffusely emphysematous.  Tiny posterior right pleural effusion has has decreased in the  interval.  Bones: Bone windows reveal no worrisome lytic or sclerotic osseous  lesions.  CT ABDOMEN AND PELVIS FINDINGS  Hepatobiliary: No focal abnormality within the liver parenchyma.  Multiple tiny stones are seen within the gallbladder. No  intrahepatic or extrahepatic biliary dilation.  Pancreas: No focal mass lesion. No dilatation of the main duct. No  intraparenchymal cyst. No peripancreatic edema.  Spleen: No splenomegaly. No focal mass lesion.  Adrenals/Urinary Tract: No adrenal nodule or mass. Kidneys are  unremarkable. No ureteral dilatation. Urinary bladder has normal CT  imaging features.  Stomach/Bowel: Stomach is nondistended. No gastric wall thickening.  No evidence of outlet obstruction. Duodenum is normally positioned  as is the ligament of Treitz. No small bowel wall thickening. No  small bowel dilatation. Terminal ileum is normal. Appendix measures  up to 11 mm in diameter which is technically dilated, but it was 9  mm previously. The lumen is filled with contrast, debris, and air.  No evidence for wall thickening or periappendiceal  edema/inflammation to suggest acute appendicitis. Mild diverticular  change seen in the sigmoid colon without diverticulitis.  Vascular/Lymphatic: Atherosclerotic calcification is noted in the  wall of the abdominal aorta without aneurysm. No gastrohepatic or  hepatoduodenal ligament lymphadenopathy. No retroperitoneal  lymphadenopathy. No pelvic sidewall lymphadenopathy.  Reproductive: Prostate gland is enlarged.  Other: Left inguinal hernia contains only fat. No intraperitoneal  free fluid.  Musculoskeletal: Bone windows reveal no worrisome  lytic or sclerotic  osseous lesions.  IMPRESSION:  Interval progression of mediastinal lymphadenopathy compatible with  worsening metastatic disease. Dominant left-sided chest wall lesion  also shows clear interval progression.  Slight interval decrease in the small right pleural effusion.  No  evidence for metastatic disease in the abdomen or pelvis.  Electronically Signed  By: Misty Stanley M.D.  On: 12/27/2013 14:51      ASSESSMENT/PLAN:    Malignant neoplasm of right upper lobe of lung  Assessment & Plan Patient received his last carboplatin/Abraxane chemotherapy on 12/09/2013.  He obtained a restaging CT last week; which did unfortunately show progression of his disease.  Will discontinue current carboplatin/Abraxane chemotherapy regimen; and will initiate Nivolumab immunotherapy as soon as preauthorization is completed.  Patient will receive this new regimen on an every two-week cycle.  Patient will hopefully be able to initiate his new regimen on Tuesday, 01/07/2014.  Also, patient was advised to hold any steroid use whatsoever once he initiates his new Nivolumab immunotherapy.   Fatigue  Assessment & Plan Chemotherapy-induced fatigue does appear slightly increased at today.  Patient was encouraged to remain as active as possible.   Hypoalbuminemia  Assessment & Plan Albumin also is decreased.  Advised patient to push protein in his diet is much as possible; in the form of protein shakes, protein bars, or increasing to meat in his diet.   Thrombocytopenia  Assessment & Plan Could account actually improved from 34 up to 68.  Patient denies any worsening issues with easy bleeding or bruising.  Hopefully, I patient account will improve since he is off of chemotherapy.   Hypotension  Assessment & Plan Blood pressure scratch that patient's blood pressure typically runs fairly low.  However, patient's blood pressure was down to 90/56 today when he arrived to the [facility].  Patient confirmed that he is not taking any antihypertensive medications whatsoever.  Recommended to patient that he received IV fluid rehydration today since he is most likely slightly dehydrated.  Patient refused IV fluid rehydration; and stated he would recheck his blood pressure one to right:.  Advised patient to let us know if his blood pressure remained low at home.  Patient stated understanding of all instructions.  Also, I patient did confirm that he is seeing Dr. Servando Snare cardiologist this coming Friday, 01/03/2014; and he can followup of his blood pressure again as well.    Patient stated understanding of all instructions; and was in agreement with this plan of care. The patient knows to call the clinic with any problems, questions or concerns.   This was a shared visit with Dr. Julien Nordmann today.  Total time spent with patient was 25 minutes;  with greater than 75 percent of that time spent in face to face counseling regarding his symptoms, review of his scan results, and coordination of care and follow up.  Disclaimer: This note was dictated with voice recognition software. Similar sounding words can inadvertently be transcribed and may not be corrected upon review.   Drue Second, NP 01/02/2014   ADDENDUM: Hematology/Oncology Attending: I had a face to face encounter with the patient. I recommended his care plan. This is a very pleasant 73 years old white male with metastatic non-small cell lung cancer, adenocarcinoma recently treated with 3 cycles of systemic chemotherapy with carboplatin and Abraxane but unfortunately has evidence for disease progression on his recent imaging studies. I had a lengthy discussion with the patient today about his current disease stage, prognosis and treatment options. I discussed with the patient treatment with immunotherapy with Nivolumab 3 mg/KG every 2 weeks. He was also given him the option of treatment with either chemotherapy with single  agent gemcitabine or Navelbine. He is interested on the immunotherapy and I discussed with him  the adverse effect of this treatment including but not limited to immune mediated a skin rash, diarrhea, hepatitis, nephritis or endocrine dysfunction. He would like to proceed with treatment as planned. He is expected to start the first cycle of this treatment next week. He was advised to call immediately if he has any concerning symptoms in the interval.  Disclaimer: This note was dictated with voice recognition software. Similar sounding words can inadvertently be transcribed and may be missed upon review. [name]., MD 01/04/2014

## 2014-01-01 ENCOUNTER — Other Ambulatory Visit: Payer: Self-pay | Admitting: Cardiothoracic Surgery

## 2014-01-01 DIAGNOSIS — J939 Pneumothorax, unspecified: Secondary | ICD-10-CM

## 2014-01-01 NOTE — Telephone Encounter (Signed)
Lft msg for pt confirming labs/ov per 10/06 POF, mailed out sch..... KJ

## 2014-01-02 ENCOUNTER — Encounter: Payer: Self-pay | Admitting: Nurse Practitioner

## 2014-01-02 ENCOUNTER — Telehealth: Payer: Self-pay | Admitting: *Deleted

## 2014-01-02 DIAGNOSIS — E8809 Other disorders of plasma-protein metabolism, not elsewhere classified: Secondary | ICD-10-CM | POA: Insufficient documentation

## 2014-01-02 DIAGNOSIS — D696 Thrombocytopenia, unspecified: Secondary | ICD-10-CM | POA: Insufficient documentation

## 2014-01-02 DIAGNOSIS — I959 Hypotension, unspecified: Secondary | ICD-10-CM | POA: Insufficient documentation

## 2014-01-02 DIAGNOSIS — R5383 Other fatigue: Secondary | ICD-10-CM | POA: Insufficient documentation

## 2014-01-02 NOTE — Assessment & Plan Note (Signed)
Chemotherapy-induced fatigue does appear slightly increased at today.  Patient was encouraged to remain as active as possible.

## 2014-01-02 NOTE — Assessment & Plan Note (Addendum)
Patient received his last carboplatin/Abraxane chemotherapy on 12/09/2013.  He obtained a restaging CT last week; which did unfortunately show progression of his disease.  Will discontinue current carboplatin/Abraxane chemotherapy regimen; and will initiate Nivolumab immunotherapy as soon as preauthorization is completed.  Patient will receive this new regimen on an every two-week cycle.  Patient will hopefully be able to initiate his new regimen on Tuesday, 01/07/2014.  Also, patient was advised to hold any steroid use whatsoever once he initiates his new Nivolumab immunotherapy.

## 2014-01-02 NOTE — Telephone Encounter (Signed)
Per staff message and POF I have scheduled appts. Advised scheduler of appts. JMW  

## 2014-01-02 NOTE — Assessment & Plan Note (Signed)
Could account actually improved from 34 up to 68.  Patient denies any worsening issues with easy bleeding or bruising.  Hopefully, I patient account will improve since he is off of chemotherapy.

## 2014-01-02 NOTE — Assessment & Plan Note (Signed)
Blood pressure scratch that patient's blood pressure typically runs fairly low.  However, patient's blood pressure was down to 90/56 today when he arrived to the [facility].  Patient confirmed that he is not taking any antihypertensive medications whatsoever.  Recommended to patient that he received IV fluid rehydration today since he is most likely slightly dehydrated.  Patient refused IV fluid rehydration; and stated he would recheck his blood pressure one to right:.  Advised patient to let us know if his blood pressure remained low at home.  Patient stated understanding of all instructions.  Also, I patient did confirm that he is seeing Dr. Servando Snare cardiologist this coming Friday, 01/03/2014; and he can followup of his blood pressure again as well.

## 2014-01-02 NOTE — Assessment & Plan Note (Signed)
Albumin also is decreased.  Advised patient to push protein in his diet is much as possible; in the form of protein shakes, protein bars, or increasing to meat in his diet.

## 2014-01-03 ENCOUNTER — Ambulatory Visit
Admission: RE | Admit: 2014-01-03 | Discharge: 2014-01-03 | Disposition: A | Discharge status: Home / Self Care | Payer: Medicare Other | Source: Ambulatory Visit | Attending: Cardiothoracic Surgery | Admitting: Cardiothoracic Surgery

## 2014-01-03 ENCOUNTER — Ambulatory Visit: Payer: Medicare Other | Admitting: Cardiothoracic Surgery

## 2014-01-03 ENCOUNTER — Ambulatory Visit (INDEPENDENT_AMBULATORY_CARE_PROVIDER_SITE_OTHER): Payer: Medicare Other | Admitting: Cardiothoracic Surgery

## 2014-01-03 ENCOUNTER — Encounter: Payer: Self-pay | Admitting: Cardiothoracic Surgery

## 2014-01-03 VITALS — BP 94/63 | HR 90 | Resp 20 | Ht 74.0 in | Wt 186.0 lb

## 2014-01-03 DIAGNOSIS — C3490 Malignant neoplasm of unspecified part of unspecified bronchus or lung: Secondary | ICD-10-CM

## 2014-01-03 DIAGNOSIS — C799 Secondary malignant neoplasm of unspecified site: Secondary | ICD-10-CM

## 2014-01-03 DIAGNOSIS — J939 Pneumothorax, unspecified: Secondary | ICD-10-CM

## 2014-01-03 DIAGNOSIS — Z8709 Personal history of other diseases of the respiratory system: Secondary | ICD-10-CM

## 2014-01-05 NOTE — Progress Notes (Signed)
Patient visit type changed from [place]" to ESTABLISHED PATIENT VISIT. SEE OTHER NOTE PLEASE.

## 2014-01-06 NOTE — Progress Notes (Signed)
[address]       [place] [address]             [phone]      Gerrad K Cartier [facility] Medical Record #[id number] Date of Birth: [birth year]  Referring: Curt Bears, MD Primary Care: [name], MD  Chief Complaint:   POST OP FOLLOW UP 07/30/2013  OPERATIVE REPORT  PREOPERATIVE DIAGNOSIS: Non-small cell lung cancer, right upper lobe  with chest wall invasion.  POSTOPERATIVE DIAGNOSIS: Non-small cell lung cancer, right upper lobe  with chest wall invasion.  SURGICAL PROCEDURE: Bronchoscopy, mediastinoscopy, right posterior  lateral thoracotomy with right upper lobectomy, lymph node dissection,  and posterior approach to en bloc chest wall resection, ribs 2, 3, and  4.  History of Present Illness:     Patient returns to the office stay in followup after his right upper lobectomy lymph node dissection on block resection of the upper right chest wall and recent admission for rPTX on the right treated with chest tube. Currently not on chemo, feels better then two weeks ago.    Past Medical History  Diagnosis Date  . Hypercholesteremia 05/14/2011  . BPH (benign prostatic hyperplasia) 05/14/2011    takes Uroxatral daily  . GERD (gastroesophageal reflux disease)   . 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 Cancer     squamous cell carcinoma right superior sulcus  . Lung cancer     right lung  . Shortness of breath     weather related  . Pneumonia 1965  . Headache(784.0)     occasionally  . Joint pain   . Maintenance chemotherapy   . Urinary frequency   . History of shingles   . Anemia   . Acquired thrombocytopenia      History  Smoking status  . Former Smoker -- 1.00 packs/day for 42 years  . Types: Cigarettes  Smokeless tobacco  . Never Used    Comment: quit smoking almost 77yrs ago    History  Alcohol Use  . Yes    Comment: occasionally beer     No Known Allergies  Current Outpatient Prescriptions    Medication Sig Dispense Refill  . alfuzosin (UROXATRAL) 10 MG 24 hr tablet Take 10 mg by mouth daily with breakfast.      . cycloSPORINE (RESTASIS) 0.05 % ophthalmic emulsion 1 drop 2 (two) times daily.      . feeding supplement, RESOURCE BREEZE, (RESOURCE BREEZE) LIQD Take 1 Container by mouth daily at 3 pm.    0  . HYDROcodone-homatropine (HYCODAN) 5-1.5 MG/5ML syrup Take 5 mLs by mouth every 6 (six) hours as needed for cough.  240 mL  0  . Multiple Vitamin (MULITIVITAMIN WITH MINERALS) TABS Take 1 tablet by mouth daily.      Marland Kitchen oxyCODONE-acetaminophen (PERCOCET/ROXICET) 5-325 MG per tablet Take 1 tablet by mouth every 6 (six) hours as needed for severe pain.  60 tablet  0  . polyethylene glycol (MIRALAX / GLYCOLAX) packet Take 17 g by mouth daily.      . prochlorperazine (COMPAZINE) 5 MG tablet Take 5 mg by mouth every 6 (six) hours as needed for nausea or vomiting.       No current facility-administered medications for this visit.       Physical Exam: BP 94/63  Pulse 90  Resp 20  Ht 6\' 2"  (1.88 m)  Wt 186 lb (84.369 kg)  BMI 23.87 kg/m2  SpO2 93%  General appearance: alert and cooperative Neurologic: intact Heart: regular rate and rhythm, S1, S2 normal, no murmur, click, rub or gallop Lungs: clear to auscultation bilaterally Abdomen: soft, non-tender; bowel sounds normal; no masses,  no organomegaly Extremities: extremities normal, atraumatic, no cyanosis or edema and Homans sign is negative, no sign of DVT Wound: His right thoracotomy incision is well-healed, I do not appreciate cervical supraclavicular or axillary adenopathy,  He has no palpable point tenderness around the left chest wall in the area of question on chest x-ray   Diagnostic Studies & Laboratory data:     Recent Radiology Findings: Dg Chest 2 View  01/03/2014   CLINICAL DATA:  Followup pneumothorax.  Lung cancer  EXAM: CHEST  2 VIEW  COMPARISON:  CT chest 12/27/2013, CXR 12/23/2013  FINDINGS: Postop  resection right apex. There is right apical pleural thickening as well as rib resection. No pneumothorax seen on the chest x-ray. Mild right pleural thickening or fluid in the base.  Metastatic disease to the left seventh rib with soft tissue mass and bony destruction, stable.  Negative for heart failure or pneumonia. Port-A-Cath tip remains of the cavoatrial junction.  IMPRESSION: No pneumothorax identified  Metastatic disease left seventh rib is stable.   Electronically Signed   By: Franchot Gallo M.D.   On: 01/03/2014 15:31    Ct Chest W Contrast  12/27/2013   CLINICAL DATA:  Subsequent encounter for right-sided Lung cancer. Squamous cell carcinoma of the right upper lobe.  EXAM: CT CHEST, ABDOMEN, AND PELVIS WITH CONTRAST  TECHNIQUE: Multidetector CT imaging of the chest, abdomen and pelvis was performed following the standard protocol during bolus administration of intravenous contrast.  CONTRAST:  154mL OMNIPAQUE IOHEXOL 300 MG/ML  SOLN  COMPARISON:  PET-CT from 10/18/2013.  Chest CT from 10/11/2013.  FINDINGS: CT CHEST FINDINGS  Soft tissue / Mediastinum: The tip of the left-sided Port-A-Cath is positioned at the SVC/ RA junction. There is no axillary lymphadenopathy. 10 mm prevascular short axis lymph node (image 7 series 2) is new in the interval. 10 mm short axis precarinal lymph node was 5 mm previously. 14 mm short axis subcarinal lymph node on the previous study has increased in size to 19 mm today. No left hilar lymphadenopathy. A amorphous soft tissue in the right hilum is likely related to post treatment change.  3.1 cm posterior lateral left chest wall lesion on the previous study is now 3.2 cm in the same dimension however, when measuring in long axis, this same lesion has increased in size from 3.7 cm previously to 5.8 cm currently.  Tiny chest wall nodule seen on image 42 series 2 today has not increased in size in the interval, but was hypermetabolic on the previous PET-CT.  Lungs / Pleura:  Tiny air-fluid level in the right apex is new in the interval. Airspace opacity in the posterior right upper lobe is stable and may represent postradiation fibrosis. This is underlying the previous on bloc resection.  Lungs are diffusely emphysematous.  Tiny posterior right pleural effusion has has decreased in the interval.  Bones: Bone windows reveal no worrisome lytic or sclerotic osseous lesions.  CT ABDOMEN AND PELVIS FINDINGS  Hepatobiliary: No focal abnormality within the liver parenchyma. Multiple tiny stones are seen within the gallbladder. No intrahepatic or extrahepatic biliary dilation.  Pancreas: No focal mass lesion. No dilatation of the main duct. No intraparenchymal cyst. No peripancreatic edema.  Spleen: No splenomegaly. No focal mass lesion.  Adrenals/Urinary Tract:  No adrenal nodule or mass. Kidneys are unremarkable. No ureteral dilatation. Urinary bladder has normal CT imaging features.  Stomach/Bowel: Stomach is nondistended. No gastric wall thickening. No evidence of outlet obstruction. Duodenum is normally positioned as is the ligament of Treitz. No small bowel wall thickening. No small bowel dilatation. Terminal ileum is normal. Appendix measures up to 11 mm in diameter which is technically dilated, but it was 9 mm previously. The lumen is filled with contrast, debris, and air. No evidence for wall thickening or periappendiceal edema/inflammation to suggest acute appendicitis. Mild diverticular change seen in the sigmoid colon without diverticulitis.  Vascular/Lymphatic: Atherosclerotic calcification is noted in the wall of the abdominal aorta without aneurysm. No gastrohepatic or hepatoduodenal ligament lymphadenopathy. No retroperitoneal lymphadenopathy. No pelvic sidewall lymphadenopathy.  Reproductive: Prostate gland is enlarged.  Other: Left inguinal hernia contains only fat. No intraperitoneal free fluid.  Musculoskeletal: Bone windows reveal no worrisome lytic or sclerotic osseous  lesions.  IMPRESSION: Interval progression of mediastinal lymphadenopathy compatible with worsening metastatic disease. Dominant left-sided chest wall lesion also shows clear interval progression.  Slight interval decrease in the small right pleural effusion.  No evidence for metastatic disease in the abdomen or pelvis.   Electronically Signed   By: Misty Stanley M.D.   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 I plan to see him back in 6 weeks just after a followup CT scan is scheduled by oncology.  Grace Isaac MD      [address] [place] [address] Office [phone]   Beeper [phone]  01/06/2014 3:06 PM

## 2014-01-07 ENCOUNTER — Encounter: Payer: Self-pay | Admitting: Nurse Practitioner

## 2014-01-07 ENCOUNTER — Other Ambulatory Visit: Payer: Self-pay | Admitting: Internal Medicine

## 2014-01-07 ENCOUNTER — Other Ambulatory Visit: Payer: Self-pay | Admitting: *Deleted

## 2014-01-07 ENCOUNTER — Ambulatory Visit: Payer: Medicare Other

## 2014-01-07 ENCOUNTER — Ambulatory Visit (HOSPITAL_BASED_OUTPATIENT_CLINIC_OR_DEPARTMENT_OTHER): Payer: Medicare Other

## 2014-01-07 ENCOUNTER — Ambulatory Visit: Payer: Medicare Other | Admitting: Cardiothoracic Surgery

## 2014-01-07 ENCOUNTER — Other Ambulatory Visit (HOSPITAL_BASED_OUTPATIENT_CLINIC_OR_DEPARTMENT_OTHER): Payer: Medicare Other

## 2014-01-07 VITALS — BP 103/51 | HR 87 | Temp 97.7°F | Resp 20

## 2014-01-07 DIAGNOSIS — R530 Neoplastic (malignant) related fatigue: Secondary | ICD-10-CM

## 2014-01-07 DIAGNOSIS — C3411 Malignant neoplasm of upper lobe, right bronchus or lung: Secondary | ICD-10-CM

## 2014-01-07 DIAGNOSIS — R5382 Chronic fatigue, unspecified: Secondary | ICD-10-CM

## 2014-01-07 DIAGNOSIS — Z5112 Encounter for antineoplastic immunotherapy: Secondary | ICD-10-CM

## 2014-01-07 DIAGNOSIS — C3492 Malignant neoplasm of unspecified part of left bronchus or lung: Secondary | ICD-10-CM

## 2014-01-07 DIAGNOSIS — Z95828 Presence of other vascular implants and grafts: Secondary | ICD-10-CM

## 2014-01-07 LAB — COMPREHENSIVE METABOLIC PANEL (CC13)
ALT: 21 U/L (ref 0–55)
ANION GAP: 8 meq/L (ref 3–11)
AST: 16 U/L (ref 5–34)
Albumin: 3 g/dL — ABNORMAL LOW (ref 3.5–5.0)
Alkaline Phosphatase: 122 U/L (ref 40–150)
BUN: 11.4 mg/dL (ref 7.0–26.0)
CO2: 25 meq/L (ref 22–29)
Calcium: 9.5 mg/dL (ref 8.4–10.4)
Chloride: 108 mEq/L (ref 98–109)
Creatinine: 0.9 mg/dL (ref 0.7–1.3)
GLUCOSE: 108 mg/dL (ref 70–140)
Potassium: 4 mEq/L (ref 3.5–5.1)
SODIUM: 141 meq/L (ref 136–145)
TOTAL PROTEIN: 6.3 g/dL — AB (ref 6.4–8.3)
Total Bilirubin: 0.34 mg/dL (ref 0.20–1.20)

## 2014-01-07 LAB — CBC WITH DIFFERENTIAL/PLATELET
BASO%: 0.4 % (ref 0.0–2.0)
Basophils Absolute: 0 10*3/uL (ref 0.0–0.1)
EOS%: 2 % (ref 0.0–7.0)
Eosinophils Absolute: 0.1 10*3/uL (ref 0.0–0.5)
HCT: 27.9 % — ABNORMAL LOW (ref 38.4–49.9)
HGB: 9.1 g/dL — ABNORMAL LOW (ref 13.0–17.1)
LYMPH%: 16.9 % (ref 14.0–49.0)
MCH: 34 pg — ABNORMAL HIGH (ref 27.2–33.4)
MCHC: 32.5 g/dL (ref 32.0–36.0)
MCV: 104.7 fL — ABNORMAL HIGH (ref 79.3–98.0)
MONO#: 0.4 10*3/uL (ref 0.1–0.9)
MONO%: 13.7 % (ref 0.0–14.0)
NEUT#: 2.1 10*3/uL (ref 1.5–6.5)
NEUT%: 67 % (ref 39.0–75.0)
PLATELETS: 177 10*3/uL (ref 140–400)
RBC: 2.67 10*6/uL — AB (ref 4.20–5.82)
RDW: 24.3 % — ABNORMAL HIGH (ref 11.0–14.6)
WBC: 3.1 10*3/uL — AB (ref 4.0–10.3)
lymph#: 0.5 10*3/uL — ABNORMAL LOW (ref 0.9–3.3)

## 2014-01-07 LAB — TSH CHCC: TSH: 1.74 m(IU)/L (ref 0.320–4.118)

## 2014-01-07 LAB — MAGNESIUM (CC13): MAGNESIUM: 2.1 mg/dL (ref 1.5–2.5)

## 2014-01-07 MED ORDER — SODIUM CHLORIDE 0.9 % IV SOLN
3.0000 mg/kg | Freq: Once | INTRAVENOUS | Status: AC
  Administered 2014-01-07: 250 mg via INTRAVENOUS
  Filled 2014-01-07: qty 25

## 2014-01-07 MED ORDER — SODIUM CHLORIDE 0.9 % IJ SOLN
10.0000 mL | INTRAMUSCULAR | Status: DC | PRN
  Administered 2014-01-07: 10 mL via INTRAVENOUS
  Filled 2014-01-07: qty 10

## 2014-01-07 MED ORDER — HEPARIN SOD (PORK) LOCK FLUSH 100 UNIT/ML IV SOLN
500.0000 [IU] | Freq: Once | INTRAVENOUS | Status: AC | PRN
Start: 2014-01-07 — End: 2014-01-07
  Administered 2014-01-07: 500 [IU]
  Filled 2014-01-07: qty 5

## 2014-01-07 MED ORDER — SODIUM CHLORIDE 0.9 % IV SOLN
Freq: Once | INTRAVENOUS | Status: AC
  Administered 2014-01-07: 11:00:00 via INTRAVENOUS

## 2014-01-07 MED ORDER — SODIUM CHLORIDE 0.9 % IJ SOLN
10.0000 mL | INTRAMUSCULAR | Status: DC | PRN
  Administered 2014-01-07: 10 mL
  Filled 2014-01-07: qty 10

## 2014-01-07 NOTE — Patient Instructions (Signed)
[facility] Discharge Instructions for Patients Receiving Chemotherapy  Today you received the following chemotherapy agents: Nivolumab  To help prevent nausea and vomiting after your treatment, we encourage you to take your nausea medication as prescribed.    If you develop nausea and vomiting that is not controlled by your nausea medication, call the clinic.   BELOW ARE SYMPTOMS THAT SHOULD BE REPORTED IMMEDIATELY:  *FEVER GREATER THAN 100.5 F  *CHILLS WITH OR WITHOUT FEVER  NAUSEA AND VOMITING THAT IS NOT CONTROLLED WITH YOUR NAUSEA MEDICATION  *UNUSUAL SHORTNESS OF BREATH  *UNUSUAL BRUISING OR BLEEDING  TENDERNESS IN MOUTH AND THROAT WITH OR WITHOUT PRESENCE OF ULCERS  *URINARY PROBLEMS  *BOWEL PROBLEMS  UNUSUAL RASH Items with * indicate a potential emergency and should be followed up as soon as possible.  Feel free to call the clinic you have any questions or concerns. The clinic phone number is [phone].  Nivolumab injection What is this medicine? NIVOLUMAB (nye VOL ue mab) is used to treat certain types of melanoma and lung cancer. This medicine may be used for other purposes; ask your health care provider or pharmacist if you have questions. COMMON BRAND NAME(S): Opdivo What should I tell my health care provider before I take this medicine? They need to know if you have any of these conditions: -eye disease, vision problems -history of pancreatitis -immune system problems -inflammatory bowel disease -kidney disease -liver disease -lung disease -lupus -myasthenia gravis -multiple sclerosis -organ transplant -stomach or intestine problems -thyroid disease -tingling of the fingers or toes, or other nerve disorder -an unusual or allergic reaction to nivolumab, other medicines, foods, dyes, or preservatives -pregnant or trying to get pregnant -breast-feeding How should I use this medicine? This medicine is for infusion into a vein. It  is given by a health care professional in a hospital or clinic setting. A special MedGuide will be given to you before each treatment. Be sure to read this information carefully each time. Talk to your pediatrician regarding the use of this medicine in children. Special care may be needed. Overdosage: If you think you've taken too much of this medicine contact a poison control center or emergency room at once. Overdosage: If you think you have taken too much of this medicine contact a poison control center or emergency room at once. NOTE: This medicine is only for you. Do not share this medicine with others. What if I miss a dose? It is important not to miss your dose. Call your doctor or health care professional if you are unable to keep an appointment. What may interact with this medicine? Interactions have not been studied. This list may not describe all possible interactions. Give your health care provider a list of all the medicines, herbs, non-prescription drugs, or dietary supplements you use. Also tell them if you smoke, drink alcohol, or use illegal drugs. Some items may interact with your medicine. What should I watch for while using this medicine? Tell your doctor or healthcare professional if your symptoms do not start to get better or if they get worse. Your condition will be monitored carefully while you are receiving this medicine. You may need blood work done while you are taking this medicine. What side effects may I notice from receiving this medicine? Side effects that you should report to your doctor or health care professional as soon as possible: -allergic reactions like skin rash, itching or hives, swelling of the face, lips, or tongue -black, tarry stools -bloody  or watery diarrhea -changes in vision -chills -cough -depressed mood -eye pain -feeling anxious -fever -general ill feeling or flu-like symptoms -hair loss -loss of appetite -low blood counts - this  medicine may decrease the number of white blood cells, red blood cells and platelets. You may be at increased risk for infections and bleeding -pain, tingling, numbness in the hands or feet -redness, blistering, peeling or loosening of the skin, including inside the mouth -red pinpoint spots on skin -signs of decreased platelets or bleeding - bruising, pinpoint red spots on the skin, black, tarry stools, blood in the urine -signs of decreased red blood cells - unusually weak or tired, feeling faint or lightheaded, falls -signs of infection - fever or chills, cough, sore throat, pain or trouble passing urine -signs and symptoms of a dangerous change in heartbeat or heart rhythm like chest pain; dizziness; fast or irregular heartbeat; palpitations; feeling faint or lightheaded, falls; breathing problems -signs and symptoms of high blood sugar such as dizziness; dry mouth; dry skin; fruity breath; nausea; stomach pain; increased hunger or thirst; increased urination -signs and symptoms of kidney injury like trouble passing urine or change in the amount of urine -signs and symptoms of liver injury like dark yellow or brown urine; general ill feeling or flu-like symptoms; light-colored stools; loss of appetite; nausea; right upper belly pain; unusually weak or tired; yellowing of the eyes or skin -signs and symptoms of increased potassium like muscle weakness; chest pain; or fast, irregular heartbeat -signs and symptoms of low potassium like muscle cramps or muscle pain; chest pain; dizziness; feeling faint or lightheaded, falls; palpitations; breathing problems; or fast, irregular heartbeat -swelling of the ankles, feet, hands -weight gainSide effects that usually do not require medical attention (report to your doctor or health care professional if they continue or are bothersome): -constipation -general ill feeling or flu-like symptoms -hair loss -loss of appetite -nausea, vomiting This list may  not describe all possible side effects. Call your doctor for medical advice about side effects. You may report side effects to FDA at 1-800-FDA-1088. Where should I keep my medicine? This drug is given in a hospital or clinic and will not be stored at home. NOTE: This sheet is a summary. It may not cover all possible information. If you have questions about this medicine, talk to your doctor, pharmacist, or health care provider.  2015, [employer]vier/Gold Standard. (2013-06-03 13:18:19)

## 2014-01-07 NOTE — Patient Instructions (Signed)

## 2014-01-08 ENCOUNTER — Telehealth: Payer: Self-pay | Admitting: *Deleted

## 2014-01-08 NOTE — Telephone Encounter (Signed)
Called Terry Robles for chemotherapy F/U.  Patient is doing well.  Denies n/v.  Denies any new side effects or symptoms.  Bowel and bladder is functioning well.  Eating and drinking well and I instructed to drink 64 oz minimum daily or at least the day before, of and after treatment.  Denies questions at this time and encouraged to call if needed.  Reviewed how to call after hours in the case of an emergency.

## 2014-01-08 NOTE — Telephone Encounter (Signed)
Message copied by Cherylynn Ridges on Wed Jan 08, 2014 12:21 PM ------      Message from: Christa See      Created: Tue Jan 07, 2014 11:23 AM      Regarding: 1st time nivolumab f/u call      Contact: [phone]       First time Nivolumab. MD is Mohamed. Can u see how he is? Thanks! Kristen  ------

## 2014-01-12 ENCOUNTER — Other Ambulatory Visit: Payer: Self-pay | Admitting: Physician Assistant

## 2014-01-13 ENCOUNTER — Other Ambulatory Visit: Payer: Self-pay | Admitting: *Deleted

## 2014-01-13 DIAGNOSIS — C3411 Malignant neoplasm of upper lobe, right bronchus or lung: Secondary | ICD-10-CM

## 2014-01-13 MED ORDER — OXYCODONE-ACETAMINOPHEN 5-325 MG PO TABS: 1.0000 | ORAL_TABLET | Freq: Four times a day (QID) | ORAL | Status: DC | PRN

## 2014-01-13 NOTE — Telephone Encounter (Signed)
MyChart request for refill last filled 11-27-2013 to provider.

## 2014-01-14 ENCOUNTER — Other Ambulatory Visit (HOSPITAL_BASED_OUTPATIENT_CLINIC_OR_DEPARTMENT_OTHER): Payer: Medicare Other

## 2014-01-14 DIAGNOSIS — C3492 Malignant neoplasm of unspecified part of left bronchus or lung: Secondary | ICD-10-CM

## 2014-01-14 DIAGNOSIS — C3411 Malignant neoplasm of upper lobe, right bronchus or lung: Secondary | ICD-10-CM

## 2014-01-14 LAB — COMPREHENSIVE METABOLIC PANEL (CC13)
ALT: 23 U/L (ref 0–55)
ANION GAP: 9 meq/L (ref 3–11)
AST: 21 U/L (ref 5–34)
Albumin: 3.1 g/dL — ABNORMAL LOW (ref 3.5–5.0)
Alkaline Phosphatase: 150 U/L (ref 40–150)
BUN: 13.5 mg/dL (ref 7.0–26.0)
CO2: 24 mEq/L (ref 22–29)
CREATININE: 0.8 mg/dL (ref 0.7–1.3)
Calcium: 9.8 mg/dL (ref 8.4–10.4)
Chloride: 107 mEq/L (ref 98–109)
Glucose: 105 mg/dl (ref 70–140)
Potassium: 4 mEq/L (ref 3.5–5.1)
Sodium: 140 mEq/L (ref 136–145)
Total Bilirubin: 0.45 mg/dL (ref 0.20–1.20)
Total Protein: 6.5 g/dL (ref 6.4–8.3)

## 2014-01-14 LAB — CBC WITH DIFFERENTIAL/PLATELET
BASO%: 0.3 % (ref 0.0–2.0)
Basophils Absolute: 0 10*3/uL (ref 0.0–0.1)
EOS%: 1.6 % (ref 0.0–7.0)
Eosinophils Absolute: 0.1 10*3/uL (ref 0.0–0.5)
HEMATOCRIT: 31 % — AB (ref 38.4–49.9)
HGB: 10.2 g/dL — ABNORMAL LOW (ref 13.0–17.1)
LYMPH%: 19.9 % (ref 14.0–49.0)
MCH: 34.8 pg — ABNORMAL HIGH (ref 27.2–33.4)
MCHC: 32.9 g/dL (ref 32.0–36.0)
MCV: 105.8 fL — AB (ref 79.3–98.0)
MONO#: 0.6 10*3/uL (ref 0.1–0.9)
MONO%: 16.1 % — AB (ref 0.0–14.0)
NEUT#: 2.3 10*3/uL (ref 1.5–6.5)
NEUT%: 62.1 % (ref 39.0–75.0)
PLATELETS: 270 10*3/uL (ref 140–400)
RBC: 2.93 10*6/uL — ABNORMAL LOW (ref 4.20–5.82)
RDW: 21.5 % — ABNORMAL HIGH (ref 11.0–14.6)
WBC: 3.7 10*3/uL — ABNORMAL LOW (ref 4.0–10.3)
lymph#: 0.7 10*3/uL — ABNORMAL LOW (ref 0.9–3.3)
nRBC: 0 % (ref 0–0)

## 2014-01-17 ENCOUNTER — Other Ambulatory Visit: Payer: Self-pay | Admitting: Internal Medicine

## 2014-01-20 ENCOUNTER — Encounter: Payer: Self-pay | Admitting: Nurse Practitioner

## 2014-01-20 ENCOUNTER — Encounter: Payer: Self-pay | Admitting: Oncology

## 2014-01-20 ENCOUNTER — Ambulatory Visit: Payer: Medicare Other

## 2014-01-20 ENCOUNTER — Ambulatory Visit (HOSPITAL_BASED_OUTPATIENT_CLINIC_OR_DEPARTMENT_OTHER): Payer: Medicare Other | Admitting: Oncology

## 2014-01-20 ENCOUNTER — Other Ambulatory Visit (HOSPITAL_BASED_OUTPATIENT_CLINIC_OR_DEPARTMENT_OTHER): Payer: Medicare Other

## 2014-01-20 ENCOUNTER — Ambulatory Visit (HOSPITAL_BASED_OUTPATIENT_CLINIC_OR_DEPARTMENT_OTHER): Payer: Medicare Other

## 2014-01-20 VITALS — BP 111/55 | HR 77 | Temp 98.3°F | Resp 18 | Ht 74.0 in | Wt 192.9 lb

## 2014-01-20 DIAGNOSIS — Z5112 Encounter for antineoplastic immunotherapy: Secondary | ICD-10-CM

## 2014-01-20 DIAGNOSIS — C3492 Malignant neoplasm of unspecified part of left bronchus or lung: Secondary | ICD-10-CM

## 2014-01-20 DIAGNOSIS — C3411 Malignant neoplasm of upper lobe, right bronchus or lung: Secondary | ICD-10-CM

## 2014-01-20 DIAGNOSIS — Z95828 Presence of other vascular implants and grafts: Secondary | ICD-10-CM

## 2014-01-20 LAB — CBC WITH DIFFERENTIAL/PLATELET
BASO%: 0.9 % (ref 0.0–2.0)
BASOS ABS: 0 10*3/uL (ref 0.0–0.1)
EOS ABS: 0.1 10*3/uL (ref 0.0–0.5)
EOS%: 1.4 % (ref 0.0–7.0)
HCT: 29 % — ABNORMAL LOW (ref 38.4–49.9)
HEMOGLOBIN: 9.7 g/dL — AB (ref 13.0–17.1)
LYMPH#: 0.6 10*3/uL — AB (ref 0.9–3.3)
LYMPH%: 13.9 % — ABNORMAL LOW (ref 14.0–49.0)
MCH: 35.9 pg — ABNORMAL HIGH (ref 27.2–33.4)
MCHC: 33.6 g/dL (ref 32.0–36.0)
MCV: 106.9 fL — AB (ref 79.3–98.0)
MONO#: 0.7 10*3/uL (ref 0.1–0.9)
MONO%: 15.8 % — ABNORMAL HIGH (ref 0.0–14.0)
NEUT%: 68 % (ref 39.0–75.0)
NEUTROS ABS: 2.9 10*3/uL (ref 1.5–6.5)
Platelets: 287 10*3/uL (ref 140–400)
RBC: 2.71 10*6/uL — ABNORMAL LOW (ref 4.20–5.82)
RDW: 22.3 % — AB (ref 11.0–14.6)
WBC: 4.2 10*3/uL (ref 4.0–10.3)

## 2014-01-20 LAB — COMPREHENSIVE METABOLIC PANEL (CC13)
ALBUMIN: 2.9 g/dL — AB (ref 3.5–5.0)
ALT: 17 U/L (ref 0–55)
AST: 22 U/L (ref 5–34)
Alkaline Phosphatase: 125 U/L (ref 40–150)
Anion Gap: 7 mEq/L (ref 3–11)
BUN: 11.8 mg/dL (ref 7.0–26.0)
CALCIUM: 9.3 mg/dL (ref 8.4–10.4)
CHLORIDE: 109 meq/L (ref 98–109)
CO2: 25 mEq/L (ref 22–29)
Creatinine: 0.9 mg/dL (ref 0.7–1.3)
Glucose: 106 mg/dl (ref 70–140)
POTASSIUM: 4.1 meq/L (ref 3.5–5.1)
Sodium: 141 mEq/L (ref 136–145)
Total Bilirubin: 0.31 mg/dL (ref 0.20–1.20)
Total Protein: 6.1 g/dL — ABNORMAL LOW (ref 6.4–8.3)

## 2014-01-20 MED ORDER — HEPARIN SOD (PORK) LOCK FLUSH 100 UNIT/ML IV SOLN
500.0000 [IU] | Freq: Once | INTRAVENOUS | Status: AC
  Administered 2014-01-20: 500 [IU] via INTRAVENOUS
  Filled 2014-01-20: qty 5

## 2014-01-20 MED ORDER — SODIUM CHLORIDE 0.9 % IV SOLN
Freq: Once | INTRAVENOUS | Status: AC
  Administered 2014-01-20: 13:00:00 via INTRAVENOUS

## 2014-01-20 MED ORDER — SODIUM CHLORIDE 0.9 % IJ SOLN
10.0000 mL | INTRAMUSCULAR | Status: DC | PRN
  Administered 2014-01-20: 10 mL via INTRAVENOUS
  Filled 2014-01-20: qty 10

## 2014-01-20 MED ORDER — NIVOLUMAB CHEMO INJECTION 100 MG/10ML
2.8000 mg/kg | Freq: Once | INTRAVENOUS | Status: AC
  Administered 2014-01-20: 240 mg via INTRAVENOUS
  Filled 2014-01-20: qty 24

## 2014-01-20 NOTE — Patient Instructions (Signed)
[facility] Discharge Instructions for Patients Receiving Chemotherapy  Today you received the following chemotherapy agents: nivolumab  To help prevent nausea and vomiting after your treatment, we encourage you to take your nausea medication.  Take it as often as prescribed.     If you develop nausea and vomiting that is not controlled by your nausea medication, call the clinic. If it is after clinic hours your family physician or the after hours number for the clinic or go to the Emergency Department.   BELOW ARE SYMPTOMS THAT SHOULD BE REPORTED IMMEDIATELY:  *FEVER GREATER THAN 100.5 F  *CHILLS WITH OR WITHOUT FEVER  NAUSEA AND VOMITING THAT IS NOT CONTROLLED WITH YOUR NAUSEA MEDICATION  *UNUSUAL SHORTNESS OF BREATH  *UNUSUAL BRUISING OR BLEEDING  TENDERNESS IN MOUTH AND THROAT WITH OR WITHOUT PRESENCE OF ULCERS  *URINARY PROBLEMS  *BOWEL PROBLEMS  UNUSUAL RASH Items with * indicate a potential emergency and should be followed up as soon as possible.  Feel free to call the clinic you have any questions or concerns. The clinic phone number is [phone].   I have been informed and understand all the instructions given to me. I know to contact the clinic, my physician, or go to the Emergency Department if any problems should occur. I do not have any questions at this time, but understand that I may call the clinic during office hours   should I have any questions or need assistance in obtaining follow up care.    __________________________________________  _____________  __________ Signature of Patient or Authorized Representative            Date                   Time    __________________________________________ Nurse's Signature

## 2014-01-20 NOTE — Patient Instructions (Signed)

## 2014-01-20 NOTE — Assessment & Plan Note (Signed)
Patient received his last carboplatin/Abraxane chemotherapy on 12/09/2013. He obtained a restaging CT last week; which did unfortunately show progression of his disease. He is now on Nivolumab immunotherapy and tolerating this well. Recommend that he proceed with cycle 2 today.   He will return in 2 weeks for his 3rd cycle. He was encouraged to call to questions or concerns in the interim.

## 2014-01-20 NOTE — Progress Notes (Signed)
[name], MD  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  Primary site: Lung (Right)  Staging method: AJCC 7th Edition  Clinical: Stage IIIA (T3, N2, M0) signed by Curt Bears, MD on 05/09/2013 2:56 PM  Summary: Stage IIIA (T3, N2, M0)   PRIOR THERAPY:  1) Concurrent chemoradiation with weekly carboplatin for an AUC of 2 and paclitaxel 45 mg per meter squared. Status post 5 cycles.  2) Bronchoscopy, mediastinoscopy, right posterior lateral thoracotomy with right upper lobectomy, lymph node dissection,  and posterior approach to en bloc chest wall resection, ribs 2, 3, and 4, with positive bronchial resection margin.  3) palliative radiotherapy to the best of resection margin under the care of Dr. Tammi Klippel completed on 10/14/2013.  4) Systemic chemotherapy with carboplatin for AUC of 5 on day 1 and Abraxane 100 mg/M2 on days 1, 8 and 15 every 3 weeks. First cycle 10/21/2013. Status post 3 cycles   CURRENT THERAPY: Nivolumab 3 mg/kg started on 01/07/14.  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   HPI: Terry Robles 73 y.o. male here for his second cycle of Nivolumab.  Tolerated the first dose well. Reports that his fatigue has improved. He has mild dyspnea on exertion and intermittent dry cough. He denies any recent fevers or chills.  He denies any nausea, vomiting, diarrhea, or constipation.    HPI    Review of Systems  Constitutional: Positive for malaise/fatigue. Negative for fever, chills, weight loss and diaphoresis.  Eyes: Negative.   Respiratory: Positive for cough. Negative for hemoptysis, sputum production, shortness of breath and wheezing.   Cardiovascular: Negative.   Gastrointestinal: Negative.   Genitourinary: Negative.   Musculoskeletal: Positive for neck pain. Negative for back pain, falls, joint pain and myalgias.  Skin: Negative.   Neurological: Negative.  Negative for weakness.  Endo/Heme/Allergies: Negative.   Psychiatric/Behavioral: Negative.     Past Medical History  Diagnosis Date  . Hypercholesteremia 05/14/2011  . BPH (benign prostatic hyperplasia) 05/14/2011    takes Uroxatral daily  . GERD (gastroesophageal reflux disease)   . 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 Cancer     squamous cell carcinoma right superior sulcus  . Lung cancer     right lung  . Shortness of breath     weather related  . Pneumonia 1965  . Headache(784.0)     occasionally  . Joint pain   . Maintenance chemotherapy   . Urinary frequency   . History of shingles   . Anemia   . Acquired thrombocytopenia     Past Surgical History  Procedure Laterality Date  . Other  surgical history      benign tumor removed from left leg several years ago  . Other surgical history      had melanoma/squamous cell removed previously per patient  . Tumor excision      Left knee  . Colonoscopy w/ biopsies and polypectomy    . Video bronchoscopy N/A 07/30/2013    Procedure: VIDEO BRONCHOSCOPY;  Surgeon: Grace Isaac, MD;  Location: [facility];  Service: Thoracic;  Laterality: N/A;  . Mediastinoscopy N/A 07/30/2013    Procedure:  MEDIASTINOSCOPY;  Surgeon: Grace Isaac, MD;  Location: [place];  Service: Thoracic;  Laterality: N/A;  . Video assisted thoracoscopy (vats)/wedge resection Right 07/30/2013    Procedure: VIDEO ASSISTED THORACOSCOPY;  Surgeon: Grace Isaac, MD;  Location: [facility];  Service: Thoracic;  Laterality: Right;  . Thoracotomy  07/30/2013    Procedure: THORACOTOMY FOR CHEST WALL RESECTION, RIGHT UPPER LOBECTOMY;  Surgeon: Grace Isaac, MD;  Location: [facility];  Service: Thoracic;;  . Adenoidectomy  as a child  . Tonsillectomy    . Portacath placement Left 10/30/2013    Procedure: INSERTION PORT-A-CATH WITH ULTRASOUND GUIDANCE AND FLUORO;  Surgeon: Grace Isaac, MD;  Location: 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     has No Known Allergies.    Medication List       This list is accurate as of: 01/20/14  1:10 PM.  Always use your most recent med list.               alfuzosin 10 MG 24 hr tablet  Commonly known as:  UROXATRAL  Take 10 mg by mouth daily with breakfast.     cycloSPORINE 0.05 % ophthalmic emulsion  Commonly known as:  RESTASIS  1 drop 2 (two) times daily.     docusate sodium 100 MG capsule  Commonly known as:  COLACE  Take 100 mg by mouth daily.     feeding supplement (RESOURCE BREEZE) Liqd  Take 1 Container by mouth daily at 3 pm.     HYDROcodone-homatropine 5-1.5 MG/5ML syrup  Commonly known as:  HYCODAN  Take 5 mLs by mouth every 6 (six) hours as needed for cough.     multivitamin with minerals Tabs tablet  Take 1 tablet by mouth daily.      oxyCODONE-acetaminophen 5-325 MG per tablet  Commonly known as:  PERCOCET/ROXICET  Take 1 tablet by mouth every 6 (six) hours as needed for severe pain.     polyethylene glycol packet  Commonly known as:  MIRALAX / GLYCOLAX  Take 17 g by mouth daily.     prochlorperazine 5 MG tablet  Commonly known as:  COMPAZINE  Take 5 mg by mouth every 6 (six) hours as needed for nausea or vomiting.         PHYSICAL EXAMINATION   BP 111/55  Pulse 77  Temp(Src) 98.3 F (36.8 C) (Oral)  Resp 18  Ht 6\' 2"  (1.88 m)  Wt 192 lb 14.4 oz (87.499 kg)  BMI 24.76 kg/m2  SpO2 98% Physical Exam  Nursing note and vitals reviewed. Constitutional: He is oriented to person, place, and time. He appears  malnourished and dehydrated. He appears unhealthy. No distress.  HENT:  Head: Normocephalic and atraumatic.  Mouth/Throat: Oropharynx is clear and moist. No oropharyngeal exudate.  Eyes: Conjunctivae and EOM are normal. Pupils are equal, round, and reactive to light. No scleral icterus.  Neck: Normal range of motion. Neck supple. No JVD present. No tracheal deviation present. No thyromegaly present.  Cardiovascular: Normal rate, regular rhythm, normal heart sounds and intact distal pulses.   Pulmonary/Chest: Effort normal and breath sounds normal. No respiratory distress. He has no wheezes. He has no rales.  Abdominal: Soft. Bowel sounds are normal. He exhibits no distension. There is no tenderness. There is no rebound.  Musculoskeletal: Normal range of motion. He exhibits no edema and no tenderness.  Lymphadenopathy:    He has no cervical adenopathy.  Neurological: He is alert and oriented to person, place, and time. Gait normal.  Skin: Skin is warm and dry. No rash noted. He is not diaphoretic. No erythema.  Psychiatric: Affect normal.    LABORATORY DATA:. CBC  Lab Results  Component Value Date   WBC 4.2 01/20/2014   RBC 2.71* 01/20/2014   HGB 9.7* 01/20/2014   HCT 29.0* 01/20/2014   PLT 287  01/20/2014   MCV 106.9* 01/20/2014   MCH 35.9* 01/20/2014   MCHC 33.6 01/20/2014   RDW 22.3* 01/20/2014   LYMPHSABS 0.6* 01/20/2014   MONOABS 0.7 01/20/2014   EOSABS 0.1 01/20/2014   BASOSABS 0.0 01/20/2014     CMET  Lab Results  Component Value Date   NA 141 01/20/2014   K 4.1 01/20/2014   CL 104 12/19/2013   CO2 25 01/20/2014   GLUCOSE 106 01/20/2014   BUN 11.8 01/20/2014   CREATININE 0.9 01/20/2014   CALCIUM 9.3 01/20/2014   PROT 6.1* 01/20/2014   ALBUMIN 2.9* 01/20/2014   AST 22 01/20/2014   ALT 17 01/20/2014   ALKPHOS 125 01/20/2014   BILITOT 0.31 01/20/2014   GFRNONAA 84* 12/19/2013   GFRAA >90 12/19/2013    RADIOGRAPHIC STUDIES:       CT Chest W Contrast Status: Final result         PACS Images    Show images for CT Chest W Contrast         Study Result    CLINICAL DATA: Subsequent encounter for right-sided Lung cancer.  Squamous cell carcinoma of the right upper lobe.  EXAM:  CT CHEST, ABDOMEN, AND PELVIS WITH CONTRAST  TECHNIQUE:  Multidetector CT imaging of the chest, abdomen and pelvis was  performed following the standard protocol during bolus  administration of intravenous contrast.  CONTRAST: 152mL OMNIPAQUE IOHEXOL 300 MG/ML SOLN  COMPARISON: PET-CT from 10/18/2013. Chest CT from 10/11/2013.  FINDINGS:  CT CHEST FINDINGS  Soft tissue / Mediastinum: The tip of the left-sided Port-A-Cath is  positioned at the SVC/ RA junction. There is no axillary  lymphadenopathy. 10 mm prevascular short axis lymph node (image 7  series 2) is new in the interval. 10 mm short axis precarinal lymph  node was 5 mm previously. 14 mm short axis subcarinal lymph node on  the previous study has increased in size to 19 mm today. No left  hilar lymphadenopathy. A amorphous soft tissue in the right hilum is  likely related to post treatment change.  3.1 cm posterior lateral left chest wall lesion on the previous  study is now 3.2 cm in the same dimension  however, when measuring in  long axis, this same lesion has increased in size  from 3.7 cm  previously to 5.8 cm currently.  Tiny chest wall nodule seen on image 42 series 2 today has not  increased in size in the interval, but was hypermetabolic on the  previous PET-CT.  Lungs / Pleura: Tiny air-fluid level in the right apex is new in the  interval. Airspace opacity in the posterior right upper lobe is  stable and may represent postradiation fibrosis. This is underlying  the previous on bloc resection.  Lungs are diffusely emphysematous.  Tiny posterior right pleural effusion has has decreased in the  interval.  Bones: Bone windows reveal no worrisome lytic or sclerotic osseous  lesions.  CT ABDOMEN AND PELVIS FINDINGS  Hepatobiliary: No focal abnormality within the liver parenchyma.  Multiple tiny stones are seen within the gallbladder. No  intrahepatic or extrahepatic biliary dilation.  Pancreas: No focal mass lesion. No dilatation of the main duct. No  intraparenchymal cyst. No peripancreatic edema.  Spleen: No splenomegaly. No focal mass lesion.  Adrenals/Urinary Tract: No adrenal nodule or mass. Kidneys are  unremarkable. No ureteral dilatation. Urinary bladder has normal CT  imaging features.  Stomach/Bowel: Stomach is nondistended. No gastric wall thickening.  No evidence of outlet obstruction. Duodenum is normally positioned  as is the ligament of Treitz. No small bowel wall thickening. No  small bowel dilatation. Terminal ileum is normal. Appendix measures  up to 11 mm in diameter which is technically dilated, but it was 9  mm previously. The lumen is filled with contrast, debris, and air.  No evidence for wall thickening or periappendiceal  edema/inflammation to suggest acute appendicitis. Mild diverticular  change seen in the sigmoid colon without diverticulitis.  Vascular/Lymphatic: Atherosclerotic calcification is noted in the  wall of the abdominal aorta without  aneurysm. No gastrohepatic or  hepatoduodenal ligament lymphadenopathy. No retroperitoneal  lymphadenopathy. No pelvic sidewall lymphadenopathy.  Reproductive: Prostate gland is enlarged.  Other: Left inguinal hernia contains only fat. No intraperitoneal  free fluid.  Musculoskeletal: Bone windows reveal no worrisome lytic or sclerotic  osseous lesions.  IMPRESSION:  Interval progression of mediastinal lymphadenopathy compatible with  worsening metastatic disease. Dominant left-sided chest wall lesion  also shows clear interval progression.  Slight interval decrease in the small right pleural effusion.  No evidence for metastatic disease in the abdomen or pelvis.  Electronically Signed  By: Misty Stanley M.D.  On: 12/27/2013 14:51      ASSESSMENT/PLAN:    Malignant neoplasm of right upper lobe of lung  Assessment & Plan Patient received his last carboplatin/Abraxane chemotherapy on 12/09/2013. He obtained a restaging CT last week; which did unfortunately show progression of his disease. He is now on Nivolumab immunotherapy and tolerating this well. Recommend that he proceed with cycle 2 today.   He will return in 2 weeks for his 3rd cycle. He was encouraged to call to questions or concerns in the interim.      Mikey Bussing, NP 01/20/2014

## 2014-01-21 ENCOUNTER — Ambulatory Visit: Payer: Medicare Other

## 2014-02-04 ENCOUNTER — Encounter: Payer: Self-pay | Admitting: Nurse Practitioner

## 2014-02-04 ENCOUNTER — Ambulatory Visit: Payer: Medicare Other

## 2014-02-04 ENCOUNTER — Ambulatory Visit (HOSPITAL_BASED_OUTPATIENT_CLINIC_OR_DEPARTMENT_OTHER): Payer: Medicare Other

## 2014-02-04 ENCOUNTER — Other Ambulatory Visit: Payer: Self-pay | Admitting: Internal Medicine

## 2014-02-04 ENCOUNTER — Other Ambulatory Visit (HOSPITAL_BASED_OUTPATIENT_CLINIC_OR_DEPARTMENT_OTHER): Payer: Medicare Other

## 2014-02-04 ENCOUNTER — Ambulatory Visit (HOSPITAL_BASED_OUTPATIENT_CLINIC_OR_DEPARTMENT_OTHER): Payer: Medicare Other | Admitting: Nurse Practitioner

## 2014-02-04 DIAGNOSIS — C3492 Malignant neoplasm of unspecified part of left bronchus or lung: Secondary | ICD-10-CM

## 2014-02-04 DIAGNOSIS — E8809 Other disorders of plasma-protein metabolism, not elsewhere classified: Secondary | ICD-10-CM

## 2014-02-04 DIAGNOSIS — C3411 Malignant neoplasm of upper lobe, right bronchus or lung: Secondary | ICD-10-CM

## 2014-02-04 DIAGNOSIS — Z95828 Presence of other vascular implants and grafts: Secondary | ICD-10-CM

## 2014-02-04 DIAGNOSIS — R5382 Chronic fatigue, unspecified: Secondary | ICD-10-CM

## 2014-02-04 DIAGNOSIS — Z5112 Encounter for antineoplastic immunotherapy: Secondary | ICD-10-CM

## 2014-02-04 DIAGNOSIS — Z79899 Other long term (current) drug therapy: Secondary | ICD-10-CM

## 2014-02-04 LAB — COMPREHENSIVE METABOLIC PANEL (CC13)
ALBUMIN: 3.2 g/dL — AB (ref 3.5–5.0)
ALT: 21 U/L (ref 0–55)
AST: 25 U/L (ref 5–34)
Alkaline Phosphatase: 142 U/L (ref 40–150)
Anion Gap: 6 mEq/L (ref 3–11)
BILIRUBIN TOTAL: 0.28 mg/dL (ref 0.20–1.20)
BUN: 18.6 mg/dL (ref 7.0–26.0)
CO2: 27 mEq/L (ref 22–29)
Calcium: 9.5 mg/dL (ref 8.4–10.4)
Chloride: 106 mEq/L (ref 98–109)
Creatinine: 1 mg/dL (ref 0.7–1.3)
GLUCOSE: 105 mg/dL (ref 70–140)
Potassium: 4.1 mEq/L (ref 3.5–5.1)
Sodium: 139 mEq/L (ref 136–145)
Total Protein: 6.7 g/dL (ref 6.4–8.3)

## 2014-02-04 LAB — CBC WITH DIFFERENTIAL/PLATELET
BASO%: 0.9 % (ref 0.0–2.0)
Basophils Absolute: 0.1 10*3/uL (ref 0.0–0.1)
EOS%: 6.3 % (ref 0.0–7.0)
Eosinophils Absolute: 0.4 10*3/uL (ref 0.0–0.5)
HCT: 32.6 % — ABNORMAL LOW (ref 38.4–49.9)
HGB: 10.7 g/dL — ABNORMAL LOW (ref 13.0–17.1)
LYMPH%: 13.5 % — ABNORMAL LOW (ref 14.0–49.0)
MCH: 35.2 pg — ABNORMAL HIGH (ref 27.2–33.4)
MCHC: 32.9 g/dL (ref 32.0–36.0)
MCV: 106.9 fL — AB (ref 79.3–98.0)
MONO#: 0.7 10*3/uL (ref 0.1–0.9)
MONO%: 11.9 % (ref 0.0–14.0)
NEUT#: 4 10*3/uL (ref 1.5–6.5)
NEUT%: 67.4 % (ref 39.0–75.0)
PLATELETS: 282 10*3/uL (ref 140–400)
RBC: 3.05 10*6/uL — ABNORMAL LOW (ref 4.20–5.82)
RDW: 18 % — ABNORMAL HIGH (ref 11.0–14.6)
WBC: 5.9 10*3/uL (ref 4.0–10.3)
lymph#: 0.8 10*3/uL — ABNORMAL LOW (ref 0.9–3.3)

## 2014-02-04 LAB — TSH CHCC: TSH: 1.982 m(IU)/L (ref 0.320–4.118)

## 2014-02-04 MED ORDER — SODIUM CHLORIDE 0.9 % IV SOLN
240.0000 mg | Freq: Once | INTRAVENOUS | Status: AC
  Administered 2014-02-04: 240 mg via INTRAVENOUS
  Filled 2014-02-04: qty 24

## 2014-02-04 MED ORDER — SODIUM CHLORIDE 0.9 % IJ SOLN
10.0000 mL | INTRAMUSCULAR | Status: DC | PRN
  Administered 2014-02-04: 10 mL
  Filled 2014-02-04: qty 10

## 2014-02-04 MED ORDER — SODIUM CHLORIDE 0.9 % IV SOLN
Freq: Once | INTRAVENOUS | Status: AC
  Administered 2014-02-04: 15:00:00 via INTRAVENOUS

## 2014-02-04 MED ORDER — SODIUM CHLORIDE 0.9 % IJ SOLN
10.0000 mL | INTRAMUSCULAR | Status: DC | PRN
  Administered 2014-02-04: 10 mL via INTRAVENOUS
  Filled 2014-02-04: qty 10

## 2014-02-04 MED ORDER — HEPARIN SOD (PORK) LOCK FLUSH 100 UNIT/ML IV SOLN
500.0000 [IU] | Freq: Once | INTRAVENOUS | Status: AC | PRN
  Administered 2014-02-04: 500 [IU]
  Filled 2014-02-04: qty 5

## 2014-02-04 NOTE — Patient Instructions (Signed)

## 2014-02-04 NOTE — Patient Instructions (Signed)
[facility] Discharge Instructions for Patients Receiving Chemotherapy  Today you received the following chemotherapy agents: Nivolumab  To help prevent nausea and vomiting after your treatment, we encourage you to take your nausea medication as prescribed by your physician.   If you develop nausea and vomiting that is not controlled by your nausea medication, call the clinic.   BELOW ARE SYMPTOMS THAT SHOULD BE REPORTED IMMEDIATELY:  *FEVER GREATER THAN 100.5 F  *CHILLS WITH OR WITHOUT FEVER  NAUSEA AND VOMITING THAT IS NOT CONTROLLED WITH YOUR NAUSEA MEDICATION  *UNUSUAL SHORTNESS OF BREATH  *UNUSUAL BRUISING OR BLEEDING  TENDERNESS IN MOUTH AND THROAT WITH OR WITHOUT PRESENCE OF ULCERS  *URINARY PROBLEMS  *BOWEL PROBLEMS  UNUSUAL RASH Items with * indicate a potential emergency and should be followed up as soon as possible.  Feel free to call the clinic you have any questions or concerns. The clinic phone number is [phone].

## 2014-02-05 ENCOUNTER — Telehealth: Payer: Self-pay | Admitting: Internal Medicine

## 2014-02-05 NOTE — Progress Notes (Signed)
SYMPTOM MANAGEMENT CLINIC   HPI: Terry Robles 73 y.o. male diagnosed with lung cancer.  Currently undergoing nivolumab immunotherapy.  Patient presents today for cycle 3 of his Nivolumab immunotherapy.  He states he's been doing fairly well  since his last cycle of nivolumab.  He states that he still has an occasional dry cough.  He denies any GI symptoms.  He denies any recent fevers or chills.  Patient states that he has a follow appointment with Dr. Servando Snare on 02/13/2014.   HPI   ROS  Past Medical History  Diagnosis Date  . Hypercholesteremia 05/14/2011  . BPH (benign prostatic hyperplasia) 05/14/2011    takes Uroxatral daily  . GERD (gastroesophageal reflux disease)   . 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 Cancer     squamous cell carcinoma right superior sulcus  . Lung cancer     right lung  . Shortness of breath     weather related  . Pneumonia 1965  . Headache(784.0)     occasionally  . Joint pain   . Maintenance chemotherapy   . Urinary frequency   . History of shingles   . Anemia   . Acquired thrombocytopenia     Past Surgical History  Procedure Laterality Date  . Other surgical history      benign tumor removed from left leg several years ago  . Other surgical history      had melanoma/squamous cell removed previously per patient  . Tumor excision      Left knee  . Colonoscopy w/ biopsies and polypectomy    . Video bronchoscopy N/A 07/30/2013    Procedure: VIDEO BRONCHOSCOPY;  Surgeon: Grace Isaac, MD;  Location: [facility];  Service: Thoracic;  Laterality: N/A;  . Mediastinoscopy N/A 07/30/2013    Procedure: MEDIASTINOSCOPY;  Surgeon: Grace Isaac, MD;  Location: [facility];  Service: Thoracic;  Laterality: N/A;  . Video assisted thoracoscopy (vats)/wedge resection Right 07/30/2013    Procedure: VIDEO ASSISTED THORACOSCOPY;  Surgeon: Grace Isaac, MD;  Location: [facility];  Service: Thoracic;  Laterality: Right;  . Thoracotomy  07/30/2013     Procedure: THORACOTOMY FOR CHEST WALL RESECTION, RIGHT UPPER LOBECTOMY;  Surgeon: Grace Isaac, MD;  Location: [facility];  Service: Thoracic;;  . Adenoidectomy  as a child  . Tonsillectomy    . 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     has No Known Allergies.    Medication List       This list is accurate as of: 02/04/14 11:59 PM.  Always use your most recent med list.               alfuzosin 10 MG 24 hr tablet  Commonly known as:  UROXATRAL  Take 10 mg by mouth daily with breakfast.     cycloSPORINE 0.05 % ophthalmic emulsion  Commonly known as:  RESTASIS  1 drop 2 (two) times daily.     docusate sodium 100 MG  capsule  Commonly known as:  COLACE  Take 100 mg by mouth daily.     feeding supplement (RESOURCE BREEZE) Liqd  Take 1 Container by mouth daily at 3 pm.     HYDROcodone-homatropine 5-1.5 MG/5ML syrup  Commonly known as:  HYCODAN  Take 5 mLs by mouth every 6 (six) hours as needed for cough.     multivitamin with minerals Tabs tablet  Take 1 tablet by mouth daily.     oxyCODONE-acetaminophen 5-325 MG per tablet  Commonly known as:  PERCOCET/ROXICET  Take 1 tablet by mouth every 6 (six) hours as needed for severe pain.     polyethylene glycol packet  Commonly known as:  MIRALAX / GLYCOLAX  Take 17 g by mouth daily.     prochlorperazine 5 MG tablet  Commonly known as:  COMPAZINE  Take 5 mg by mouth every 6  (six) hours as needed for nausea or vomiting.         PHYSICAL EXAMINATION  Vitals: BP 112/64, HR 71, temp 98.7, sat 98  Physical Exam  Constitutional: He is oriented to person, place, and time and well-developed, well-nourished, and in no distress.  HENT:  Head: Normocephalic and atraumatic.  Mouth/Throat: Oropharynx is clear and moist.  Eyes: Conjunctivae and EOM are normal. Pupils are equal, round, and reactive to light. No scleral icterus.  Neck: Normal range of motion. No JVD present.  Pulmonary/Chest: Effort normal. No respiratory distress.  Musculoskeletal: Normal range of motion.  Neurological: He is alert and oriented to person, place, and time.  Skin: Skin is warm and dry.  Psychiatric: Affect normal.  Nursing note and vitals reviewed.   LABORATORY DATA:. Appointment on 02/04/2014  Component Date Value Ref Range Status  . TSH 02/04/2014 1.982  0.320 - 4.118 m(IU)/L Final  . WBC 02/04/2014 5.9  4.0 - 10.3 10e3/uL Final  . NEUT# 02/04/2014 4.0  1.5 - 6.5 10e3/uL Final  . HGB 02/04/2014 10.7* 13.0 - 17.1 g/dL Final  . HCT 02/04/2014 32.6* 38.4 - 49.9 % Final  . Platelets 02/04/2014 282  140 - 400 10e3/uL Final  . MCV 02/04/2014 106.9* 79.3 - 98.0 fL Final  . MCH 02/04/2014 35.2* 27.2 - 33.4 pg Final  . MCHC 02/04/2014 32.9  32.0 - 36.0 g/dL Final  . RBC 02/04/2014 3.05* 4.20 - 5.82 10e6/uL Final  . RDW 02/04/2014 18.0* 11.0 - 14.6 % Final  . lymph# 02/04/2014 0.8* 0.9 - 3.3 10e3/uL Final  . MONO# 02/04/2014 0.7  0.1 - 0.9 10e3/uL Final  . Eosinophils Absolute 02/04/2014 0.4  0.0 - 0.5 10e3/uL Final  . Basophils Absolute 02/04/2014 0.1  0.0 - 0.1 10e3/uL Final  . NEUT% 02/04/2014 67.4  39.0 - 75.0 % Final  . LYMPH% 02/04/2014 13.5* 14.0 - 49.0 % Final  . MONO% 02/04/2014 11.9  0.0 - 14.0 % Final  . EOS% 02/04/2014 6.3  0.0 - 7.0 % Final  . BASO% 02/04/2014 0.9  0.0 - 2.0 % Final  . Sodium 02/04/2014 139  136 - 145 mEq/L Final  . Potassium 02/04/2014 4.1  3.5 -  5.1 mEq/L Final  . Chloride 02/04/2014 106  98 - 109 mEq/L Final  . CO2 02/04/2014 27  22 - 29 mEq/L Final  . Glucose 02/04/2014 105  70 - 140 mg/dl Final  . BUN 02/04/2014 18.6  7.0 - 26.0 mg/dL Final  . Creatinine 02/04/2014 1.0  0.7 - 1.3 mg/dL Final  . Total Bilirubin 02/04/2014 0.28  0.20 - 1.20 mg/dL  Final  . Alkaline Phosphatase 02/04/2014 142  40 - 150 U/L Final  . AST 02/04/2014 25  5 - 34 U/L Final  . ALT 02/04/2014 21  0 - 55 U/L Final  . Total Protein 02/04/2014 6.7  6.4 - 8.3 g/dL Final  . Albumin 02/04/2014 3.2* 3.5 - 5.0 g/dL Final  . Calcium 02/04/2014 9.5  8.4 - 10.4 mg/dL Final  . Anion Gap 02/04/2014 6  3 - 11 mEq/L Final     RADIOGRAPHIC STUDIES: No results found.  ASSESSMENT/PLAN:   Lung Cancer:  Reviewed patient's lab results in detail with him today.  Patient does appear to be tolerating his nivolumab fairly well.  Will proceed today with cycle 3 of the new bottle mouth immunotherapy as previously directed.  Patient has plans to return on 02/18/2014 for cycle 4 of the same regimen.  Patient also has plans to followup with Dr. Servando Snare on 02/13/2014.  Hypoalbuminemia: Albumin has actually increased from 2.9 up to 3.2.  Patient was encouraged to continue to push protein is much as possible.  Patient stated understanding of all instructions; and was in agreement with this plan of care. The patient knows to call the clinic with any problems, questions or concerns.   Review/collaboration with Dr. Julien Nordmann regarding all aspects of patient's visit today.   Total time spent with patient was  minutes;  with greater than 80 percent of that time spent in face to face counseling regarding his symptoms,  and coordination of care and follow up.  Disclaimer: This note was dictated with voice recognition software. Similar sounding words can inadvertently be transcribed and may not be corrected upon review.   Drue Second, NP 02/05/2014

## 2014-02-05 NOTE — Telephone Encounter (Signed)
s.w. pt wife and advised on NOV appts...pt ok and aware

## 2014-02-06 ENCOUNTER — Encounter: Payer: Self-pay | Admitting: Nurse Practitioner

## 2014-02-06 NOTE — Assessment & Plan Note (Deleted)
Reviewed patient's lab results in detail with him today.  Patient does appear to be tolerating his nivolumab fairly well.  Will proceed today with cycle 3 of the new bottle mouth immunotherapy as previously directed.  Patient has plans to return on 02/18/2014 for cycle 4 of the same regimen.  Patient also has plans to followup with Dr. Servando Snare on 02/13/2014.

## 2014-02-06 NOTE — Assessment & Plan Note (Signed)
Albumin has actually increased from 2.9 up to 3.2.  Patient was encouraged to continue to push protein is much as possible.

## 2014-02-06 NOTE — Assessment & Plan Note (Signed)
Reviewed patient's lab results in detail with him today.  Patient does appear to be tolerating his nivolumab fairly well.  Will proceed today with cycle 3 of the new bottle mouth immunotherapy as previously directed.  Patient has plans to return on 02/18/2014 for cycle 4 of the same regimen.  Patient also has plans to followup with Dr. Servando Snare on 02/13/2014.

## 2014-02-07 ENCOUNTER — Encounter: Payer: Self-pay | Admitting: Cardiothoracic Surgery

## 2014-02-07 ENCOUNTER — Other Ambulatory Visit: Payer: Self-pay | Admitting: *Deleted

## 2014-02-09 ENCOUNTER — Encounter: Payer: Self-pay | Admitting: Nurse Practitioner

## 2014-02-09 ENCOUNTER — Other Ambulatory Visit: Payer: Self-pay | Admitting: Physician Assistant

## 2014-02-11 ENCOUNTER — Other Ambulatory Visit: Payer: Self-pay | Admitting: *Deleted

## 2014-02-11 DIAGNOSIS — C3411 Malignant neoplasm of upper lobe, right bronchus or lung: Secondary | ICD-10-CM

## 2014-02-11 MED ORDER — OXYCODONE-ACETAMINOPHEN 5-325 MG PO TABS: 1.0000 | ORAL_TABLET | Freq: Four times a day (QID) | ORAL | Status: DC | PRN

## 2014-02-11 MED ORDER — HYDROCODONE-HOMATROPINE 5-1.5 MG/5ML PO SYRP: 5.0000 mL | ORAL_SOLUTION | Freq: Four times a day (QID) | ORAL | Status: DC | PRN

## 2014-02-13 ENCOUNTER — Ambulatory Visit (INDEPENDENT_AMBULATORY_CARE_PROVIDER_SITE_OTHER): Payer: Medicare Other | Admitting: Cardiothoracic Surgery

## 2014-02-13 ENCOUNTER — Other Ambulatory Visit: Payer: Self-pay | Admitting: Cardiothoracic Surgery

## 2014-02-13 ENCOUNTER — Encounter: Payer: Self-pay | Admitting: Cardiothoracic Surgery

## 2014-02-13 ENCOUNTER — Ambulatory Visit
Admission: RE | Admit: 2014-02-13 | Discharge: 2014-02-13 | Disposition: A | Discharge status: Home / Self Care | Payer: Medicare Other | Source: Ambulatory Visit | Attending: Cardiothoracic Surgery | Admitting: Cardiothoracic Surgery

## 2014-02-13 VITALS — BP 97/62 | HR 76 | Resp 16 | Ht 74.0 in | Wt 196.5 lb

## 2014-02-13 DIAGNOSIS — C3411 Malignant neoplasm of upper lobe, right bronchus or lung: Secondary | ICD-10-CM

## 2014-02-13 DIAGNOSIS — Z902 Acquired absence of lung [part of]: Secondary | ICD-10-CM

## 2014-02-13 DIAGNOSIS — Z9889 Other specified postprocedural states: Secondary | ICD-10-CM

## 2014-02-13 NOTE — Progress Notes (Signed)
[address]       [place] [address]             [phone]      Terry Robles [facility] Medical Record #[id number] Date of Birth: 1940-05-25  Referring: Curt Bears, MD Primary Care: [name], MD  Chief Complaint:   POST OP FOLLOW UP 07/30/2013  OPERATIVE REPORT  PREOPERATIVE DIAGNOSIS: Non-small cell lung cancer, right upper lobe  with chest wall invasion.  POSTOPERATIVE DIAGNOSIS: Non-small cell lung cancer, right upper lobe  with chest wall invasion.  SURGICAL PROCEDURE: Bronchoscopy, mediastinoscopy, right posterior  lateral thoracotomy with right upper lobectomy, lymph node dissection,  and posterior approach to en bloc chest wall resection, ribs 2, 3, and  4.  History of Present Illness:     Patient returns to the office stay in followup after his right upper lobectomy lymph node dissection on block resection of the upper right chest wall and recent admission for rPTX on the right treated with chest tube. Since last seen the patient has been started on immune therapy for his metastatic lung cancer which was not responding to conventional chemotherapy. He notes after the first dose the first dose the left chest wall discomfort improved and has continued to do so. He is still plagued by some dry hacking cough at times.  Past Medical History  Diagnosis Date  . Hypercholesteremia 05/14/2011  . BPH (benign prostatic hyperplasia) 05/14/2011    takes Uroxatral daily  . GERD (gastroesophageal reflux disease)   . 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 Cancer     squamous cell carcinoma right superior sulcus  . Lung cancer     right lung  . Shortness of breath     weather related  . Pneumonia 1965  . Headache(784.0)     occasionally  . Joint pain   . Maintenance chemotherapy   . Urinary frequency   . History of shingles   . Anemia   . Acquired thrombocytopenia      History  Smoking status  . Former Smoker -- 1.00  packs/day for 42 years  . Types: Cigarettes  Smokeless tobacco  . Never Used    Comment: quit smoking almost 32yrs ago    History  Alcohol Use  . Yes    Comment: occasionally beer     No Known Allergies  Current Outpatient Prescriptions  Medication Sig Dispense Refill  . alfuzosin (UROXATRAL) 10 MG 24 hr tablet Take 10 mg by mouth daily with breakfast.    . cycloSPORINE (RESTASIS) 0.05 % ophthalmic emulsion 1 drop 2 (two) times daily.    Marland Kitchen docusate sodium (COLACE) 100 MG capsule Take 100 mg by mouth daily.    . feeding supplement, RESOURCE BREEZE, (RESOURCE BREEZE) LIQD Take 1 Container by mouth daily at 3 pm.  0  . HYDROcodone-homatropine (HYCODAN) 5-1.5 MG/5ML syrup Take 5 mLs by mouth every 6 (six) hours as needed for cough. 240 mL 0  . Multiple Vitamin (MULITIVITAMIN WITH MINERALS) TABS Take 1 tablet by mouth daily.    Marland Kitchen oxyCODONE-acetaminophen (PERCOCET/ROXICET) 5-325 MG per tablet Take 1 tablet by mouth every 6 (six) hours as needed for severe pain. 60 tablet 0  . polyethylene glycol (MIRALAX / GLYCOLAX) packet Take 17 g by mouth daily.    . prochlorperazine (COMPAZINE) 5 MG tablet Take 5 mg by mouth every 6 (six) hours as needed for nausea or vomiting.  No current facility-administered medications for this visit.       Physical Exam: BP 97/62 mmHg  Pulse 76  Resp 16  Ht 6\' 2"  (1.88 m)  Wt 196 lb 8 oz (89.132 kg)  BMI 25.22 kg/m2  SpO2 99%  General appearance: alert and cooperative Neurologic: intact Heart: regular rate and rhythm, S1, S2 normal, no murmur, click, rub or gallop Lungs: clear to auscultation bilaterally Abdomen: soft, non-tender; bowel sounds normal; no masses,  no organomegaly Extremities: extremities normal, atraumatic, no cyanosis or edema and Homans sign is negative, no sign of DVT Wound: His right thoracotomy incision is well-healed, I do not appreciate cervical supraclavicular or axillary adenopathy,   Diagnostic Studies & Laboratory  data:     Recent Radiology Findings:  Dg Chest 2 View  02/13/2014   CLINICAL DATA:  History of right apical squamous cell carcinoma with resection,, interval development of lytic lesion in the left chest wall with chemotherapy, followup  EXAM: CHEST  2 VIEW  COMPARISON:  Chest x-ray of 01/03/2014  FINDINGS: Volume loss in the right hemi thorax is unchanged with apical and basilar pleural thickening. The lytic lesion involving the left posterior seventh rib has diminished in size after interval chemotherapy. No new abnormality is seen. No effusion is noted. The left-sided Port-A-Cath is unchanged in position with the tip near the expected SVC -RA junction. There are diffuse degenerative changes throughout the thoracic spine.  IMPRESSION: 1. Decrease in size of soft tissue mass associated with the lytic lesion of the left posterolateral seventh rib. 2. No change in volume loss on the right secondary to prior surgery for resection of right apical squamous cell carcinoma.   Electronically Signed   By: Ivar Drape M.D.   On: 02/13/2014 13:07    Dg Chest 2 View  01/03/2014   CLINICAL DATA:  Followup pneumothorax.  Lung cancer  EXAM: CHEST  2 VIEW  COMPARISON:  CT chest 12/27/2013, CXR 12/23/2013  FINDINGS: Postop resection right apex. There is right apical pleural thickening as well as rib resection. No pneumothorax seen on the chest x-ray. Mild right pleural thickening or fluid in the base.  Metastatic disease to the left seventh rib with soft tissue mass and bony destruction, stable.  Negative for heart failure or pneumonia. Port-A-Cath tip remains of the cavoatrial junction.  IMPRESSION: No pneumothorax identified  Metastatic disease left seventh rib is stable.   Electronically Signed   By: Franchot Gallo M.D.   On: 01/03/2014 15:31    Ct Chest W Contrast  12/27/2013   CLINICAL DATA:  Subsequent encounter for right-sided Lung cancer. Squamous cell carcinoma of the right upper lobe.  EXAM: CT CHEST,  ABDOMEN, AND PELVIS WITH CONTRAST  TECHNIQUE: Multidetector CT imaging of the chest, abdomen and pelvis was performed following the standard protocol during bolus administration of intravenous contrast.  CONTRAST:  182mL OMNIPAQUE IOHEXOL 300 MG/ML  SOLN  COMPARISON:  PET-CT from 10/18/2013.  Chest CT from 10/11/2013.  FINDINGS: CT CHEST FINDINGS  Soft tissue / Mediastinum: The tip of the left-sided Port-A-Cath is positioned at the SVC/ RA junction. There is no axillary lymphadenopathy. 10 mm prevascular short axis lymph node (image 7 series 2) is new in the interval. 10 mm short axis precarinal lymph node was 5 mm previously. 14 mm short axis subcarinal lymph node on the previous study has increased in size to 19 mm today. No left hilar lymphadenopathy. A amorphous soft tissue in the right hilum is likely related to  post treatment change.  3.1 cm posterior lateral left chest wall lesion on the previous study is now 3.2 cm in the same dimension however, when measuring in long axis, this same lesion has increased in size from 3.7 cm previously to 5.8 cm currently.  Tiny chest wall nodule seen on image 42 series 2 today has not increased in size in the interval, but was hypermetabolic on the previous PET-CT.  Lungs / Pleura: Tiny air-fluid level in the right apex is new in the interval. Airspace opacity in the posterior right upper lobe is stable and may represent postradiation fibrosis. This is underlying the previous on bloc resection.  Lungs are diffusely emphysematous.  Tiny posterior right pleural effusion has has decreased in the interval.  Bones: Bone windows reveal no worrisome lytic or sclerotic osseous lesions.  CT ABDOMEN AND PELVIS FINDINGS  Hepatobiliary: No focal abnormality within the liver parenchyma. Multiple tiny stones are seen within the gallbladder. No intrahepatic or extrahepatic biliary dilation.  Pancreas: No focal mass lesion. No dilatation of the main duct. No intraparenchymal cyst. No  peripancreatic edema.  Spleen: No splenomegaly. No focal mass lesion.  Adrenals/Urinary Tract: No adrenal nodule or mass. Kidneys are unremarkable. No ureteral dilatation. Urinary bladder has normal CT imaging features.  Stomach/Bowel: Stomach is nondistended. No gastric wall thickening. No evidence of outlet obstruction. Duodenum is normally positioned as is the ligament of Treitz. No small bowel wall thickening. No small bowel dilatation. Terminal ileum is normal. Appendix measures up to 11 mm in diameter which is technically dilated, but it was 9 mm previously. The lumen is filled with contrast, debris, and air. No evidence for wall thickening or periappendiceal edema/inflammation to suggest acute appendicitis. Mild diverticular change seen in the sigmoid colon without diverticulitis.  Vascular/Lymphatic: Atherosclerotic calcification is noted in the wall of the abdominal aorta without aneurysm. No gastrohepatic or hepatoduodenal ligament lymphadenopathy. No retroperitoneal lymphadenopathy. No pelvic sidewall lymphadenopathy.  Reproductive: Prostate gland is enlarged.  Other: Left inguinal hernia contains only fat. No intraperitoneal free fluid.  Musculoskeletal: Bone windows reveal no worrisome lytic or sclerotic osseous lesions.  IMPRESSION: Interval progression of mediastinal lymphadenopathy compatible with worsening metastatic disease. Dominant left-sided chest wall lesion also shows clear interval progression.  Slight interval decrease in the small right pleural effusion.  No evidence for metastatic disease in the abdomen or pelvis.   Electronically Signed   By: Misty Stanley M.D.   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 The patient appears to be responding to the current immune therapy, with decreased  left chest wall pain and at least by plain chest x-ray a decrease in size of soft tissue lytic lesion. Patient notes he has an additional treatment next week and will have a follow-up CT scan arranged at that time. Plan to see him back in 2 months   Grace Isaac MD      [address] [place] [address] Office [phone]   Beeper [phone]  02/13/2014 1:44 PM

## 2014-02-18 ENCOUNTER — Encounter: Payer: Self-pay | Admitting: Physician Assistant

## 2014-02-18 ENCOUNTER — Ambulatory Visit (HOSPITAL_BASED_OUTPATIENT_CLINIC_OR_DEPARTMENT_OTHER): Payer: Medicare Other | Admitting: Physician Assistant

## 2014-02-18 ENCOUNTER — Other Ambulatory Visit (HOSPITAL_BASED_OUTPATIENT_CLINIC_OR_DEPARTMENT_OTHER): Payer: Medicare Other

## 2014-02-18 ENCOUNTER — Telehealth: Payer: Self-pay | Admitting: Internal Medicine

## 2014-02-18 ENCOUNTER — Ambulatory Visit (HOSPITAL_BASED_OUTPATIENT_CLINIC_OR_DEPARTMENT_OTHER): Payer: Medicare Other

## 2014-02-18 ENCOUNTER — Ambulatory Visit: Payer: Medicare Other

## 2014-02-18 VITALS — BP 100/58 | HR 80 | Temp 97.9°F | Resp 18 | Ht 74.0 in | Wt 190.9 lb

## 2014-02-18 DIAGNOSIS — C3492 Malignant neoplasm of unspecified part of left bronchus or lung: Secondary | ICD-10-CM

## 2014-02-18 DIAGNOSIS — C3411 Malignant neoplasm of upper lobe, right bronchus or lung: Secondary | ICD-10-CM

## 2014-02-18 DIAGNOSIS — Z95828 Presence of other vascular implants and grafts: Secondary | ICD-10-CM

## 2014-02-18 DIAGNOSIS — Z5112 Encounter for antineoplastic immunotherapy: Secondary | ICD-10-CM

## 2014-02-18 DIAGNOSIS — R5383 Other fatigue: Secondary | ICD-10-CM

## 2014-02-18 LAB — COMPREHENSIVE METABOLIC PANEL (CC13)
ALT: 21 U/L (ref 0–55)
ANION GAP: 8 meq/L (ref 3–11)
AST: 21 U/L (ref 5–34)
Albumin: 3.2 g/dL — ABNORMAL LOW (ref 3.5–5.0)
Alkaline Phosphatase: 127 U/L (ref 40–150)
BUN: 15.7 mg/dL (ref 7.0–26.0)
CALCIUM: 9.4 mg/dL (ref 8.4–10.4)
CHLORIDE: 104 meq/L (ref 98–109)
CO2: 26 meq/L (ref 22–29)
CREATININE: 0.8 mg/dL (ref 0.7–1.3)
Glucose: 99 mg/dl (ref 70–140)
Potassium: 4.1 mEq/L (ref 3.5–5.1)
Sodium: 138 mEq/L (ref 136–145)
Total Bilirubin: 0.3 mg/dL (ref 0.20–1.20)
Total Protein: 6.5 g/dL (ref 6.4–8.3)

## 2014-02-18 LAB — CBC WITH DIFFERENTIAL/PLATELET
BASO%: 0.6 % (ref 0.0–2.0)
BASOS ABS: 0 10*3/uL (ref 0.0–0.1)
EOS%: 4.4 % (ref 0.0–7.0)
Eosinophils Absolute: 0.2 10*3/uL (ref 0.0–0.5)
HEMATOCRIT: 34.3 % — AB (ref 38.4–49.9)
HGB: 11.4 g/dL — ABNORMAL LOW (ref 13.0–17.1)
LYMPH#: 0.8 10*3/uL — AB (ref 0.9–3.3)
LYMPH%: 13.3 % — ABNORMAL LOW (ref 14.0–49.0)
MCH: 35.1 pg — AB (ref 27.2–33.4)
MCHC: 33.1 g/dL (ref 32.0–36.0)
MCV: 105.9 fL — ABNORMAL HIGH (ref 79.3–98.0)
MONO#: 0.7 10*3/uL (ref 0.1–0.9)
MONO%: 11.7 % (ref 0.0–14.0)
NEUT#: 4 10*3/uL (ref 1.5–6.5)
NEUT%: 70 % (ref 39.0–75.0)
PLATELETS: 230 10*3/uL (ref 140–400)
RBC: 3.24 10*6/uL — ABNORMAL LOW (ref 4.20–5.82)
RDW: 15.3 % — ABNORMAL HIGH (ref 11.0–14.6)
WBC: 5.7 10*3/uL (ref 4.0–10.3)

## 2014-02-18 MED ORDER — NIVOLUMAB CHEMO INJECTION 100 MG/10ML
3.0000 mg/kg | Freq: Once | INTRAVENOUS | Status: AC
  Administered 2014-02-18: 250 mg via INTRAVENOUS
  Filled 2014-02-18: qty 25

## 2014-02-18 MED ORDER — HEPARIN SOD (PORK) LOCK FLUSH 100 UNIT/ML IV SOLN
500.0000 [IU] | Freq: Once | INTRAVENOUS | Status: AC | PRN
  Administered 2014-02-18: 500 [IU]
  Filled 2014-02-18: qty 5

## 2014-02-18 MED ORDER — SODIUM CHLORIDE 0.9 % IJ SOLN
10.0000 mL | INTRAMUSCULAR | Status: DC | PRN
  Administered 2014-02-18: 10 mL via INTRAVENOUS
  Filled 2014-02-18: qty 10

## 2014-02-18 MED ORDER — SODIUM CHLORIDE 0.9 % IV SOLN
Freq: Once | INTRAVENOUS | Status: AC
  Administered 2014-02-18: 13:00:00 via INTRAVENOUS

## 2014-02-18 MED ORDER — SODIUM CHLORIDE 0.9 % IJ SOLN
10.0000 mL | INTRAMUSCULAR | Status: DC | PRN
  Administered 2014-02-18: 10 mL
  Filled 2014-02-18: qty 10

## 2014-02-18 NOTE — Patient Instructions (Signed)
[facility] Discharge Instructions for Patients Receiving Chemotherapy  Today you received the following chemotherapy agents Nivolumab.  To help prevent nausea and vomiting after your treatment, we encourage you to take your nausea medication as directed.    If you develop nausea and vomiting that is not controlled by your nausea medication, call the clinic.   BELOW ARE SYMPTOMS THAT SHOULD BE REPORTED IMMEDIATELY:  *FEVER GREATER THAN 100.5 F  *CHILLS WITH OR WITHOUT FEVER  NAUSEA AND VOMITING THAT IS NOT CONTROLLED WITH YOUR NAUSEA MEDICATION  *UNUSUAL SHORTNESS OF BREATH  *UNUSUAL BRUISING OR BLEEDING  TENDERNESS IN MOUTH AND THROAT WITH OR WITHOUT PRESENCE OF ULCERS  *URINARY PROBLEMS  *BOWEL PROBLEMS  UNUSUAL RASH Items with * indicate a potential emergency and should be followed up as soon as possible.  Feel free to call the clinic you have any questions or concerns. The clinic phone number is [phone].

## 2014-02-18 NOTE — Patient Instructions (Signed)
Continue labs and chemotherapy as scheduled  Follow up in 2 weeks with a restaging CT scan of your chest, abdomen and pelvis to re-evaluate your disease

## 2014-02-18 NOTE — Telephone Encounter (Signed)
gv adn printed appt sched and avs fo rpt for DEc...sed added tx....gv pt barium

## 2014-02-18 NOTE — Patient Instructions (Signed)

## 2014-02-18 NOTE — Progress Notes (Addendum)
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   Primary site: Lung (Right)   Staging method: AJCC 7th Edition   Clinical: Stage IIIA (T3, N2, M0) signed by Terry Bears, MD on 05/09/2013  2:56 PM   Summary: Stage IIIA (T3, N2, M0)  PRIOR THERAPY:  1) Concurrent chemoradiation with weekly carboplatin for an AUC of 2 and paclitaxel 45 mg per meter squared. Status post 5 cycles. 2) Bronchoscopy, mediastinoscopy, right posterior lateral thoracotomy with right upper lobectomy, lymph node dissection,  and posterior approach to en bloc chest wall resection, ribs 2, 3, and 4, with positive bronchial resection margin. 3) palliative radiotherapy to the best of resection margin under the care of Dr. Tammi Klippel completed on 10/14/2013. 4) systemic chemotherapy with carboplatin for an AUC of 5 given on day 1 and Abraxane 100 mg/m given days 1, 8 and 15 every 3 weeks status post 3 cycles discontinued 12/31/2013 secondary to disease progression  CURRENT THERAPY: Immunotherapy with Nivolumab 3 mg/kg given every 2 weeks.  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    INTERVAL HISTORY: Terry Robles 73 y.o. male returns for followup visit. Overall is tolerating his treatment with immunotherapy with  Nivolumab without significant difficulty. He denies any change in his baseline shortness of breath, no diarrhea or skin rash. He continues to have some residual numbness/tingling in his toes as well as his right hand from previous chemotherapy. He has a cough which is been long-standing primarily dry although occasionally productive of clear secretions. This is well managed with Delsym cough syrup during the day and Hycodan cough syrup at night. He continues to complain of pain in both hips since the start of treatment. His been taking Percocet to 3 times a day with reasonable relief. He voiced no other specific complaints today.  He has no hemoptysis. He denied having any significant weight loss or night sweats. He has no nausea or vomiting.    MEDICAL HISTORY: Past Medical History  Diagnosis Date  . Hypercholesteremia 05/14/2011  . BPH (benign prostatic hyperplasia) 05/14/2011    takes Uroxatral daily  . GERD (gastroesophageal reflux disease)   . 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 Cancer     squamous cell carcinoma right superior sulcus  . Lung cancer     right lung  . Shortness of breath     weather related  . Pneumonia 1965  . Headache(784.0)     occasionally  . Joint pain   . Maintenance chemotherapy   . Urinary frequency   .  History of shingles   . Anemia   . Acquired thrombocytopenia     ALLERGIES:  has No Known Allergies.  MEDICATIONS:  Current Outpatient Prescriptions  Medication Sig Dispense Refill  . alfuzosin (UROXATRAL) 10 MG 24 hr tablet Take 10 mg by mouth daily with breakfast.    . cycloSPORINE (RESTASIS) 0.05 % ophthalmic emulsion 1 drop 2 (two) times daily.    Marland Kitchen docusate sodium (COLACE) 100 MG capsule  Take 100 mg by mouth daily.    . feeding supplement, RESOURCE BREEZE, (RESOURCE BREEZE) LIQD Take 1 Container by mouth daily at 3 pm.  0  . HYDROcodone-homatropine (HYCODAN) 5-1.5 MG/5ML syrup Take 5 mLs by mouth every 6 (six) hours as needed for cough. 240 mL 0  . Multiple Vitamin (MULITIVITAMIN WITH MINERALS) TABS Take 1 tablet by mouth daily.    Marland Kitchen oxyCODONE-acetaminophen (PERCOCET/ROXICET) 5-325 MG per tablet Take 1 tablet by mouth every 6 (six) hours as needed for severe pain. 60 tablet 0  . polyethylene glycol (MIRALAX / GLYCOLAX) packet Take 17 g by mouth daily.    . prochlorperazine (COMPAZINE) 5 MG tablet Take 5 mg by mouth every 6 (six) hours as needed for nausea or vomiting.     No current facility-administered medications for this visit.   Facility-Administered Medications Ordered in Other Visits  Medication Dose Route Frequency Provider Last Rate Last Dose  . 0.9 %  sodium chloride infusion   Intravenous Once Terry Bears, MD      . sodium chloride 0.9 % injection 10 mL  10 mL Intracatheter PRN Terry Bears, MD   10 mL at 02/18/14 1441    SURGICAL HISTORY:  Past Surgical History  Procedure Laterality Date  . Other surgical history      benign tumor removed from left leg several years ago  . Other surgical history      had melanoma/squamous cell removed previously per patient  . Tumor excision      Left knee  . Colonoscopy w/ biopsies and polypectomy    . Video bronchoscopy N/A 07/30/2013    Procedure: VIDEO BRONCHOSCOPY;  Surgeon: Grace Isaac, MD;  Location: [facility];  Service: Thoracic;  Laterality: N/A;  . Mediastinoscopy N/A 07/30/2013    Procedure: MEDIASTINOSCOPY;  Surgeon: Grace Isaac, MD;  Location: [facility];  Service: Thoracic;  Laterality: N/A;  . Video assisted thoracoscopy (vats)/wedge resection Right 07/30/2013    Procedure: VIDEO ASSISTED THORACOSCOPY;  Surgeon: Grace Isaac, MD;  Location: [facility];  Service: Thoracic;  Laterality: Right;  .  Thoracotomy  07/30/2013    Procedure: THORACOTOMY FOR CHEST WALL RESECTION, RIGHT UPPER LOBECTOMY;  Surgeon: Grace Isaac, MD;  Location: [facility];  Service: Thoracic;;  . Adenoidectomy  as a child  . Tonsillectomy    . Portacath placement Left 10/30/2013    Procedure: INSERTION PORT-A-CATH WITH ULTRASOUND GUIDANCE AND FLUORO;  Surgeon: Grace Isaac, MD;  Location: [facility];  Service: Thoracic;  Laterality: Left;    REVIEW OF SYSTEMS:  Constitutional: positive for fatigue Eyes: negative Ears, nose, mouth, throat, and face: negative Respiratory: positive for dyspnea on exertion Cardiovascular: negative Gastrointestinal: negative Genitourinary:negative Integument/breast: negative Hematologic/lymphatic: negative Musculoskeletal:positive for arthralgias Neurological: negative Behavioral/Psych: negative Endocrine: negative Allergic/Immunologic: negative   PHYSICAL EXAMINATION: General appearance: alert, cooperative, appears stated age and no distress Head: Normocephalic, without obvious abnormality, atraumatic Neck: no adenopathy, no carotid bruit, no JVD, supple, symmetrical, trachea midline and thyroid not enlarged, symmetric, no tenderness/mass/nodules Lymph nodes: Cervical, supraclavicular, and  axillary nodes normal. Resp: clear to auscultation bilaterally Back: symmetric, no curvature. ROM normal. No CVA tenderness. Cardio: regular rate and rhythm, S1, S2 normal, no murmur, click, rub or gallop GI: soft, non-tender; bowel sounds normal; no masses,  no organomegaly Extremities: extremities normal, atraumatic, no cyanosis or edema and The left forearm and hand are cooler to touch than the right Neurologic: Alert and oriented X 3, normal strength and tone. Normal symmetric reflexes. Normal coordination and gait Skin: Left anterior chest Port-A-Cath incisions are well healed, no evidence of infection  ECOG PERFORMANCE STATUS: 1 - Symptomatic but completely ambulatory  Blood pressure  100/58, pulse 80, temperature 97.9 F (36.6 C), temperature source Oral, resp. rate 18, height 6\' 2"  (1.88 m), weight 190 lb 14.4 oz (86.592 kg), SpO2 99 %.  LABORATORY DATA: Lab Results  Component Value Date   WBC 5.7 02/18/2014   HGB 11.4* 02/18/2014   HCT 34.3* 02/18/2014   MCV 105.9* 02/18/2014   PLT 230 02/18/2014      Chemistry      Component Value Date/Time   NA 138 02/18/2014 1157   NA 140 12/19/2013 0405   K 4.1 02/18/2014 1157   K 4.2 12/19/2013 0405   CL 104 12/19/2013 0405   CO2 26 02/18/2014 1157   CO2 26 12/19/2013 0405   BUN 15.7 02/18/2014 1157   BUN 14 12/19/2013 0405   CREATININE 0.8 02/18/2014 1157   CREATININE 0.87 12/19/2013 0405      Component Value Date/Time   CALCIUM 9.4 02/18/2014 1157   CALCIUM 9.0 12/19/2013 0405   ALKPHOS 127 02/18/2014 1157   ALKPHOS 126* 12/18/2013 1610   AST 21 02/18/2014 1157   AST 17 12/18/2013 1610   ALT 21 02/18/2014 1157   ALT 16 12/18/2013 1610   BILITOT 0.30 02/18/2014 1157   BILITOT 0.4 12/18/2013 1610       RADIOGRAPHIC STUDIES: Dg Chest 2 View  10/10/2013   CLINICAL DATA:  History right lung cancer, status post surgery 07/2013  EXAM: CHEST  2 VIEW  COMPARISON:  Chest radiograph dated 08/26/2013 CT chest dated 07/24/2013  FINDINGS: Postsurgical changes in the right upper hemithorax with volume loss and increased right apical capping/fluid.  Pleural-based lesion along the left lateral hemithorax, new/increased, with destruction of the left lateral 7th rib.  No pneumothorax.  The heart is normal in size.  Degenerative changes of the visualized thoracolumbar spine.  IMPRESSION: Postsurgical changes in the right upper hemithorax with increased right apical capping/fluid.  Pleural-based lesion along the left lateral hemithorax, new/increased, with destruction of the left lateral 7th rib.  Consider CT chest with contrast for further evaluation.  These results will be called to the ordering clinician or representative by  the Radiologist Assistant, and communication documented in the PACS or zVision Dashboard.   Electronically Signed   By: Julian Hy M.D.   On: 10/10/2013 09:42   Ct Chest W Contrast  10/11/2013   CLINICAL DATA:  Followup metastatic lung carcinoma. Recent right thoracotomy. On going radiation therapy. Recently completed chemotherapy.  EXAM: CT CHEST WITH CONTRAST  TECHNIQUE: Multidetector CT imaging of the chest was performed during intravenous contrast administration.  CONTRAST:  68mL OMNIPAQUE IOHEXOL 300 MG/ML  SOLN  COMPARISON:  07/24/2013  FINDINGS: Patient has undergone right thoracotomy and right upper lobectomy since prior exam. Parenchymal consolidation and central air bronchograms is seen in the right posterior upper lung field, with surgical clips in the adjacent right hilum. 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 A small right pleural effusion is noted, however there is no evidence of associated pleural nodularity or chest wall mass. No suspicious pulmonary nodules or masses are seen involving either lung. Mild emphysema noted. No evidence of left-sided pleural effusion.  No hilar lymphadenopathy identified. Mild mediastinal adenopathy in the subcarinal region shows increased since previous study, with lymph node measuring 16 mm on image 27 compared with 7 mm previously. No other pathologically enlarged nodes are seen within the thorax.  A left posterior chest wall mass is seen with destruction of left posterior lateral seventh rib. This measures 3.6 x 2.7 cm on image 30. This is new since previous study and consistent with a lytic bone metastasis. No other suspicious bone lesions seen within the thorax.  Both adrenal glands are normal in appearance. Tiny calcified gallstones again seen, however there is no evidence of cholecystitis. A subcutaneous lesion is seen in the left anterior chest wall measuring 1.7 x 5.3 cm which is stable and consistent with a sebaceous cyst.  IMPRESSION:  Postop changes from interval right upper lobectomy. 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 Small right pleural effusion also noted.  Increased mild mediastinal lymphadenopathy in the subcarinal region, suspicious for metastatic disease.  New lytic bone metastasis with associated chest wall mass involving the left posterior seventh rib.   Electronically Signed   By: Earle Gell M.D.   On: 10/11/2013 14:39   ASSESSMENT/PLAN:  This is a very pleasant 73 years old white male recently diagnosed with a stage IIIA non-small cell lung cancer. He completed a course of concurrent chemoradiation with weekly carboplatin and paclitaxel status post 5 cycle, this was followed by right upper lobectomy with lymph node dissection under the care of Dr. Servando Snare. The final pathology showed positive bronchial resection margin with a pathologic stage ypT3, ypN0. The patient underwent a course of radiotherapy to the positive resection margin but unfortunately the recent CT scan of the chest showed evidence for disease progression with new lytic rib lesion as well as enlarged mediastinal lymphadenopathy. The MRI of the brain was negative for brain metastasis. He is status post 3 cycles of systemic chemotherapy with carboplatin for AUC of 5 on day 1 and Abraxane 100 mg/M2 on days 1, 8 and 15 every 3 weeks. However his restaging CT scan showed evidence for disease progression. He is now being treated with immunotherapy with Nivolumab. He is status post 3 cycles. Patient was discussed with and also seen by Dr. Julien Nordmann. He will proceed with cycle #4 today as scheduled. He will follow-up in 2 weeks of the restaging CT scan of the chest, abdomen and pelvis with contrast to reevaluate his disease.  He was advised to call immediately if he has any concerning symptoms in the interval.  Disclaimer: This note was dictated with voice recognition software. Similar  sounding words can inadvertently be transcribed and may not be corrected upon review. Carlton Adam, PA-C 02/18/2014  ADDENDUM: Hematology/Oncology Attending: I had a face to face encounter with the patient today. I recommended his care plan. This is a very pleasant 73 years old white male with metastatic non-small cell lung cancer, squamous cell carcinoma was currently undergoing treatment with immunotherapy with Nivolumab status post 3 cycles. The patient is tolerating his treatment fairly well with no significant adverse effects. He denied having any significant nausea or vomiting, no diarrhea. The patient denied having any fever or chills. He has no  skin rash. He continues to complain of fatigue. His recent MRI of the brain was negative for brain metastasis. I recommended for the patient to proceed with cycle #4 today as scheduled. He would come back for follow-up visit in 2 weeks for reevaluation with repeat CT scan of the chest, abdomen and pelvis for restaging of his disease. He was advised to call immediately if he has any concerning symptoms in the interval.  Disclaimer: This note was dictated with voice recognition software. Similar sounding words can inadvertently be transcribed and may be missed upon review. [name]., MD 02/18/2014

## 2014-02-28 ENCOUNTER — Ambulatory Visit (HOSPITAL_COMMUNITY)
Admission: RE | Admit: 2014-02-28 | Discharge: 2014-02-28 | Disposition: A | Discharge status: Home / Self Care | Payer: Medicare Other | Source: Ambulatory Visit | Attending: Physician Assistant | Admitting: Physician Assistant

## 2014-02-28 ENCOUNTER — Encounter (HOSPITAL_COMMUNITY): Payer: Self-pay

## 2014-02-28 DIAGNOSIS — R634 Abnormal weight loss: Secondary | ICD-10-CM | POA: Diagnosis not present

## 2014-02-28 DIAGNOSIS — R590 Localized enlarged lymph nodes: Secondary | ICD-10-CM | POA: Insufficient documentation

## 2014-02-28 DIAGNOSIS — Z87891 Personal history of nicotine dependence: Secondary | ICD-10-CM | POA: Diagnosis not present

## 2014-02-28 DIAGNOSIS — R05 Cough: Secondary | ICD-10-CM | POA: Diagnosis not present

## 2014-02-28 DIAGNOSIS — J90 Pleural effusion, not elsewhere classified: Secondary | ICD-10-CM | POA: Diagnosis not present

## 2014-02-28 DIAGNOSIS — C3490 Malignant neoplasm of unspecified part of unspecified bronchus or lung: Secondary | ICD-10-CM | POA: Insufficient documentation

## 2014-02-28 DIAGNOSIS — Z9221 Personal history of antineoplastic chemotherapy: Secondary | ICD-10-CM | POA: Diagnosis not present

## 2014-02-28 DIAGNOSIS — M25511 Pain in right shoulder: Secondary | ICD-10-CM | POA: Diagnosis not present

## 2014-02-28 DIAGNOSIS — Z923 Personal history of irradiation: Secondary | ICD-10-CM | POA: Diagnosis not present

## 2014-02-28 DIAGNOSIS — R0602 Shortness of breath: Secondary | ICD-10-CM | POA: Diagnosis not present

## 2014-02-28 DIAGNOSIS — C3411 Malignant neoplasm of upper lobe, right bronchus or lung: Secondary | ICD-10-CM

## 2014-02-28 MED ORDER — IOHEXOL 300 MG/ML  SOLN
100.0000 mL | Freq: Once | INTRAMUSCULAR | Status: AC | PRN
  Administered 2014-02-28: 100 mL via INTRAVENOUS

## 2014-03-04 ENCOUNTER — Other Ambulatory Visit (HOSPITAL_BASED_OUTPATIENT_CLINIC_OR_DEPARTMENT_OTHER): Payer: Medicare Other

## 2014-03-04 ENCOUNTER — Ambulatory Visit (HOSPITAL_BASED_OUTPATIENT_CLINIC_OR_DEPARTMENT_OTHER): Payer: Medicare Other

## 2014-03-04 ENCOUNTER — Encounter: Payer: Self-pay | Admitting: Physician Assistant

## 2014-03-04 ENCOUNTER — Ambulatory Visit (HOSPITAL_BASED_OUTPATIENT_CLINIC_OR_DEPARTMENT_OTHER): Payer: Medicare Other | Admitting: Physician Assistant

## 2014-03-04 ENCOUNTER — Ambulatory Visit: Payer: Medicare Other

## 2014-03-04 ENCOUNTER — Telehealth: Payer: Self-pay | Admitting: Physician Assistant

## 2014-03-04 VITALS — BP 102/55 | HR 86 | Temp 98.1°F | Resp 18 | Ht 74.0 in | Wt 191.9 lb

## 2014-03-04 DIAGNOSIS — C7951 Secondary malignant neoplasm of bone: Secondary | ICD-10-CM

## 2014-03-04 DIAGNOSIS — C3411 Malignant neoplasm of upper lobe, right bronchus or lung: Secondary | ICD-10-CM

## 2014-03-04 DIAGNOSIS — Z5112 Encounter for antineoplastic immunotherapy: Secondary | ICD-10-CM

## 2014-03-04 DIAGNOSIS — Z95828 Presence of other vascular implants and grafts: Secondary | ICD-10-CM

## 2014-03-04 DIAGNOSIS — R5382 Chronic fatigue, unspecified: Secondary | ICD-10-CM

## 2014-03-04 DIAGNOSIS — C3492 Malignant neoplasm of unspecified part of left bronchus or lung: Secondary | ICD-10-CM

## 2014-03-04 DIAGNOSIS — R591 Generalized enlarged lymph nodes: Secondary | ICD-10-CM

## 2014-03-04 LAB — CBC WITH DIFFERENTIAL/PLATELET
BASO%: 0 % (ref 0.0–2.0)
BASOS ABS: 0 10*3/uL (ref 0.0–0.1)
EOS%: 4.4 % (ref 0.0–7.0)
Eosinophils Absolute: 0.2 10*3/uL (ref 0.0–0.5)
HCT: 37.1 % — ABNORMAL LOW (ref 38.4–49.9)
HEMOGLOBIN: 12 g/dL — AB (ref 13.0–17.1)
LYMPH#: 0.9 10*3/uL (ref 0.9–3.3)
LYMPH%: 18.5 % (ref 14.0–49.0)
MCH: 34.4 pg — AB (ref 27.2–33.4)
MCHC: 32.3 g/dL (ref 32.0–36.0)
MCV: 106.3 fL — AB (ref 79.3–98.0)
MONO#: 0.5 10*3/uL (ref 0.1–0.9)
MONO%: 10.4 % (ref 0.0–14.0)
NEUT#: 3.3 10*3/uL (ref 1.5–6.5)
NEUT%: 66.7 % (ref 39.0–75.0)
Platelets: 176 10*3/uL (ref 140–400)
RBC: 3.49 10*6/uL — ABNORMAL LOW (ref 4.20–5.82)
RDW: 13.4 % (ref 11.0–14.6)
WBC: 5 10*3/uL (ref 4.0–10.3)

## 2014-03-04 LAB — COMPREHENSIVE METABOLIC PANEL (CC13)
ALK PHOS: 106 U/L (ref 40–150)
ALT: 19 U/L (ref 0–55)
AST: 19 U/L (ref 5–34)
Albumin: 3.2 g/dL — ABNORMAL LOW (ref 3.5–5.0)
Anion Gap: 10 mEq/L (ref 3–11)
BUN: 15.9 mg/dL (ref 7.0–26.0)
CALCIUM: 9.3 mg/dL (ref 8.4–10.4)
CHLORIDE: 105 meq/L (ref 98–109)
CO2: 26 mEq/L (ref 22–29)
CREATININE: 0.8 mg/dL (ref 0.7–1.3)
EGFR: 87 mL/min/{1.73_m2} — ABNORMAL LOW (ref >90)
Glucose: 116 mg/dl (ref 70–140)
Potassium: 4 mEq/L (ref 3.5–5.1)
Sodium: 140 mEq/L (ref 136–145)
Total Bilirubin: 0.28 mg/dL (ref 0.20–1.20)
Total Protein: 6.5 g/dL (ref 6.4–8.3)

## 2014-03-04 LAB — TSH CHCC: TSH: 2.095 m[IU]/L (ref 0.320–4.118)

## 2014-03-04 MED ORDER — SODIUM CHLORIDE 0.9 % IV SOLN: 3.0000 mg/kg | Freq: Once | INTRAVENOUS | Status: DC

## 2014-03-04 MED ORDER — SODIUM CHLORIDE 0.9 % IJ SOLN
10.0000 mL | INTRAMUSCULAR | Status: DC | PRN
  Administered 2014-03-04: 10 mL
  Filled 2014-03-04: qty 10

## 2014-03-04 MED ORDER — SODIUM CHLORIDE 0.9 % IV SOLN
Freq: Once | INTRAVENOUS | Status: AC
  Administered 2014-03-04: 13:00:00 via INTRAVENOUS

## 2014-03-04 MED ORDER — SODIUM CHLORIDE 0.9 % IJ SOLN
10.0000 mL | INTRAMUSCULAR | Status: DC | PRN
  Administered 2014-03-04: 10 mL via INTRAVENOUS
  Filled 2014-03-04: qty 10

## 2014-03-04 MED ORDER — OXYCODONE-ACETAMINOPHEN 5-325 MG PO TABS: 1.0000 | ORAL_TABLET | Freq: Four times a day (QID) | ORAL | Status: DC | PRN

## 2014-03-04 MED ORDER — HEPARIN SOD (PORK) LOCK FLUSH 100 UNIT/ML IV SOLN
500.0000 [IU] | Freq: Once | INTRAVENOUS | Status: AC | PRN
  Administered 2014-03-04: 500 [IU]
  Filled 2014-03-04: qty 5

## 2014-03-04 MED ORDER — SODIUM CHLORIDE 0.9 % IV SOLN
2.9000 mg/kg | Freq: Once | INTRAVENOUS | Status: AC
  Administered 2014-03-04: 240 mg via INTRAVENOUS
  Filled 2014-03-04: qty 4

## 2014-03-04 NOTE — Patient Instructions (Signed)
Your restaging CT scan showed improvement in your disease Follow up in 2 weeks, prior to your next scheduled cycle of immunotherapy

## 2014-03-04 NOTE — Patient Instructions (Signed)

## 2014-03-04 NOTE — Progress Notes (Addendum)
[place]  [phone] [phone]  SHARED VISIT PROGRESS NOTE  [name], MD 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   Primary site: Lung (Right)   Staging method: AJCC 7th Edition   Clinical: Stage IIIA (T3, N2, M0) signed by Curt Bears, MD on 05/09/2013  2:56 PM   Summary: Stage IIIA (T3, N2, M0)  PRIOR THERAPY:  1) Concurrent chemoradiation with weekly carboplatin for an AUC of 2 and paclitaxel 45 mg per meter squared. Status post 5 cycles. 2) Bronchoscopy, mediastinoscopy, right posterior lateral thoracotomy with right upper lobectomy, lymph node dissection,  and posterior approach to en bloc chest wall resection, ribs 2, 3, and 4, with positive bronchial resection margin. 3) palliative radiotherapy to the best of resection margin under the care of Dr. Tammi Klippel completed on 10/14/2013. 4) systemic chemotherapy with carboplatin for an AUC of 5 given on day 1 and Abraxane 100 mg/m given days 1, 8 and 15 every 3 weeks status post 3 cycles discontinued 12/31/2013 secondary to disease progression  CURRENT THERAPY: Immunotherapy with Nivolumab 3 mg/kg given every 2 weeks.  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    INTERVAL HISTORY: GEORDIE NOONEY 73 y.o. male returns for followup visit. Overall is tolerating his treatment with immunotherapy with  Nivolumab without significant difficulty. He denies any change in his baseline shortness of breath, no diarrhea or skin rash. He continues to have some residual numbness/tingling in his toes as well as his right hand from previous chemotherapy. This is stable.  He has a cough which is been long-standing primarily dry although occasionally productive of clear secretions. This is well managed with Delsym cough syrup during the day and Hycodan cough syrup at night. He continues to complain of pain in both hips since the start of treatment. His been taking Percocet to 3 times a day with reasonable relief. He requests a refill for his Percocet. He voiced no other specific complaints today.  He has no hemoptysis. He denied having any significant weight loss or night sweats. He has no nausea or vomiting. He recently had a restaging CT scan of his chest, abdomen and pelvis with contrast and presents to discuss the results.    MEDICAL HISTORY: Past Medical History  Diagnosis Date  . Hypercholesteremia 05/14/2011  . BPH (benign prostatic hyperplasia) 05/14/2011    takes Uroxatral daily  . GERD (gastroesophageal reflux disease)   . Shortness of breath     weather related  . Pneumonia 1965  . Headache(784.0)     occasionally  . Joint pain   . Maintenance chemotherapy   . Urinary frequency   . History of shingles   .  Anemia   . Acquired thrombocytopenia   . 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 Cancer     squamous cell carcinoma right superior sulcus  . Lung cancer     right lung    ALLERGIES:  has No Known Allergies.  MEDICATIONS:  Current Outpatient Prescriptions  Medication Sig Dispense Refill  . alfuzosin (UROXATRAL) 10 MG 24 hr tablet  Take 10 mg by mouth daily with breakfast.    . cycloSPORINE (RESTASIS) 0.05 % ophthalmic emulsion 1 drop 2 (two) times daily.    Marland Kitchen docusate sodium (COLACE) 100 MG capsule Take 100 mg by mouth daily.    . feeding supplement, RESOURCE BREEZE, (RESOURCE BREEZE) LIQD Take 1 Container by mouth daily at 3 pm.  0  . HYDROcodone-homatropine (HYCODAN) 5-1.5 MG/5ML syrup Take 5 mLs by mouth every 6 (six) hours as needed for cough. 240 mL 0  . Multiple Vitamin (MULITIVITAMIN WITH MINERALS) TABS Take 1 tablet by mouth daily.    Marland Kitchen oxyCODONE-acetaminophen (PERCOCET/ROXICET) 5-325 MG per tablet Take 1 tablet by mouth every 6 (six) hours as needed for severe pain. 90 tablet 0  . polyethylene glycol (MIRALAX / GLYCOLAX) packet Take 17 g by mouth daily.    . prochlorperazine (COMPAZINE) 5 MG tablet Take 5 mg by mouth every 6 (six) hours as needed for nausea or vomiting.     No current facility-administered medications for this visit.   Facility-Administered Medications Ordered in Other Visits  Medication Dose Route Frequency Provider Last Rate Last Dose  . sodium chloride 0.9 % injection 10 mL  10 mL Intracatheter PRN Curt Bears, MD   10 mL at 03/04/14 1455    SURGICAL HISTORY:  Past Surgical History  Procedure Laterality Date  . Other surgical history      benign tumor removed from left leg several years ago  . Other surgical history      had melanoma/squamous cell removed previously per patient  . Tumor excision      Left knee  . Colonoscopy w/ biopsies and polypectomy    . Video bronchoscopy N/A 07/30/2013    Procedure: VIDEO BRONCHOSCOPY;  Surgeon: Grace Isaac, MD;  Location: [facility];  Service: Thoracic;  Laterality: N/A;  . Mediastinoscopy N/A 07/30/2013    Procedure: MEDIASTINOSCOPY;  Surgeon: Grace Isaac, MD;  Location: [facility];  Service: Thoracic;  Laterality: N/A;  . Video assisted thoracoscopy (vats)/wedge resection Right 07/30/2013    Procedure: VIDEO ASSISTED THORACOSCOPY;  Surgeon:  Grace Isaac, MD;  Location: [facility];  Service: Thoracic;  Laterality: Right;  . Thoracotomy  07/30/2013    Procedure: THORACOTOMY FOR CHEST WALL RESECTION, RIGHT UPPER LOBECTOMY;  Surgeon: Grace Isaac, MD;  Location: [facility];  Service: Thoracic;;  . Adenoidectomy  as a child  . Tonsillectomy    . Portacath placement Left 10/30/2013    Procedure: INSERTION PORT-A-CATH WITH ULTRASOUND GUIDANCE AND FLUORO;  Surgeon: Grace Isaac, MD;  Location: [facility];  Service: Thoracic;  Laterality: Left;    REVIEW OF SYSTEMS:  Constitutional: positive for fatigue Eyes: negative Ears, nose, mouth, throat, and face: negative Respiratory: positive for cough and dyspnea on exertion Cardiovascular: negative Gastrointestinal: negative Genitourinary:negative Integument/breast: negative Hematologic/lymphatic: negative Musculoskeletal:positive for arthralgias Neurological: positive for paresthesia Behavioral/Psych: negative Endocrine: negative Allergic/Immunologic: negative   PHYSICAL EXAMINATION: General appearance: alert, cooperative, appears stated age and no distress Head: Normocephalic, without obvious abnormality, atraumatic Neck: no adenopathy, no carotid bruit, no JVD,  supple, symmetrical, trachea midline and thyroid not enlarged, symmetric, no tenderness/mass/nodules Lymph nodes: Cervical, supraclavicular, and axillary nodes normal. Resp: clear to auscultation bilaterally Back: symmetric, no curvature. ROM normal. No CVA tenderness. Cardio: regular rate and rhythm, S1, S2 normal, no murmur, click, rub or gallop GI: soft, non-tender; bowel sounds normal; no masses,  no organomegaly Extremities: extremities normal, atraumatic, no cyanosis or edema and The left forearm and hand are cooler to touch than the right Neurologic: Alert and oriented X 3, normal strength and tone. Normal symmetric reflexes. Normal coordination and gait Skin: Left anterior chest Port-A-Cath incisions are well healed, no  evidence of infection  ECOG PERFORMANCE STATUS: 1 - Symptomatic but completely ambulatory  Blood pressure 102/55, pulse 86, temperature 98.1 F (36.7 C), temperature source Oral, resp. rate 18, height 6\' 2"  (1.88 m), weight 191 lb 14.4 oz (87.045 kg).  LABORATORY DATA: Lab Results  Component Value Date   WBC 5.0 03/04/2014   HGB 12.0* 03/04/2014   HCT 37.1* 03/04/2014   MCV 106.3* 03/04/2014   PLT 176 03/04/2014      Chemistry      Component Value Date/Time   NA 140 03/04/2014 1133   NA 140 12/19/2013 0405   K 4.0 03/04/2014 1133   K 4.2 12/19/2013 0405   CL 104 12/19/2013 0405   CO2 26 03/04/2014 1133   CO2 26 12/19/2013 0405   BUN 15.9 03/04/2014 1133   BUN 14 12/19/2013 0405   CREATININE 0.8 03/04/2014 1133   CREATININE 0.87 12/19/2013 0405      Component Value Date/Time   CALCIUM 9.3 03/04/2014 1133   CALCIUM 9.0 12/19/2013 0405   ALKPHOS 106 03/04/2014 1133   ALKPHOS 126* 12/18/2013 1610   AST 19 03/04/2014 1133   AST 17 12/18/2013 1610   ALT 19 03/04/2014 1133   ALT 16 12/18/2013 1610   BILITOT 0.28 03/04/2014 1133   BILITOT 0.4 12/18/2013 1610       RADIOGRAPHIC STUDIES: Dg Chest 2 View  02/13/2014   CLINICAL DATA:  History of right apical squamous cell carcinoma with resection,, interval development of lytic lesion in the left chest wall with chemotherapy, followup  EXAM: CHEST  2 VIEW  COMPARISON:  Chest x-ray of 01/03/2014  FINDINGS: Volume loss in the right hemi thorax is unchanged with apical and basilar pleural thickening. The lytic lesion involving the left posterior seventh rib has diminished in size after interval chemotherapy. No new abnormality is seen. No effusion is noted. The left-sided Port-A-Cath is unchanged in position with the tip near the expected SVC -RA junction. There are diffuse degenerative changes throughout the thoracic spine.  IMPRESSION: 1. Decrease in size of soft tissue mass associated with the lytic lesion of the left  posterolateral seventh rib. 2. No change in volume loss on the right secondary to prior surgery for resection of right apical squamous cell carcinoma.   Electronically Signed   By: Ivar Drape M.D.   On: 02/13/2014 13:07   Ct Chest W Contrast  02/28/2014   CLINICAL DATA:  Subsequent encounter for non-small-cell lung cancer.  EXAM: CT CHEST, ABDOMEN, AND PELVIS WITH CONTRAST  TECHNIQUE: Multidetector CT imaging of the chest, abdomen and pelvis was performed following the standard protocol during bolus administration of intravenous contrast.  CONTRAST:  121mL OMNIPAQUE IOHEXOL 300 MG/ML  SOLN  COMPARISON:  12/27/2013.  FINDINGS: CT CHEST FINDINGS  Soft tissue / Mediastinum: Left-sided Port-A-Cath tip is positioned at the junction of the SVC and RA. There  is no axillary lymphadenopathy. The mediastinal lymph nodes seen on the previous study have decreased in size in the interval. 10 mm short axis prevascular lymph node on the previous study is now 4 mm. The 10 mm precarinal lymph nodes seen previously is now 6 mm in short axis. The 19 mm short axis subcarinal lymph node has decreased in size to 12 mm short axis. Heart size is within normal limits. Coronary artery calcification is noted. No pericardial effusion.  Lungs / Pleura: Postsurgical changes again seen in the right hemi thorax. Central presumed post radiation changes are stable. The small to moderate right pleural effusion has progressed in the interval. Interval slight progression of parenchymal scarring seen towards the right lung base in attention to this region on followup imaging is recommended.  Bones: Posterior left chest wall lesion has decreased in size in the interval, measuring 1.4 x 4.0 cm today compared to 3.2 x 5.8 cm previously.  CT ABDOMEN AND PELVIS FINDINGS  Hepatobiliary: No focal abnormality is seen in the liver. Mineralized stones are visible within the lumen of the gallbladder. No intrahepatic or extrahepatic biliary dilation.  Pancreas:  No focal mass lesion. No dilatation of the main duct. No intraparenchymal cyst. No peripancreatic edema.  Spleen: No splenomegaly. No focal mass lesion.  Adrenals/Urinary Tract: No adrenal nodule or mass. Kidneys are unremarkable.  Stomach/Bowel: Stomach is nondistended. No gastric wall thickening. No evidence of outlet obstruction. Duodenum is normally positioned as is the ligament of Treitz. No small bowel wall thickening. No small bowel dilatation. Terminal ileum normal. Appendix remains mildly distended but shows no periappendiceal inflammation or edema. No gross colonic mass. No colonic wall thickening. No substantial diverticular change.  Vascular/Lymphatic: Atherosclerotic calcification is noted in the wall of the abdominal aorta without aneurysm. No lymphadenopathy in the abdomen or pelvis.  Reproductive: Prostate gland is enlarged.  Other: No intraperitoneal free fluid.  Musculoskeletal: No worrisome lytic or sclerotic bony abnormality in the abdomen or pelvis.  IMPRESSION: Interval decrease in size of the suspicious mediastinal lymph nodes seen on the previous study.  Slight increase in right pleural effusion.  No evidence for metastatic disease in the abdomen or pelvis.   Electronically Signed   By: Misty Stanley M.D.   On: 02/28/2014 16:37   Ct Abdomen Pelvis W Contrast  02/28/2014   CLINICAL DATA:  Subsequent encounter for non-small-cell lung cancer.  EXAM: CT CHEST, ABDOMEN, AND PELVIS WITH CONTRAST  TECHNIQUE: Multidetector CT imaging of the chest, abdomen and pelvis was performed following the standard protocol during bolus administration of intravenous contrast.  CONTRAST:  169mL OMNIPAQUE IOHEXOL 300 MG/ML  SOLN  COMPARISON:  12/27/2013.  FINDINGS: CT CHEST FINDINGS  Soft tissue / Mediastinum: Left-sided Port-A-Cath tip is positioned at the junction of the SVC and RA. There is no axillary lymphadenopathy. The mediastinal lymph nodes seen on the previous study have decreased in size in the  interval. 10 mm short axis prevascular lymph node on the previous study is now 4 mm. The 10 mm precarinal lymph nodes seen previously is now 6 mm in short axis. The 19 mm short axis subcarinal lymph node has decreased in size to 12 mm short axis. Heart size is within normal limits. Coronary artery calcification is noted. No pericardial effusion.  Lungs / Pleura: Postsurgical changes again seen in the right hemi thorax. Central presumed post radiation changes are stable. The small to moderate right pleural effusion has progressed in the interval. Interval slight progression of parenchymal scarring  seen towards the right lung base in attention to this region on followup imaging is recommended.  Bones: Posterior left chest wall lesion has decreased in size in the interval, measuring 1.4 x 4.0 cm today compared to 3.2 x 5.8 cm previously.  CT ABDOMEN AND PELVIS FINDINGS  Hepatobiliary: No focal abnormality is seen in the liver. Mineralized stones are visible within the lumen of the gallbladder. No intrahepatic or extrahepatic biliary dilation.  Pancreas: No focal mass lesion. No dilatation of the main duct. No intraparenchymal cyst. No peripancreatic edema.  Spleen: No splenomegaly. No focal mass lesion.  Adrenals/Urinary Tract: No adrenal nodule or mass. Kidneys are unremarkable.  Stomach/Bowel: Stomach is nondistended. No gastric wall thickening. No evidence of outlet obstruction. Duodenum is normally positioned as is the ligament of Treitz. No small bowel wall thickening. No small bowel dilatation. Terminal ileum normal. Appendix remains mildly distended but shows no periappendiceal inflammation or edema. No gross colonic mass. No colonic wall thickening. No substantial diverticular change.  Vascular/Lymphatic: Atherosclerotic calcification is noted in the wall of the abdominal aorta without aneurysm. No lymphadenopathy in the abdomen or pelvis.  Reproductive: Prostate gland is enlarged.  Other: No intraperitoneal  free fluid.  Musculoskeletal: No worrisome lytic or sclerotic bony abnormality in the abdomen or pelvis.  IMPRESSION: Interval decrease in size of the suspicious mediastinal lymph nodes seen on the previous study.  Slight increase in right pleural effusion.  No evidence for metastatic disease in the abdomen or pelvis.   Electronically Signed   By: Misty Stanley M.D.   On: 02/28/2014 16:37    ASSESSMENT/PLAN:  This is a very pleasant 73 years old white male recently diagnosed with a stage IIIA non-small cell lung cancer. He completed a course of concurrent chemoradiation with weekly carboplatin and paclitaxel status post 5 cycle, this was followed by right upper lobectomy with lymph node dissection under the care of Dr. Servando Snare. The final pathology showed positive bronchial resection margin with a pathologic stage ypT3, ypN0. The patient underwent a course of radiotherapy to the positive resection margin but unfortunately the recent CT scan of the chest showed evidence for disease progression with new lytic rib lesion as well as enlarged mediastinal lymphadenopathy. The MRI of the brain was negative for brain metastasis. He is status post 3 cycles of systemic chemotherapy with carboplatin for AUC of 5 on day 1 and Abraxane 100 mg/M2 on days 1, 8 and 15 every 3 weeks. However his restaging CT scan showed evidence for disease progression. He is now being treated with immunotherapy with Nivolumab. He is status post 4 cycles. Patient was discussed with and also seen by Dr. Julien Nordmann. The restaging CT scan revealed interval decrease in the side of the suspicious mediastinal lymph nodes that was seen on the previous study. There was slight increase in the right pleural effusion. There is no evidence for metastatic disease in the abdomen or the pelvis. These results were reviewed with the patient. He will proceed with cycle #5 today as scheduled. He was given a refill prescription for his Percocet tablets 5/325 mg a  total of 90 tablets with no refill. He will follow-up in 2 weeks prior to the start of cycle #6. He was advised to call immediately if he has any concerning symptoms in the interval.  Disclaimer: This note was dictated with voice recognition software. Similar sounding words can inadvertently be transcribed and may not be corrected upon review. Terry Adam, PA-C 03/04/2014  ADDENDUM: Hematology/Oncology Attending: I  had a face to face encounter with the patient today. I recommended his care plan. This is a very pleasant 73 years old white male with metastatic non-small cell lung cancer, squamous cell carcinoma currently undergoing immunotherapy with Nivolumab status post 4 cycles. The patient tolerated the treatment fairly well with no significant adverse effects. The pain on the left side of the chest is currently well-controlled with the current pain medication. He has repeat CT scan of the chest, abdomen and pelvis performed recently that showed improvement in his disease with decrease in the size of the suspicious mediastinal lymph nodes and no evidence of metastatic disease in the abdomen or pelvis. I discussed the scan results with the patient today. I recommended for him to continue his current treatment with Nivolumab. He will start cycle #5 today. The patient would come back for follow-up visit in 2 weeks for reevaluation and management of any adverse effect of his treatment. He was given a refill of his pain medication. He was advised to call immediately if he has any concerning symptoms in the interval.  Disclaimer: This note was dictated with voice recognition software. Similar sounding words can inadvertently be transcribed and may be missed upon review. [name]., MD 03/04/2014

## 2014-03-04 NOTE — Telephone Encounter (Signed)
Pt confirmed labs/ov per 12/08 POF, gave pt AVS.... KJ

## 2014-03-04 NOTE — Patient Instructions (Signed)
[facility] Discharge Instructions for Patients Receiving Chemotherapy  Today you received the following chemotherapy agents: nivolumab   To help prevent nausea and vomiting after your treatment, we encourage you to take your nausea medication.  Take it as often as prescribed.     If you develop nausea and vomiting that is not controlled by your nausea medication, call the clinic. If it is after clinic hours your family physician or the after hours number for the clinic or go to the Emergency Department.   BELOW ARE SYMPTOMS THAT SHOULD BE REPORTED IMMEDIATELY:  *FEVER GREATER THAN 100.5 F  *CHILLS WITH OR WITHOUT FEVER  NAUSEA AND VOMITING THAT IS NOT CONTROLLED WITH YOUR NAUSEA MEDICATION  *UNUSUAL SHORTNESS OF BREATH  *UNUSUAL BRUISING OR BLEEDING  TENDERNESS IN MOUTH AND THROAT WITH OR WITHOUT PRESENCE OF ULCERS  *URINARY PROBLEMS  *BOWEL PROBLEMS  UNUSUAL RASH Items with * indicate a potential emergency and should be followed up as soon as possible.  Feel free to call the clinic you have any questions or concerns. The clinic phone number is [phone].   I have been informed and understand all the instructions given to me. I know to contact the clinic, my physician, or go to the Emergency Department if any problems should occur. I do not have any questions at this time, but understand that I may call the clinic during office hours   should I have any questions or need assistance in obtaining follow up care.    __________________________________________  _____________  __________ Signature of Patient or Authorized Representative            Date                   Time    __________________________________________ Nurse's Signature

## 2014-03-18 ENCOUNTER — Ambulatory Visit (HOSPITAL_BASED_OUTPATIENT_CLINIC_OR_DEPARTMENT_OTHER): Payer: Medicare Other | Admitting: Lab

## 2014-03-18 ENCOUNTER — Other Ambulatory Visit: Payer: Medicare Other

## 2014-03-18 ENCOUNTER — Ambulatory Visit (HOSPITAL_BASED_OUTPATIENT_CLINIC_OR_DEPARTMENT_OTHER): Payer: Medicare Other | Admitting: Oncology

## 2014-03-18 ENCOUNTER — Encounter: Payer: Self-pay | Admitting: Oncology

## 2014-03-18 ENCOUNTER — Ambulatory Visit (HOSPITAL_BASED_OUTPATIENT_CLINIC_OR_DEPARTMENT_OTHER): Payer: Medicare Other

## 2014-03-18 ENCOUNTER — Ambulatory Visit: Payer: Medicare Other

## 2014-03-18 VITALS — BP 112/58 | HR 80 | Temp 98.1°F | Resp 18 | Ht 74.0 in | Wt 194.7 lb

## 2014-03-18 DIAGNOSIS — C3411 Malignant neoplasm of upper lobe, right bronchus or lung: Secondary | ICD-10-CM

## 2014-03-18 DIAGNOSIS — Z5112 Encounter for antineoplastic immunotherapy: Secondary | ICD-10-CM

## 2014-03-18 DIAGNOSIS — Z95828 Presence of other vascular implants and grafts: Secondary | ICD-10-CM

## 2014-03-18 DIAGNOSIS — C3492 Malignant neoplasm of unspecified part of left bronchus or lung: Secondary | ICD-10-CM

## 2014-03-18 LAB — COMPREHENSIVE METABOLIC PANEL (CC13)
ALK PHOS: 111 U/L (ref 40–150)
ALT: 38 U/L (ref 0–55)
AST: 30 U/L (ref 5–34)
Albumin: 3.3 g/dL — ABNORMAL LOW (ref 3.5–5.0)
Anion Gap: 8 mEq/L (ref 3–11)
BUN: 14 mg/dL (ref 7.0–26.0)
CALCIUM: 9.4 mg/dL (ref 8.4–10.4)
CO2: 28 meq/L (ref 22–29)
Chloride: 105 mEq/L (ref 98–109)
Creatinine: 0.8 mg/dL (ref 0.7–1.3)
EGFR: 88 mL/min/{1.73_m2} — ABNORMAL LOW (ref >90)
Glucose: 103 mg/dl (ref 70–140)
Potassium: 4.1 mEq/L (ref 3.5–5.1)
SODIUM: 140 meq/L (ref 136–145)
Total Bilirubin: 0.29 mg/dL (ref 0.20–1.20)
Total Protein: 6.5 g/dL (ref 6.4–8.3)

## 2014-03-18 LAB — CBC WITH DIFFERENTIAL/PLATELET
BASO%: 0.7 % (ref 0.0–2.0)
Basophils Absolute: 0 10*3/uL (ref 0.0–0.1)
EOS%: 2.4 % (ref 0.0–7.0)
Eosinophils Absolute: 0.1 10*3/uL (ref 0.0–0.5)
HCT: 36.8 % — ABNORMAL LOW (ref 38.4–49.9)
HGB: 11.9 g/dL — ABNORMAL LOW (ref 13.0–17.1)
LYMPH#: 0.8 10*3/uL — AB (ref 0.9–3.3)
LYMPH%: 17.8 % (ref 14.0–49.0)
MCH: 33.9 pg — ABNORMAL HIGH (ref 27.2–33.4)
MCHC: 32.5 g/dL (ref 32.0–36.0)
MCV: 104.5 fL — ABNORMAL HIGH (ref 79.3–98.0)
MONO#: 0.5 10*3/uL (ref 0.1–0.9)
MONO%: 11.1 % (ref 0.0–14.0)
NEUT%: 68 % (ref 39.0–75.0)
NEUTROS ABS: 3 10*3/uL (ref 1.5–6.5)
Platelets: 195 10*3/uL (ref 140–400)
RBC: 3.52 10*6/uL — ABNORMAL LOW (ref 4.20–5.82)
RDW: 13.5 % (ref 11.0–14.6)
WBC: 4.4 10*3/uL (ref 4.0–10.3)

## 2014-03-18 MED ORDER — SODIUM CHLORIDE 0.9 % IJ SOLN
10.0000 mL | INTRAMUSCULAR | Status: DC | PRN
  Administered 2014-03-18: 10 mL
  Filled 2014-03-18: qty 10

## 2014-03-18 MED ORDER — SODIUM CHLORIDE 0.9 % IV SOLN
240.0000 mg | Freq: Once | INTRAVENOUS | Status: AC
  Administered 2014-03-18: 240 mg via INTRAVENOUS
  Filled 2014-03-18: qty 24

## 2014-03-18 MED ORDER — SODIUM CHLORIDE 0.9 % IJ SOLN
10.0000 mL | INTRAMUSCULAR | Status: DC | PRN
  Administered 2014-03-18: 10 mL via INTRAVENOUS
  Filled 2014-03-18: qty 10

## 2014-03-18 MED ORDER — SODIUM CHLORIDE 0.9 % IV SOLN
Freq: Once | INTRAVENOUS | Status: AC
  Administered 2014-03-18: 12:00:00 via INTRAVENOUS

## 2014-03-18 MED ORDER — HEPARIN SOD (PORK) LOCK FLUSH 100 UNIT/ML IV SOLN
500.0000 [IU] | Freq: Once | INTRAVENOUS | Status: AC | PRN
  Administered 2014-03-18: 500 [IU]
  Filled 2014-03-18: qty 5

## 2014-03-18 NOTE — Patient Instructions (Signed)
[facility] Discharge Instructions for Patients Receiving Chemotherapy  Today you received the following chemotherapy agents: Nivolumab  To help prevent nausea and vomiting after your treatment, we encourage you to take your nausea medication as prescribed by your physician.   If you develop nausea and vomiting that is not controlled by your nausea medication, call the clinic.   BELOW ARE SYMPTOMS THAT SHOULD BE REPORTED IMMEDIATELY:  *FEVER GREATER THAN 100.5 F  *CHILLS WITH OR WITHOUT FEVER  NAUSEA AND VOMITING THAT IS NOT CONTROLLED WITH YOUR NAUSEA MEDICATION  *UNUSUAL SHORTNESS OF BREATH  *UNUSUAL BRUISING OR BLEEDING  TENDERNESS IN MOUTH AND THROAT WITH OR WITHOUT PRESENCE OF ULCERS  *URINARY PROBLEMS  *BOWEL PROBLEMS  UNUSUAL RASH Items with * indicate a potential emergency and should be followed up as soon as possible.  Feel free to call the clinic you have any questions or concerns. The clinic phone number is [phone].

## 2014-03-18 NOTE — Progress Notes (Signed)
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   Primary site: Lung (Right)   Staging method: AJCC 7th Edition   Clinical: Stage IIIA (T3, N2, M0) signed by Curt Bears, MD on 05/09/2013  2:56 PM   Summary: Stage IIIA (T3, N2, M0)  PRIOR THERAPY:  1) Concurrent chemoradiation with weekly carboplatin for an AUC of 2 and paclitaxel 45 mg per meter squared. Status post 5 cycles. 2) Bronchoscopy, mediastinoscopy, right posterior lateral thoracotomy with right upper lobectomy, lymph node dissection,  and posterior approach to en bloc chest wall resection, ribs 2, 3, and 4, with positive bronchial resection margin. 3) palliative radiotherapy to the best of resection margin under the care of Dr. Tammi Klippel completed on 10/14/2013. 4) systemic chemotherapy with carboplatin for an AUC of 5 given on day 1 and Abraxane 100 mg/m given days 1, 8 and 15 every 3 weeks status post 3 cycles discontinued 12/31/2013 secondary to disease progression  CURRENT THERAPY: Immunotherapy with Nivolumab 3 mg/kg given every 2 weeks.  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    INTERVAL HISTORY: Terry Robles 73 y.o. male returns for followup visit. Overall is tolerating his treatment with immunotherapy with  Nivolumab without significant difficulty. He denies any change in his baseline shortness of breath, no diarrhea or skin rash. He continues to have some residual numbness/tingling in his toes as well as his right hand from previous chemotherapy. This is stable.  He has a cough which is been long-standing primarily dry although occasionally productive of clear secretions. This is well managed with Delsym cough syrup during the day and Hycodan cough syrup at night. He continues to complain of pain in both hips and his right shoulder since the start of treatment. His been taking Percocet to 3 times a day with reasonable relief. He voiced no other specific complaints today.  He has no hemoptysis. He denied having any significant weight loss or night sweats. He has no nausea or vomiting.     MEDICAL HISTORY: Past Medical History  Diagnosis Date  . Hypercholesteremia 05/14/2011  . BPH (benign prostatic hyperplasia) 05/14/2011    takes Uroxatral daily  . GERD (gastroesophageal reflux disease)   . Shortness of breath     weather related  . Pneumonia 1965  . Headache(784.0)     occasionally  . Joint pain   . Maintenance chemotherapy   . Urinary frequency   . History of shingles   . Anemia   . Acquired thrombocytopenia   . 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  Cancer     squamous cell carcinoma right superior sulcus  . Lung cancer     right lung    ALLERGIES:  has No Known Allergies.  MEDICATIONS:  Current Outpatient Prescriptions  Medication Sig Dispense Refill  . alfuzosin (UROXATRAL) 10 MG 24 hr tablet Take 10 mg by mouth daily with breakfast.    . cycloSPORINE (RESTASIS) 0.05 % ophthalmic emulsion 1 drop 2 (two) times daily.    Marland Kitchen  docusate sodium (COLACE) 100 MG capsule Take 100 mg by mouth daily.    . feeding supplement, RESOURCE BREEZE, (RESOURCE BREEZE) LIQD Take 1 Container by mouth daily at 3 pm.  0  . HYDROcodone-homatropine (HYCODAN) 5-1.5 MG/5ML syrup Take 5 mLs by mouth every 6 (six) hours as needed for cough. 240 mL 0  . Multiple Vitamin (MULITIVITAMIN WITH MINERALS) TABS Take 1 tablet by mouth daily.    Marland Kitchen oxyCODONE-acetaminophen (PERCOCET/ROXICET) 5-325 MG per tablet Take 1 tablet by mouth every 6 (six) hours as needed for severe pain. 90 tablet 0  . polyethylene glycol (MIRALAX / GLYCOLAX) packet Take 17 g by mouth daily.    . prochlorperazine (COMPAZINE) 5 MG tablet Take 5 mg by mouth every 6 (six) hours as needed for nausea or vomiting.     No current facility-administered medications for this visit.   Facility-Administered Medications Ordered in Other Visits  Medication Dose Route Frequency Provider Last Rate Last Dose  . 0.9 %  sodium chloride infusion   Intravenous Once Curt Bears, MD      . heparin lock flush 100 unit/mL  500 Units Intracatheter Once PRN Curt Bears, MD      . nivolumab (OPDIVO) 240 mg in sodium chloride 0.9 % 100 mL chemo infusion  240 mg Intravenous Once Curt Bears, MD      . sodium chloride 0.9 % injection 10 mL  10 mL Intracatheter PRN Curt Bears, MD        SURGICAL HISTORY:  Past Surgical History  Procedure Laterality Date  . Other surgical history      benign tumor removed from left leg several years ago  . Other surgical history      had melanoma/squamous cell removed previously per patient  . Tumor excision      Left knee  . Colonoscopy w/ biopsies and polypectomy    . Video bronchoscopy N/A 07/30/2013    Procedure: VIDEO BRONCHOSCOPY;  Surgeon: Grace Isaac, MD;  Location: [facility];  Service: Thoracic;  Laterality: N/A;  . Mediastinoscopy N/A 07/30/2013    Procedure: MEDIASTINOSCOPY;  Surgeon: Grace Isaac, MD;  Location: [facility];  Service:  Thoracic;  Laterality: N/A;  . Video assisted thoracoscopy (vats)/wedge resection Right 07/30/2013    Procedure: VIDEO ASSISTED THORACOSCOPY;  Surgeon: Grace Isaac, MD;  Location: [facility];  Service: Thoracic;  Laterality: Right;  . Thoracotomy  07/30/2013    Procedure: THORACOTOMY FOR CHEST WALL RESECTION, RIGHT UPPER LOBECTOMY;  Surgeon: Grace Isaac, MD;  Location: [facility];  Service: Thoracic;;  . Adenoidectomy  as a child  . Tonsillectomy    . Portacath placement Left 10/30/2013    Procedure: INSERTION PORT-A-CATH WITH ULTRASOUND GUIDANCE AND FLUORO;  Surgeon: Grace Isaac, MD;  Location: [facility];  Service: Thoracic;  Laterality: Left;    REVIEW OF SYSTEMS:  Constitutional: positive for fatigue Eyes: negative Ears, nose, mouth, throat, and face: negative Respiratory: positive for cough and dyspnea on exertion Cardiovascular: negative Gastrointestinal: negative Genitourinary:negative Integument/breast: negative Hematologic/lymphatic: negative Musculoskeletal:positive  for arthralgias Neurological: positive for paresthesia Behavioral/Psych: negative Endocrine: negative Allergic/Immunologic: negative   PHYSICAL EXAMINATION: General appearance: alert, cooperative, appears stated age and no distress Head: Normocephalic, without obvious abnormality, atraumatic Neck: no adenopathy, no carotid bruit, no JVD, supple, symmetrical, trachea midline and thyroid not enlarged, symmetric, no tenderness/mass/nodules Lymph nodes: Cervical, supraclavicular, and axillary nodes normal. Resp: clear to auscultation bilaterally Back: symmetric, no curvature. ROM normal. No CVA tenderness. Cardio: regular rate and rhythm, S1, S2 normal, no murmur, click, rub or gallop GI: soft, non-tender; bowel sounds normal; no masses,  no organomegaly Extremities: extremities normal, atraumatic, no cyanosis or edema and The left forearm and hand are cooler to touch than the right Neurologic: Alert and oriented X  3, normal strength and tone. Normal symmetric reflexes. Normal coordination and gait Skin: Left anterior chest Port-A-Cath incisions are well healed, no evidence of infection  ECOG PERFORMANCE STATUS: 1 - Symptomatic but completely ambulatory  Blood pressure 112/58, pulse 80, temperature 98.1 F (36.7 C), temperature source Oral, resp. rate 18, height 6\' 2"  (1.88 m), weight 194 lb 11.2 oz (88.315 kg), SpO2 99 %.  LABORATORY DATA: Lab Results  Component Value Date   WBC 4.4 03/18/2014   HGB 11.9* 03/18/2014   HCT 36.8* 03/18/2014   MCV 104.5* 03/18/2014   PLT 195 03/18/2014      Chemistry      Component Value Date/Time   NA 140 03/18/2014 1014   NA 140 12/19/2013 0405   K 4.1 03/18/2014 1014   K 4.2 12/19/2013 0405   CL 104 12/19/2013 0405   CO2 28 03/18/2014 1014   CO2 26 12/19/2013 0405   BUN 14.0 03/18/2014 1014   BUN 14 12/19/2013 0405   CREATININE 0.8 03/18/2014 1014   CREATININE 0.87 12/19/2013 0405      Component Value Date/Time   CALCIUM 9.4 03/18/2014 1014   CALCIUM 9.0 12/19/2013 0405   ALKPHOS 111 03/18/2014 1014   ALKPHOS 126* 12/18/2013 1610   AST 30 03/18/2014 1014   AST 17 12/18/2013 1610   ALT 38 03/18/2014 1014   ALT 16 12/18/2013 1610   BILITOT 0.29 03/18/2014 1014   BILITOT 0.4 12/18/2013 1610       RADIOGRAPHIC STUDIES: Ct Chest W Contrast  02/28/2014   CLINICAL DATA:  Subsequent encounter for non-small-cell lung cancer.  EXAM: CT CHEST, ABDOMEN, AND PELVIS WITH CONTRAST  TECHNIQUE: Multidetector CT imaging of the chest, abdomen and pelvis was performed following the standard protocol during bolus administration of intravenous contrast.  CONTRAST:  159mL OMNIPAQUE IOHEXOL 300 MG/ML  SOLN  COMPARISON:  12/27/2013.  FINDINGS: CT CHEST FINDINGS  Soft tissue / Mediastinum: Left-sided Port-A-Cath tip is positioned at the junction of the SVC and RA. There is no axillary lymphadenopathy. The mediastinal lymph nodes seen on the previous study have  decreased in size in the interval. 10 mm short axis prevascular lymph node on the previous study is now 4 mm. The 10 mm precarinal lymph nodes seen previously is now 6 mm in short axis. The 19 mm short axis subcarinal lymph node has decreased in size to 12 mm short axis. Heart size is within normal limits. Coronary artery calcification is noted. No pericardial effusion.  Lungs / Pleura: Postsurgical changes again seen in the right hemi thorax. Central presumed post radiation changes are stable. The small to moderate right pleural effusion has progressed in the interval. Interval slight progression of parenchymal scarring seen towards the right lung base in attention to this region  on followup imaging is recommended.  Bones: Posterior left chest wall lesion has decreased in size in the interval, measuring 1.4 x 4.0 cm today compared to 3.2 x 5.8 cm previously.  CT ABDOMEN AND PELVIS FINDINGS  Hepatobiliary: No focal abnormality is seen in the liver. Mineralized stones are visible within the lumen of the gallbladder. No intrahepatic or extrahepatic biliary dilation.  Pancreas: No focal mass lesion. No dilatation of the main duct. No intraparenchymal cyst. No peripancreatic edema.  Spleen: No splenomegaly. No focal mass lesion.  Adrenals/Urinary Tract: No adrenal nodule or mass. Kidneys are unremarkable.  Stomach/Bowel: Stomach is nondistended. No gastric wall thickening. No evidence of outlet obstruction. Duodenum is normally positioned as is the ligament of Treitz. No small bowel wall thickening. No small bowel dilatation. Terminal ileum normal. Appendix remains mildly distended but shows no periappendiceal inflammation or edema. No gross colonic mass. No colonic wall thickening. No substantial diverticular change.  Vascular/Lymphatic: Atherosclerotic calcification is noted in the wall of the abdominal aorta without aneurysm. No lymphadenopathy in the abdomen or pelvis.  Reproductive: Prostate gland is enlarged.   Other: No intraperitoneal free fluid.  Musculoskeletal: No worrisome lytic or sclerotic bony abnormality in the abdomen or pelvis.  IMPRESSION: Interval decrease in size of the suspicious mediastinal lymph nodes seen on the previous study.  Slight increase in right pleural effusion.  No evidence for metastatic disease in the abdomen or pelvis.   Electronically Signed   By: Misty Stanley M.D.   On: 02/28/2014 16:37   Ct Abdomen Pelvis W Contrast  02/28/2014   CLINICAL DATA:  Subsequent encounter for non-small-cell lung cancer.  EXAM: CT CHEST, ABDOMEN, AND PELVIS WITH CONTRAST  TECHNIQUE: Multidetector CT imaging of the chest, abdomen and pelvis was performed following the standard protocol during bolus administration of intravenous contrast.  CONTRAST:  145mL OMNIPAQUE IOHEXOL 300 MG/ML  SOLN  COMPARISON:  12/27/2013.  FINDINGS: CT CHEST FINDINGS  Soft tissue / Mediastinum: Left-sided Port-A-Cath tip is positioned at the junction of the SVC and RA. There is no axillary lymphadenopathy. The mediastinal lymph nodes seen on the previous study have decreased in size in the interval. 10 mm short axis prevascular lymph node on the previous study is now 4 mm. The 10 mm precarinal lymph nodes seen previously is now 6 mm in short axis. The 19 mm short axis subcarinal lymph node has decreased in size to 12 mm short axis. Heart size is within normal limits. Coronary artery calcification is noted. No pericardial effusion.  Lungs / Pleura: Postsurgical changes again seen in the right hemi thorax. Central presumed post radiation changes are stable. The small to moderate right pleural effusion has progressed in the interval. Interval slight progression of parenchymal scarring seen towards the right lung base in attention to this region on followup imaging is recommended.  Bones: Posterior left chest wall lesion has decreased in size in the interval, measuring 1.4 x 4.0 cm today compared to 3.2 x 5.8 cm previously.  CT ABDOMEN  AND PELVIS FINDINGS  Hepatobiliary: No focal abnormality is seen in the liver. Mineralized stones are visible within the lumen of the gallbladder. No intrahepatic or extrahepatic biliary dilation.  Pancreas: No focal mass lesion. No dilatation of the main duct. No intraparenchymal cyst. No peripancreatic edema.  Spleen: No splenomegaly. No focal mass lesion.  Adrenals/Urinary Tract: No adrenal nodule or mass. Kidneys are unremarkable.  Stomach/Bowel: Stomach is nondistended. No gastric wall thickening. No evidence of outlet obstruction. Duodenum is normally positioned  as is the ligament of Treitz. No small bowel wall thickening. No small bowel dilatation. Terminal ileum normal. Appendix remains mildly distended but shows no periappendiceal inflammation or edema. No gross colonic mass. No colonic wall thickening. No substantial diverticular change.  Vascular/Lymphatic: Atherosclerotic calcification is noted in the wall of the abdominal aorta without aneurysm. No lymphadenopathy in the abdomen or pelvis.  Reproductive: Prostate gland is enlarged.  Other: No intraperitoneal free fluid.  Musculoskeletal: No worrisome lytic or sclerotic bony abnormality in the abdomen or pelvis.  IMPRESSION: Interval decrease in size of the suspicious mediastinal lymph nodes seen on the previous study.  Slight increase in right pleural effusion.  No evidence for metastatic disease in the abdomen or pelvis.   Electronically Signed   By: Misty Stanley M.D.   On: 02/28/2014 16:37    ASSESSMENT/PLAN:  This is a very pleasant 73 years old white male recently diagnosed with a stage IIIA non-small cell lung cancer. He completed a course of concurrent chemoradiation with weekly carboplatin and paclitaxel status post 5 cycle, this was followed by right upper lobectomy with lymph node dissection under the care of Dr. Servando Snare. The final pathology showed positive bronchial resection margin with a pathologic stage ypT3, ypN0. The patient  underwent a course of radiotherapy to the positive resection margin but unfortunately the recent CT scan of the chest showed evidence for disease progression with new lytic rib lesion as well as enlarged mediastinal lymphadenopathy. The MRI of the brain was negative for brain metastasis. He is status post 3 cycles of systemic chemotherapy with carboplatin for AUC of 5 on day 1 and Abraxane 100 mg/M2 on days 1, 8 and 15 every 3 weeks. However his restaging CT scan showed evidence for disease progression. He is now being treated with immunotherapy with Nivolumab. He is status post 5 cycles. The restaging CT scan after 3 cycles revealed interval decrease in the side of the suspicious mediastinal lymph nodes that was seen on the previous study. There was slight increase in the right pleural effusion. There is no evidence for metastatic disease in the abdomen or the pelvis.   He will proceed with cycle #6 today as scheduled. He will follow-up in 2 weeks prior to the start of cycle #7. He was advised to call immediately if he has any concerning symptoms in the interval.  Patient was seen and examined with Dr. Julien Nordmann.   Terry Bussing, NP 03/18/2014  ADDENDUM: Hematology/Oncology Attending: I had a face to face encounter with the patient. I recommended his care plan. This is a very pleasant 73 years old white male with metastatic non-small cell lung cancer, squamous cell carcinoma currently undergoing immunotherapy with Nivolumab status post 5 cycles. He is tolerating his treatment well with no significant adverse effects. He continues to have mild pain on the left side of the chest as well as mild fatigue but in general tolerating his treatment well. I recommended for the patient to proceed with cycle #6 today as scheduled. He will come back for follow-up visit in 2 weeks with the next cycle of his treatment. The patient was advised to call immediately if he has any concerning symptoms in the  interval.  Disclaimer: This note was dictated with voice recognition software. Similar sounding words can inadvertently be transcribed and may be missed upon review. [name]., MD 03/22/2014

## 2014-03-18 NOTE — Patient Instructions (Signed)

## 2014-03-19 ENCOUNTER — Telehealth: Payer: Self-pay | Admitting: Internal Medicine

## 2014-03-19 ENCOUNTER — Telehealth: Payer: Self-pay | Admitting: *Deleted

## 2014-03-19 NOTE — Telephone Encounter (Signed)
Per staff message and POF I have scheduled appts. Advised scheduler of appts. JMW  

## 2014-04-01 ENCOUNTER — Encounter: Payer: Self-pay | Admitting: Oncology

## 2014-04-01 ENCOUNTER — Ambulatory Visit (HOSPITAL_BASED_OUTPATIENT_CLINIC_OR_DEPARTMENT_OTHER): Payer: Medicare Other

## 2014-04-01 ENCOUNTER — Ambulatory Visit (HOSPITAL_BASED_OUTPATIENT_CLINIC_OR_DEPARTMENT_OTHER): Payer: Medicare Other | Admitting: Oncology

## 2014-04-01 ENCOUNTER — Other Ambulatory Visit (HOSPITAL_BASED_OUTPATIENT_CLINIC_OR_DEPARTMENT_OTHER): Payer: Medicare Other

## 2014-04-01 ENCOUNTER — Ambulatory Visit: Payer: Medicare Other

## 2014-04-01 VITALS — BP 117/73 | HR 75 | Temp 97.8°F | Resp 16 | Wt 195.5 lb

## 2014-04-01 DIAGNOSIS — Z5112 Encounter for antineoplastic immunotherapy: Secondary | ICD-10-CM | POA: Diagnosis present

## 2014-04-01 DIAGNOSIS — R5382 Chronic fatigue, unspecified: Secondary | ICD-10-CM

## 2014-04-01 DIAGNOSIS — C3492 Malignant neoplasm of unspecified part of left bronchus or lung: Secondary | ICD-10-CM

## 2014-04-01 DIAGNOSIS — C7951 Secondary malignant neoplasm of bone: Secondary | ICD-10-CM

## 2014-04-01 DIAGNOSIS — C3411 Malignant neoplasm of upper lobe, right bronchus or lung: Secondary | ICD-10-CM | POA: Diagnosis present

## 2014-04-01 DIAGNOSIS — R071 Chest pain on breathing: Secondary | ICD-10-CM

## 2014-04-01 DIAGNOSIS — Z95828 Presence of other vascular implants and grafts: Secondary | ICD-10-CM

## 2014-04-01 LAB — CBC WITH DIFFERENTIAL/PLATELET
BASO%: 0.7 % (ref 0.0–2.0)
Basophils Absolute: 0 10*3/uL (ref 0.0–0.1)
EOS%: 1.9 % (ref 0.0–7.0)
Eosinophils Absolute: 0.1 10*3/uL (ref 0.0–0.5)
HEMATOCRIT: 38.9 % (ref 38.4–49.9)
HGB: 12.6 g/dL — ABNORMAL LOW (ref 13.0–17.1)
LYMPH#: 0.7 10*3/uL — AB (ref 0.9–3.3)
LYMPH%: 14.4 % (ref 14.0–49.0)
MCH: 33.2 pg (ref 27.2–33.4)
MCHC: 32.4 g/dL (ref 32.0–36.0)
MCV: 102.6 fL — ABNORMAL HIGH (ref 79.3–98.0)
MONO#: 0.6 10*3/uL (ref 0.1–0.9)
MONO%: 11.7 % (ref 0.0–14.0)
NEUT#: 3.7 10*3/uL (ref 1.5–6.5)
NEUT%: 71.3 % (ref 39.0–75.0)
Platelets: 205 10*3/uL (ref 140–400)
RBC: 3.79 10*6/uL — AB (ref 4.20–5.82)
RDW: 13.3 % (ref 11.0–14.6)
WBC: 5.2 10*3/uL (ref 4.0–10.3)

## 2014-04-01 LAB — COMPREHENSIVE METABOLIC PANEL (CC13)
ALK PHOS: 109 U/L (ref 40–150)
ALT: 22 U/L (ref 0–55)
ANION GAP: 8 meq/L (ref 3–11)
AST: 26 U/L (ref 5–34)
Albumin: 3.4 g/dL — ABNORMAL LOW (ref 3.5–5.0)
BUN: 13 mg/dL (ref 7.0–26.0)
CO2: 27 meq/L (ref 22–29)
CREATININE: 0.9 mg/dL (ref 0.7–1.3)
Calcium: 9.2 mg/dL (ref 8.4–10.4)
Chloride: 103 mEq/L (ref 98–109)
EGFR: 86 mL/min/{1.73_m2} — ABNORMAL LOW (ref >90)
Glucose: 98 mg/dl (ref 70–140)
Potassium: 4 mEq/L (ref 3.5–5.1)
Sodium: 139 mEq/L (ref 136–145)
Total Bilirubin: 0.35 mg/dL (ref 0.20–1.20)
Total Protein: 6.9 g/dL (ref 6.4–8.3)

## 2014-04-01 LAB — TSH CHCC: TSH: 1.522 m(IU)/L (ref 0.320–4.118)

## 2014-04-01 MED ORDER — OXYCODONE-ACETAMINOPHEN 5-325 MG PO TABS: 1.0000 | ORAL_TABLET | Freq: Four times a day (QID) | ORAL | Status: DC | PRN

## 2014-04-01 MED ORDER — SODIUM CHLORIDE 0.9 % IV SOLN
2.9000 mg/kg | Freq: Once | INTRAVENOUS | Status: AC
  Administered 2014-04-01: 240 mg via INTRAVENOUS
  Filled 2014-04-01: qty 24

## 2014-04-01 MED ORDER — HEPARIN SOD (PORK) LOCK FLUSH 100 UNIT/ML IV SOLN
500.0000 [IU] | Freq: Once | INTRAVENOUS | Status: AC | PRN
  Administered 2014-04-01: 500 [IU]
  Filled 2014-04-01: qty 5

## 2014-04-01 MED ORDER — SODIUM CHLORIDE 0.9 % IV SOLN
Freq: Once | INTRAVENOUS | Status: AC
  Administered 2014-04-01: 13:00:00 via INTRAVENOUS

## 2014-04-01 MED ORDER — HYDROCODONE-HOMATROPINE 5-1.5 MG/5ML PO SYRP: 5.0000 mL | ORAL_SOLUTION | Freq: Four times a day (QID) | ORAL | Status: DC | PRN

## 2014-04-01 MED ORDER — SODIUM CHLORIDE 0.9 % IJ SOLN
10.0000 mL | INTRAMUSCULAR | Status: DC | PRN
  Administered 2014-04-01: 10 mL via INTRAVENOUS
  Filled 2014-04-01: qty 10

## 2014-04-01 MED ORDER — SODIUM CHLORIDE 0.9 % IJ SOLN
10.0000 mL | INTRAMUSCULAR | Status: DC | PRN
  Administered 2014-04-01: 10 mL
  Filled 2014-04-01: qty 10

## 2014-04-01 NOTE — Patient Instructions (Signed)

## 2014-04-01 NOTE — Patient Instructions (Signed)
[facility] Discharge Instructions for Patients Receiving Chemotherapy  Today you received the following chemotherapy agents Nivolomab  To help prevent nausea and vomiting after your treatment, we encourage you to take your nausea medication as prescribed   If you develop nausea and vomiting that is not controlled by your nausea medication, call the clinic.   BELOW ARE SYMPTOMS THAT SHOULD BE REPORTED IMMEDIATELY:  *FEVER GREATER THAN 100.5 F  *CHILLS WITH OR WITHOUT FEVER  NAUSEA AND VOMITING THAT IS NOT CONTROLLED WITH YOUR NAUSEA MEDICATION  *UNUSUAL SHORTNESS OF BREATH  *UNUSUAL BRUISING OR BLEEDING  TENDERNESS IN MOUTH AND THROAT WITH OR WITHOUT PRESENCE OF ULCERS  *URINARY PROBLEMS  *BOWEL PROBLEMS  UNUSUAL RASH Items with * indicate a potential emergency and should be followed up as soon as possible.  Feel free to call the clinic you have any questions or concerns. The clinic phone number is [phone].

## 2014-04-01 NOTE — Progress Notes (Signed)
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   Primary site: Lung (Right)   Staging method: AJCC 7th Edition   Clinical: Stage IIIA (T3, N2, M0) signed by Curt Bears, MD on 05/09/2013  2:56 PM   Summary: Stage IIIA (T3, N2, M0)  PRIOR THERAPY:  1) Concurrent chemoradiation with weekly carboplatin for an AUC of 2 and paclitaxel 45 mg per meter squared. Status post 5 cycles. 2) Bronchoscopy, mediastinoscopy, right posterior lateral thoracotomy with right upper lobectomy, lymph node dissection,  and posterior approach to en bloc chest wall resection, ribs 2, 3, and 4, with positive bronchial resection margin. 3) palliative radiotherapy to the best of resection margin under the care of Dr. Tammi Klippel completed on 10/14/2013. 4) systemic chemotherapy with carboplatin for an AUC of 5 given on day 1 and Abraxane 100 mg/m given days 1, 8 and 15 every 3 weeks status post 3 cycles discontinued 12/31/2013 secondary to disease progression  CURRENT THERAPY: Immunotherapy with Nivolumab 3 mg/kg given every 2 weeks.  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    INTERVAL HISTORY: Terry Robles 74 y.o. male returns for followup visit. Overall is tolerating his treatment with immunotherapy with  Nivolumab without significant difficulty. He denies any change in his baseline shortness of breath, no diarrhea or skin rash. He continues to have some residual numbness/tingling in his toes as well as his right hand from previous chemotherapy. This is stable.  He has a cough which is been long-standing primarily dry although occasionally productive of clear secretions. This is well managed with Hycodan cough syrup at night. He continues to complain of pain in both hips and his right shoulder since the start of treatment. Reports a new sharp pain in his left lower posterior ribs which started about 2 days ago. It was worse yesterday. He has been using Percocet with minimal relief. However, he states his pain is better today. States pain is worse with cough and movement. No shortness of breath related to this pain. He voiced no other specific complaints today.  He has no hemoptysis. He denied having any significant weight loss or night sweats. He has no nausea or vomiting.     MEDICAL HISTORY: Past Medical History  Diagnosis Date  . Hypercholesteremia 05/14/2011  . BPH (benign prostatic hyperplasia) 05/14/2011    takes Uroxatral daily  . GERD (gastroesophageal reflux disease)   . Shortness of breath     weather related  . Pneumonia 1965  . Headache(784.0)     occasionally  . Joint pain   . Maintenance chemotherapy   .  Urinary frequency   . History of shingles   . Anemia   . Acquired thrombocytopenia   . 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 Cancer     squamous cell carcinoma right superior sulcus  . Lung cancer     right lung    ALLERGIES:  has No Known Allergies.  MEDICATIONS:  Current Outpatient Prescriptions  Medication Sig  Dispense Refill  . alfuzosin (UROXATRAL) 10 MG 24 hr tablet Take 10 mg by mouth daily with breakfast.    . cycloSPORINE (RESTASIS) 0.05 % ophthalmic emulsion 1 drop 2 (two) times daily.    Marland Kitchen docusate sodium (COLACE) 100 MG capsule Take 100 mg by mouth daily.    . feeding supplement, RESOURCE BREEZE, (RESOURCE BREEZE) LIQD Take 1 Container by mouth daily at 3 pm.  0  . HYDROcodone-homatropine (HYCODAN) 5-1.5 MG/5ML syrup Take 5 mLs by mouth every 6 (six) hours as needed for cough. 240 mL 0  . Multiple Vitamin (MULITIVITAMIN WITH MINERALS) TABS Take 1 tablet by mouth daily.    Marland Kitchen oxyCODONE-acetaminophen (PERCOCET/ROXICET) 5-325 MG per tablet Take 1 tablet by mouth every 6 (six) hours as needed for severe pain. 90 tablet 0  . polyethylene glycol (MIRALAX / GLYCOLAX) packet Take 17 g by mouth daily.    . prochlorperazine (COMPAZINE) 5 MG tablet Take 5 mg by mouth every 6 (six) hours as needed for nausea or vomiting.     No current facility-administered medications for this visit.   Facility-Administered Medications Ordered in Other Visits  Medication Dose Route Frequency Provider Last Rate Last Dose  . heparin lock flush 100 unit/mL  500 Units Intracatheter Once PRN Curt Bears, MD      . nivolumab (OPDIVO) 250 mg in sodium chloride 0.9 % 100 mL chemo infusion  3 mg/kg (Treatment Plan Actual) Intravenous Once Curt Bears, MD      . sodium chloride 0.9 % injection 10 mL  10 mL Intracatheter PRN Curt Bears, MD        SURGICAL HISTORY:  Past Surgical History  Procedure Laterality Date  . Other surgical history      benign tumor removed from left leg several years ago  . Other surgical history      had melanoma/squamous cell removed previously per patient  . Tumor excision      Left knee  . Colonoscopy w/ biopsies and polypectomy    . Video bronchoscopy N/A 07/30/2013    Procedure: VIDEO BRONCHOSCOPY;  Surgeon: Grace Isaac, MD;  Location: [facility];  Service: Thoracic;  Laterality:  N/A;  . Mediastinoscopy N/A 07/30/2013    Procedure: MEDIASTINOSCOPY;  Surgeon: Grace Isaac, MD;  Location: [facility];  Service: Thoracic;  Laterality: N/A;  . Video assisted thoracoscopy (vats)/wedge resection Right 07/30/2013    Procedure: VIDEO ASSISTED THORACOSCOPY;  Surgeon: Grace Isaac, MD;  Location: [facility];  Service: Thoracic;  Laterality: Right;  . Thoracotomy  07/30/2013    Procedure: THORACOTOMY FOR CHEST WALL RESECTION, RIGHT UPPER LOBECTOMY;  Surgeon: Grace Isaac, MD;  Location: [facility];  Service: Thoracic;;  . Adenoidectomy  as a child  . Tonsillectomy    . Portacath placement Left 10/30/2013    Procedure: INSERTION PORT-A-CATH WITH ULTRASOUND GUIDANCE AND FLUORO;  Surgeon: Grace Isaac, MD;  Location: [facility];  Service: Thoracic;  Laterality: Left;    REVIEW OF SYSTEMS:  Constitutional: positive for fatigue Eyes: negative Ears, nose, mouth, throat, and face: negative  Respiratory: positive for cough and dyspnea on exertion Cardiovascular: negative Gastrointestinal: negative Genitourinary:negative Integument/breast: negative Hematologic/lymphatic: negative Musculoskeletal:positive for arthralgias Neurological: positive for paresthesia Behavioral/Psych: negative Endocrine: negative Allergic/Immunologic: negative   PHYSICAL EXAMINATION: General appearance: alert, cooperative, appears stated age and no distress Head: Normocephalic, without obvious abnormality, atraumatic Neck: no adenopathy, no carotid bruit, no JVD, supple, symmetrical, trachea midline and thyroid not enlarged, symmetric, no tenderness/mass/nodules Lymph nodes: Cervical, supraclavicular, and axillary nodes normal. Resp: clear to auscultation bilaterally Back: symmetric, no curvature. ROM normal. No CVA tenderness. Cardio: regular rate and rhythm, S1, S2 normal, no murmur, click, rub or gallop GI: soft, non-tender; bowel sounds normal; no masses,  no organomegaly Extremities: extremities normal,  atraumatic, no cyanosis or edema and The left forearm and hand are cooler to touch than the right Neurologic: Alert and oriented X 3, normal strength and tone. Normal symmetric reflexes. Normal coordination and gait Skin: Left anterior chest Port-A-Cath incisions are well healed, no evidence of infection  ECOG PERFORMANCE STATUS: 1 - Symptomatic but completely ambulatory  Weight 0 lb (0 kg).  LABORATORY DATA: Lab Results  Component Value Date   WBC 5.2 04/01/2014   HGB 12.6* 04/01/2014   HCT 38.9 04/01/2014   MCV 102.6* 04/01/2014   PLT 205 04/01/2014      Chemistry      Component Value Date/Time   NA 139 04/01/2014 1035   NA 140 12/19/2013 0405   K 4.0 04/01/2014 1035   K 4.2 12/19/2013 0405   CL 104 12/19/2013 0405   CO2 27 04/01/2014 1035   CO2 26 12/19/2013 0405   BUN 13.0 04/01/2014 1035   BUN 14 12/19/2013 0405   CREATININE 0.9 04/01/2014 1035   CREATININE 0.87 12/19/2013 0405      Component Value Date/Time   CALCIUM 9.2 04/01/2014 1035   CALCIUM 9.0 12/19/2013 0405   ALKPHOS 109 04/01/2014 1035   ALKPHOS 126* 12/18/2013 1610   AST 26 04/01/2014 1035   AST 17 12/18/2013 1610   ALT 22 04/01/2014 1035   ALT 16 12/18/2013 1610   BILITOT 0.35 04/01/2014 1035   BILITOT 0.4 12/18/2013 1610       RADIOGRAPHIC STUDIES: No results found.  ASSESSMENT/PLAN:  This is a very pleasant 74 year old white male recently diagnosed with a stage IIIA non-small cell lung cancer. He completed a course of concurrent chemoradiation with weekly carboplatin and paclitaxel status post 5 cycle, this was followed by right upper lobectomy with lymph node dissection under the care of Dr. Servando Snare. The final pathology showed positive bronchial resection margin with a pathologic stage ypT3, ypN0. The patient underwent a course of radiotherapy to the positive resection margin but unfortunately the recent CT scan of the chest showed evidence for disease progression with new lytic rib lesion as  well as enlarged mediastinal lymphadenopathy. The MRI of the brain was negative for brain metastasis. He is status post 3 cycles of systemic chemotherapy with carboplatin for AUC of 5 on day 1 and Abraxane 100 mg/M2 on days 1, 8 and 15 every 3 weeks. However his restaging CT scan showed evidence for disease progression. He is now being treated with immunotherapy with Nivolumab. He is status post 6 cycles. The restaging CT scan after 3 cycles revealed interval decrease in the side of the suspicious mediastinal lymph nodes that was seen on the previous study. There was slight increase in the right pleural effusion. There is no evidence for metastatic disease in the abdomen or the pelvis.  He will proceed with cycle #7 today as scheduled. He will follow-up in 2 weeks prior to the start of cycle #8. His pain is likely due to a pulled muscle. He will continue to use his Percocet and he may use ibuprofen 1-2 tablets per day.  He was advised to call immediately if he has any concerning symptoms in the interval.  Patient was seen and examined with Dr. Julien Nordmann.   Mikey Bussing, NP 04/01/2014  ADDENDUM: Hematology/Oncology Attending: I had a face to face encounter with the patient. I recommended his care plan. This is a very pleasant 74 years old white male with metastatic non-small cell lung cancer, squamous cell carcinoma who is undergoing treatment with immunotherapy with Nivolumab status post 6 cycles. He is tolerating his treatment fairly well with no significant adverse effect but recently reports some new sharp pain in the left lower posterior ribs. The pain is not situated with deep breaths or any shortness of breath. It is reproducible with movement and could be related to pulled muscle in that area. I recommended for him to continue with pain medication at this point including Percocet and ibuprofen 1-2 tablets per day if needed. He was advised to call if no improvement in his pain over the next few  days. We'll proceed with cycle #7 of his treatment today as scheduled and the patient would come back for follow-up visit in 2 weeks with the next cycle of his treatment. He would have imaging studies after cycle #8.  Disclaimer: This note was dictated with voice recognition software. Similar sounding words can inadvertently be transcribed and may be missed upon review. [name]., MD 04/01/2014

## 2014-04-09 ENCOUNTER — Other Ambulatory Visit: Payer: Self-pay | Admitting: Cardiothoracic Surgery

## 2014-04-09 DIAGNOSIS — C3411 Malignant neoplasm of upper lobe, right bronchus or lung: Secondary | ICD-10-CM

## 2014-04-10 ENCOUNTER — Encounter: Payer: Self-pay | Admitting: Cardiothoracic Surgery

## 2014-04-10 ENCOUNTER — Ambulatory Visit (INDEPENDENT_AMBULATORY_CARE_PROVIDER_SITE_OTHER): Payer: Medicare Other | Admitting: Cardiothoracic Surgery

## 2014-04-10 ENCOUNTER — Ambulatory Visit
Admission: RE | Admit: 2014-04-10 | Discharge: 2014-04-10 | Disposition: A | Discharge status: Home / Self Care | Payer: Medicare Other | Source: Ambulatory Visit | Attending: Cardiothoracic Surgery | Admitting: Cardiothoracic Surgery

## 2014-04-10 VITALS — BP 94/58 | HR 88 | Resp 16 | Ht 74.0 in | Wt 195.0 lb

## 2014-04-10 DIAGNOSIS — C3411 Malignant neoplasm of upper lobe, right bronchus or lung: Secondary | ICD-10-CM

## 2014-04-10 DIAGNOSIS — Z902 Acquired absence of lung [part of]: Secondary | ICD-10-CM

## 2014-04-10 DIAGNOSIS — Z9889 Other specified postprocedural states: Secondary | ICD-10-CM

## 2014-04-10 NOTE — Progress Notes (Signed)
[address]       ,[place] [address]             [phone]      Terry Robles [facility] Medical Record #[id number] Date of Birth: [birth year]  Referring: Curt Bears, MD Primary Care: [name], MD  Chief Complaint:   POST OP FOLLOW UP 07/30/2013  OPERATIVE REPORT  PREOPERATIVE DIAGNOSIS: Non-small cell lung cancer, right upper lobe  with chest wall invasion.  POSTOPERATIVE DIAGNOSIS: Non-small cell lung cancer, right upper lobe  with chest wall invasion.  SURGICAL PROCEDURE: Bronchoscopy, mediastinoscopy, right posterior  lateral thoracotomy with right upper lobectomy, lymph node dissection,  and posterior approach to en bloc chest wall resection, ribs 2, 3, and  4.  History of Present Illness:     Patient returns to the office stay in followup after his right upper lobectomy lymph node dissection on block resection of the upper right chest wall and recent admission for rPTX on the right treated with chest tube. Since last seen the patient has been started on immune therapy for his metastatic lung cancer which was not responding to conventional chemotherapy. He notes after the first dose the first dose the left chest wall discomfort improved and has continued to do so. He is still plagued by some dry hacking cough at times.  Past Medical History  Diagnosis Date  . Hypercholesteremia 05/14/2011  . BPH (benign prostatic hyperplasia) 05/14/2011    takes Uroxatral daily  . GERD (gastroesophageal reflux disease)   . Shortness of breath     weather related  . Pneumonia 1965  . Headache(784.0)     occasionally  . Joint pain   . Maintenance chemotherapy   . Urinary frequency   . History of shingles   . Anemia   . Acquired thrombocytopenia   . 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 Cancer     squamous cell carcinoma right superior sulcus  . Lung cancer     right lung     History  Smoking status  . Former Smoker -- 1.00  packs/day for 42 years  . Types: Cigarettes  Smokeless tobacco  . Never Used    Comment: quit smoking almost 53yrs ago    History  Alcohol Use  . Yes    Comment: occasionally beer     No Known Allergies  Current Outpatient Prescriptions  Medication Sig Dispense Refill  . alfuzosin (UROXATRAL) 10 MG 24 hr tablet Take 10 mg by mouth daily with breakfast.    . cycloSPORINE (RESTASIS) 0.05 % ophthalmic emulsion 1 drop 2 (two) times daily.    Marland Kitchen docusate sodium (COLACE) 100 MG capsule Take 100 mg by mouth daily.    . feeding supplement, RESOURCE BREEZE, (RESOURCE BREEZE) LIQD Take 1 Container by mouth daily at 3 pm.  0  . HYDROcodone-homatropine (HYCODAN) 5-1.5 MG/5ML syrup Take 5 mLs by mouth every 6 (six) hours as needed for cough. 240 mL 0  . Multiple Vitamin (MULITIVITAMIN WITH MINERALS) TABS Take 1 tablet by mouth daily.    Marland Kitchen oxyCODONE-acetaminophen (PERCOCET/ROXICET) 5-325 MG per tablet Take 1 tablet by mouth every 6 (six) hours as needed for severe pain. 90 tablet 0  . polyethylene glycol (MIRALAX / GLYCOLAX) packet Take 17 g by mouth daily.    . prochlorperazine (COMPAZINE) 5 MG tablet Take 5 mg by mouth every 6 (six) hours as needed for nausea or vomiting.  No current facility-administered medications for this visit.       Physical Exam: BP 94/58 mmHg  Pulse 88  Resp 16  Ht 6\' 2"  (1.88 m)  Wt 195 lb (88.451 kg)  BMI 25.03 kg/m2  SpO2 98%  General appearance: alert and cooperative Neurologic: intact Heart: regular rate and rhythm, S1, S2 normal, no murmur, click, rub or gallop Lungs: clear to auscultation bilaterally Abdomen: soft, non-tender; bowel sounds normal; no masses,  no organomegaly Extremities: extremities normal, atraumatic, no cyanosis or edema and Homans sign is negative, no sign of DVT Wound: His right thoracotomy incision is well-healed, I do not appreciate cervical supraclavicular or axillary adenopathy,   Diagnostic Studies & Laboratory data:      Recent Radiology Findings:  Dg Chest 2 View  04/10/2014   CLINICAL DATA:  History of right upper lobe carcinoma with lobectomy in March of 2015 followed by radiation and chemotherapy. Followup  EXAM: CHEST  2 VIEW  COMPARISON:  CT chest of 02/28/2014 and chest x-ray of 02/13/2014  FINDINGS: Postoperative changes on the right are stable with volume loss and apical as well as basilar scarring. The left lung is clear. No metastatic involvement of the chest is seen by chest x-ray. The heart is within normal limits in size. A left-sided Port-A-Cath is unchanged with the tip seen to the expected SVC -RA junction. There are degenerative changes throughout the lumbar spine.  IMPRESSION: Stable postoperative changes in the right hemi thorax. No active lung disease.   Electronically Signed   By: Ivar Drape M.D.   On: 04/10/2014 11:47   Dg Chest 2 View  02/13/2014   CLINICAL DATA:  History of right apical squamous cell carcinoma with resection,, interval development of lytic lesion in the left chest wall with chemotherapy, followup  EXAM: CHEST  2 VIEW  COMPARISON:  Chest x-ray of 01/03/2014  FINDINGS: Volume loss in the right hemi thorax is unchanged with apical and basilar pleural thickening. The lytic lesion involving the left posterior seventh rib has diminished in size after interval chemotherapy. No new abnormality is seen. No effusion is noted. The left-sided Port-A-Cath is unchanged in position with the tip near the expected SVC -RA junction. There are diffuse degenerative changes throughout the thoracic spine.  IMPRESSION: 1. Decrease in size of soft tissue mass associated with the lytic lesion of the left posterolateral seventh rib. 2. No change in volume loss on the right secondary to prior surgery for resection of right apical squamous cell carcinoma.   Electronically Signed   By: Ivar Drape M.D.   On: 02/13/2014 13:07    Dg Chest 2 View  01/03/2014   CLINICAL DATA:  Followup pneumothorax.  Lung  cancer  EXAM: CHEST  2 VIEW  COMPARISON:  CT chest 12/27/2013, CXR 12/23/2013  FINDINGS: Postop resection right apex. There is right apical pleural thickening as well as rib resection. No pneumothorax seen on the chest x-ray. Mild right pleural thickening or fluid in the base.  Metastatic disease to the left seventh rib with soft tissue mass and bony destruction, stable.  Negative for heart failure or pneumonia. Port-A-Cath tip remains of the cavoatrial junction.  IMPRESSION: No pneumothorax identified  Metastatic disease left seventh rib is stable.   Electronically Signed   By: Franchot Gallo M.D.   On: 01/03/2014 15:31    Ct Chest W Contrast  12/27/2013   CLINICAL DATA:  Subsequent encounter for right-sided Lung cancer. Squamous cell carcinoma of the right upper lobe.  EXAM: CT CHEST, ABDOMEN, AND PELVIS WITH CONTRAST  TECHNIQUE: Multidetector CT imaging of the chest, abdomen and pelvis was performed following the standard protocol during bolus administration of intravenous contrast.  CONTRAST:  12mL OMNIPAQUE IOHEXOL 300 MG/ML  SOLN  COMPARISON:  PET-CT from 10/18/2013.  Chest CT from 10/11/2013.  FINDINGS: CT CHEST FINDINGS  Soft tissue / Mediastinum: The tip of the left-sided Port-A-Cath is positioned at the SVC/ RA junction. There is no axillary lymphadenopathy. 10 mm prevascular short axis lymph node (image 7 series 2) is new in the interval. 10 mm short axis precarinal lymph node was 5 mm previously. 14 mm short axis subcarinal lymph node on the previous study has increased in size to 19 mm today. No left hilar lymphadenopathy. A amorphous soft tissue in the right hilum is likely related to post treatment change.  3.1 cm posterior lateral left chest wall lesion on the previous study is now 3.2 cm in the same dimension however, when measuring in long axis, this same lesion has increased in size from 3.7 cm previously to 5.8 cm currently.  Tiny chest wall nodule seen on image 42 series 2 today has not  increased in size in the interval, but was hypermetabolic on the previous PET-CT.  Lungs / Pleura: Tiny air-fluid level in the right apex is new in the interval. Airspace opacity in the posterior right upper lobe is stable and may represent postradiation fibrosis. This is underlying the previous on bloc resection.  Lungs are diffusely emphysematous.  Tiny posterior right pleural effusion has has decreased in the interval.  Bones: Bone windows reveal no worrisome lytic or sclerotic osseous lesions.  CT ABDOMEN AND PELVIS FINDINGS  Hepatobiliary: No focal abnormality within the liver parenchyma. Multiple tiny stones are seen within the gallbladder. No intrahepatic or extrahepatic biliary dilation.  Pancreas: No focal mass lesion. No dilatation of the main duct. No intraparenchymal cyst. No peripancreatic edema.  Spleen: No splenomegaly. No focal mass lesion.  Adrenals/Urinary Tract: No adrenal nodule or mass. Kidneys are unremarkable. No ureteral dilatation. Urinary bladder has normal CT imaging features.  Stomach/Bowel: Stomach is nondistended. No gastric wall thickening. No evidence of outlet obstruction. Duodenum is normally positioned as is the ligament of Treitz. No small bowel wall thickening. No small bowel dilatation. Terminal ileum is normal. Appendix measures up to 11 mm in diameter which is technically dilated, but it was 9 mm previously. The lumen is filled with contrast, debris, and air. No evidence for wall thickening or periappendiceal edema/inflammation to suggest acute appendicitis. Mild diverticular change seen in the sigmoid colon without diverticulitis.  Vascular/Lymphatic: Atherosclerotic calcification is noted in the wall of the abdominal aorta without aneurysm. No gastrohepatic or hepatoduodenal ligament lymphadenopathy. No retroperitoneal lymphadenopathy. No pelvic sidewall lymphadenopathy.  Reproductive: Prostate gland is enlarged.  Other: Left inguinal hernia contains only fat. No  intraperitoneal free fluid.  Musculoskeletal: Bone windows reveal no worrisome lytic or sclerotic osseous lesions.  IMPRESSION: Interval progression of mediastinal lymphadenopathy compatible with worsening metastatic disease. Dominant left-sided chest wall lesion also shows clear interval progression.  Slight interval decrease in the small right pleural effusion.  No evidence for metastatic disease in the abdomen or pelvis.   Electronically Signed   By: Misty Stanley M.D.   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 The patient appears to be responding to the current immune therapy, with decreased left chest wall pain and at least by plain chest x-ray a decrease in size of soft tissue lytic lesion. Patient notes he has an additional treatment next week and will have a follow-up CT scan arranged at that time. Plan to see him back as needed   Grace Isaac MD      [address] ,[place] [address] Office [phone]   Beeper [phone]  04/10/2014 12:18 PM

## 2014-04-15 ENCOUNTER — Other Ambulatory Visit (HOSPITAL_BASED_OUTPATIENT_CLINIC_OR_DEPARTMENT_OTHER): Payer: Medicare Other

## 2014-04-15 ENCOUNTER — Ambulatory Visit: Payer: Medicare Other

## 2014-04-15 ENCOUNTER — Encounter: Payer: Self-pay | Admitting: Physician Assistant

## 2014-04-15 ENCOUNTER — Ambulatory Visit (HOSPITAL_BASED_OUTPATIENT_CLINIC_OR_DEPARTMENT_OTHER): Payer: Medicare Other

## 2014-04-15 ENCOUNTER — Ambulatory Visit (HOSPITAL_BASED_OUTPATIENT_CLINIC_OR_DEPARTMENT_OTHER): Payer: Medicare Other | Admitting: Physician Assistant

## 2014-04-15 VITALS — BP 114/69 | HR 79 | Temp 97.8°F | Resp 18 | Ht 74.0 in | Wt 195.6 lb

## 2014-04-15 DIAGNOSIS — Z5112 Encounter for antineoplastic immunotherapy: Secondary | ICD-10-CM | POA: Diagnosis not present

## 2014-04-15 DIAGNOSIS — C7951 Secondary malignant neoplasm of bone: Secondary | ICD-10-CM

## 2014-04-15 DIAGNOSIS — C3411 Malignant neoplasm of upper lobe, right bronchus or lung: Secondary | ICD-10-CM

## 2014-04-15 DIAGNOSIS — C3492 Malignant neoplasm of unspecified part of left bronchus or lung: Secondary | ICD-10-CM

## 2014-04-15 DIAGNOSIS — Z95828 Presence of other vascular implants and grafts: Secondary | ICD-10-CM

## 2014-04-15 LAB — CBC WITH DIFFERENTIAL/PLATELET
BASO%: 0.6 % (ref 0.0–2.0)
BASOS ABS: 0 10*3/uL (ref 0.0–0.1)
EOS%: 1.8 % (ref 0.0–7.0)
Eosinophils Absolute: 0.1 10*3/uL (ref 0.0–0.5)
HCT: 37.5 % — ABNORMAL LOW (ref 38.4–49.9)
HGB: 12.1 g/dL — ABNORMAL LOW (ref 13.0–17.1)
LYMPH#: 0.8 10*3/uL — AB (ref 0.9–3.3)
LYMPH%: 19.5 % (ref 14.0–49.0)
MCH: 32.6 pg (ref 27.2–33.4)
MCHC: 32.2 g/dL (ref 32.0–36.0)
MCV: 101.2 fL — ABNORMAL HIGH (ref 79.3–98.0)
MONO#: 0.4 10*3/uL (ref 0.1–0.9)
MONO%: 11 % (ref 0.0–14.0)
NEUT%: 67.1 % (ref 39.0–75.0)
NEUTROS ABS: 2.6 10*3/uL (ref 1.5–6.5)
PLATELETS: 182 10*3/uL (ref 140–400)
RBC: 3.7 10*6/uL — ABNORMAL LOW (ref 4.20–5.82)
RDW: 13 % (ref 11.0–14.6)
WBC: 3.9 10*3/uL — AB (ref 4.0–10.3)

## 2014-04-15 LAB — COMPREHENSIVE METABOLIC PANEL (CC13)
ALBUMIN: 3.4 g/dL — AB (ref 3.5–5.0)
ALK PHOS: 105 U/L (ref 40–150)
ALT: 24 U/L (ref 0–55)
ANION GAP: 8 meq/L (ref 3–11)
AST: 23 U/L (ref 5–34)
BUN: 14.1 mg/dL (ref 7.0–26.0)
CO2: 26 mEq/L (ref 22–29)
CREATININE: 0.9 mg/dL (ref 0.7–1.3)
Calcium: 8.9 mg/dL (ref 8.4–10.4)
Chloride: 104 mEq/L (ref 98–109)
EGFR: 85 mL/min/{1.73_m2} — AB (ref >90)
GLUCOSE: 93 mg/dL (ref 70–140)
POTASSIUM: 4 meq/L (ref 3.5–5.1)
Sodium: 139 mEq/L (ref 136–145)
Total Bilirubin: 0.36 mg/dL (ref 0.20–1.20)
Total Protein: 6.6 g/dL (ref 6.4–8.3)

## 2014-04-15 MED ORDER — SODIUM CHLORIDE 0.9 % IV SOLN
Freq: Once | INTRAVENOUS | Status: AC
  Administered 2014-04-15: 13:00:00 via INTRAVENOUS

## 2014-04-15 MED ORDER — SODIUM CHLORIDE 0.9 % IV SOLN
240.0000 mg | Freq: Once | INTRAVENOUS | Status: AC
  Administered 2014-04-15: 240 mg via INTRAVENOUS
  Filled 2014-04-15: qty 24

## 2014-04-15 MED ORDER — SODIUM CHLORIDE 0.9 % IJ SOLN
10.0000 mL | INTRAMUSCULAR | Status: DC | PRN
  Administered 2014-04-15: 10 mL via INTRAVENOUS
  Filled 2014-04-15: qty 10

## 2014-04-15 MED ORDER — HEPARIN SOD (PORK) LOCK FLUSH 100 UNIT/ML IV SOLN
500.0000 [IU] | Freq: Once | INTRAVENOUS | Status: AC | PRN
  Administered 2014-04-15: 500 [IU]
  Filled 2014-04-15: qty 5

## 2014-04-15 MED ORDER — SODIUM CHLORIDE 0.9 % IJ SOLN
10.0000 mL | INTRAMUSCULAR | Status: DC | PRN
  Administered 2014-04-15: 10 mL
  Filled 2014-04-15: qty 10

## 2014-04-15 NOTE — Patient Instructions (Signed)
Follow-up in 2 weeks with a restaging CT scan of your chest, abdomen and pelvis to reevaluate your disease, prior to your next scheduled cycle of immunotherapy

## 2014-04-15 NOTE — Patient Instructions (Signed)

## 2014-04-15 NOTE — Patient Instructions (Signed)
[facility] Discharge Instructions for Patients Receiving Chemotherapy  Today you received the following chemotherapy agents: Nivolumab.  To help prevent nausea and vomiting after your treatment, we encourage you to take your nausea medication: Compazine 5 mg every 6 hours as needed.   If you develop nausea and vomiting that is not controlled by your nausea medication, call the clinic.   BELOW ARE SYMPTOMS THAT SHOULD BE REPORTED IMMEDIATELY:  *FEVER GREATER THAN 100.5 F  *CHILLS WITH OR WITHOUT FEVER  NAUSEA AND VOMITING THAT IS NOT CONTROLLED WITH YOUR NAUSEA MEDICATION  *UNUSUAL SHORTNESS OF BREATH  *UNUSUAL BRUISING OR BLEEDING  TENDERNESS IN MOUTH AND THROAT WITH OR WITHOUT PRESENCE OF ULCERS  *URINARY PROBLEMS  *BOWEL PROBLEMS  UNUSUAL RASH Items with * indicate a potential emergency and should be followed up as soon as possible.  Feel free to call the clinic you have any questions or concerns. The clinic phone number is [phone].

## 2014-04-15 NOTE — Progress Notes (Addendum)
Terry Robles  [phone] [phone]  SHARED VISIT PROGRESS NOTE  [name], MD 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   Primary site: Lung (Right)   Staging method: AJCC 7th Edition   Clinical: Stage IIIA (T3, N2, M0) signed by Curt Bears, MD on 05/09/2013  2:56 PM   Summary: Stage IIIA (T3, N2, M0)  PRIOR THERAPY:  1) Concurrent chemoradiation with weekly carboplatin for an AUC of 2 and paclitaxel 45 mg per meter squared. Status post 5 cycles. 2) Bronchoscopy, mediastinoscopy, right posterior lateral thoracotomy with right upper lobectomy, lymph node dissection,  and posterior approach to en bloc chest wall resection, ribs 2, 3, and 4, with positive bronchial resection margin. 3) palliative radiotherapy to the best of resection margin under the care of Dr. Tammi Klippel completed on 10/14/2013. 4) systemic chemotherapy with carboplatin for an AUC of 5 given on day 1 and Abraxane 100 mg/m given days 1, 8 and 15 every 3 weeks status post 3 cycles discontinued 12/31/2013 secondary to disease progression  CURRENT THERAPY: Immunotherapy with Nivolumab 3 mg/kg given every 2 weeks.  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    INTERVAL HISTORY: Terry Robles 74 y.o. male returns for followup visit. Overall is tolerating his treatment with immunotherapy with  Nivolumab without significant difficulty. He denies any change in his baseline shortness of breath, no diarrhea or skin rash. He continues to have some residual numbness/tingling in his toes as well as his right hand from previous chemotherapy. This remains stable.  He has a cough which is been long-standing primarily dry although occasionally productive of clear secretions. This is well managed with Delsym cough syrup during the day and Hycodan cough syrup at night. He continues to complain of pain in both hips and his right shoulder since the start of treatment. His been taking Percocet to 3 times a day with reasonable relief. He voiced no other specific complaints today.  He has no hemoptysis. He denied having any significant weight loss or night sweats. He has no nausea or vomiting.     MEDICAL HISTORY: Past Medical History  Diagnosis Date  . Hypercholesteremia 05/14/2011  . BPH (benign prostatic hyperplasia) 05/14/2011    takes Uroxatral daily  . GERD (gastroesophageal reflux disease)   . Shortness of breath     weather related  . Pneumonia 1965  . Headache(784.0)     occasionally  . Joint pain   . Maintenance chemotherapy   . Urinary frequency   . History of shingles   . Anemia   . Acquired thrombocytopenia   . 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  Cancer     squamous cell carcinoma right superior sulcus  . Lung cancer     right lung    ALLERGIES:  has No Known Allergies.  MEDICATIONS:  Current Outpatient Prescriptions  Medication Sig Dispense Refill  . alfuzosin (UROXATRAL) 10 MG 24 hr tablet Take 10 mg by mouth daily with breakfast.    . cycloSPORINE (RESTASIS) 0.05 % ophthalmic emulsion 1 drop 2 (two) times daily.     Marland Kitchen docusate sodium (COLACE) 100 MG capsule Take 100 mg by mouth daily.    . feeding supplement, RESOURCE BREEZE, (RESOURCE BREEZE) LIQD Take 1 Container by mouth daily at 3 pm.  0  . HYDROcodone-homatropine (HYCODAN) 5-1.5 MG/5ML syrup Take 5 mLs by mouth every 6 (six) hours as needed for cough. 240 mL 0  . Multiple Vitamin (MULITIVITAMIN WITH MINERALS) TABS Take 1 tablet by mouth daily.    Marland Kitchen oxyCODONE-acetaminophen (PERCOCET/ROXICET) 5-325 MG per tablet Take 1 tablet by mouth every 6 (six) hours as needed for severe pain. 90 tablet 0  . polyethylene glycol (MIRALAX / GLYCOLAX) packet Take 17 g by mouth daily.    . prochlorperazine (COMPAZINE) 5 MG tablet Take 5 mg by mouth every 6 (six) hours as needed for nausea or vomiting.     No current facility-administered medications for this visit.   Facility-Administered Medications Ordered in Other Visits  Medication Dose Route Frequency Provider Last Rate Last Dose  . sodium chloride 0.9 % injection 10 mL  10 mL Intracatheter PRN Curt Bears, MD   10 mL at 04/15/14 1440    SURGICAL HISTORY:  Past Surgical History  Procedure Laterality Date  . Other surgical history      benign tumor removed from left leg several years ago  . Other surgical history      had melanoma/squamous cell removed previously per patient  . Tumor excision      Left knee  . Colonoscopy w/ biopsies and polypectomy    . Video bronchoscopy N/A 07/30/2013    Procedure: VIDEO BRONCHOSCOPY;  Surgeon: Grace Isaac, MD;  Location: [facility];  Service: Thoracic;  Laterality: N/A;  . Mediastinoscopy N/A 07/30/2013    Procedure: MEDIASTINOSCOPY;  Surgeon: Grace Isaac, MD;  Location: [facility];  Service: Thoracic;  Laterality: N/A;  . Video assisted thoracoscopy (vats)/wedge resection Right 07/30/2013    Procedure: VIDEO ASSISTED THORACOSCOPY;  Surgeon: Grace Isaac, MD;  Location: [facility];  Service: Thoracic;  Laterality: Right;  . Thoracotomy  07/30/2013    Procedure:  THORACOTOMY FOR CHEST WALL RESECTION, RIGHT UPPER LOBECTOMY;  Surgeon: Grace Isaac, MD;  Location: [facility];  Service: Thoracic;;  . Adenoidectomy  as a child  . Tonsillectomy    . Portacath placement Left 10/30/2013    Procedure: INSERTION PORT-A-CATH WITH ULTRASOUND GUIDANCE AND FLUORO;  Surgeon: Grace Isaac, MD;  Location: [facility];  Service: Thoracic;  Laterality: Left;    REVIEW OF SYSTEMS:  Constitutional: positive for fatigue Eyes: negative Ears, nose, mouth, throat, and face: negative Respiratory: positive for cough and dyspnea on exertion Cardiovascular: negative Gastrointestinal: negative Genitourinary:negative Integument/breast: negative Hematologic/lymphatic: negative Musculoskeletal:positive for arthralgias Neurological: positive for paresthesia Behavioral/Psych: negative Endocrine: negative Allergic/Immunologic: negative   PHYSICAL EXAMINATION: General appearance: alert, cooperative, appears stated age and no distress Head: Normocephalic, without obvious abnormality, atraumatic Neck: no adenopathy, no carotid bruit, no JVD, supple, symmetrical, trachea midline and thyroid not enlarged, symmetric, no tenderness/mass/nodules Lymph nodes: Cervical, supraclavicular, and axillary nodes normal. Resp: clear to auscultation  bilaterally Back: symmetric, no curvature. ROM normal. No CVA tenderness. Cardio: regular rate and rhythm, S1, S2 normal, no murmur, click, rub or gallop GI: soft, non-tender; bowel sounds normal; no masses,  no organomegaly Extremities: extremities normal, atraumatic, no cyanosis or edema and The left forearm and hand are cooler to touch than the right Neurologic: Alert and oriented X 3, normal strength and tone. Normal symmetric reflexes. Normal coordination and gait Skin: Left anterior chest Port-A-Cath incisions are well healed, no evidence of infection  ECOG PERFORMANCE STATUS: 1 - Symptomatic but completely ambulatory  Blood pressure 114/69,  pulse 79, temperature 97.8 F (36.6 C), temperature source Oral, resp. rate 18, height 6\' 2"  (1.88 m), weight 195 lb 9.6 oz (88.724 kg).  LABORATORY DATA: Lab Results  Component Value Date   WBC 3.9* 04/15/2014   HGB 12.1* 04/15/2014   HCT 37.5* 04/15/2014   MCV 101.2* 04/15/2014   PLT 182 04/15/2014      Chemistry      Component Value Date/Time   NA 139 04/15/2014 1011   NA 140 12/19/2013 0405   K 4.0 04/15/2014 1011   K 4.2 12/19/2013 0405   CL 104 12/19/2013 0405   CO2 26 04/15/2014 1011   CO2 26 12/19/2013 0405   BUN 14.1 04/15/2014 1011   BUN 14 12/19/2013 0405   CREATININE 0.9 04/15/2014 1011   CREATININE 0.87 12/19/2013 0405      Component Value Date/Time   CALCIUM 8.9 04/15/2014 1011   CALCIUM 9.0 12/19/2013 0405   ALKPHOS 105 04/15/2014 1011   ALKPHOS 126* 12/18/2013 1610   AST 23 04/15/2014 1011   AST 17 12/18/2013 1610   ALT 24 04/15/2014 1011   ALT 16 12/18/2013 1610   BILITOT 0.36 04/15/2014 1011   BILITOT 0.4 12/18/2013 1610       RADIOGRAPHIC STUDIES: Dg Chest 2 View  04/10/2014   CLINICAL DATA:  History of right upper lobe carcinoma with lobectomy in March of 2015 followed by radiation and chemotherapy. Followup  EXAM: CHEST  2 VIEW  COMPARISON:  CT chest of 02/28/2014 and chest x-ray of 02/13/2014  FINDINGS: Postoperative changes on the right are stable with volume loss and apical as well as basilar scarring. The left lung is clear. No metastatic involvement of the chest is seen by chest x-ray. The heart is within normal limits in size. A left-sided Port-A-Cath is unchanged with the tip seen to the expected SVC -RA junction. There are degenerative changes throughout the lumbar spine.  IMPRESSION: Stable postoperative changes in the right hemi thorax. No active lung disease.   Electronically Signed   By: Ivar Drape M.D.   On: 04/10/2014 11:47    ASSESSMENT/PLAN:  This is a very pleasant 74 years old white male recently diagnosed with a stage IIIA  non-small cell lung cancer. He completed a course of concurrent chemoradiation with weekly carboplatin and paclitaxel status post 5 cycle, this was followed by right upper lobectomy with lymph node dissection under the care of Dr. Servando Snare. The final pathology showed positive bronchial resection margin with a pathologic stage ypT3, ypN0. The patient underwent a course of radiotherapy to the positive resection margin but unfortunately the recent CT scan of the chest showed evidence for disease progression with new lytic rib lesion as well as enlarged mediastinal lymphadenopathy. The MRI of the brain was negative for brain metastasis. He is status post 3 cycles of systemic chemotherapy with carboplatin for AUC of 5 on day 1 and Abraxane 100  mg/M2 on days 1, 8 and 15 every 3 weeks. However his restaging CT scan showed evidence for disease progression. He is now being treated with immunotherapy with Nivolumab. He is status post 7 cycles. The restaging CT scan after 3 cycles revealed interval decrease in the side of the suspicious mediastinal lymph nodes that was seen on the previous study. There was slight increase in the right pleural effusion. There is no evidence for metastatic disease in the abdomen or the pelvis.   He will proceed with cycle #8 today as scheduled. He will follow-up in 2 weeks prior to the start of cycle #9 with a restaging CT scan of chest, abdomen and pelvis with contrast to reevaluate his disease. He was advised to call immediately if he has any concerning symptoms in the interval.  Patient was seen and examined with Dr. Julien Nordmann.   Carlton Adam, PA-C 04/15/2014  ADDENDUM:  Hematology/Oncology Attending:  I had a face to face encounter with the patient today. I recommended his care plan. This is a very pleasant 74 years old white male with metastatic non-small cell lung cancer, squamous cell carcinoma currently undergoing immunotherapy with Nivolumab status post 7 cycles. He is  tolerating his treatment fairly well with no significant adverse effects. We'll proceed with cycle #8 today as scheduled. The patient would come back for follow-up visit in 2 weeks for reevaluation with repeat CT scan of the chest, abdomen and pelvis for restaging of his disease. He was advised to call immediately if he has any concerning symptoms in the interval.  Disclaimer: This note was dictated with voice recognition software. Similar sounding words can inadvertently be transcribed and may be missed upon review. [name]., MD 04/15/2014

## 2014-04-16 ENCOUNTER — Other Ambulatory Visit: Payer: Self-pay | Admitting: *Deleted

## 2014-04-25 ENCOUNTER — Encounter (HOSPITAL_COMMUNITY): Payer: Self-pay

## 2014-04-25 ENCOUNTER — Ambulatory Visit (HOSPITAL_COMMUNITY)
Admission: RE | Admit: 2014-04-25 | Discharge: 2014-04-25 | Disposition: A | Discharge status: Home / Self Care | Payer: Medicare Other | Source: Ambulatory Visit | Attending: Physician Assistant | Admitting: Physician Assistant

## 2014-04-25 DIAGNOSIS — C3411 Malignant neoplasm of upper lobe, right bronchus or lung: Secondary | ICD-10-CM | POA: Diagnosis not present

## 2014-04-25 DIAGNOSIS — Z87891 Personal history of nicotine dependence: Secondary | ICD-10-CM | POA: Diagnosis not present

## 2014-04-25 MED ORDER — IOHEXOL 300 MG/ML  SOLN
100.0000 mL | Freq: Once | INTRAMUSCULAR | Status: AC | PRN
  Administered 2014-04-25: 100 mL via INTRAVENOUS

## 2014-04-29 ENCOUNTER — Ambulatory Visit (HOSPITAL_BASED_OUTPATIENT_CLINIC_OR_DEPARTMENT_OTHER): Payer: Medicare Other | Admitting: Physician Assistant

## 2014-04-29 ENCOUNTER — Other Ambulatory Visit: Payer: Self-pay

## 2014-04-29 ENCOUNTER — Encounter: Payer: Self-pay | Admitting: Physician Assistant

## 2014-04-29 ENCOUNTER — Other Ambulatory Visit (HOSPITAL_BASED_OUTPATIENT_CLINIC_OR_DEPARTMENT_OTHER): Payer: Medicare Other

## 2014-04-29 ENCOUNTER — Ambulatory Visit: Payer: Medicare Other

## 2014-04-29 ENCOUNTER — Ambulatory Visit (HOSPITAL_BASED_OUTPATIENT_CLINIC_OR_DEPARTMENT_OTHER): Payer: Medicare Other

## 2014-04-29 VITALS — BP 104/57 | HR 88 | Temp 98.4°F | Resp 18 | Ht 74.0 in | Wt 196.7 lb

## 2014-04-29 DIAGNOSIS — C3411 Malignant neoplasm of upper lobe, right bronchus or lung: Secondary | ICD-10-CM

## 2014-04-29 DIAGNOSIS — R5383 Other fatigue: Secondary | ICD-10-CM

## 2014-04-29 DIAGNOSIS — Z5112 Encounter for antineoplastic immunotherapy: Secondary | ICD-10-CM

## 2014-04-29 DIAGNOSIS — C3492 Malignant neoplasm of unspecified part of left bronchus or lung: Secondary | ICD-10-CM

## 2014-04-29 DIAGNOSIS — C7951 Secondary malignant neoplasm of bone: Secondary | ICD-10-CM

## 2014-04-29 DIAGNOSIS — Z95828 Presence of other vascular implants and grafts: Secondary | ICD-10-CM

## 2014-04-29 DIAGNOSIS — Z79899 Other long term (current) drug therapy: Secondary | ICD-10-CM

## 2014-04-29 DIAGNOSIS — R5382 Chronic fatigue, unspecified: Secondary | ICD-10-CM

## 2014-04-29 LAB — CBC WITH DIFFERENTIAL/PLATELET
BASO%: 0.5 % (ref 0.0–2.0)
BASOS ABS: 0 10*3/uL (ref 0.0–0.1)
EOS%: 2.8 % (ref 0.0–7.0)
Eosinophils Absolute: 0.1 10*3/uL (ref 0.0–0.5)
HCT: 37.9 % — ABNORMAL LOW (ref 38.4–49.9)
HGB: 12.6 g/dL — ABNORMAL LOW (ref 13.0–17.1)
LYMPH#: 0.8 10*3/uL — AB (ref 0.9–3.3)
LYMPH%: 19.5 % (ref 14.0–49.0)
MCH: 33.5 pg — ABNORMAL HIGH (ref 27.2–33.4)
MCHC: 33.3 g/dL (ref 32.0–36.0)
MCV: 100.6 fL — AB (ref 79.3–98.0)
MONO#: 0.7 10*3/uL (ref 0.1–0.9)
MONO%: 15.4 % — AB (ref 0.0–14.0)
NEUT#: 2.6 10*3/uL (ref 1.5–6.5)
NEUT%: 61.8 % (ref 39.0–75.0)
PLATELETS: 206 10*3/uL (ref 140–400)
RBC: 3.77 10*6/uL — ABNORMAL LOW (ref 4.20–5.82)
RDW: 13.5 % (ref 11.0–14.6)
WBC: 4.3 10*3/uL (ref 4.0–10.3)

## 2014-04-29 LAB — COMPREHENSIVE METABOLIC PANEL (CC13)
ALBUMIN: 3.3 g/dL — AB (ref 3.5–5.0)
ALT: 21 U/L (ref 0–55)
AST: 19 U/L (ref 5–34)
Alkaline Phosphatase: 103 U/L (ref 40–150)
Anion Gap: 8 mEq/L (ref 3–11)
BUN: 18.1 mg/dL (ref 7.0–26.0)
CO2: 26 mEq/L (ref 22–29)
CREATININE: 0.9 mg/dL (ref 0.7–1.3)
Calcium: 8.8 mg/dL (ref 8.4–10.4)
Chloride: 105 mEq/L (ref 98–109)
EGFR: 86 mL/min/{1.73_m2} — ABNORMAL LOW (ref >90)
GLUCOSE: 103 mg/dL (ref 70–140)
Potassium: 4.4 mEq/L (ref 3.5–5.1)
Sodium: 139 mEq/L (ref 136–145)
Total Bilirubin: 0.41 mg/dL (ref 0.20–1.20)
Total Protein: 6.6 g/dL (ref 6.4–8.3)

## 2014-04-29 LAB — TSH CHCC: TSH: 2.867 m(IU)/L (ref 0.320–4.118)

## 2014-04-29 MED ORDER — SODIUM CHLORIDE 0.9 % IV SOLN
2.8000 mg/kg | Freq: Once | INTRAVENOUS | Status: AC
  Administered 2014-04-29: 240 mg via INTRAVENOUS
  Filled 2014-04-29: qty 24

## 2014-04-29 MED ORDER — OXYCODONE-ACETAMINOPHEN 5-325 MG PO TABS: 1.0000 | ORAL_TABLET | Freq: Four times a day (QID) | ORAL | Status: DC | PRN

## 2014-04-29 MED ORDER — SODIUM CHLORIDE 0.9 % IJ SOLN
10.0000 mL | INTRAMUSCULAR | Status: DC | PRN
  Administered 2014-04-29: 10 mL
  Filled 2014-04-29: qty 10

## 2014-04-29 MED ORDER — HEPARIN SOD (PORK) LOCK FLUSH 100 UNIT/ML IV SOLN
500.0000 [IU] | Freq: Once | INTRAVENOUS | Status: AC | PRN
Start: 2014-04-29 — End: 2014-04-29
  Administered 2014-04-29: 500 [IU]
  Filled 2014-04-29: qty 5

## 2014-04-29 MED ORDER — SODIUM CHLORIDE 0.9 % IJ SOLN
10.0000 mL | INTRAMUSCULAR | Status: DC | PRN
  Administered 2014-04-29: 10 mL via INTRAVENOUS
  Filled 2014-04-29: qty 10

## 2014-04-29 MED ORDER — HYDROCODONE-HOMATROPINE 5-1.5 MG/5ML PO SYRP: 5.0000 mL | ORAL_SOLUTION | Freq: Four times a day (QID) | ORAL | Status: DC | PRN

## 2014-04-29 MED ORDER — SODIUM CHLORIDE 0.9 % IV SOLN
Freq: Once | INTRAVENOUS | Status: AC
  Administered 2014-04-29: 16:00:00 via INTRAVENOUS

## 2014-04-29 NOTE — Patient Instructions (Signed)

## 2014-04-29 NOTE — Patient Instructions (Signed)
Your recent restaging CT scan showed significant improvement in your disease with almost complete resolution of the left chest wall mass Follow-up in 2 weeks prior to your next scheduled cycle of immunotherapy

## 2014-04-29 NOTE — Progress Notes (Addendum)
[place]  [phone]) [phone] Fax:(336) [phone]  SHARED VISIT PROGRESS NOTE  [name], MD 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   Primary site: Lung (Right)   Staging method: AJCC 7th Edition   Clinical: Stage IIIA (T3, N2, M0) signed by Curt Bears, MD on 05/09/2013  2:56 PM   Summary: Stage IIIA (T3, N2, M0)  PRIOR THERAPY:  1) Concurrent chemoradiation with weekly carboplatin for an AUC of 2 and paclitaxel 45 mg per meter squared. Status post 5 cycles. 2) Bronchoscopy, mediastinoscopy, right posterior lateral thoracotomy with right upper lobectomy, lymph node dissection,  and posterior approach to en bloc chest wall resection, ribs 2, 3, and 4, with positive bronchial resection margin. 3) palliative radiotherapy to the best of resection margin under the care of Dr. Tammi Klippel completed on 10/14/2013. 4) systemic chemotherapy with carboplatin for an AUC of 5 given on day 1 and Abraxane 100 mg/m given days 1, 8 and 15 every 3 weeks status post 3 cycles discontinued 12/31/2013 secondary to disease progression  CURRENT THERAPY: Immunotherapy with Nivolumab 3 mg/kg given every 2 weeks.  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    INTERVAL HISTORY: Terry Robles 74 y.o. male returns for followup visit. Overall is tolerating his treatment with immunotherapy with  Nivolumab without significant difficulty. He denies any change in his baseline shortness of breath, no diarrhea or skin rash. He continues to have some residual numbness/tingling in his toes as well as his right hand from previous chemotherapy. This remains stable.  He has a cough which is been long-standing primarily dry although occasionally productive of clear secretions. This is well managed with Delsym cough syrup during the day and Hycodan cough syrup at night. He continues to complain of pain in both hips and his right shoulder since the start of treatment. His been taking Percocet to 3 times a day with reasonable relief. He voiced no other specific complaints today.  He has no hemoptysis. He denied having any significant weight loss or night sweats. He has no nausea or vomiting. He will crest refill for both his Hycodan cough syrup in his Percocet tablets. He recently had a restaging CT scan of the chest, abdomen and pelvis with contrast to reevaluate his disease and presents to discuss results. He is also here to proceed with cycle #9 of his immunotherapy with Nivolumab.    MEDICAL HISTORY: Past Medical History  Diagnosis Date  . Hypercholesteremia 05/14/2011  . BPH (benign prostatic hyperplasia) 05/14/2011    takes Uroxatral daily  . GERD (gastroesophageal reflux disease)   . Shortness of breath     weather related  . Pneumonia 1965  .  Headache(784.0)     occasionally  . Joint pain   . Maintenance chemotherapy   . Urinary frequency   . History of shingles   . Anemia   . Acquired thrombocytopenia   . 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 Cancer     squamous cell carcinoma right superior sulcus  . Lung cancer     right lung     ALLERGIES:  has No Known Allergies.  MEDICATIONS:  Current Outpatient Prescriptions  Medication Sig Dispense Refill  . alfuzosin (UROXATRAL) 10 MG 24 hr tablet Take 10 mg by mouth daily with breakfast.    . cycloSPORINE (RESTASIS) 0.05 % ophthalmic emulsion 1 drop 2 (two) times daily.    Marland Kitchen docusate sodium (COLACE) 100 MG capsule Take 100 mg by mouth daily.    . feeding supplement, RESOURCE BREEZE, (RESOURCE BREEZE) LIQD Take 1 Container by mouth daily at 3 pm.  0  . Multiple Vitamin (MULITIVITAMIN WITH MINERALS) TABS Take 1 tablet by mouth daily.    . polyethylene glycol (MIRALAX / GLYCOLAX) packet Take 17 g by mouth daily.    . prochlorperazine (COMPAZINE) 5 MG tablet Take 5 mg by mouth every 6 (six) hours as needed for nausea or vomiting.    Marland Kitchen HYDROcodone-homatropine (HYCODAN) 5-1.5 MG/5ML syrup Take 5 mLs by mouth every 6 (six) hours as needed for cough. 240 mL 0  . oxyCODONE-acetaminophen (PERCOCET/ROXICET) 5-325 MG per tablet Take 1 tablet by mouth every 6 (six) hours as needed for severe pain. 90 tablet 0   No current facility-administered medications for this visit.   Facility-Administered Medications Ordered in Other Visits  Medication Dose Route Frequency Provider Last Rate Last Dose  . sodium chloride 0.9 % injection 10 mL  10 mL Intracatheter PRN Curt Bears, MD   10 mL at 04/29/14 1646    SURGICAL HISTORY:  Past Surgical History  Procedure Laterality Date  . Other surgical history      benign tumor removed from left leg several years ago  . Other surgical history      had melanoma/squamous cell removed previously per patient  . Tumor excision      Left knee  . Colonoscopy w/ biopsies and polypectomy    . Video bronchoscopy N/A 07/30/2013    Procedure: VIDEO BRONCHOSCOPY;  Surgeon: Grace Isaac, MD;  Location: [facility];  Service: Thoracic;  Laterality: N/A;  . Mediastinoscopy N/A 07/30/2013    Procedure: MEDIASTINOSCOPY;  Surgeon: Grace Isaac, MD;   Location: [facility];  Service: Thoracic;  Laterality: N/A;  . Video assisted thoracoscopy (vats)/wedge resection Right 07/30/2013    Procedure: VIDEO ASSISTED THORACOSCOPY;  Surgeon: Grace Isaac, MD;  Location: [facility];  Service: Thoracic;  Laterality: Right;  . Thoracotomy  07/30/2013    Procedure: THORACOTOMY FOR CHEST WALL RESECTION, RIGHT UPPER LOBECTOMY;  Surgeon: Grace Isaac, MD;  Location: [facility];  Service: Thoracic;;  . Adenoidectomy  as a child  . Tonsillectomy    . Portacath placement Left 10/30/2013    Procedure: INSERTION PORT-A-CATH WITH ULTRASOUND GUIDANCE AND FLUORO;  Surgeon: Grace Isaac, MD;  Location: [facility];  Service: Thoracic;  Laterality: Left;    REVIEW OF SYSTEMS:  Constitutional: positive for fatigue Eyes: negative Ears, nose, mouth, throat, and face: negative Respiratory: positive for cough and dyspnea on exertion Cardiovascular: negative Gastrointestinal: negative Genitourinary:negative Integument/breast: negative Hematologic/lymphatic: negative Musculoskeletal:positive for arthralgias Neurological: positive for paresthesia Behavioral/Psych: negative Endocrine: negative Allergic/Immunologic:  negative   PHYSICAL EXAMINATION: General appearance: alert, cooperative, appears stated age and no distress Head: Normocephalic, without obvious abnormality, atraumatic Neck: no adenopathy, no carotid bruit, no JVD, supple, symmetrical, trachea midline and thyroid not enlarged, symmetric, no tenderness/mass/nodules Lymph nodes: Cervical, supraclavicular, and axillary nodes normal. Resp: clear to auscultation bilaterally Back: symmetric, no curvature. ROM normal. No CVA tenderness. Cardio: regular rate and rhythm, S1, S2 normal, no murmur, click, rub or gallop GI: soft, non-tender; bowel sounds normal; no masses,  no organomegaly Extremities: extremities normal, atraumatic, no cyanosis or edema and The left forearm and hand are cooler to touch than the  right Neurologic: Alert and oriented X 3, normal strength and tone. Normal symmetric reflexes. Normal coordination and gait Skin: Left anterior chest Port-A-Cath incisions are well healed, no evidence of infection  ECOG PERFORMANCE STATUS: 1 - Symptomatic but completely ambulatory  Blood pressure 104/57, pulse 88, temperature 98.4 F (36.9 C), temperature source Oral, resp. rate 18, height 6\' 2"  (1.88 m), weight 196 lb 11.2 oz (89.223 kg).  LABORATORY DATA: Lab Results  Component Value Date   WBC 4.3 04/29/2014   HGB 12.6* 04/29/2014   HCT 37.9* 04/29/2014   MCV 100.6* 04/29/2014   PLT 206 04/29/2014      Chemistry      Component Value Date/Time   NA 139 04/29/2014 1304   NA 140 12/19/2013 0405   K 4.4 04/29/2014 1304   K 4.2 12/19/2013 0405   CL 104 12/19/2013 0405   CO2 26 04/29/2014 1304   CO2 26 12/19/2013 0405   BUN 18.1 04/29/2014 1304   BUN 14 12/19/2013 0405   CREATININE 0.9 04/29/2014 1304   CREATININE 0.87 12/19/2013 0405      Component Value Date/Time   CALCIUM 8.8 04/29/2014 1304   CALCIUM 9.0 12/19/2013 0405   ALKPHOS 103 04/29/2014 1304   ALKPHOS 126* 12/18/2013 1610   AST 19 04/29/2014 1304   AST 17 12/18/2013 1610   ALT 21 04/29/2014 1304   ALT 16 12/18/2013 1610   BILITOT 0.41 04/29/2014 1304   BILITOT 0.4 12/18/2013 1610       RADIOGRAPHIC STUDIES: Dg Chest 2 View  04/10/2014   CLINICAL DATA:  History of right upper lobe carcinoma with lobectomy in March of 2015 followed by radiation and chemotherapy. Followup  EXAM: CHEST  2 VIEW  COMPARISON:  CT chest of 02/28/2014 and chest x-ray of 02/13/2014  FINDINGS: Postoperative changes on the right are stable with volume loss and apical as well as basilar scarring. The left lung is clear. No metastatic involvement of the chest is seen by chest x-ray. The heart is within normal limits in size. A left-sided Port-A-Cath is unchanged with the tip seen to the expected SVC -RA junction. There are degenerative  changes throughout the lumbar spine.  IMPRESSION: Stable postoperative changes in the right hemi thorax. No active lung disease.   Electronically Signed   By: Ivar Drape M.D.   On: 04/10/2014 11:47   Ct Chest W Contrast  04/25/2014   CLINICAL DATA:  Lung cancer.  Restaging.  EXAM: CT CHEST, ABDOMEN, AND PELVIS WITH CONTRAST  TECHNIQUE: Multidetector CT imaging of the chest, abdomen and pelvis was performed following the standard protocol during bolus administration of intravenous contrast.  CONTRAST:  161mL OMNIPAQUE IOHEXOL 300 MG/ML  SOLN  COMPARISON:  12/27/2013  FINDINGS: CT CHEST FINDINGS  Mediastinum: The heart size appears normal. No pericardial effusion identified. The trachea is patent. Normal appearance of the esophagus.  There is calcified atherosclerotic disease involving the thoracic aorta as well as the LAD and RCA Coronary artery. High left paratracheal lymph node measures 1.2 cm, image 9/series 2. This is compared with 1 cm previously,. Right paratracheal lymph node is stable measuring 9 mm. The sub- carinal lymph node is improved, now measuring 6 mm, image 25/ series 2. Previously 1.1 cm.  Lungs/Pleura: No hilar adenopathy. Increase in volume of the right pleural effusion. Radiation change within the paramediastinal right lung is again noted. Chronic postsurgical volume loss from the right lung is identified. Mild to moderate changes of centrilobular and paraseptal emphysema identified.  Musculoskeletal: The left posterior chest wall mass involving the left sixth rib is significantly improved from previous exam. There is increased mineralization associated with the left rib compatible with healing. Right thoracoplasty has been performed, stable from previous exam.  CT ABDOMEN AND PELVIS FINDINGS  Hepatobiliary: No focal liver abnormality. Stones identified within the gallbladder. No biliary dilatation.  Pancreas: Appears normal.  Spleen: Appears normal.  Adrenals/Urinary Tract: The adrenal glands  are both normal. Normal appearance of both kidneys. The urinary bladder is unremarkable.  Stomach/Bowel: The stomach is normal. The small bowel loops have a normal course and caliber. There is no bowel obstruction. The appendix is visualized and appears normal. Normal appearance of the colon.  Vascular/Lymphatic: Calcified atherosclerotic disease involves the abdominal aorta. There is no aneurysm. No retroperitoneal or mesenteric adenopathy identified. No enlarged pelvic or inguinal lymph nodes.  Reproductive: Prostate gland enlargement noted.  Other: There is no ascites or focal fluid collections within the abdomen or pelvis. No peritoneal nodule or mass noted.  Musculoskeletal: No aggressive lytic or sclerotic bone lesions identified.  IMPRESSION: 1. The large left chest wall mass that is nearly completely resolved with improved mineralization of the previously lytic involvement of the posterior left 6 rib. 2. The right paratracheal and sub- carinal lymph nodes have decreased in size from previous exam. High left paratracheal lymph node is slightly increased in the interval. 3. Atherosclerotic disease including multi vessel coronary artery calcification 4. Emphysema.   Electronically Signed   By: Kerby Moors M.D.   On: 04/25/2014 11:31   Ct Abdomen Pelvis W Contrast  04/25/2014   CLINICAL DATA:  Lung cancer.  Restaging.  EXAM: CT CHEST, ABDOMEN, AND PELVIS WITH CONTRAST  TECHNIQUE: Multidetector CT imaging of the chest, abdomen and pelvis was performed following the standard protocol during bolus administration of intravenous contrast.  CONTRAST:  162mL OMNIPAQUE IOHEXOL 300 MG/ML  SOLN  COMPARISON:  12/27/2013  FINDINGS: CT CHEST FINDINGS  Mediastinum: The heart size appears normal. No pericardial effusion identified. The trachea is patent. Normal appearance of the esophagus. There is calcified atherosclerotic disease involving the thoracic aorta as well as the LAD and RCA Coronary artery. High left  paratracheal lymph node measures 1.2 cm, image 9/series 2. This is compared with 1 cm previously,. Right paratracheal lymph node is stable measuring 9 mm. The sub- carinal lymph node is improved, now measuring 6 mm, image 25/ series 2. Previously 1.1 cm.  Lungs/Pleura: No hilar adenopathy. Increase in volume of the right pleural effusion. Radiation change within the paramediastinal right lung is again noted. Chronic postsurgical volume loss from the right lung is identified. Mild to moderate changes of centrilobular and paraseptal emphysema identified.  Musculoskeletal: The left posterior chest wall mass involving the left sixth rib is significantly improved from previous exam. There is increased mineralization associated with the left rib compatible with healing. Right  thoracoplasty has been performed, stable from previous exam.  CT ABDOMEN AND PELVIS FINDINGS  Hepatobiliary: No focal liver abnormality. Stones identified within the gallbladder. No biliary dilatation.  Pancreas: Appears normal.  Spleen: Appears normal.  Adrenals/Urinary Tract: The adrenal glands are both normal. Normal appearance of both kidneys. The urinary bladder is unremarkable.  Stomach/Bowel: The stomach is normal. The small bowel loops have a normal course and caliber. There is no bowel obstruction. The appendix is visualized and appears normal. Normal appearance of the colon.  Vascular/Lymphatic: Calcified atherosclerotic disease involves the abdominal aorta. There is no aneurysm. No retroperitoneal or mesenteric adenopathy identified. No enlarged pelvic or inguinal lymph nodes.  Reproductive: Prostate gland enlargement noted.  Other: There is no ascites or focal fluid collections within the abdomen or pelvis. No peritoneal nodule or mass noted.  Musculoskeletal: No aggressive lytic or sclerotic bone lesions identified.  IMPRESSION: 1. The large left chest wall mass that is nearly completely resolved with improved mineralization of the  previously lytic involvement of the posterior left 6 rib. 2. The right paratracheal and sub- carinal lymph nodes have decreased in size from previous exam. High left paratracheal lymph node is slightly increased in the interval. 3. Atherosclerotic disease including multi vessel coronary artery calcification 4. Emphysema.   Electronically Signed   By: Kerby Moors M.D.   On: 04/25/2014 11:31    ASSESSMENT/PLAN:  This is a very pleasant 74 years old white male recently diagnosed with a stage IIIA non-small cell lung cancer. He completed a course of concurrent chemoradiation with weekly carboplatin and paclitaxel status post 5 cycle, this was followed by right upper lobectomy with lymph node dissection under the care of Dr. Servando Snare. The final pathology showed positive bronchial resection margin with a pathologic stage ypT3, ypN0. The patient underwent a course of radiotherapy to the positive resection margin but unfortunately the recent CT scan of the chest showed evidence for disease progression with new lytic rib lesion as well as enlarged mediastinal lymphadenopathy. The MRI of the brain was negative for brain metastasis. He is status post 3 cycles of systemic chemotherapy with carboplatin for AUC of 5 on day 1 and Abraxane 100 mg/M2 on days 1, 8 and 15 every 3 weeks. However his restaging CT scan showed evidence for disease progression. He is now being treated with immunotherapy with Nivolumab. He is status post 8 cycles. The most recent restaging CT scan shows an nearly completely resolve large left chest wall mass with improved mineralization of the previously lytic lesion and involvement of the posterior left sixth rib. The right paratracheal and subcarinal lymph nodes have decreased in size from previous exam in the high left paratracheal lymph node is slightly increased in the interval.  There is no evidence for metastatic disease in the abdomen or the pelvis.Patient was discussed with and also seen by  Dr. Julien Nordmann. The CT scan results were discussed with the patient. Overall he's had significant improvement in his disease and we will continue with cycle #9 today as scheduled. He will follow-up in 2 weeks prior to the start of cycle #10.       Carlton Adam, PA-C 04/29/2014  ADDENDUM: Hematology/Oncology Attending: I had a face to face encounter with the patient. I recommended his care plan. This is a very pleasant 74 years old white male with metastatic non-small cell lung cancer squamous cell carcinoma who is currently undergoing immunotherapy with Nivolumab status post 8 cycles. The patient has significant improvement in his disease  was almost complete resolution of the large left chest wall mass. I discussed the scan results and showed the images to the patient today. I recommended for him to continue his current treatment with immunotherapy as a scheduled. He will receive cycle #9 today. The patient would come back for follow-up visit in 2 weeks with the next cycle of his treatment. For pain management the patient will continue on his current pain medication with Percocet. He was advised to call immediately if he has any concerning symptoms in the interval.  Disclaimer: This note was dictated with voice recognition software. Similar sounding words can inadvertently be transcribed and may be missed upon review. [name]., MD 04/29/2014

## 2014-04-30 ENCOUNTER — Telehealth: Payer: Self-pay | Admitting: *Deleted

## 2014-04-30 ENCOUNTER — Telehealth: Payer: Self-pay | Admitting: Internal Medicine

## 2014-04-30 NOTE — Telephone Encounter (Signed)
Per staff message and POF I have scheduled appts. Advised scheduler of appts. JMW  

## 2014-04-30 NOTE — Telephone Encounter (Signed)
Patient confirm appointment for February. Mailed calendar

## 2014-05-13 ENCOUNTER — Other Ambulatory Visit (HOSPITAL_BASED_OUTPATIENT_CLINIC_OR_DEPARTMENT_OTHER): Payer: Medicare Other

## 2014-05-13 ENCOUNTER — Encounter: Payer: Self-pay | Admitting: Physician Assistant

## 2014-05-13 ENCOUNTER — Telehealth: Payer: Self-pay | Admitting: Internal Medicine

## 2014-05-13 ENCOUNTER — Ambulatory Visit (HOSPITAL_BASED_OUTPATIENT_CLINIC_OR_DEPARTMENT_OTHER): Payer: Medicare Other

## 2014-05-13 ENCOUNTER — Ambulatory Visit (HOSPITAL_BASED_OUTPATIENT_CLINIC_OR_DEPARTMENT_OTHER): Payer: Medicare Other | Admitting: Physician Assistant

## 2014-05-13 ENCOUNTER — Ambulatory Visit: Payer: Medicare Other

## 2014-05-13 VITALS — BP 120/65 | HR 81 | Temp 98.4°F | Wt 199.5 lb

## 2014-05-13 DIAGNOSIS — Z79899 Other long term (current) drug therapy: Secondary | ICD-10-CM

## 2014-05-13 DIAGNOSIS — C3492 Malignant neoplasm of unspecified part of left bronchus or lung: Secondary | ICD-10-CM

## 2014-05-13 DIAGNOSIS — C3411 Malignant neoplasm of upper lobe, right bronchus or lung: Secondary | ICD-10-CM

## 2014-05-13 DIAGNOSIS — Z5112 Encounter for antineoplastic immunotherapy: Secondary | ICD-10-CM

## 2014-05-13 DIAGNOSIS — Z95828 Presence of other vascular implants and grafts: Secondary | ICD-10-CM

## 2014-05-13 LAB — COMPREHENSIVE METABOLIC PANEL (CC13)
ALT: 18 U/L (ref 0–55)
AST: 20 U/L (ref 5–34)
Albumin: 3.3 g/dL — ABNORMAL LOW (ref 3.5–5.0)
Alkaline Phosphatase: 92 U/L (ref 40–150)
Anion Gap: 11 mEq/L (ref 3–11)
BILIRUBIN TOTAL: 0.36 mg/dL (ref 0.20–1.20)
BUN: 16.6 mg/dL (ref 7.0–26.0)
CO2: 24 meq/L (ref 22–29)
Calcium: 9.1 mg/dL (ref 8.4–10.4)
Chloride: 106 mEq/L (ref 98–109)
Creatinine: 0.8 mg/dL (ref 0.7–1.3)
EGFR: 88 mL/min/{1.73_m2} — ABNORMAL LOW (ref >90)
GLUCOSE: 103 mg/dL (ref 70–140)
Potassium: 4.1 mEq/L (ref 3.5–5.1)
Sodium: 141 mEq/L (ref 136–145)
Total Protein: 6.4 g/dL (ref 6.4–8.3)

## 2014-05-13 LAB — CBC WITH DIFFERENTIAL/PLATELET
BASO%: 0.2 % (ref 0.0–2.0)
Basophils Absolute: 0 10*3/uL (ref 0.0–0.1)
EOS ABS: 0.1 10*3/uL (ref 0.0–0.5)
EOS%: 2.1 % (ref 0.0–7.0)
HCT: 37.1 % — ABNORMAL LOW (ref 38.4–49.9)
HEMOGLOBIN: 12 g/dL — AB (ref 13.0–17.1)
LYMPH%: 19.5 % (ref 14.0–49.0)
MCH: 32.9 pg (ref 27.2–33.4)
MCHC: 32.3 g/dL (ref 32.0–36.0)
MCV: 101.6 fL — AB (ref 79.3–98.0)
MONO#: 0.5 10*3/uL (ref 0.1–0.9)
MONO%: 11.2 % (ref 0.0–14.0)
NEUT#: 3.2 10*3/uL (ref 1.5–6.5)
NEUT%: 67 % (ref 39.0–75.0)
Platelets: 179 10*3/uL (ref 140–400)
RBC: 3.65 10*6/uL — AB (ref 4.20–5.82)
RDW: 13.7 % (ref 11.0–14.6)
WBC: 4.8 10*3/uL (ref 4.0–10.3)
lymph#: 0.9 10*3/uL (ref 0.9–3.3)

## 2014-05-13 LAB — TSH CHCC: TSH: 1.601 m(IU)/L (ref 0.320–4.118)

## 2014-05-13 MED ORDER — HEPARIN SOD (PORK) LOCK FLUSH 100 UNIT/ML IV SOLN
500.0000 [IU] | Freq: Once | INTRAVENOUS | Status: AC | PRN
  Administered 2014-05-13: 500 [IU]
  Filled 2014-05-13: qty 5

## 2014-05-13 MED ORDER — SODIUM CHLORIDE 0.9 % IV SOLN
Freq: Once | INTRAVENOUS | Status: AC
  Administered 2014-05-13: 15:00:00 via INTRAVENOUS

## 2014-05-13 MED ORDER — NIVOLUMAB CHEMO INJECTION 100 MG/10ML
3.1000 mg/kg | Freq: Once | INTRAVENOUS | Status: AC
  Administered 2014-05-13: 260 mg via INTRAVENOUS
  Filled 2014-05-13: qty 26

## 2014-05-13 MED ORDER — SODIUM CHLORIDE 0.9 % IJ SOLN
10.0000 mL | INTRAMUSCULAR | Status: DC | PRN
  Administered 2014-05-13: 10 mL
  Filled 2014-05-13: qty 10

## 2014-05-13 MED ORDER — HEPARIN SOD (PORK) LOCK FLUSH 100 UNIT/ML IV SOLN
500.0000 [IU] | Freq: Once | INTRAVENOUS | Status: DC
  Filled 2014-05-13: qty 5

## 2014-05-13 MED ORDER — SODIUM CHLORIDE 0.9 % IJ SOLN
10.0000 mL | INTRAMUSCULAR | Status: DC | PRN
  Administered 2014-05-13: 10 mL via INTRAVENOUS
  Filled 2014-05-13: qty 10

## 2014-05-13 NOTE — Progress Notes (Addendum)
Terry Robles  [phone] [phone]  SHARED VISIT PROGRESS NOTE  Terry Font, MD 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   Primary site: Lung (Right)   Staging method: AJCC 7th Edition   Clinical: Stage IIIA (T3, N2, M0) signed by Curt Bears, MD on 05/09/2013  2:56 PM   Summary: Stage IIIA (T3, N2, M0)  PRIOR THERAPY:  1) Concurrent chemoradiation with weekly carboplatin for an AUC of 2 and paclitaxel 45 mg per meter squared. Status post 5 cycles. 2) Bronchoscopy, mediastinoscopy, right posterior lateral thoracotomy with right upper lobectomy, lymph node dissection,  and posterior approach to en bloc chest wall resection, ribs 2, 3, and 4, with positive bronchial resection margin. 3) palliative radiotherapy to the best of resection margin under the care of Dr. Tammi Klippel completed on 10/14/2013. 4) systemic chemotherapy with carboplatin for an AUC of 5 given on day 1 and Abraxane 100 mg/m given days 1, 8 and 15 every 3 weeks status post 3 cycles discontinued 12/31/2013 secondary to disease progression  CURRENT THERAPY: Immunotherapy with Nivolumab 3 mg/kg given every 2 weeks.  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    INTERVAL HISTORY: Terry Robles 74 y.o. male returns for followup visit. Overall is tolerating his treatment with immunotherapy with  Nivolumab without significant difficulty. He denies any change in his baseline shortness of breath, no diarrhea or skin rash. He continues to have some residual numbness/tingling in his toes as well as his right hand from previous chemotherapy. This remains stable.  He has a cough which is been long-standing, primarily dry although occasionally productive of clear secretions. This is well managed with Delsym cough syrup during the day and Hycodan cough syrup at night. He continues to complain of pain in both hips and his right shoulder since the start of treatment. His been taking Percocet to 3 times a day with reasonable relief. He voiced no other specific complaints today.  He has no hemoptysis. He denied having any significant weight loss or night sweats. He has no nausea or vomiting.  He has some bruising on his right wrist that he feels may have coming from his "tinkering about". He is here to proceed with cycle #10 of his immunotherapy with Nivolumab.    MEDICAL HISTORY: Past Medical History  Diagnosis Date  . Hypercholesteremia 05/14/2011  . BPH (benign prostatic hyperplasia) 05/14/2011    takes Uroxatral daily  . GERD (gastroesophageal reflux disease)   . Shortness of breath     weather related  . Pneumonia 1965  . Headache(784.0)     occasionally  . Joint pain   . Maintenance chemotherapy   . Urinary frequency   .  History of shingles   . Anemia   . Acquired thrombocytopenia   . 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 Cancer     squamous cell carcinoma right superior sulcus  . Lung cancer     right lung    ALLERGIES:  has No Known Allergies.  MEDICATIONS:  Current Outpatient Prescriptions  Medication Sig Dispense Refill   . alfuzosin (UROXATRAL) 10 MG 24 hr tablet Take 10 mg by mouth daily with breakfast.    . cycloSPORINE (RESTASIS) 0.05 % ophthalmic emulsion 1 drop 2 (two) times daily.    Marland Kitchen docusate sodium (COLACE) 100 MG capsule Take 100 mg by mouth daily.    . feeding supplement, RESOURCE BREEZE, (RESOURCE BREEZE) LIQD Take 1 Container by mouth daily at 3 pm.  0  . HYDROcodone-homatropine (HYCODAN) 5-1.5 MG/5ML syrup Take 5 mLs by mouth every 6 (six) hours as needed for cough. 240 mL 0  . Multiple Vitamin (MULITIVITAMIN WITH MINERALS) TABS Take 1 tablet by mouth daily.    Marland Kitchen oxyCODONE-acetaminophen (PERCOCET/ROXICET) 5-325 MG per tablet Take 1 tablet by mouth every 6 (six) hours as needed for severe pain. 90 tablet 0  . polyethylene glycol (MIRALAX / GLYCOLAX) packet Take 17 g by mouth daily.    . prochlorperazine (COMPAZINE) 5 MG tablet Take 5 mg by mouth every 6 (six) hours as needed for nausea or vomiting.     No current facility-administered medications for this visit.   Facility-Administered Medications Ordered in Other Visits  Medication Dose Route Frequency Provider Last Rate Last Dose  . sodium chloride 0.9 % injection 10 mL  10 mL Intracatheter PRN Curt Bears, MD   10 mL at 05/13/14 1640    SURGICAL HISTORY:  Past Surgical History  Procedure Laterality Date  . Other surgical history      benign tumor removed from left leg several years ago  . Other surgical history      had melanoma/squamous cell removed previously per patient  . Tumor excision      Left knee  . Colonoscopy w/ biopsies and polypectomy    . Video bronchoscopy N/A 07/30/2013    Procedure: VIDEO BRONCHOSCOPY;  Surgeon: Grace Isaac, MD;  Location: [facility];  Service: Thoracic;  Laterality: N/A;  . Mediastinoscopy N/A 07/30/2013    Procedure: MEDIASTINOSCOPY;  Surgeon: Grace Isaac, MD;  Location: [facility];  Service: Thoracic;  Laterality: N/A;  . Video assisted thoracoscopy (vats)/wedge resection Right 07/30/2013     Procedure: VIDEO ASSISTED THORACOSCOPY;  Surgeon: Grace Isaac, MD;  Location: [facility];  Service: Thoracic;  Laterality: Right;  . Thoracotomy  07/30/2013    Procedure: THORACOTOMY FOR CHEST WALL RESECTION, RIGHT UPPER LOBECTOMY;  Surgeon: Grace Isaac, MD;  Location: [facility];  Service: Thoracic;;  . Adenoidectomy  as a child  . Tonsillectomy    . Portacath placement Left 10/30/2013    Procedure: INSERTION PORT-A-CATH WITH ULTRASOUND GUIDANCE AND FLUORO;  Surgeon: Grace Isaac, MD;  Location: [facility];  Service: Thoracic;  Laterality: Left;    REVIEW OF SYSTEMS:  Constitutional: positive for fatigue Eyes: negative Ears, nose, mouth, throat, and face: negative Respiratory: positive for cough and dyspnea on exertion Cardiovascular: negative Gastrointestinal: negative Genitourinary:negative Integument/breast: negative Hematologic/lymphatic: negative Musculoskeletal:positive for arthralgias Neurological: positive for paresthesia Behavioral/Psych: negative Endocrine: negative Allergic/Immunologic: negative   PHYSICAL EXAMINATION: General appearance: alert, cooperative, appears stated age and no distress Head: Normocephalic, without obvious abnormality, atraumatic Neck: no  adenopathy, no carotid bruit, no JVD, supple, symmetrical, trachea midline and thyroid not enlarged, symmetric, no tenderness/mass/nodules Lymph nodes: Cervical, supraclavicular, and axillary nodes normal. Resp: clear to auscultation bilaterally Back: symmetric, no curvature. ROM normal. No CVA tenderness. Cardio: regular rate and rhythm, S1, S2 normal, no murmur, click, rub or gallop GI: soft, non-tender; bowel sounds normal; no masses,  no organomegaly Extremities: extremities normal, atraumatic, no cyanosis or edema and The left forearm and hand are cooler to touch than the right Neurologic: Alert and oriented X 3, normal strength and tone. Normal symmetric reflexes. Normal coordination and gait Skin: Left  anterior chest Port-A-Cath incisions are well healed, no evidence of infection. Resolving area of ecchymosis right proximal forearm region. No evidence of bleeding, vesicles or other skin eruptions  ECOG PERFORMANCE STATUS: 1 - Symptomatic but completely ambulatory  Blood pressure , SpO2 98 %.  LABORATORY DATA: Lab Results  Component Value Date   WBC 4.8 05/13/2014   HGB 12.0* 05/13/2014   HCT 37.1* 05/13/2014   MCV 101.6* 05/13/2014   PLT 179 05/13/2014      Chemistry      Component Value Date/Time   NA 141 05/13/2014 1324   NA 140 12/19/2013 0405   K 4.1 05/13/2014 1324   K 4.2 12/19/2013 0405   CL 104 12/19/2013 0405   CO2 24 05/13/2014 1324   CO2 26 12/19/2013 0405   BUN 16.6 05/13/2014 1324   BUN 14 12/19/2013 0405   CREATININE 0.8 05/13/2014 1324   CREATININE 0.87 12/19/2013 0405      Component Value Date/Time   CALCIUM 9.1 05/13/2014 1324   CALCIUM 9.0 12/19/2013 0405   ALKPHOS 92 05/13/2014 1324   ALKPHOS 126* 12/18/2013 1610   AST 20 05/13/2014 1324   AST 17 12/18/2013 1610   ALT 18 05/13/2014 1324   ALT 16 12/18/2013 1610   BILITOT 0.36 05/13/2014 1324   BILITOT 0.4 12/18/2013 1610       RADIOGRAPHIC STUDIES: Ct Chest W Contrast  04/25/2014   CLINICAL DATA:  Lung cancer.  Restaging.  EXAM: CT CHEST, ABDOMEN, AND PELVIS WITH CONTRAST  TECHNIQUE: Multidetector CT imaging of the chest, abdomen and pelvis was performed following the standard protocol during bolus administration of intravenous contrast.  CONTRAST:  142mL OMNIPAQUE IOHEXOL 300 MG/ML  SOLN  COMPARISON:  12/27/2013  FINDINGS: CT CHEST FINDINGS  Mediastinum: The heart size appears normal. No pericardial effusion identified. The trachea is patent. Normal appearance of the esophagus. There is calcified atherosclerotic disease involving the thoracic aorta as well as the LAD and RCA Coronary artery. High left paratracheal lymph node measures 1.2 cm, image 9/series 2. This is compared with 1 cm  previously,. Right paratracheal lymph node is stable measuring 9 mm. The sub- carinal lymph node is improved, now measuring 6 mm, image 25/ series 2. Previously 1.1 cm.  Lungs/Pleura: No hilar adenopathy. Increase in volume of the right pleural effusion. Radiation change within the paramediastinal right lung is again noted. Chronic postsurgical volume loss from the right lung is identified. Mild to moderate changes of centrilobular and paraseptal emphysema identified.  Musculoskeletal: The left posterior chest wall mass involving the left sixth rib is significantly improved from previous exam. There is increased mineralization associated with the left rib compatible with healing. Right thoracoplasty has been performed, stable from previous exam.  CT ABDOMEN AND PELVIS FINDINGS  Hepatobiliary: No focal liver abnormality. Stones identified within the gallbladder. No biliary dilatation.  Pancreas: Appears normal.  Spleen: Appears  normal.  Adrenals/Urinary Tract: The adrenal glands are both normal. Normal appearance of both kidneys. The urinary bladder is unremarkable.  Stomach/Bowel: The stomach is normal. The small bowel loops have a normal course and caliber. There is no bowel obstruction. The appendix is visualized and appears normal. Normal appearance of the colon.  Vascular/Lymphatic: Calcified atherosclerotic disease involves the abdominal aorta. There is no aneurysm. No retroperitoneal or mesenteric adenopathy identified. No enlarged pelvic or inguinal lymph nodes.  Reproductive: Prostate gland enlargement noted.  Other: There is no ascites or focal fluid collections within the abdomen or pelvis. No peritoneal nodule or mass noted.  Musculoskeletal: No aggressive lytic or sclerotic bone lesions identified.  IMPRESSION: 1. The large left chest wall mass that is nearly completely resolved with improved mineralization of the previously lytic involvement of the posterior left 6 rib. 2. The right paratracheal and  sub- carinal lymph nodes have decreased in size from previous exam. High left paratracheal lymph node is slightly increased in the interval. 3. Atherosclerotic disease including multi vessel coronary artery calcification 4. Emphysema.   Electronically Signed   By: Kerby Moors M.D.   On: 04/25/2014 11:31   Ct Abdomen Pelvis W Contrast  04/25/2014   CLINICAL DATA:  Lung cancer.  Restaging.  EXAM: CT CHEST, ABDOMEN, AND PELVIS WITH CONTRAST  TECHNIQUE: Multidetector CT imaging of the chest, abdomen and pelvis was performed following the standard protocol during bolus administration of intravenous contrast.  CONTRAST:  16mL OMNIPAQUE IOHEXOL 300 MG/ML  SOLN  COMPARISON:  12/27/2013  FINDINGS: CT CHEST FINDINGS  Mediastinum: The heart size appears normal. No pericardial effusion identified. The trachea is patent. Normal appearance of the esophagus. There is calcified atherosclerotic disease involving the thoracic aorta as well as the LAD and RCA Coronary artery. High left paratracheal lymph node measures 1.2 cm, image 9/series 2. This is compared with 1 cm previously,. Right paratracheal lymph node is stable measuring 9 mm. The sub- carinal lymph node is improved, now measuring 6 mm, image 25/ series 2. Previously 1.1 cm.  Lungs/Pleura: No hilar adenopathy. Increase in volume of the right pleural effusion. Radiation change within the paramediastinal right lung is again noted. Chronic postsurgical volume loss from the right lung is identified. Mild to moderate changes of centrilobular and paraseptal emphysema identified.  Musculoskeletal: The left posterior chest wall mass involving the left sixth rib is significantly improved from previous exam. There is increased mineralization associated with the left rib compatible with healing. Right thoracoplasty has been performed, stable from previous exam.  CT ABDOMEN AND PELVIS FINDINGS  Hepatobiliary: No focal liver abnormality. Stones identified within the gallbladder.  No biliary dilatation.  Pancreas: Appears normal.  Spleen: Appears normal.  Adrenals/Urinary Tract: The adrenal glands are both normal. Normal appearance of both kidneys. The urinary bladder is unremarkable.  Stomach/Bowel: The stomach is normal. The small bowel loops have a normal course and caliber. There is no bowel obstruction. The appendix is visualized and appears normal. Normal appearance of the colon.  Vascular/Lymphatic: Calcified atherosclerotic disease involves the abdominal aorta. There is no aneurysm. No retroperitoneal or mesenteric adenopathy identified. No enlarged pelvic or inguinal lymph nodes.  Reproductive: Prostate gland enlargement noted.  Other: There is no ascites or focal fluid collections within the abdomen or pelvis. No peritoneal nodule or mass noted.  Musculoskeletal: No aggressive lytic or sclerotic bone lesions identified.  IMPRESSION: 1. The large left chest wall mass that is nearly completely resolved with improved mineralization of the previously lytic involvement  of the posterior left 6 rib. 2. The right paratracheal and sub- carinal lymph nodes have decreased in size from previous exam. High left paratracheal lymph node is slightly increased in the interval. 3. Atherosclerotic disease including multi vessel coronary artery calcification 4. Emphysema.   Electronically Signed   By: Kerby Moors M.D.   On: 04/25/2014 11:31    ASSESSMENT/PLAN:  This is a very pleasant 74 years old white male recently diagnosed with a stage IIIA non-small cell lung cancer. He completed a course of concurrent chemoradiation with weekly carboplatin and paclitaxel status post 5 cycle, this was followed by right upper lobectomy with lymph node dissection under the care of Dr. Servando Snare. The final pathology showed positive bronchial resection margin with a pathologic stage ypT3, ypN0. The patient underwent a course of radiotherapy to the positive resection margin but unfortunately the recent CT scan of  the chest showed evidence for disease progression with new lytic rib lesion as well as enlarged mediastinal lymphadenopathy. The MRI of the brain was negative for brain metastasis. He is status post 3 cycles of systemic chemotherapy with carboplatin for AUC of 5 on day 1 and Abraxane 100 mg/M2 on days 1, 8 and 15 every 3 weeks. However his restaging CT scan showed evidence for disease progression. He is now being treated with immunotherapy with Nivolumab. He is status post 9 cycles. The most recent restaging CT scan shows an nearly completely resolve large left chest wall mass with improved mineralization of the previously lytic lesion and involvement of the posterior left sixth rib. The right paratracheal and subcarinal lymph nodes have decreased in size from previous exam in the high left paratracheal lymph node is slightly increased in the interval.  There is no evidence for metastatic disease in the abdomen or the pelvis.Patient was discussed with and also seen by Dr. Julien Nordmann.  Overall he's had significant improvement in his disease and we will continue with cycle #10 today as scheduled. He will follow-up in 2 weeks prior to the start of cycle #11.     Carlton Adam, PA-C 05/13/2014  ADDENDUM: Hematology/Oncology Attending: I had a face to face encounter with the patient. I recommended his care plan. This is a very pleasant 74 years old white male with metastatic non-small cell lung cancer, squamous cell carcinoma who is currently undergoing immunotherapy with Nivolumab status post 9 cycles and tolerating his treatment fairly well. The patient is feeling fine today with no specific complaints. I recommended for him to proceed with cycle #10 today as a scheduled. He will come back for follow-up visit in 2 weeks with the next cycle of his treatment. He was advised to call immediately if he has any concerning symptoms in the interval.  Disclaimer: This note was dictated with voice recognition  software. Similar sounding words can inadvertently be transcribed and may be missed upon review. [name]., MD 05/19/2014

## 2014-05-13 NOTE — Patient Instructions (Signed)

## 2014-05-13 NOTE — Telephone Encounter (Signed)
pt line busy....pt will get updated sched today in chemo

## 2014-05-13 NOTE — Patient Instructions (Signed)
[facility] Discharge Instructions for Patients Receiving Chemotherapy  Today you received the following chemotherapy agents Nivolumab.  To help prevent nausea and vomiting after your treatment, we encourage you to take your nausea medication as prescribed.   If you develop nausea and vomiting that is not controlled by your nausea medication, call the clinic.   BELOW ARE SYMPTOMS THAT SHOULD BE REPORTED IMMEDIATELY:  *FEVER GREATER THAN 100.5 F  *CHILLS WITH OR WITHOUT FEVER  NAUSEA AND VOMITING THAT IS NOT CONTROLLED WITH YOUR NAUSEA MEDICATION  *UNUSUAL SHORTNESS OF BREATH  *UNUSUAL BRUISING OR BLEEDING  TENDERNESS IN MOUTH AND THROAT WITH OR WITHOUT PRESENCE OF ULCERS  *URINARY PROBLEMS  *BOWEL PROBLEMS  UNUSUAL RASH Items with * indicate a potential emergency and should be followed up as soon as possible.  Feel free to call the clinic you have any questions or concerns. The clinic phone number is [phone].

## 2014-05-19 NOTE — Patient Instructions (Signed)
Follow-up in 2 weeks

## 2014-05-20 ENCOUNTER — Encounter: Payer: Self-pay | Admitting: Physician Assistant

## 2014-05-27 ENCOUNTER — Ambulatory Visit (HOSPITAL_BASED_OUTPATIENT_CLINIC_OR_DEPARTMENT_OTHER): Payer: Medicare Other

## 2014-05-27 ENCOUNTER — Encounter: Payer: Self-pay | Admitting: Physician Assistant

## 2014-05-27 ENCOUNTER — Other Ambulatory Visit (HOSPITAL_BASED_OUTPATIENT_CLINIC_OR_DEPARTMENT_OTHER): Payer: Medicare Other

## 2014-05-27 ENCOUNTER — Ambulatory Visit: Payer: TRICARE For Life (TFL)

## 2014-05-27 ENCOUNTER — Telehealth: Payer: Self-pay | Admitting: Internal Medicine

## 2014-05-27 ENCOUNTER — Ambulatory Visit (HOSPITAL_BASED_OUTPATIENT_CLINIC_OR_DEPARTMENT_OTHER): Payer: Medicare Other | Admitting: Physician Assistant

## 2014-05-27 VITALS — BP 117/75 | HR 75 | Temp 98.5°F | Resp 18 | Ht 74.0 in | Wt 201.8 lb

## 2014-05-27 DIAGNOSIS — C7951 Secondary malignant neoplasm of bone: Secondary | ICD-10-CM

## 2014-05-27 DIAGNOSIS — C3411 Malignant neoplasm of upper lobe, right bronchus or lung: Secondary | ICD-10-CM

## 2014-05-27 DIAGNOSIS — Z5112 Encounter for antineoplastic immunotherapy: Secondary | ICD-10-CM

## 2014-05-27 DIAGNOSIS — Z95828 Presence of other vascular implants and grafts: Secondary | ICD-10-CM

## 2014-05-27 DIAGNOSIS — C3492 Malignant neoplasm of unspecified part of left bronchus or lung: Secondary | ICD-10-CM

## 2014-05-27 LAB — CBC WITH DIFFERENTIAL/PLATELET
BASO%: 0.5 % (ref 0.0–2.0)
Basophils Absolute: 0 10*3/uL (ref 0.0–0.1)
EOS ABS: 0.1 10*3/uL (ref 0.0–0.5)
EOS%: 2.6 % (ref 0.0–7.0)
HCT: 36.3 % — ABNORMAL LOW (ref 38.4–49.9)
HGB: 11.8 g/dL — ABNORMAL LOW (ref 13.0–17.1)
LYMPH%: 17.8 % (ref 14.0–49.0)
MCH: 32.4 pg (ref 27.2–33.4)
MCHC: 32.5 g/dL (ref 32.0–36.0)
MCV: 99.6 fL — AB (ref 79.3–98.0)
MONO#: 0.5 10*3/uL (ref 0.1–0.9)
MONO%: 12.2 % (ref 0.0–14.0)
NEUT%: 66.9 % (ref 39.0–75.0)
NEUTROS ABS: 2.9 10*3/uL (ref 1.5–6.5)
PLATELETS: 219 10*3/uL (ref 140–400)
RBC: 3.64 10*6/uL — AB (ref 4.20–5.82)
RDW: 13.8 % (ref 11.0–14.6)
WBC: 4.3 10*3/uL (ref 4.0–10.3)
lymph#: 0.8 10*3/uL — ABNORMAL LOW (ref 0.9–3.3)

## 2014-05-27 LAB — COMPREHENSIVE METABOLIC PANEL (CC13)
ALBUMIN: 3.4 g/dL — AB (ref 3.5–5.0)
ALT: 15 U/L (ref 0–55)
AST: 21 U/L (ref 5–34)
Alkaline Phosphatase: 88 U/L (ref 40–150)
Anion Gap: 11 mEq/L (ref 3–11)
BUN: 17.1 mg/dL (ref 7.0–26.0)
CALCIUM: 9.1 mg/dL (ref 8.4–10.4)
CHLORIDE: 104 meq/L (ref 98–109)
CO2: 25 mEq/L (ref 22–29)
Creatinine: 0.9 mg/dL (ref 0.7–1.3)
EGFR: 85 mL/min/{1.73_m2} — ABNORMAL LOW (ref >90)
Glucose: 98 mg/dl (ref 70–140)
POTASSIUM: 4.3 meq/L (ref 3.5–5.1)
Sodium: 140 mEq/L (ref 136–145)
TOTAL PROTEIN: 6.5 g/dL (ref 6.4–8.3)
Total Bilirubin: 0.44 mg/dL (ref 0.20–1.20)

## 2014-05-27 MED ORDER — SODIUM CHLORIDE 0.9 % IJ SOLN
10.0000 mL | INTRAMUSCULAR | Status: DC | PRN
  Administered 2014-05-27: 10 mL
  Filled 2014-05-27: qty 10

## 2014-05-27 MED ORDER — PROCHLORPERAZINE MALEATE 5 MG PO TABS: 5.0000 mg | ORAL_TABLET | Freq: Four times a day (QID) | ORAL | Status: DC | PRN

## 2014-05-27 MED ORDER — HEPARIN SOD (PORK) LOCK FLUSH 100 UNIT/ML IV SOLN
500.0000 [IU] | Freq: Once | INTRAVENOUS | Status: AC | PRN
  Administered 2014-05-27: 500 [IU]
  Filled 2014-05-27: qty 5

## 2014-05-27 MED ORDER — SODIUM CHLORIDE 0.9 % IJ SOLN
10.0000 mL | INTRAMUSCULAR | Status: DC | PRN
  Administered 2014-05-27: 10 mL via INTRAVENOUS
  Filled 2014-05-27: qty 10

## 2014-05-27 MED ORDER — SODIUM CHLORIDE 0.9 % IV SOLN
Freq: Once | INTRAVENOUS | Status: AC
  Administered 2014-05-27: 15:00:00 via INTRAVENOUS

## 2014-05-27 MED ORDER — HYDROCODONE-HOMATROPINE 5-1.5 MG/5ML PO SYRP: 5.0000 mL | ORAL_SOLUTION | Freq: Four times a day (QID) | ORAL | Status: DC | PRN

## 2014-05-27 MED ORDER — OXYCODONE-ACETAMINOPHEN 5-325 MG PO TABS: 1.0000 | ORAL_TABLET | Freq: Four times a day (QID) | ORAL | Status: DC | PRN

## 2014-05-27 MED ORDER — SODIUM CHLORIDE 0.9 % IV SOLN
3.1000 mg/kg | Freq: Once | INTRAVENOUS | Status: AC
  Administered 2014-05-27: 260 mg via INTRAVENOUS
  Filled 2014-05-27: qty 26

## 2014-05-27 NOTE — Progress Notes (Addendum)
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   Primary site: Lung (Right)   Staging method: AJCC 7th Edition   Clinical: Stage IIIA (T3, N2, M0) signed by Terry Bears, MD on 05/09/2013  2:56 PM   Summary: Stage IIIA (T3, N2, M0)  PRIOR THERAPY:  1) Concurrent chemoradiation with weekly carboplatin for an AUC of 2 and paclitaxel 45 mg per meter squared. Status post 5 cycles. 2) Bronchoscopy, mediastinoscopy, right posterior lateral thoracotomy with right upper lobectomy, lymph node dissection,  and posterior approach to en bloc chest wall resection, ribs 2, 3, and 4, with positive bronchial resection margin. 3) palliative radiotherapy to the best of resection margin under the care of Dr. Tammi Robles completed on 10/14/2013. 4) systemic chemotherapy with carboplatin for an AUC of 5 given on day 1 and Abraxane 100 mg/m given days 1, 8 and 15 every 3 weeks status post 3 cycles discontinued 12/31/2013 secondary to disease progression  CURRENT THERAPY: Immunotherapy with Nivolumab 3 mg/kg given every 2 weeks.  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    INTERVAL HISTORY: Terry Robles 74 y.o. male returns for followup visit. Overall is tolerating his treatment with immunotherapy with  Nivolumab without significant difficulty. He denies any change in his baseline shortness of breath, no diarrhea. He presents today reporting a skin rashon his back. He states the area is pleuritic but denies any other skin eruptions elsewhere on his body. He continues to have some residual numbness/tingling in his toes as well as his right hand from previous chemotherapy. This remains stable.  He has a cough which is been long-standing, primarily dry although occasionally productive of clear secretions. This is well managed with Delsym cough syrup during the day and Hycodan cough syrup at night. He continues to complain of pain in both hips and his right shoulder since the start of treatment. His been taking Percocet to 3 times a day with reasonable relief.he requests refill prescriptions for his Hycodan cough syrup, Percocet and Compazine. He voiced no other specific complaints today.  He has no hemoptysis. He denied having any significant weight loss or night sweats. He has no nausea or vomiting. The bruising on his right wrist has resolved. He is here to proceed with cycle #11 of his immunotherapy with Nivolumab.    MEDICAL HISTORY: Past Medical History  Diagnosis Date  . Hypercholesteremia 05/14/2011  . BPH (benign prostatic hyperplasia) 05/14/2011    takes Uroxatral daily  . GERD (gastroesophageal reflux disease)   . Shortness of breath     weather related  . Pneumonia 1965  .  Headache(784.0)     occasionally  . Joint pain   . Maintenance chemotherapy   . Urinary frequency   . History of shingles   . Anemia   . Acquired thrombocytopenia   . 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 Cancer     squamous cell carcinoma right superior  sulcus  . Lung cancer     right lung    ALLERGIES:  has No Known Allergies.  MEDICATIONS:  Current Outpatient Prescriptions  Medication Sig Dispense Refill  . alfuzosin (UROXATRAL) 10 MG 24 hr tablet Take 10 mg by mouth daily with breakfast.    . cycloSPORINE (RESTASIS) 0.05 % ophthalmic emulsion 1 drop 2 (two) times daily.    Marland Kitchen docusate sodium (COLACE) 100 MG capsule Take 100 mg by mouth daily.    . feeding supplement, RESOURCE BREEZE, (RESOURCE BREEZE) LIQD Take 1 Container by mouth daily at 3 pm.  0  . HYDROcodone-homatropine (HYCODAN) 5-1.5 MG/5ML syrup Take 5 mLs by mouth every 6 (six) hours as needed for cough. 240 mL 0  . Multiple Vitamin (MULITIVITAMIN WITH MINERALS) TABS Take 1 tablet by mouth daily.    Marland Kitchen oxyCODONE-acetaminophen (PERCOCET/ROXICET) 5-325 MG per tablet Take 1 tablet by mouth every 6 (six) hours as needed for severe pain. 90 tablet 0  . polyethylene glycol (MIRALAX / GLYCOLAX) packet Take 17 g by mouth daily.    . prochlorperazine (COMPAZINE) 5 MG tablet Take 1 tablet (5 mg total) by mouth every 6 (six) hours as needed for nausea or vomiting. 30 tablet 2   No current facility-administered medications for this visit.   Facility-Administered Medications Ordered in Other Visits  Medication Dose Route Frequency Provider Last Rate Last Dose  . heparin lock flush 100 unit/mL  500 Units Intracatheter Once PRN Terry Bears, MD      . nivolumab (OPDIVO) 260 mg in sodium chloride 0.9 % 100 mL chemo infusion  3.1 mg/kg (Treatment Plan Actual) Intravenous Once Terry Bears, MD 126 mL/hr at 05/27/14 1527 260 mg at 05/27/14 1527  . sodium chloride 0.9 % injection 10 mL  10 mL Intracatheter PRN Terry Bears, MD        SURGICAL HISTORY:  Past Surgical History  Procedure Laterality Date  . Other surgical history      benign tumor removed from left leg several years ago  . Other surgical history      had melanoma/squamous cell removed previously per patient  . Tumor  excision      Left knee  . Colonoscopy w/ biopsies and polypectomy    . Video bronchoscopy N/A 07/30/2013    Procedure: VIDEO BRONCHOSCOPY;  Surgeon: Terry Isaac, MD;  Location: [facility];  Service: Thoracic;  Laterality: N/A;  . Mediastinoscopy N/A 07/30/2013    Procedure: MEDIASTINOSCOPY;  Surgeon: Terry Isaac, MD;  Location: [facility];  Service: Thoracic;  Laterality: N/A;  . Video assisted thoracoscopy (vats)/wedge resection Right 07/30/2013    Procedure: VIDEO ASSISTED THORACOSCOPY;  Surgeon: Terry Isaac, MD;  Location: [facility];  Service: Thoracic;  Laterality: Right;  . Thoracotomy  07/30/2013    Procedure: THORACOTOMY FOR CHEST WALL RESECTION, RIGHT UPPER LOBECTOMY;  Surgeon: Terry Isaac, MD;  Location: [facility];  Service: Thoracic;;  . Adenoidectomy  as a child  . Tonsillectomy    . Portacath placement Left 10/30/2013    Procedure: INSERTION PORT-A-CATH WITH ULTRASOUND GUIDANCE AND FLUORO;  Surgeon: Terry Isaac, MD;  Location:  [facility];  Service: Thoracic;  Laterality: Left;    REVIEW OF SYSTEMS:  Constitutional: positive for fatigue Eyes: negative Ears, nose, mouth, throat, and face: negative Respiratory: positive for cough and dyspnea on exertion Cardiovascular: negative Gastrointestinal: negative Genitourinary:negative Integument/breast: positive for pruritus and rash Hematologic/lymphatic: negative Musculoskeletal:positive for arthralgias Neurological: positive for paresthesia Behavioral/Psych: negative Endocrine: negative Allergic/Immunologic: negative   PHYSICAL EXAMINATION: General appearance: alert, cooperative, appears stated age and no distress Head: Normocephalic, without obvious abnormality, atraumatic Neck: no adenopathy, no carotid bruit, no JVD, supple, symmetrical, trachea midline and thyroid not enlarged, symmetric, no tenderness/mass/nodules Lymph nodes: Cervical, supraclavicular, and axillary nodes normal. Resp: clear to auscultation  bilaterally Back: symmetric, no curvature. ROM normal. No CVA tenderness. Cardio: regular rate and rhythm, S1, S2 normal, no murmur, click, rub or gallop GI: soft, non-tender; bowel sounds normal; no masses,  no organomegaly Extremities: extremities normal, atraumatic, no cyanosis or edema and The left forearm and hand are cooler to touch than the right Neurologic: Alert and oriented X 3, normal strength and tone. Normal symmetric reflexes. Normal coordination and gait Skin: Left anterior chest Port-A-Cath incisions are well healed, no evidence of infection. Examination of the back reveals generalized dry skin with a few scattered erythematous macular lesions. There is extensive excoriation marks in this region. No other skin eruptions noted elsewhere on the body.  ECOG PERFORMANCE STATUS: 1 - Symptomatic but completely ambulatory  Blood pressure 117/75, pulse 75, temperature 98.5 F (36.9 C), temperature source Oral, resp. rate 18, height 6\' 2"  (1.88 m), weight 201 lb 12.8 oz (91.536 kg), SpO2 100 %.  LABORATORY DATA: Lab Results  Component Value Date   WBC 4.3 05/27/2014   HGB 11.8* 05/27/2014   HCT 36.3* 05/27/2014   MCV 99.6* 05/27/2014   PLT 219 05/27/2014      Chemistry      Component Value Date/Time   NA 140 05/27/2014 1254   NA 140 12/19/2013 0405   K 4.3 05/27/2014 1254   K 4.2 12/19/2013 0405   CL 104 12/19/2013 0405   CO2 25 05/27/2014 1254   CO2 26 12/19/2013 0405   BUN 17.1 05/27/2014 1254   BUN 14 12/19/2013 0405   CREATININE 0.9 05/27/2014 1254   CREATININE 0.87 12/19/2013 0405      Component Value Date/Time   CALCIUM 9.1 05/27/2014 1254   CALCIUM 9.0 12/19/2013 0405   ALKPHOS 88 05/27/2014 1254   ALKPHOS 126* 12/18/2013 1610   AST 21 05/27/2014 1254   AST 17 12/18/2013 1610   ALT 15 05/27/2014 1254   ALT 16 12/18/2013 1610   BILITOT 0.44 05/27/2014 1254   BILITOT 0.4 12/18/2013 1610       RADIOGRAPHIC STUDIES: No results found.  ASSESSMENT/PLAN:   This is a very pleasant 74 years old white male recently diagnosed with a stage IIIA non-small cell lung cancer. He completed a course of concurrent chemoradiation with weekly carboplatin and paclitaxel status post 5 cycle, this was followed by right upper lobectomy with lymph node dissection under the care of Dr. Servando Snare. The final pathology showed positive bronchial resection margin with a pathologic stage ypT3, ypN0. The patient underwent a course of radiotherapy to the positive resection margin but unfortunately the recent CT scan of the chest showed evidence for disease progression with new lytic rib lesion as well as enlarged mediastinal lymphadenopathy. The MRI of the brain was negative for brain metastasis. He is status post 3 cycles of systemic chemotherapy with carboplatin for AUC of 5  on day 1 and Abraxane 100 mg/M2 on days 1, 8 and 15 every 3 weeks. However his restaging CT scan showed evidence for disease progression. He is now being treated with immunotherapy with Nivolumab. He is status post 10 cycles. The most recent restaging CT scan shows an nearly completely resolve large left chest wall mass with improved mineralization of the previously lytic lesion and involvement of the posterior left sixth rib. The right paratracheal and subcarinal lymph nodes have decreased in size from previous exam in the high left paratracheal lymph node is slightly increased in the interval.  There is no evidence for metastatic disease in the abdomen or the pelvis. Patient was discussed with and also seen by Dr. Julien Nordmann.  Overall he's had significant improvement in his disease and we will continue with cycle #11 today as scheduled.he was given refill prescriptions for his Hycodan cough syrup, Percocet tablets and Compazine He will follow-up in 2 weeks prior to the start of cycle #12.     Carlton Adam, PA-C 05/27/2014  ADDENDUM: Hematology/Oncology Attending:  I had a face to face encounter with the  patient. I recommended his care plan. This is a very pleasant 74 years old white male with metastatic non-small cell lung cancer, squamous cell carcinoma. He is currently undergoing immunotherapy with Nivolumab status post 10 cycles and tolerating his treatment fairly well. He denied having any significant weakness or fatigue, no skin rash, no diarrhea. I recommended for the patient to continue his treatment with Nivolumab as a scheduled. He would come back for follow-up visit in 2 weeks for reevaluation before starting cycle #12. The patient was given a refill of Vicodin for cough and Percocet for pain management. He was advised to call immediately if he has any concerning symptoms in the interval.  Disclaimer: This note was dictated with voice recognition software. Similar sounding words can inadvertently be transcribed and may be missed upon review. [name]., MD 05/31/2014

## 2014-05-27 NOTE — Patient Instructions (Signed)
[facility] Discharge Instructions  Today you received: Nivolumab  To help prevent nausea and vomiting after your treatment, we encourage you to take your nausea medication as directed.    If you develop nausea and vomiting that is not controlled by your nausea medication, call the clinic.   BELOW ARE SYMPTOMS THAT SHOULD BE REPORTED IMMEDIATELY:  *FEVER GREATER THAN 100.5 F  *CHILLS WITH OR WITHOUT FEVER  NAUSEA AND VOMITING THAT IS NOT CONTROLLED WITH YOUR NAUSEA MEDICATION  *UNUSUAL SHORTNESS OF BREATH  *UNUSUAL BRUISING OR BLEEDING  TENDERNESS IN MOUTH AND THROAT WITH OR WITHOUT PRESENCE OF ULCERS  *URINARY PROBLEMS  *BOWEL PROBLEMS  UNUSUAL RASH Items with * indicate a potential emergency and should be followed up as soon as possible.  Feel free to call the clinic you have any questions or concerns. The clinic phone number is [phone].

## 2014-05-27 NOTE — Patient Instructions (Signed)

## 2014-05-27 NOTE — Telephone Encounter (Signed)
s.w. pt wife and advised on 3.15 appt.Marland Kitchen..Marland Kitchenhe will also get print out from chemo

## 2014-05-30 NOTE — Patient Instructions (Signed)
Follow up in 2 weeks, prior to your next scheduled cycle of immunotherapy 

## 2014-06-10 ENCOUNTER — Other Ambulatory Visit (HOSPITAL_BASED_OUTPATIENT_CLINIC_OR_DEPARTMENT_OTHER): Payer: Medicare Other

## 2014-06-10 ENCOUNTER — Ambulatory Visit: Payer: Medicare Other

## 2014-06-10 ENCOUNTER — Telehealth: Payer: Self-pay | Admitting: *Deleted

## 2014-06-10 ENCOUNTER — Other Ambulatory Visit: Payer: Medicare Other

## 2014-06-10 ENCOUNTER — Ambulatory Visit (HOSPITAL_BASED_OUTPATIENT_CLINIC_OR_DEPARTMENT_OTHER): Payer: Medicare Other | Admitting: Nurse Practitioner

## 2014-06-10 ENCOUNTER — Ambulatory Visit (HOSPITAL_BASED_OUTPATIENT_CLINIC_OR_DEPARTMENT_OTHER): Payer: Medicare Other

## 2014-06-10 ENCOUNTER — Telehealth: Payer: Self-pay | Admitting: Nurse Practitioner

## 2014-06-10 VITALS — BP 122/64 | HR 73 | Temp 98.1°F | Resp 18 | Ht 74.0 in | Wt 203.1 lb

## 2014-06-10 DIAGNOSIS — C3492 Malignant neoplasm of unspecified part of left bronchus or lung: Secondary | ICD-10-CM

## 2014-06-10 DIAGNOSIS — Z5112 Encounter for antineoplastic immunotherapy: Secondary | ICD-10-CM | POA: Diagnosis not present

## 2014-06-10 DIAGNOSIS — Z79899 Other long term (current) drug therapy: Secondary | ICD-10-CM

## 2014-06-10 DIAGNOSIS — Z95828 Presence of other vascular implants and grafts: Secondary | ICD-10-CM

## 2014-06-10 DIAGNOSIS — C3411 Malignant neoplasm of upper lobe, right bronchus or lung: Secondary | ICD-10-CM | POA: Diagnosis not present

## 2014-06-10 LAB — COMPREHENSIVE METABOLIC PANEL (CC13)
ALT: 25 U/L (ref 0–55)
AST: 24 U/L (ref 5–34)
Albumin: 3.3 g/dL — ABNORMAL LOW (ref 3.5–5.0)
Alkaline Phosphatase: 91 U/L (ref 40–150)
Anion Gap: 8 mEq/L (ref 3–11)
BILIRUBIN TOTAL: 0.43 mg/dL (ref 0.20–1.20)
BUN: 16.2 mg/dL (ref 7.0–26.0)
CALCIUM: 8.9 mg/dL (ref 8.4–10.4)
CO2: 27 mEq/L (ref 22–29)
CREATININE: 0.8 mg/dL (ref 0.7–1.3)
Chloride: 106 mEq/L (ref 98–109)
EGFR: 88 mL/min/{1.73_m2} — ABNORMAL LOW (ref >90)
Glucose: 96 mg/dl (ref 70–140)
Potassium: 4.5 mEq/L (ref 3.5–5.1)
SODIUM: 140 meq/L (ref 136–145)
Total Protein: 6.4 g/dL (ref 6.4–8.3)

## 2014-06-10 LAB — CBC WITH DIFFERENTIAL/PLATELET
BASO%: 0.6 % (ref 0.0–2.0)
BASOS ABS: 0 10*3/uL (ref 0.0–0.1)
EOS%: 2.8 % (ref 0.0–7.0)
Eosinophils Absolute: 0.1 10*3/uL (ref 0.0–0.5)
HEMATOCRIT: 36.5 % — AB (ref 38.4–49.9)
HEMOGLOBIN: 11.8 g/dL — AB (ref 13.0–17.1)
LYMPH#: 0.7 10*3/uL — AB (ref 0.9–3.3)
LYMPH%: 17.9 % (ref 14.0–49.0)
MCH: 32.3 pg (ref 27.2–33.4)
MCHC: 32.3 g/dL (ref 32.0–36.0)
MCV: 100 fL — ABNORMAL HIGH (ref 79.3–98.0)
MONO#: 0.6 10*3/uL (ref 0.1–0.9)
MONO%: 14.3 % — AB (ref 0.0–14.0)
NEUT%: 64.4 % (ref 39.0–75.0)
NEUTROS ABS: 2.6 10*3/uL (ref 1.5–6.5)
Platelets: 228 10*3/uL (ref 140–400)
RBC: 3.65 10*6/uL — ABNORMAL LOW (ref 4.20–5.82)
RDW: 14.1 % (ref 11.0–14.6)
WBC: 4 10*3/uL (ref 4.0–10.3)

## 2014-06-10 LAB — TSH CHCC: TSH: 2.147 m[IU]/L (ref 0.320–4.118)

## 2014-06-10 MED ORDER — SODIUM CHLORIDE 0.9 % IV SOLN
3.0000 mg/kg | Freq: Once | INTRAVENOUS | Status: AC
  Administered 2014-06-10: 280 mg via INTRAVENOUS
  Filled 2014-06-10: qty 28

## 2014-06-10 MED ORDER — HEPARIN SOD (PORK) LOCK FLUSH 100 UNIT/ML IV SOLN
500.0000 [IU] | Freq: Once | INTRAVENOUS | Status: AC | PRN
Start: 2014-06-10 — End: 2014-06-10
  Administered 2014-06-10: 500 [IU]
  Filled 2014-06-10: qty 5

## 2014-06-10 MED ORDER — SODIUM CHLORIDE 0.9 % IJ SOLN
10.0000 mL | INTRAMUSCULAR | Status: DC | PRN
  Administered 2014-06-10: 10 mL via INTRAVENOUS
  Filled 2014-06-10: qty 10

## 2014-06-10 MED ORDER — SODIUM CHLORIDE 0.9 % IV SOLN
Freq: Once | INTRAVENOUS | Status: AC
  Administered 2014-06-10: 15:00:00 via INTRAVENOUS

## 2014-06-10 MED ORDER — SODIUM CHLORIDE 0.9 % IJ SOLN
10.0000 mL | INTRAMUSCULAR | Status: DC | PRN
  Administered 2014-06-10: 10 mL
  Filled 2014-06-10: qty 10

## 2014-06-10 MED ORDER — SODIUM CHLORIDE 0.9 % IV SOLN: 3.0000 mg/kg | Freq: Once | INTRAVENOUS | Status: DC

## 2014-06-10 NOTE — Telephone Encounter (Signed)
Per staff message and POF I have scheduled appts. Advised scheduler of appts. JMW  

## 2014-06-10 NOTE — Patient Instructions (Signed)
[facility] Discharge Instructions for Patients Receiving Chemotherapy  Today you received the following chemotherapy agents: nivolumab  To help prevent nausea and vomiting after your treatment, we encourage you to take your nausea medication.  Take it as often as prescribed.     If you develop nausea and vomiting that is not controlled by your nausea medication, call the clinic. If it is after clinic hours your family physician or the after hours number for the clinic or go to the Emergency Department.   BELOW ARE SYMPTOMS THAT SHOULD BE REPORTED IMMEDIATELY:  *FEVER GREATER THAN 100.5 F  *CHILLS WITH OR WITHOUT FEVER  NAUSEA AND VOMITING THAT IS NOT CONTROLLED WITH YOUR NAUSEA MEDICATION  *UNUSUAL SHORTNESS OF BREATH  *UNUSUAL BRUISING OR BLEEDING  TENDERNESS IN MOUTH AND THROAT WITH OR WITHOUT PRESENCE OF ULCERS  *URINARY PROBLEMS  *BOWEL PROBLEMS  UNUSUAL RASH Items with * indicate a potential emergency and should be followed up as soon as possible.  Feel free to call the clinic you have any questions or concerns. The clinic phone number is [phone].   I have been informed and understand all the instructions given to me. I know to contact the clinic, my physician, or go to the Emergency Department if any problems should occur. I do not have any questions at this time, but understand that I may call the clinic during office hours   should I have any questions or need assistance in obtaining follow up care.    __________________________________________  _____________  __________ Signature of Patient or Authorized Representative            Date                   Time    __________________________________________ Nurse's Signature

## 2014-06-10 NOTE — Patient Instructions (Signed)

## 2014-06-10 NOTE — Telephone Encounter (Signed)
Pt confirmed labs/ov per 03/15 POF, gave pt AVS and calendar..... KJ, sent msg to move chemo down and add chemo to next chemo visit.Marland KitchenMarland Kitchen

## 2014-06-10 NOTE — Progress Notes (Addendum)
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  PRIOR THERAPY:  1) Concurrent chemoradiation with weekly carboplatin for an AUC of 2 and paclitaxel 45 mg per meter squared. Status post 5 cycles. 2) Bronchoscopy, mediastinoscopy, right posterior lateral thoracotomy with right upper lobectomy, lymph node dissection,  and posterior approach to en bloc chest wall resection, ribs 2, 3, and 4, with positive bronchial resection margin. 3) palliative radiotherapy to the best of resection margin under the care of Dr. Tammi Klippel completed on 10/14/2013. 4) systemic chemotherapy with carboplatin for an AUC of 5 given on day 1 and Abraxane 100 mg/m given days 1, 8 and 15 every 3 weeks status post 3 cycles discontinued 12/31/2013 secondary to disease progression  INTERVAL HISTORY:   Terry Robles returns as scheduled. He completed cycle 11 nivolumab 05/27/2014. He denies nausea/vomiting. No diarrhea. He has a stable pruritic rash over his back. He has stable dyspnea on exertion and a stable cough. No hemoptysis. He denies fever. He reports a good appetite. He is gaining weight. Over the past few months he has noted the diameter of his stool is smaller. No blood or pain with bowel movements. His last colonoscopy was 4 years ago. He has stable chronic back pain. He takes Percocet as needed.  Objective:  Vital signs in last 24 hours:  Blood pressure 122/64, pulse 73, temperature 98.1 F (36.7 C), temperature source Oral, resp. rate 18, height 6\' 2"  (1.88 m), weight 203 lb 1.6 oz (92.126 kg), SpO2 100 %.    HEENT: No thrush or ulcers. Lymphatics: No palpable cervical or supraclavicular lymph nodes. Resp: Breath sounds diminished at the bases bilaterally. Lungs are clear. Cardio: Regular rate and rhythm. GI:  Abdomen soft and nontender. No hepatomegaly. Vascular: No leg edema. Neuro: Alert and oriented. Gait normal.  Skin: Skin in general has a dry appearance. Scattered erythematous macular lesions over the back. Port-A-Cath without erythema.  Lab Results:  Lab Results  Component Value Date   WBC 4.0 06/10/2014   HGB 11.8* 06/10/2014   HCT 36.5* 06/10/2014   MCV 100.0* 06/10/2014   PLT 228 06/10/2014   NEUTROABS 2.6 06/10/2014   TSH pending  Imaging:  No results found.  Medications: I have reviewed the patient's current medications.  Assessment/Plan: 1. Metastatic non-small cell lung cancer currently on active treatment with nivolumab with 11 cycles completed to date. Restaging CT evaluation 04/25/2014 showed near-complete resolution of the large left chest wall mass, decrease in size of right paratracheal and subcarinal lymph nodes; high left paratracheal lymph node slightly increased. 2. Small diameter stools, new over the past few months.   Disposition: [name] appears stable. He has completed 11 cycles of nivolumab. Plan to proceed with cycle 12 today as scheduled. We are referring him for restaging CT scans prior to the cycle 13.  He will contact Dr. Sydell Axon, gastroenterology, if he continues to have small diameter stools or develops any new problems such as bleeding or pain with bowel movements.  He will return for a follow-up visit in 2 weeks. He will contact the office in the interim with any problems.  Patient seen with Dr. Julien Nordmann.    Terry Robles ANP/GNP-BC   06/10/2014  2:18 PM  ADDENDUM: Hematology/Oncology Attending:  I had a face to face encounter with the patient. I recommended his  care plan. This is a very pleasant 74 years old white male with metastatic non-small cell lung cancer, squamous cell carcinoma was currently undergoing treatment with immunotherapy with Nivolumab status post 11 cycles. The patient is tolerating his treatment fairly well with no  significant adverse effects. He is complaining of small diameter of his stool recently. I recommended for him to see his gastroenterologist for evaluation. The patient will proceed with cycle #12 of his immunotherapy with Nivolumab today as a scheduled. He would come back for follow-up visit in 2 weeks for reevaluation after repeating CT scan of the chest, abdomen and pelvis for restaging of his disease. He was advised to call immediately if he has any concerning symptoms in the interval.  Disclaimer: This note was dictated with voice recognition software. Similar sounding words can inadvertently be transcribed and may be missed upon review. [name]., MD 06/11/2014

## 2014-06-11 ENCOUNTER — Encounter: Payer: Self-pay | Admitting: Nurse Practitioner

## 2014-06-16 ENCOUNTER — Ambulatory Visit (HOSPITAL_COMMUNITY)
Admission: RE | Admit: 2014-06-16 | Discharge: 2014-06-16 | Disposition: A | Discharge status: Home / Self Care | Payer: Medicare Other | Source: Ambulatory Visit | Attending: Nurse Practitioner | Admitting: Nurse Practitioner

## 2014-06-16 ENCOUNTER — Encounter (HOSPITAL_COMMUNITY): Payer: Self-pay

## 2014-06-16 DIAGNOSIS — Z87891 Personal history of nicotine dependence: Secondary | ICD-10-CM | POA: Diagnosis not present

## 2014-06-16 DIAGNOSIS — Z9889 Other specified postprocedural states: Secondary | ICD-10-CM | POA: Insufficient documentation

## 2014-06-16 DIAGNOSIS — Z923 Personal history of irradiation: Secondary | ICD-10-CM | POA: Diagnosis not present

## 2014-06-16 DIAGNOSIS — C3411 Malignant neoplasm of upper lobe, right bronchus or lung: Secondary | ICD-10-CM | POA: Diagnosis not present

## 2014-06-16 DIAGNOSIS — K219 Gastro-esophageal reflux disease without esophagitis: Secondary | ICD-10-CM | POA: Insufficient documentation

## 2014-06-16 DIAGNOSIS — Z9221 Personal history of antineoplastic chemotherapy: Secondary | ICD-10-CM | POA: Insufficient documentation

## 2014-06-16 MED ORDER — IOHEXOL 300 MG/ML  SOLN
100.0000 mL | Freq: Once | INTRAMUSCULAR | Status: AC | PRN
  Administered 2014-06-16: 100 mL via INTRAVENOUS

## 2014-06-24 ENCOUNTER — Ambulatory Visit (HOSPITAL_BASED_OUTPATIENT_CLINIC_OR_DEPARTMENT_OTHER): Payer: Medicare Other

## 2014-06-24 ENCOUNTER — Encounter: Payer: Self-pay | Admitting: Physician Assistant

## 2014-06-24 ENCOUNTER — Other Ambulatory Visit (HOSPITAL_BASED_OUTPATIENT_CLINIC_OR_DEPARTMENT_OTHER): Payer: Medicare Other

## 2014-06-24 ENCOUNTER — Other Ambulatory Visit: Payer: Medicare Other

## 2014-06-24 ENCOUNTER — Ambulatory Visit (HOSPITAL_BASED_OUTPATIENT_CLINIC_OR_DEPARTMENT_OTHER): Payer: Medicare Other | Admitting: Physician Assistant

## 2014-06-24 VITALS — BP 111/59 | HR 72 | Temp 98.0°F | Resp 18 | Ht 74.0 in | Wt 203.6 lb

## 2014-06-24 DIAGNOSIS — M25552 Pain in left hip: Secondary | ICD-10-CM

## 2014-06-24 DIAGNOSIS — R591 Generalized enlarged lymph nodes: Secondary | ICD-10-CM

## 2014-06-24 DIAGNOSIS — C3492 Malignant neoplasm of unspecified part of left bronchus or lung: Secondary | ICD-10-CM

## 2014-06-24 DIAGNOSIS — C3411 Malignant neoplasm of upper lobe, right bronchus or lung: Secondary | ICD-10-CM

## 2014-06-24 DIAGNOSIS — Z5112 Encounter for antineoplastic immunotherapy: Secondary | ICD-10-CM

## 2014-06-24 DIAGNOSIS — M899 Disorder of bone, unspecified: Secondary | ICD-10-CM | POA: Diagnosis not present

## 2014-06-24 DIAGNOSIS — Z95828 Presence of other vascular implants and grafts: Secondary | ICD-10-CM

## 2014-06-24 DIAGNOSIS — M25511 Pain in right shoulder: Secondary | ICD-10-CM | POA: Diagnosis not present

## 2014-06-24 DIAGNOSIS — M25551 Pain in right hip: Secondary | ICD-10-CM

## 2014-06-24 LAB — CBC WITH DIFFERENTIAL/PLATELET
BASO%: 0.4 % (ref 0.0–2.0)
Basophils Absolute: 0 10*3/uL (ref 0.0–0.1)
EOS ABS: 0.1 10*3/uL (ref 0.0–0.5)
EOS%: 2.1 % (ref 0.0–7.0)
HCT: 37 % — ABNORMAL LOW (ref 38.4–49.9)
HGB: 12.3 g/dL — ABNORMAL LOW (ref 13.0–17.1)
LYMPH%: 18.7 % (ref 14.0–49.0)
MCH: 33.3 pg (ref 27.2–33.4)
MCHC: 33.2 g/dL (ref 32.0–36.0)
MCV: 100.3 fL — ABNORMAL HIGH (ref 79.3–98.0)
MONO#: 0.7 10*3/uL (ref 0.1–0.9)
MONO%: 12.4 % (ref 0.0–14.0)
NEUT#: 3.5 10*3/uL (ref 1.5–6.5)
NEUT%: 66.4 % (ref 39.0–75.0)
Platelets: 216 10*3/uL (ref 140–400)
RBC: 3.69 10*6/uL — ABNORMAL LOW (ref 4.20–5.82)
RDW: 14.2 % (ref 11.0–14.6)
WBC: 5.2 10*3/uL (ref 4.0–10.3)
lymph#: 1 10*3/uL (ref 0.9–3.3)
nRBC: 0 % (ref 0–0)

## 2014-06-24 LAB — COMPREHENSIVE METABOLIC PANEL (CC13)
ALBUMIN: 3.3 g/dL — AB (ref 3.5–5.0)
ALT: 18 U/L (ref 0–55)
AST: 16 U/L (ref 5–34)
Alkaline Phosphatase: 91 U/L (ref 40–150)
Anion Gap: 8 mEq/L (ref 3–11)
BUN: 16.7 mg/dL (ref 7.0–26.0)
CO2: 24 mEq/L (ref 22–29)
Calcium: 9 mg/dL (ref 8.4–10.4)
Chloride: 106 mEq/L (ref 98–109)
Creatinine: 0.8 mg/dL (ref 0.7–1.3)
EGFR: 89 mL/min/{1.73_m2} — ABNORMAL LOW (ref >90)
Glucose: 96 mg/dl (ref 70–140)
Potassium: 4.3 mEq/L (ref 3.5–5.1)
Sodium: 138 mEq/L (ref 136–145)
TOTAL PROTEIN: 6.5 g/dL (ref 6.4–8.3)
Total Bilirubin: 0.39 mg/dL (ref 0.20–1.20)

## 2014-06-24 MED ORDER — SODIUM CHLORIDE 0.9 % IJ SOLN
10.0000 mL | INTRAMUSCULAR | Status: DC | PRN
  Filled 2014-06-24: qty 10

## 2014-06-24 MED ORDER — HEPARIN SOD (PORK) LOCK FLUSH 100 UNIT/ML IV SOLN
500.0000 [IU] | Freq: Once | INTRAVENOUS | Status: DC | PRN
  Filled 2014-06-24: qty 5

## 2014-06-24 MED ORDER — ATROPINE SULFATE 1 MG/ML IJ SOLN: 0.5000 mg | Freq: Once | INTRAMUSCULAR | Status: DC

## 2014-06-24 MED ORDER — SODIUM CHLORIDE 0.9 % IV SOLN
Freq: Once | INTRAVENOUS | Status: AC
  Administered 2014-06-24: 16:00:00 via INTRAVENOUS

## 2014-06-24 MED ORDER — SODIUM CHLORIDE 0.9 % IJ SOLN
10.0000 mL | INTRAMUSCULAR | Status: DC | PRN
  Administered 2014-06-24: 10 mL via INTRAVENOUS
  Filled 2014-06-24: qty 10

## 2014-06-24 MED ORDER — SODIUM CHLORIDE 0.9 % IV SOLN
280.0000 mg | Freq: Once | INTRAVENOUS | Status: AC
  Administered 2014-06-24: 280 mg via INTRAVENOUS
  Filled 2014-06-24: qty 28

## 2014-06-24 NOTE — Progress Notes (Addendum)
[place]  [phone] [phone]  SHARED VISIT PROGRESS NOTE  [name], MD 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   Primary site: Lung (Right)   Staging method: AJCC 7th Edition   Clinical: Stage IIIA (T3, N2, M0) signed by Curt Bears, MD on 05/09/2013  2:56 PM   Summary: Stage IIIA (T3, N2, M0)  PRIOR THERAPY:  1) Concurrent chemoradiation with weekly carboplatin for an AUC of 2 and paclitaxel 45 mg per meter squared. Status post 5 cycles. 2) Bronchoscopy, mediastinoscopy, right posterior lateral thoracotomy with right upper lobectomy, lymph node dissection,  and posterior approach to en bloc chest wall resection, ribs 2, 3, and 4, with positive bronchial resection margin. 3) palliative radiotherapy to the best of resection margin under the care of Dr. Tammi Klippel completed on 10/14/2013. 4) systemic chemotherapy with carboplatin for an AUC of 5 given on day 1 and Abraxane 100 mg/m given days 1, 8 and 15 every 3 weeks status post 3 cycles discontinued 12/31/2013 secondary to disease progression  CURRENT THERAPY: Immunotherapy with Nivolumab 3 mg/kg given every 2 weeks.  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    INTERVAL HISTORY: Terry Robles 74 y.o. male returns for followup visit. Overall is tolerating his treatment with immunotherapy with  Nivolumab without significant difficulty. He denies any change in his baseline shortness of breath and actually feels as if his breathing is slightly better He denies diarrhea, except for brief episodes after drinking oral contrast for his CT scans.He reports the skin rash on his back is stable. He states the area is s still "itchy", denies any other skin eruptions elsewhere on his body. He continues to have some residual numbness/tingling in his toes as well as his right hand from previous chemotherapy. This remains stable.  He has a cough which is been long-standing, primarily dry although occasionally productive of clear secretions. This is well managed with Delsym cough syrup during the day and Hycodan cough syrup at night. He continues to complain of pain in both hips and his right shoulder since the start of treatment. His been taking Percocet to 3 times a day with reasonable relief.he requests refill prescriptions for his Hycodan cough syrup, Percocet and Compazine. He voiced no other specific complaints today.  He has no hemoptysis. He denied having any significant weight loss or night sweats. He has no nausea or vomiting. The bruising on his right wrist has resolved. He is here to proceed with cycle #13 of his immunotherapy with Nivolumab. He recently had a restaging CT scan of the chest, abdomen and pelvis and also presents to discuss results.    MEDICAL HISTORY: Past Medical  History  Diagnosis Date  . Hypercholesteremia 05/14/2011  . BPH (benign prostatic hyperplasia) 05/14/2011    takes Uroxatral daily  . GERD (gastroesophageal reflux disease)   . Shortness of breath     weather related  . Pneumonia 1965  . Headache(784.0)     occasionally  .  Joint pain   . Maintenance chemotherapy   . Urinary frequency   . History of shingles   . Anemia   . Acquired thrombocytopenia   . 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 Cancer     squamous cell carcinoma right superior sulcus  . Lung cancer     right lung    ALLERGIES:  has No Known Allergies.  MEDICATIONS:  Current Outpatient Prescriptions  Medication Sig Dispense Refill  . alfuzosin (UROXATRAL) 10 MG 24 hr tablet Take 10 mg by mouth daily with breakfast.    . cycloSPORINE (RESTASIS) 0.05 % ophthalmic emulsion 1 drop 2 (two) times daily.    Marland Kitchen docusate sodium (COLACE) 100 MG capsule Take 100 mg by mouth daily.    . feeding supplement, RESOURCE BREEZE, (RESOURCE BREEZE) LIQD Take 1 Container by mouth daily at 3 pm.  0  . HYDROcodone-homatropine (HYCODAN) 5-1.5 MG/5ML syrup Take 5 mLs by mouth every 6 (six) hours as needed for cough. 240 mL 0  . Multiple Vitamin (MULITIVITAMIN WITH MINERALS) TABS Take 1 tablet by mouth daily.    Marland Kitchen oxyCODONE-acetaminophen (PERCOCET/ROXICET) 5-325 MG per tablet Take 1 tablet by mouth every 6 (six) hours as needed for severe pain. 90 tablet 0  . polyethylene glycol (MIRALAX / GLYCOLAX) packet Take 17 g by mouth daily.    . prochlorperazine (COMPAZINE) 5 MG tablet Take 1 tablet (5 mg total) by mouth every 6 (six) hours as needed for nausea or vomiting. 30 tablet 2   No current facility-administered medications for this visit.   Facility-Administered Medications Ordered in Other Visits  Medication Dose Route Frequency Provider Last Rate Last Dose  . heparin lock flush 100 unit/mL  500 Units Intracatheter Once PRN Curt Bears, MD      . nivolumab (OPDIVO) 280 mg in sodium chloride 0.9 % 100 mL chemo infusion  280 mg Intravenous Once Curt Bears, MD      . sodium chloride 0.9 % injection 10 mL  10 mL Intracatheter PRN Curt Bears, MD        SURGICAL HISTORY:  Past Surgical History  Procedure Laterality Date  . Other surgical  history      benign tumor removed from left leg several years ago  . Other surgical history      had melanoma/squamous cell removed previously per patient  . Tumor excision      Left knee  . Colonoscopy w/ biopsies and polypectomy    . Video bronchoscopy N/A 07/30/2013    Procedure: VIDEO BRONCHOSCOPY;  Surgeon: Grace Isaac, MD;  Location: [facility];  Service: Thoracic;  Laterality: N/A;  . Mediastinoscopy N/A 07/30/2013    Procedure: MEDIASTINOSCOPY;  Surgeon: Grace Isaac, MD;  Location: [facility];  Service: Thoracic;  Laterality: N/A;  . Video assisted thoracoscopy (vats)/wedge resection Right 07/30/2013    Procedure: VIDEO ASSISTED THORACOSCOPY;  Surgeon: Grace Isaac, MD;  Location: [facility];  Service: Thoracic;  Laterality: Right;  . Thoracotomy  07/30/2013    Procedure: THORACOTOMY FOR CHEST WALL RESECTION, RIGHT UPPER LOBECTOMY;  Surgeon: Grace Isaac, MD;  Location: [facility];  Service: Thoracic;;  .  Adenoidectomy  as a child  . Tonsillectomy    . Portacath placement Left 10/30/2013    Procedure: INSERTION PORT-A-CATH WITH ULTRASOUND GUIDANCE AND FLUORO;  Surgeon: Grace Isaac, MD;  Location: [facility];  Service: Thoracic;  Laterality: Left;    REVIEW OF SYSTEMS:  Constitutional: positive for fatigue Eyes: negative Ears, nose, mouth, throat, and face: negative Respiratory: positive for cough and dyspnea on exertion Cardiovascular: negative Gastrointestinal: negative Genitourinary:negative Integument/breast: positive for pruritus and rash Hematologic/lymphatic: negative Musculoskeletal:positive for arthralgias Neurological: positive for paresthesia Behavioral/Psych: negative Endocrine: negative Allergic/Immunologic: negative   PHYSICAL EXAMINATION: General appearance: alert, cooperative, appears stated age and no distress Head: Normocephalic, without obvious abnormality, atraumatic Neck: no adenopathy, no carotid bruit, no JVD, supple, symmetrical, trachea midline and  thyroid not enlarged, symmetric, no tenderness/mass/nodules Lymph nodes: Cervical, supraclavicular, and axillary nodes normal. Resp: clear to auscultation bilaterally Back: symmetric, no curvature. ROM normal. No CVA tenderness. Cardio: regular rate and rhythm, S1, S2 normal, no murmur, click, rub or gallop GI: soft, non-tender; bowel sounds normal; no masses,  no organomegaly Extremities: extremities normal, atraumatic, no cyanosis or edema and The left forearm and hand are cooler to touch than the right Neurologic: Alert and oriented X 3, normal strength and tone. Normal symmetric reflexes. Normal coordination and gait Skin: Left anterior chest Port-A-Cath incisions are well healed, no evidence of infection. Examination of the back reveals generalized dry skin with a few scattered erythematous macular lesions. There is extensive excoriation marks in this region. No other skin eruptions noted elsewhere on the body.  ECOG PERFORMANCE STATUS: 1 - Symptomatic but completely ambulatory  Blood pressure 111/59, pulse 72, temperature 98 F (36.7 C), temperature source Oral, resp. rate 18, height 6\' 2"  (1.88 m), weight 203 lb 9.6 oz (92.352 kg), SpO2 100 %.  LABORATORY DATA: Lab Results  Component Value Date   WBC 5.2 06/24/2014   HGB 12.3* 06/24/2014   HCT 37.0* 06/24/2014   MCV 100.3* 06/24/2014   PLT 216 06/24/2014      Chemistry      Component Value Date/Time   NA 138 06/24/2014 1249   NA 140 12/19/2013 0405   K 4.3 06/24/2014 1249   K 4.2 12/19/2013 0405   CL 104 12/19/2013 0405   CO2 24 06/24/2014 1249   CO2 26 12/19/2013 0405   BUN 16.7 06/24/2014 1249   BUN 14 12/19/2013 0405   CREATININE 0.8 06/24/2014 1249   CREATININE 0.87 12/19/2013 0405      Component Value Date/Time   CALCIUM 9.0 06/24/2014 1249   CALCIUM 9.0 12/19/2013 0405   ALKPHOS 91 06/24/2014 1249   ALKPHOS 126* 12/18/2013 1610   AST 16 06/24/2014 1249   AST 17 12/18/2013 1610   ALT 18 06/24/2014 1249    ALT 16 12/18/2013 1610   BILITOT 0.39 06/24/2014 1249   BILITOT 0.4 12/18/2013 1610       RADIOGRAPHIC STUDIES: Ct Chest W Contrast  06/16/2014   CLINICAL DATA:  T3N2M0 squamous cell carcinoma RIGHT upper lobe post RIGHT upper lobectomy, chemotherapy, and radiation therapy, restaging, former smoker,, history GERD  EXAM: CT CHEST, ABDOMEN, AND PELVIS WITH CONTRAST  TECHNIQUE: Multidetector CT imaging of the chest, abdomen and pelvis was performed following the standard protocol during bolus administration of intravenous contrast. Sagittal and coronal MPR images reconstructed from axial data set.  CONTRAST:  127mL OMNIPAQUE IOHEXOL 300 MG/ML SOLN IV. Dilute oral contrast.  COMPARISON:  04/25/2014 and 12/27/2013 ; correlation PET-CT exams 10/18/2013, 04/25/2013  FINDINGS:  CT CHEST FINDINGS  LEFT subclavian Port-A-Cath, tip in SVC.  Scattered atherosclerotic calcifications aorta and coronary arteries.  Upper normal size ascending thoracic aorta 3.8 x 3.8 cm image 28.  Ovoid subcutaneous lesion with dependent calcification anterolateral LEFT chest wall 5.5 x 1.8 x 4.1 cm again seen.  Post RIGHT upper lobectomy with prior rib resections and thoracoplasty.  High LEFT superior mediastinal lymph node 12 mm short axis image 11 unchanged.  Precarinal lymph node 5 mm short axis image 21 previously 9 mm.  No additional thoracic adenopathy.  Moderate-sized RIGHT pleural effusion, partially loculated and anterior lung base.  Scarring and volume loss superior segment RIGHT lower lobe unchanged.  Interstitial changes at the lateral RIGHT lung base unchanged.  Paramediastinal radiation fibrosis changes stable.  No acute infiltrate, LEFT pleural effusion, pneumothorax, or new pulmonary mass/nodule.  No definite residual soft tissue mass at previously seen metastasis at posterior LEFT seventh rib.  CT ABDOMEN AND PELVIS FINDINGS  Calcified gallstones in gallbladder.  Liver, spleen, pancreas, kidneys, and adrenal glands normal.   Scattered atherosclerotic calcifications without aortic aneurysm.  Stomach incompletely distended with suboptimal assessment of wall thickness.  Large and small bowel loops normal appearance.  Normal appendix.  Mild prostatic enlargement.  Bladder and ureters unremarkable.  LEFT inguinal hernia containing fat.  No mass, adenopathy, free fluid, free air or inflammatory process.  Small umbilical hernia containing fat.  Questionable subtle lucent lesion within the L4 vertebral body, image 87, unchanged ; no FDG accumulation at this site on to prior PET-CT exams.  IMPRESSION: Stable postsurgical and postradiation therapy changes of RIGHT hemi thorax.  Resolution of soft tissue mass previously seen at the posterior LEFT seventh rib.  Persistent RIGHT pleural effusion.  No new intrathoracic abnormalities.  Cholelithiasis.  Small LEFT inguinal and umbilical hernias containing fat.  Prostatic enlargement.  No new intra-abdominal or intrapelvic abnormalities.  Question subtle lucent lesion within the L4 vertebra, unchanged since 04/25/2014 and 12/27/2013, nonspecific, could represent a subtle vertebral hemangioma; the lack of FDG accumulation at this site on to prior PET-CT studies suggests this is not in metastatic lesion.  Upper normal size of ascending thoracic aorta; recommend annual imaging followup by CTA or MRA. This recommendation follows 2010 ACCF/AHA/AATS/ACR/ASA/SCA/SCAI/SIR/STS/SVM Guidelines for the Diagnosis and Management of Patients with Thoracic Aortic Disease. Circulation. 2010; 121: J500-X381   Electronically Signed   By: Lavonia Dana M.D.   On: 06/16/2014 09:21   Ct Abdomen Pelvis W Contrast  06/16/2014   CLINICAL DATA:  T3N2M0 squamous cell carcinoma RIGHT upper lobe post RIGHT upper lobectomy, chemotherapy, and radiation therapy, restaging, former smoker,, history GERD  EXAM: CT CHEST, ABDOMEN, AND PELVIS WITH CONTRAST  TECHNIQUE: Multidetector CT imaging of the chest, abdomen and pelvis was  performed following the standard protocol during bolus administration of intravenous contrast. Sagittal and coronal MPR images reconstructed from axial data set.  CONTRAST:  162mL OMNIPAQUE IOHEXOL 300 MG/ML SOLN IV. Dilute oral contrast.  COMPARISON:  04/25/2014 and 12/27/2013 ; correlation PET-CT exams 10/18/2013, 04/25/2013  FINDINGS: CT CHEST FINDINGS  LEFT subclavian Port-A-Cath, tip in SVC.  Scattered atherosclerotic calcifications aorta and coronary arteries.  Upper normal size ascending thoracic aorta 3.8 x 3.8 cm image 28.  Ovoid subcutaneous lesion with dependent calcification anterolateral LEFT chest wall 5.5 x 1.8 x 4.1 cm again seen.  Post RIGHT upper lobectomy with prior rib resections and thoracoplasty.  High LEFT superior mediastinal lymph node 12 mm short axis image 11 unchanged.  Precarinal lymph node 5  mm short axis image 21 previously 9 mm.  No additional thoracic adenopathy.  Moderate-sized RIGHT pleural effusion, partially loculated and anterior lung base.  Scarring and volume loss superior segment RIGHT lower lobe unchanged.  Interstitial changes at the lateral RIGHT lung base unchanged.  Paramediastinal radiation fibrosis changes stable.  No acute infiltrate, LEFT pleural effusion, pneumothorax, or new pulmonary mass/nodule.  No definite residual soft tissue mass at previously seen metastasis at posterior LEFT seventh rib.  CT ABDOMEN AND PELVIS FINDINGS  Calcified gallstones in gallbladder.  Liver, spleen, pancreas, kidneys, and adrenal glands normal.  Scattered atherosclerotic calcifications without aortic aneurysm.  Stomach incompletely distended with suboptimal assessment of wall thickness.  Large and small bowel loops normal appearance.  Normal appendix.  Mild prostatic enlargement.  Bladder and ureters unremarkable.  LEFT inguinal hernia containing fat.  No mass, adenopathy, free fluid, free air or inflammatory process.  Small umbilical hernia containing fat.  Questionable subtle lucent  lesion within the L4 vertebral body, image 87, unchanged ; no FDG accumulation at this site on to prior PET-CT exams.  IMPRESSION: Stable postsurgical and postradiation therapy changes of RIGHT hemi thorax.  Resolution of soft tissue mass previously seen at the posterior LEFT seventh rib.  Persistent RIGHT pleural effusion.  No new intrathoracic abnormalities.  Cholelithiasis.  Small LEFT inguinal and umbilical hernias containing fat.  Prostatic enlargement.  No new intra-abdominal or intrapelvic abnormalities.  Question subtle lucent lesion within the L4 vertebra, unchanged since 04/25/2014 and 12/27/2013, nonspecific, could represent a subtle vertebral hemangioma; the lack of FDG accumulation at this site on to prior PET-CT studies suggests this is not in metastatic lesion.  Upper normal size of ascending thoracic aorta; recommend annual imaging followup by CTA or MRA. This recommendation follows 2010 ACCF/AHA/AATS/ACR/ASA/SCA/SCAI/SIR/STS/SVM Guidelines for the Diagnosis and Management of Patients with Thoracic Aortic Disease. Circulation. 2010; 121: [id number]   Electronically Signed   By: Lavonia Dana M.D.   On: 06/16/2014 09:21    ASSESSMENT/PLAN:  This is a very pleasant 74 years old white male recently diagnosed with a stage IIIA non-small cell lung cancer. He completed a course of concurrent chemoradiation with weekly carboplatin and paclitaxel status post 5 cycle, this was followed by right upper lobectomy with lymph node dissection under the care of Dr. Servando Snare. The final pathology showed positive bronchial resection margin with a pathologic stage ypT3, ypN0. The patient underwent a course of radiotherapy to the positive resection margin but unfortunately the restaging CT scan of the chest showed evidence for disease progression with new lytic rib lesion as well as enlarged mediastinal lymphadenopathy. The MRI of the brain was negative for brain metastasis. He is status post 3 cycles of systemic  chemotherapy with carboplatin for AUC of 5 on day 1 and Abraxane 100 mg/M2 on days 1, 8 and 15 every 3 weeks. However his restaging CT scan again showed evidence for disease progression. He is now being treated with immunotherapy with Nivolumab. He is status post 12 cycles. The most recent restaging CT scan shows an resolution of the soft tissue mass of his previously seen at the posterior left seventh rib there were stable postsurgical and postradiation therapy changes of the right hemithorax, no new intrathoracic abnormalities. There were no new intra-abdominal or intrapelvic abnormalities. l Patient was discussed with and also seen by Dr. Julien Nordmann.  Overall he's had significant improvement in his disease and we will continue with cycle #13 today as scheduled.he is advised to use on lotion for his itching  skin on his back.  He will follow-up in 2 weeks prior to the start of cycle #14.     Carlton Adam, PA-C 06/24/2014  ADDENDUM: Hematology/Oncology Attending: I had a face to face encounter with the patient. I recommended his care plan. This is a very pleasant 74 years old white male with metastatic non-small cell lung cancer, squamous cell carcinoma is currently undergoing immunotherapy with Nivolumab 3 MG/KG every 2 weeks. He is status post 12 cycles of treatment. The patient is tolerating his treatment fairly well with no significant adverse effects. He denied having any significant nausea or vomiting, no fever or chills. No significant diarrhea. The recent CT scan of the chest, abdomen and pelvis showed no evidence for disease progression. I discussed the scan results with the patient today. I recommended for him to continue his current treatment with immunotherapy as a scheduled. He will receive cycle #13 today. He would come back for follow-up visit in 2 weeks for reevaluation before the next cycle of his chemotherapy. He was advised to call immediately if he has any concerning symptoms in the  interval.  Disclaimer: This note was dictated with voice recognition software. Similar sounding words can inadvertently be transcribed and may be missed upon review. [name]., MD 07/01/2014

## 2014-06-24 NOTE — Patient Instructions (Signed)

## 2014-06-24 NOTE — Patient Instructions (Signed)
[facility] Discharge Instructions for Patients Receiving Chemotherapy  Today you received the following chemotherapy agents Nivolumab.  To help prevent nausea and vomiting after your treatment, we encourage you to take your nausea medication as prescribed.   If you develop nausea and vomiting that is not controlled by your nausea medication, call the clinic.   BELOW ARE SYMPTOMS THAT SHOULD BE REPORTED IMMEDIATELY:  *FEVER GREATER THAN 100.5 F  *CHILLS WITH OR WITHOUT FEVER  NAUSEA AND VOMITING THAT IS NOT CONTROLLED WITH YOUR NAUSEA MEDICATION  *UNUSUAL SHORTNESS OF BREATH  *UNUSUAL BRUISING OR BLEEDING  TENDERNESS IN MOUTH AND THROAT WITH OR WITHOUT PRESENCE OF ULCERS  *URINARY PROBLEMS  *BOWEL PROBLEMS  UNUSUAL RASH Items with * indicate a potential emergency and should be followed up as soon as possible.  Feel free to call the clinic you have any questions or concerns. The clinic phone number is [phone].  Please show the CHEMO ALERT CARD at check-in to the Emergency Department and triage nurse.   

## 2014-06-28 NOTE — Patient Instructions (Signed)
Your CT scan showed resolution of the soft tissue mass at your posterior left 7th rib. Follow up in 2 weeks

## 2014-07-08 ENCOUNTER — Ambulatory Visit: Payer: Medicare Other

## 2014-07-08 ENCOUNTER — Ambulatory Visit (HOSPITAL_BASED_OUTPATIENT_CLINIC_OR_DEPARTMENT_OTHER): Payer: Medicare Other | Admitting: Physician Assistant

## 2014-07-08 ENCOUNTER — Ambulatory Visit (HOSPITAL_BASED_OUTPATIENT_CLINIC_OR_DEPARTMENT_OTHER): Payer: Medicare Other

## 2014-07-08 ENCOUNTER — Other Ambulatory Visit (HOSPITAL_BASED_OUTPATIENT_CLINIC_OR_DEPARTMENT_OTHER): Payer: Medicare Other

## 2014-07-08 ENCOUNTER — Encounter: Payer: Self-pay | Admitting: Physician Assistant

## 2014-07-08 VITALS — BP 102/56 | HR 88 | Temp 97.4°F | Wt 204.7 lb

## 2014-07-08 DIAGNOSIS — C3411 Malignant neoplasm of upper lobe, right bronchus or lung: Secondary | ICD-10-CM

## 2014-07-08 DIAGNOSIS — Z95828 Presence of other vascular implants and grafts: Secondary | ICD-10-CM

## 2014-07-08 DIAGNOSIS — Z79899 Other long term (current) drug therapy: Secondary | ICD-10-CM

## 2014-07-08 DIAGNOSIS — C7951 Secondary malignant neoplasm of bone: Secondary | ICD-10-CM

## 2014-07-08 DIAGNOSIS — Z5112 Encounter for antineoplastic immunotherapy: Secondary | ICD-10-CM

## 2014-07-08 DIAGNOSIS — C3492 Malignant neoplasm of unspecified part of left bronchus or lung: Secondary | ICD-10-CM

## 2014-07-08 LAB — CBC WITH DIFFERENTIAL/PLATELET
BASO%: 0.2 % (ref 0.0–2.0)
BASOS ABS: 0 10*3/uL (ref 0.0–0.1)
EOS ABS: 0.1 10*3/uL (ref 0.0–0.5)
EOS%: 2.1 % (ref 0.0–7.0)
HCT: 37.7 % — ABNORMAL LOW (ref 38.4–49.9)
HEMOGLOBIN: 12.3 g/dL — AB (ref 13.0–17.1)
LYMPH%: 16.9 % (ref 14.0–49.0)
MCH: 33.2 pg (ref 27.2–33.4)
MCHC: 32.6 g/dL (ref 32.0–36.0)
MCV: 101.6 fL — AB (ref 79.3–98.0)
MONO#: 0.4 10*3/uL (ref 0.1–0.9)
MONO%: 10.2 % (ref 0.0–14.0)
NEUT#: 3 10*3/uL (ref 1.5–6.5)
NEUT%: 70.6 % (ref 39.0–75.0)
Platelets: 184 10*3/uL (ref 140–400)
RBC: 3.71 10*6/uL — ABNORMAL LOW (ref 4.20–5.82)
RDW: 14 % (ref 11.0–14.6)
WBC: 4.2 10*3/uL (ref 4.0–10.3)
lymph#: 0.7 10*3/uL — ABNORMAL LOW (ref 0.9–3.3)

## 2014-07-08 LAB — COMPREHENSIVE METABOLIC PANEL (CC13)
ALT: 18 U/L (ref 0–55)
ANION GAP: 8 meq/L (ref 3–11)
AST: 18 U/L (ref 5–34)
Albumin: 3.4 g/dL — ABNORMAL LOW (ref 3.5–5.0)
Alkaline Phosphatase: 94 U/L (ref 40–150)
BILIRUBIN TOTAL: 0.44 mg/dL (ref 0.20–1.20)
BUN: 19.2 mg/dL (ref 7.0–26.0)
CHLORIDE: 105 meq/L (ref 98–109)
CO2: 26 mEq/L (ref 22–29)
Calcium: 8.8 mg/dL (ref 8.4–10.4)
Creatinine: 0.9 mg/dL (ref 0.7–1.3)
EGFR: 81 mL/min/{1.73_m2} — AB (ref >90)
Glucose: 131 mg/dl (ref 70–140)
Potassium: 4.1 mEq/L (ref 3.5–5.1)
Sodium: 139 mEq/L (ref 136–145)
Total Protein: 6.5 g/dL (ref 6.4–8.3)

## 2014-07-08 LAB — TSH CHCC: TSH: 2.594 m(IU)/L (ref 0.320–4.118)

## 2014-07-08 MED ORDER — SODIUM CHLORIDE 0.9 % IJ SOLN
10.0000 mL | INTRAMUSCULAR | Status: DC | PRN
  Administered 2014-07-08: 10 mL
  Filled 2014-07-08: qty 10

## 2014-07-08 MED ORDER — SODIUM CHLORIDE 0.9 % IV SOLN
3.0000 mg/kg | Freq: Once | INTRAVENOUS | Status: AC
  Administered 2014-07-08: 280 mg via INTRAVENOUS
  Filled 2014-07-08: qty 28

## 2014-07-08 MED ORDER — OXYCODONE-ACETAMINOPHEN 5-325 MG PO TABS: 1.0000 | ORAL_TABLET | Freq: Four times a day (QID) | ORAL | Status: DC | PRN

## 2014-07-08 MED ORDER — HEPARIN SOD (PORK) LOCK FLUSH 100 UNIT/ML IV SOLN
500.0000 [IU] | Freq: Once | INTRAVENOUS | Status: AC | PRN
  Administered 2014-07-08: 500 [IU]
  Filled 2014-07-08: qty 5

## 2014-07-08 MED ORDER — SODIUM CHLORIDE 0.9 % IV SOLN: Freq: Once | INTRAVENOUS | Status: DC

## 2014-07-08 MED ORDER — SODIUM CHLORIDE 0.9 % IJ SOLN
10.0000 mL | INTRAMUSCULAR | Status: DC | PRN
  Administered 2014-07-08: 10 mL via INTRAVENOUS
  Filled 2014-07-08: qty 10

## 2014-07-08 NOTE — Progress Notes (Signed)
[place]  [phone] [phone]  SHARED VISIT PROGRESS NOTE  [name], MD 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   Primary site: Lung (Right)   Staging method: AJCC 7th Edition   Clinical: Stage IIIA (T3, N2, M0) signed by Curt Bears, MD on 05/09/2013  2:56 PM   Summary: Stage IIIA (T3, N2, M0)  PRIOR THERAPY:  1) Concurrent chemoradiation with weekly carboplatin for an AUC of 2 and paclitaxel 45 mg per meter squared. Status post 5 cycles. 2) Bronchoscopy, mediastinoscopy, right posterior lateral thoracotomy with right upper lobectomy, lymph node dissection,  and posterior approach to en bloc chest wall resection, ribs 2, 3, and 4, with positive bronchial resection margin. 3) palliative radiotherapy to the best of resection margin under the care of Dr. Tammi Klippel completed on 10/14/2013. 4) systemic chemotherapy with carboplatin for an AUC of 5 given on day 1 and Abraxane 100 mg/m given days 1, 8 and 15 every 3 weeks status post 3 cycles discontinued 12/31/2013 secondary to disease progression  CURRENT THERAPY: Immunotherapy with Nivolumab 3 mg/kg given every 2 weeks.  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    INTERVAL HISTORY: Terry Robles 74 y.o. male returns for followup visit. Overall is tolerating his treatment with immunotherapy with  Nivolumab without significant difficulty. He denies any change in his baseline shortness of breath and continues to feel as if his breathing is slightly better. He denies diarrhea. .He reports the skin rash on his back is stable. This is likely from previous radiation treatment.  He states the area is s still "itchy", denies any other skin eruptions elsewhere on his body.  He did not try the Sarna lotion because he forgot the name of it. He does report some difficulty sleeping. He will only get may be 3-4 hours at night and does report napping some throughout the day which perpetuates his situation. He continues to have some residual numbness/tingling in his toes as well as his right hand from previous chemotherapy. This remains stable.  He continues to have a cough which has been long-standing, primarily dry although occasionally productive of clear secretions. This is well managed with Delsym cough syrup during the day and Hycodan cough syrup at night. He continues to complain of pain in both hips and his right shoulder since the start of treatment. His been taking Percocet to 3 times a day with reasonable relief. He requests refill prescriptions for his Percocet.  He voiced no other specific complaints today.  He has no hemoptysis. He denied having any significant weight loss or night sweats. He has no nausea or vomiting. The bruising on his right wrist has  resolved. He is here to proceed with cycle #14 of his immunotherapy with Nivolumab.     MEDICAL HISTORY: Past Medical History  Diagnosis Date  . Hypercholesteremia 05/14/2011  . BPH (benign prostatic hyperplasia) 05/14/2011    takes Uroxatral daily  . GERD (gastroesophageal reflux disease)   . Shortness of  breath     weather related  . Pneumonia 1965  . Headache(784.0)     occasionally  . Joint pain   . Maintenance chemotherapy   . Urinary frequency   . History of shingles   . Anemia   . Acquired thrombocytopenia   . 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 Cancer     squamous cell carcinoma right superior sulcus  . Lung cancer     right lung    ALLERGIES:  has No Known Allergies.  MEDICATIONS:  Current Outpatient Prescriptions  Medication Sig Dispense Refill  . alfuzosin (UROXATRAL) 10 MG 24 hr tablet Take 10 mg by mouth daily with breakfast.    . cycloSPORINE (RESTASIS) 0.05 % ophthalmic emulsion 1 drop 2 (two) times daily.    Marland Kitchen docusate sodium (COLACE) 100 MG capsule Take 100 mg by mouth daily.    . feeding supplement, RESOURCE BREEZE, (RESOURCE BREEZE) LIQD Take 1 Container by mouth daily at 3 pm.  0  . HYDROcodone-homatropine (HYCODAN) 5-1.5 MG/5ML syrup Take 5 mLs by mouth every 6 (six) hours as needed for cough. 240 mL 0  . Multiple Vitamin (MULITIVITAMIN WITH MINERALS) TABS Take 1 tablet by mouth daily.    Marland Kitchen oxyCODONE-acetaminophen (PERCOCET/ROXICET) 5-325 MG per tablet Take 1 tablet by mouth every 6 (six) hours as needed for severe pain. 90 tablet 0  . polyethylene glycol (MIRALAX / GLYCOLAX) packet Take 17 g by mouth daily.    . prochlorperazine (COMPAZINE) 5 MG tablet Take 1 tablet (5 mg total) by mouth every 6 (six) hours as needed for nausea or vomiting. 30 tablet 2   No current facility-administered medications for this visit.   Facility-Administered Medications Ordered in Other Visits  Medication Dose Route Frequency Provider Last Rate Last Dose  . 0.9 %  sodium chloride infusion   Intravenous Once Curt Bears, MD      . heparin lock flush 100 unit/mL  500 Units Intracatheter Once PRN Curt Bears, MD      . nivolumab (OPDIVO) 280 mg in sodium chloride 0.9 % 100 mL chemo infusion  3 mg/kg (Treatment Plan Actual) Intravenous Once Curt Bears,  MD 128 mL/hr at 07/08/14 1010 280 mg at 07/08/14 1010  . sodium chloride 0.9 % injection 10 mL  10 mL Intracatheter PRN Curt Bears, MD        SURGICAL HISTORY:  Past Surgical History  Procedure Laterality Date  . Other surgical history      benign tumor removed from left leg several years ago  . Other surgical history      had melanoma/squamous cell removed previously per patient  . Tumor excision      Left knee  . Colonoscopy w/ biopsies and polypectomy    . Video bronchoscopy N/A 07/30/2013    Procedure: VIDEO BRONCHOSCOPY;  Surgeon: Grace Isaac, MD;  Location: [place];  Service: Thoracic;  Laterality: N/A;  . Mediastinoscopy N/A 07/30/2013    Procedure: MEDIASTINOSCOPY;  Surgeon: Grace Isaac, MD;  Location: [place];  Service: Thoracic;  Laterality: N/A;  . Video assisted thoracoscopy (vats)/wedge resection Right 07/30/2013    Procedure:  VIDEO ASSISTED THORACOSCOPY;  Surgeon: Grace Isaac, MD;  Location: [place];  Service: Thoracic;  Laterality: Right;  . Thoracotomy  07/30/2013    Procedure: THORACOTOMY FOR CHEST WALL RESECTION, RIGHT UPPER LOBECTOMY;  Surgeon: Grace Isaac, MD;  Location: [place];  Service: Thoracic;;  . Adenoidectomy  as a child  . Tonsillectomy    . Portacath placement Left 10/30/2013    Procedure: INSERTION PORT-A-CATH WITH ULTRASOUND GUIDANCE AND FLUORO;  Surgeon: Grace Isaac, MD;  Location: [place];  Service: Thoracic;  Laterality: Left;    REVIEW OF SYSTEMS:  Constitutional: positive for fatigue Eyes: negative Ears, nose, mouth, throat, and face: negative Respiratory: positive for cough and dyspnea on exertion Cardiovascular: negative Gastrointestinal: negative Genitourinary:negative Integument/breast: positive for pruritus, rash and likely related to previous radiation treatment Hematologic/lymphatic: negative Musculoskeletal:positive for arthralgias Neurological: positive for paresthesia Behavioral/Psych: negative Endocrine:  negative Allergic/Immunologic: negative   PHYSICAL EXAMINATION: General appearance: alert, cooperative, appears stated age and no distress Head: Normocephalic, without obvious abnormality, atraumatic Neck: no adenopathy, no carotid bruit, no JVD, supple, symmetrical, trachea midline and thyroid not enlarged, symmetric, no tenderness/mass/nodules Lymph nodes: Cervical, supraclavicular, and axillary nodes normal. Resp: clear to auscultation bilaterally Back: symmetric, no curvature. ROM normal. No CVA tenderness. Cardio: regular rate and rhythm, S1, S2 normal, no murmur, click, rub or gallop GI: soft, non-tender; bowel sounds normal; no masses,  no organomegaly Extremities: extremities normal, atraumatic, no cyanosis or edema and The left forearm and hand are cooler to touch than the right Neurologic: Alert and oriented X 3, normal strength and tone. Normal symmetric reflexes. Normal coordination and gait Skin: Left anterior chest Port-A-Cath incisions are well healed, no evidence of infection. Examination of the back reveals generalized dry skin with a few scattered erythematous macular lesions.  No other skin eruptions noted elsewhere on the body.  ECOG PERFORMANCE STATUS: 1 - Symptomatic but completely ambulatory  There were no vitals taken for this visit.  LABORATORY DATA: Lab Results  Component Value Date   WBC 4.2 07/08/2014   HGB 12.3* 07/08/2014   HCT 37.7* 07/08/2014   MCV 101.6* 07/08/2014   PLT 184 07/08/2014      Chemistry      Component Value Date/Time   NA 139 07/08/2014 0820   NA 140 12/19/2013 0405   K 4.1 07/08/2014 0820   K 4.2 12/19/2013 0405   CL 104 12/19/2013 0405   CO2 26 07/08/2014 0820   CO2 26 12/19/2013 0405   BUN 19.2 07/08/2014 0820   BUN 14 12/19/2013 0405   CREATININE 0.9 07/08/2014 0820   CREATININE 0.87 12/19/2013 0405      Component Value Date/Time   CALCIUM 8.8 07/08/2014 0820   CALCIUM 9.0 12/19/2013 0405   ALKPHOS 94 07/08/2014 0820    ALKPHOS 126* 12/18/2013 1610   AST 18 07/08/2014 0820   AST 17 12/18/2013 1610   ALT 18 07/08/2014 0820   ALT 16 12/18/2013 1610   BILITOT 0.44 07/08/2014 0820   BILITOT 0.4 12/18/2013 1610       RADIOGRAPHIC STUDIES: Ct Chest W Contrast  06/16/2014   CLINICAL DATA:  T3N2M0 squamous cell carcinoma RIGHT upper lobe post RIGHT upper lobectomy, chemotherapy, and radiation therapy, restaging, former smoker,, history GERD  EXAM: CT CHEST, ABDOMEN, AND PELVIS WITH CONTRAST  TECHNIQUE: Multidetector CT imaging of the chest, abdomen and pelvis was performed following the standard protocol during bolus administration of intravenous contrast. Sagittal and coronal MPR images reconstructed from axial data set.  CONTRAST:  141mL OMNIPAQUE IOHEXOL 300 MG/ML SOLN IV. Dilute oral contrast.  COMPARISON:  04/25/2014 and 12/27/2013 ; correlation PET-CT exams 10/18/2013, 04/25/2013  FINDINGS: CT CHEST FINDINGS  LEFT subclavian Port-A-Cath, tip in SVC.  Scattered atherosclerotic calcifications aorta and coronary arteries.  Upper normal size ascending thoracic aorta 3.8 x 3.8 cm image 28.  Ovoid subcutaneous lesion with dependent calcification anterolateral LEFT chest wall 5.5 x 1.8 x 4.1 cm again seen.  Post RIGHT upper lobectomy with prior rib resections and thoracoplasty.  High LEFT superior mediastinal lymph node 12 mm short axis image 11 unchanged.  Precarinal lymph node 5 mm short axis image 21 previously 9 mm.  No additional thoracic adenopathy.  Moderate-sized RIGHT pleural effusion, partially loculated and anterior lung base.  Scarring and volume loss superior segment RIGHT lower lobe unchanged.  Interstitial changes at the lateral RIGHT lung base unchanged.  Paramediastinal radiation fibrosis changes stable.  No acute infiltrate, LEFT pleural effusion, pneumothorax, or new pulmonary mass/nodule.  No definite residual soft tissue mass at previously seen metastasis at posterior LEFT seventh rib.  CT ABDOMEN AND  PELVIS FINDINGS  Calcified gallstones in gallbladder.  Liver, spleen, pancreas, kidneys, and adrenal glands normal.  Scattered atherosclerotic calcifications without aortic aneurysm.  Stomach incompletely distended with suboptimal assessment of wall thickness.  Large and small bowel loops normal appearance.  Normal appendix.  Mild prostatic enlargement.  Bladder and ureters unremarkable.  LEFT inguinal hernia containing fat.  No mass, adenopathy, free fluid, free air or inflammatory process.  Small umbilical hernia containing fat.  Questionable subtle lucent lesion within the L4 vertebral body, image 87, unchanged ; no FDG accumulation at this site on to prior PET-CT exams.  IMPRESSION: Stable postsurgical and postradiation therapy changes of RIGHT hemi thorax.  Resolution of soft tissue mass previously seen at the posterior LEFT seventh rib.  Persistent RIGHT pleural effusion.  No new intrathoracic abnormalities.  Cholelithiasis.  Small LEFT inguinal and umbilical hernias containing fat.  Prostatic enlargement.  No new intra-abdominal or intrapelvic abnormalities.  Question subtle lucent lesion within the L4 vertebra, unchanged since 04/25/2014 and 12/27/2013, nonspecific, could represent a subtle vertebral hemangioma; the lack of FDG accumulation at this site on to prior PET-CT studies suggests this is not in metastatic lesion.  Upper normal size of ascending thoracic aorta; recommend annual imaging followup by CTA or MRA. This recommendation follows 2010 ACCF/AHA/AATS/ACR/ASA/SCA/SCAI/SIR/STS/SVM Guidelines for the Diagnosis and Management of Patients with Thoracic Aortic Disease. Circulation. 2010; 121: Y650-P546   Electronically Signed   By: Lavonia Dana M.D.   On: 06/16/2014 09:21   Ct Abdomen Pelvis W Contrast  06/16/2014   CLINICAL DATA:  T3N2M0 squamous cell carcinoma RIGHT upper lobe post RIGHT upper lobectomy, chemotherapy, and radiation therapy, restaging, former smoker,, history GERD  EXAM: CT CHEST,  ABDOMEN, AND PELVIS WITH CONTRAST  TECHNIQUE: Multidetector CT imaging of the chest, abdomen and pelvis was performed following the standard protocol during bolus administration of intravenous contrast. Sagittal and coronal MPR images reconstructed from axial data set.  CONTRAST:  132mL OMNIPAQUE IOHEXOL 300 MG/ML SOLN IV. Dilute oral contrast.  COMPARISON:  04/25/2014 and 12/27/2013 ; correlation PET-CT exams 10/18/2013, 04/25/2013  FINDINGS: CT CHEST FINDINGS  LEFT subclavian Port-A-Cath, tip in SVC.  Scattered atherosclerotic calcifications aorta and coronary arteries.  Upper normal size ascending thoracic aorta 3.8 x 3.8 cm image 28.  Ovoid subcutaneous lesion with dependent calcification anterolateral LEFT chest wall 5.5 x 1.8 x 4.1 cm again seen.  Post RIGHT upper  lobectomy with prior rib resections and thoracoplasty.  High LEFT superior mediastinal lymph node 12 mm short axis image 11 unchanged.  Precarinal lymph node 5 mm short axis image 21 previously 9 mm.  No additional thoracic adenopathy.  Moderate-sized RIGHT pleural effusion, partially loculated and anterior lung base.  Scarring and volume loss superior segment RIGHT lower lobe unchanged.  Interstitial changes at the lateral RIGHT lung base unchanged.  Paramediastinal radiation fibrosis changes stable.  No acute infiltrate, LEFT pleural effusion, pneumothorax, or new pulmonary mass/nodule.  No definite residual soft tissue mass at previously seen metastasis at posterior LEFT seventh rib.  CT ABDOMEN AND PELVIS FINDINGS  Calcified gallstones in gallbladder.  Liver, spleen, pancreas, kidneys, and adrenal glands normal.  Scattered atherosclerotic calcifications without aortic aneurysm.  Stomach incompletely distended with suboptimal assessment of wall thickness.  Large and small bowel loops normal appearance.  Normal appendix.  Mild prostatic enlargement.  Bladder and ureters unremarkable.  LEFT inguinal hernia containing fat.  No mass, adenopathy, free  fluid, free air or inflammatory process.  Small umbilical hernia containing fat.  Questionable subtle lucent lesion within the L4 vertebral body, image 87, unchanged ; no FDG accumulation at this site on to prior PET-CT exams.  IMPRESSION: Stable postsurgical and postradiation therapy changes of RIGHT hemi thorax.  Resolution of soft tissue mass previously seen at the posterior LEFT seventh rib.  Persistent RIGHT pleural effusion.  No new intrathoracic abnormalities.  Cholelithiasis.  Small LEFT inguinal and umbilical hernias containing fat.  Prostatic enlargement.  No new intra-abdominal or intrapelvic abnormalities.  Question subtle lucent lesion within the L4 vertebra, unchanged since 04/25/2014 and 12/27/2013, nonspecific, could represent a subtle vertebral hemangioma; the lack of FDG accumulation at this site on to prior PET-CT studies suggests this is not in metastatic lesion.  Upper normal size of ascending thoracic aorta; recommend annual imaging followup by CTA or MRA. This recommendation follows 2010 ACCF/AHA/AATS/ACR/ASA/SCA/SCAI/SIR/STS/SVM Guidelines for the Diagnosis and Management of Patients with Thoracic Aortic Disease. Circulation. 2010; 121: [id number]   Electronically Signed   By: Lavonia Dana M.D.   On: 06/16/2014 09:21    ASSESSMENT/PLAN:  This is a very pleasant 74 years old white male recently diagnosed with a stage IIIA non-small cell lung cancer. He completed a course of concurrent chemoradiation with weekly carboplatin and paclitaxel status post 5 cycle, this was followed by right upper lobectomy with lymph node dissection under the care of Dr. Servando Snare. The final pathology showed positive bronchial resection margin with a pathologic stage ypT3, ypN0. The patient underwent a course of radiotherapy to the positive resection margin but unfortunately the restaging CT scan of the chest showed evidence for disease progression with new lytic rib lesion as well as enlarged mediastinal  lymphadenopathy. The MRI of the brain was negative for brain metastasis. He is status post 3 cycles of systemic chemotherapy with carboplatin for AUC of 5 on day 1 and Abraxane 100 mg/M2 on days 1, 8 and 15 every 3 weeks. However his restaging CT scan again showed evidence for disease progression. He is now being treated with immunotherapy with Nivolumab. He is status post 13 cycles. The most recent restaging CT scan shows a resolution of the soft tissue mass that was previously seen at the posterior left seventh rib, there were stable postsurgical and postradiation therapy changes of the right hemithorax, no new intrathoracic abnormalities. There were no new intra-abdominal or intrapelvic abnormalities.  Patient was discussed with and also seen by Dr. Julien Nordmann.  Overall  he's had significant improvement in his disease and we will continue with cycle #14 today as scheduled. Marland KitchenHe is again advised to use Sarna lotion for his itching skin on his back. He was given a refill prescription for his Percocet tablets.  He will follow-up in 2 weeks prior to the start of cycle #15.     Carlton Adam, PA-C 07/08/2014  ADDENDUM: Hematology/Oncology Attending: I had a face to face encounter with the patient today. I recommended his care plan. This is a very pleasant 74 years old white male with recurrent non-small cell lung cancer, squamous cell carcinoma. He is currently undergoing treatment with immunotherapy with Nivolumab status post 13 cycles. He is tolerating his treatment fairly well with no significant adverse effects. I recommended for the patient to proceed with cycle #14 today as a scheduled. He will come back for follow-up visit in 2 weeks with the next cycle of his treatment. The patient was advised to call immediately if he has any concerning symptoms in the interval.  Disclaimer: This note was dictated with voice recognition software. Similar sounding words can inadvertently be transcribed and may be  missed upon review. [name]., MD 07/08/2014

## 2014-07-08 NOTE — Patient Instructions (Signed)

## 2014-07-08 NOTE — Patient Instructions (Signed)
[facility] Discharge Instructions for Patients Receiving Chemotherapy  Today you received the following chemotherapy agents: Nivolumab  To help prevent nausea and vomiting after your treatment, we encourage you to take your nausea medication as prescribed.   If you develop nausea and vomiting that is not controlled by your nausea medication, call the clinic.   BELOW ARE SYMPTOMS THAT SHOULD BE REPORTED IMMEDIATELY:  *FEVER GREATER THAN 100.5 F  *CHILLS WITH OR WITHOUT FEVER  NAUSEA AND VOMITING THAT IS NOT CONTROLLED WITH YOUR NAUSEA MEDICATION  *UNUSUAL SHORTNESS OF BREATH  *UNUSUAL BRUISING OR BLEEDING  TENDERNESS IN MOUTH AND THROAT WITH OR WITHOUT PRESENCE OF ULCERS  *URINARY PROBLEMS  *BOWEL PROBLEMS  UNUSUAL RASH Items with * indicate a potential emergency and should be followed up as soon as possible.  Feel free to call the clinic you have any questions or concerns. The clinic phone number is [phone].  Please show the [facility] at check-in to the Emergency Department and triage nurse.      Nivolumab injection What is this medicine? NIVOLUMAB (nye VOL ue mab) is used to treat certain types of melanoma and lung cancer. This medicine may be used for other purposes; ask your health care provider or pharmacist if you have questions. COMMON BRAND NAME(S): Opdivo What should I tell my health care provider before I take this medicine? They need to know if you have any of these conditions: -eye disease, vision problems -history of pancreatitis -immune system problems -inflammatory bowel disease -kidney disease -liver disease -lung disease -lupus -myasthenia gravis -multiple sclerosis -organ transplant -stomach or intestine problems -thyroid disease -tingling of the fingers or toes, or other nerve disorder -an unusual or allergic reaction to nivolumab, other medicines, foods, dyes, or preservatives -pregnant or trying to get  pregnant -breast-feeding How should I use this medicine? This medicine is for infusion into a vein. It is given by a health care professional in a hospital or clinic setting. A special MedGuide will be given to you before each treatment. Be sure to read this information carefully each time. Talk to your pediatrician regarding the use of this medicine in children. Special care may be needed. Overdosage: If you think you've taken too much of this medicine contact a poison control center or emergency room at once. Overdosage: If you think you have taken too much of this medicine contact a poison control center or emergency room at once. NOTE: This medicine is only for you. Do not share this medicine with others. What if I miss a dose? It is important not to miss your dose. Call your doctor or health care professional if you are unable to keep an appointment. What may interact with this medicine? Interactions have not been studied. This list may not describe all possible interactions. Give your health care provider a list of all the medicines, herbs, non-prescription drugs, or dietary supplements you use. Also tell them if you smoke, drink alcohol, or use illegal drugs. Some items may interact with your medicine. What should I watch for while using this medicine? Tell your doctor or healthcare professional if your symptoms do not start to get better or if they get worse. Your condition will be monitored carefully while you are receiving this medicine. You may need blood work done while you are taking this medicine. What side effects may I notice from receiving this medicine? Side effects that you should report to your doctor or health care professional as soon as possible: -  allergic reactions like skin rash, itching or hives, swelling of the face, lips, or tongue -black, tarry stools -bloody or watery diarrhea -changes in vision -chills -cough -depressed mood -eye pain -feeling  anxious -fever -general ill feeling or flu-like symptoms -hair loss -loss of appetite -low blood counts - this medicine may decrease the number of white blood cells, red blood cells and platelets. You may be at increased risk for infections and bleeding -pain, tingling, numbness in the hands or feet -redness, blistering, peeling or loosening of the skin, including inside the mouth -red pinpoint spots on skin -signs of decreased platelets or bleeding - bruising, pinpoint red spots on the skin, black, tarry stools, blood in the urine -signs of decreased red blood cells - unusually weak or tired, feeling faint or lightheaded, falls -signs of infection - fever or chills, cough, sore throat, pain or trouble passing urine -signs and symptoms of a dangerous change in heartbeat or heart rhythm like chest pain; dizziness; fast or irregular heartbeat; palpitations; feeling faint or lightheaded, falls; breathing problems -signs and symptoms of high blood sugar such as dizziness; dry mouth; dry skin; fruity breath; nausea; stomach pain; increased hunger or thirst; increased urination -signs and symptoms of kidney injury like trouble passing urine or change in the amount of urine -signs and symptoms of liver injury like dark yellow or brown urine; general ill feeling or flu-like symptoms; light-colored stools; loss of appetite; nausea; right upper belly pain; unusually weak or tired; yellowing of the eyes or skin -signs and symptoms of increased potassium like muscle weakness; chest pain; or fast, irregular heartbeat -signs and symptoms of low potassium like muscle cramps or muscle pain; chest pain; dizziness; feeling faint or lightheaded, falls; palpitations; breathing problems; or fast, irregular heartbeat -swelling of the ankles, feet, hands -weight gainSide effects that usually do not require medical attention (report to your doctor or health care professional if they continue or are  bothersome): -constipation -general ill feeling or flu-like symptoms -hair loss -loss of appetite -nausea, vomiting This list may not describe all possible side effects. Call your doctor for medical advice about side effects. You may report side effects to FDA at 1-800-FDA-1088. Where should I keep my medicine? This drug is given in a hospital or clinic and will not be stored at home. NOTE: This sheet is a summary. It may not cover all possible information. If you have questions about this medicine, talk to your doctor, pharmacist, or health care provider.  2015, Elsevier/Gold Standard. (2013-06-03 13:18:19)

## 2014-07-08 NOTE — Patient Instructions (Signed)
Follow pu in 2 weeks, prior to your next cycle of immunotherapy

## 2014-07-15 ENCOUNTER — Encounter: Payer: Self-pay | Admitting: Physician Assistant

## 2014-07-19 ENCOUNTER — Encounter: Payer: Self-pay | Admitting: Physician Assistant

## 2014-07-22 ENCOUNTER — Ambulatory Visit (HOSPITAL_BASED_OUTPATIENT_CLINIC_OR_DEPARTMENT_OTHER): Payer: Medicare Other

## 2014-07-22 ENCOUNTER — Ambulatory Visit (HOSPITAL_BASED_OUTPATIENT_CLINIC_OR_DEPARTMENT_OTHER): Payer: Medicare Other | Admitting: Physician Assistant

## 2014-07-22 ENCOUNTER — Other Ambulatory Visit (HOSPITAL_BASED_OUTPATIENT_CLINIC_OR_DEPARTMENT_OTHER): Payer: Medicare Other

## 2014-07-22 ENCOUNTER — Encounter: Payer: Self-pay | Admitting: Physician Assistant

## 2014-07-22 ENCOUNTER — Ambulatory Visit: Payer: Medicare Other

## 2014-07-22 ENCOUNTER — Telehealth: Payer: Self-pay | Admitting: Internal Medicine

## 2014-07-22 VITALS — BP 120/65 | HR 73 | Temp 98.4°F | Resp 18 | Ht 74.0 in | Wt 206.1 lb

## 2014-07-22 DIAGNOSIS — L299 Pruritus, unspecified: Secondary | ICD-10-CM | POA: Diagnosis not present

## 2014-07-22 DIAGNOSIS — C3411 Malignant neoplasm of upper lobe, right bronchus or lung: Secondary | ICD-10-CM

## 2014-07-22 DIAGNOSIS — Z95828 Presence of other vascular implants and grafts: Secondary | ICD-10-CM

## 2014-07-22 DIAGNOSIS — C7951 Secondary malignant neoplasm of bone: Secondary | ICD-10-CM

## 2014-07-22 DIAGNOSIS — C3492 Malignant neoplasm of unspecified part of left bronchus or lung: Secondary | ICD-10-CM

## 2014-07-22 DIAGNOSIS — Z5112 Encounter for antineoplastic immunotherapy: Secondary | ICD-10-CM

## 2014-07-22 LAB — COMPREHENSIVE METABOLIC PANEL (CC13)
ALK PHOS: 87 U/L (ref 40–150)
ALT: 13 U/L (ref 0–55)
AST: 14 U/L (ref 5–34)
Albumin: 3.3 g/dL — ABNORMAL LOW (ref 3.5–5.0)
Anion Gap: 9 mEq/L (ref 3–11)
BUN: 15.8 mg/dL (ref 7.0–26.0)
CALCIUM: 8.8 mg/dL (ref 8.4–10.4)
CO2: 22 mEq/L (ref 22–29)
Chloride: 107 mEq/L (ref 98–109)
Creatinine: 0.8 mg/dL (ref 0.7–1.3)
EGFR: 87 mL/min/{1.73_m2} — ABNORMAL LOW (ref >90)
Glucose: 97 mg/dl (ref 70–140)
POTASSIUM: 4.1 meq/L (ref 3.5–5.1)
SODIUM: 138 meq/L (ref 136–145)
Total Bilirubin: 0.32 mg/dL (ref 0.20–1.20)
Total Protein: 6.4 g/dL (ref 6.4–8.3)

## 2014-07-22 LAB — CBC WITH DIFFERENTIAL/PLATELET
BASO%: 0.6 % (ref 0.0–2.0)
Basophils Absolute: 0 10*3/uL (ref 0.0–0.1)
EOS ABS: 0.1 10*3/uL (ref 0.0–0.5)
EOS%: 2 % (ref 0.0–7.0)
HEMATOCRIT: 37.5 % — AB (ref 38.4–49.9)
HEMOGLOBIN: 12.3 g/dL — AB (ref 13.0–17.1)
LYMPH#: 0.6 10*3/uL — AB (ref 0.9–3.3)
LYMPH%: 14.2 % (ref 14.0–49.0)
MCH: 32.5 pg (ref 27.2–33.4)
MCHC: 32.8 g/dL (ref 32.0–36.0)
MCV: 99.1 fL — ABNORMAL HIGH (ref 79.3–98.0)
MONO#: 0.5 10*3/uL (ref 0.1–0.9)
MONO%: 11.3 % (ref 0.0–14.0)
NEUT#: 3.2 10*3/uL (ref 1.5–6.5)
NEUT%: 71.9 % (ref 39.0–75.0)
PLATELETS: 201 10*3/uL (ref 140–400)
RBC: 3.78 10*6/uL — AB (ref 4.20–5.82)
RDW: 13.4 % (ref 11.0–14.6)
WBC: 4.4 10*3/uL (ref 4.0–10.3)

## 2014-07-22 MED ORDER — HEPARIN SOD (PORK) LOCK FLUSH 100 UNIT/ML IV SOLN
500.0000 [IU] | Freq: Once | INTRAVENOUS | Status: AC
  Administered 2014-07-22: 500 [IU] via INTRAVENOUS
  Filled 2014-07-22: qty 5

## 2014-07-22 MED ORDER — SODIUM CHLORIDE 0.9 % IV SOLN
Freq: Once | INTRAVENOUS | Status: AC
  Administered 2014-07-22: 10:00:00 via INTRAVENOUS

## 2014-07-22 MED ORDER — HEPARIN SOD (PORK) LOCK FLUSH 100 UNIT/ML IV SOLN
500.0000 [IU] | Freq: Once | INTRAVENOUS | Status: AC | PRN
  Administered 2014-07-22: 500 [IU]
  Filled 2014-07-22: qty 5

## 2014-07-22 MED ORDER — SODIUM CHLORIDE 0.9 % IJ SOLN
10.0000 mL | INTRAMUSCULAR | Status: DC | PRN
  Administered 2014-07-22: 10 mL
  Filled 2014-07-22: qty 10

## 2014-07-22 MED ORDER — SODIUM CHLORIDE 0.9 % IV SOLN
3.0000 mg/kg | Freq: Once | INTRAVENOUS | Status: AC
  Administered 2014-07-22: 280 mg via INTRAVENOUS
  Filled 2014-07-22: qty 28

## 2014-07-22 MED ORDER — SODIUM CHLORIDE 0.9 % IJ SOLN
10.0000 mL | INTRAMUSCULAR | Status: DC | PRN
  Administered 2014-07-22: 10 mL via INTRAVENOUS
  Filled 2014-07-22: qty 10

## 2014-07-22 NOTE — Patient Instructions (Signed)
Continue  Using Sarna lotion for your back Follow up in 2 weeks

## 2014-07-22 NOTE — Progress Notes (Addendum)
[facility]  [phone] Fax:([phone]) [phone]  SHARED VISIT PROGRESS NOTE  [name], MD 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   Primary site: Lung (Right)   Staging method: AJCC 7th Edition   Clinical: Stage IIIA (T3, N2, M0) signed by Curt Bears, MD on 05/09/2013  2:56 PM   Summary: Stage IIIA (T3, N2, M0)  PRIOR THERAPY:  1) Concurrent chemoradiation with weekly carboplatin for an AUC of 2 and paclitaxel 45 mg per meter squared. Status post 5 cycles. 2) Bronchoscopy, mediastinoscopy, right posterior lateral thoracotomy with right upper lobectomy, lymph node dissection,  and posterior approach to en bloc chest wall resection, ribs 2, 3, and 4, with positive bronchial resection margin. 3) palliative radiotherapy to the best of resection margin under the care of Dr. Tammi Klippel completed on 10/14/2013. 4) systemic chemotherapy with carboplatin for an AUC of 5 given on day 1 and Abraxane 100 mg/m given days 1, 8 and 15 every 3 weeks status post 3 cycles discontinued 12/31/2013 secondary to disease progression  CURRENT THERAPY: Immunotherapy with Nivolumab 3 mg/kg given every 2 weeks.  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    INTERVAL HISTORY: Terry Robles 74 y.o. male returns for followup visit. Overall is tolerating his treatment with immunotherapy with  Nivolumab without significant difficulty. He denies any change in his baseline shortness of breath and continues to feel as if his breathing is slightly better. He denies diarrhea. .He reports the skin rash on his back is stable. This is likely from previous radiation treatment.  He states the area is less "itchy". He started using the Sarna lotion approximately one week ago and has found it helpful. Denies any other skin eruptions elsewhere on his body.  He continues to have some residual numbness/tingling in his toes as well as his right hand from previous chemotherapy. This remains stable.  He continues to have a cough which has been long-standing, primarily dry although occasionally productive of clear secretions. This is well managed with Delsym cough syrup during the day and Hycodan cough syrup at night. He continues to complain of pain in both hips and his right shoulder since the start of treatment. He has been taking Percocet to 3 times a day with reasonable relief. He requests refill prescriptions for his Percocet.  He voiced no other specific complaints today.  He has no hemoptysis. He denied having any significant weight loss or night sweats. He has no nausea or vomiting.  He is here to proceed with cycle #15 of his immunotherapy with Nivolumab.     MEDICAL HISTORY: Past Medical History  Diagnosis Date  . Hypercholesteremia 05/14/2011  . BPH (benign  prostatic hyperplasia) 05/14/2011    takes Uroxatral daily  . GERD (gastroesophageal reflux disease)   . Shortness of breath     weather related  . Pneumonia 1965  . Headache(784.0)     occasionally  . Joint pain   . Maintenance chemotherapy   . Urinary frequency   . History of shingles   . Anemia    . Acquired thrombocytopenia   . 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 Cancer     squamous cell carcinoma right superior sulcus  . Lung cancer     right lung    ALLERGIES:  has No Known Allergies.  MEDICATIONS:  Current Outpatient Prescriptions  Medication Sig Dispense Refill  . alfuzosin (UROXATRAL) 10 MG 24 hr tablet Take 10 mg by mouth daily with breakfast.    . cycloSPORINE (RESTASIS) 0.05 % ophthalmic emulsion 1 drop 2 (two) times daily.    Marland Kitchen docusate sodium (COLACE) 100 MG capsule Take 100 mg by mouth daily.    . feeding supplement, RESOURCE BREEZE, (RESOURCE BREEZE) LIQD Take 1 Container by mouth daily at 3 pm.  0  . HYDROcodone-homatropine (HYCODAN) 5-1.5 MG/5ML syrup Take 5 mLs by mouth every 6 (six) hours as needed for cough. 240 mL 0  . Multiple Vitamin (MULITIVITAMIN WITH MINERALS) TABS Take 1 tablet by mouth daily.    Marland Kitchen oxyCODONE-acetaminophen (PERCOCET/ROXICET) 5-325 MG per tablet Take 1 tablet by mouth every 6 (six) hours as needed for severe pain. 90 tablet 0  . polyethylene glycol (MIRALAX / GLYCOLAX) packet Take 17 g by mouth daily.    . prochlorperazine (COMPAZINE) 5 MG tablet Take 1 tablet (5 mg total) by mouth every 6 (six) hours as needed for nausea or vomiting. 30 tablet 2   No current facility-administered medications for this visit.    SURGICAL HISTORY:  Past Surgical History  Procedure Laterality Date  . Other surgical history      benign tumor removed from left leg several years ago  . Other surgical history      had melanoma/squamous cell removed previously per patient  . Tumor excision      Left knee  . Colonoscopy w/ biopsies and polypectomy    . Video bronchoscopy N/A 07/30/2013    Procedure: VIDEO BRONCHOSCOPY;  Surgeon: Grace Isaac, MD;  Location: [facility];  Service: Thoracic;  Laterality: N/A;  . Mediastinoscopy N/A 07/30/2013    Procedure: MEDIASTINOSCOPY;  Surgeon: Grace Isaac, MD;  Location: [facility];  Service: Thoracic;   Laterality: N/A;  . Video assisted thoracoscopy (vats)/wedge resection Right 07/30/2013    Procedure: VIDEO ASSISTED THORACOSCOPY;  Surgeon: Grace Isaac, MD;  Location: [facility];  Service: Thoracic;  Laterality: Right;  . Thoracotomy  07/30/2013    Procedure: THORACOTOMY FOR CHEST WALL RESECTION, RIGHT UPPER LOBECTOMY;  Surgeon: Grace Isaac, MD;  Location: [facility];  Service: Thoracic;;  . Adenoidectomy  as a child  . Tonsillectomy    . Portacath placement Left 10/30/2013    Procedure: INSERTION PORT-A-CATH WITH ULTRASOUND GUIDANCE AND FLUORO;  Surgeon: Grace Isaac, MD;  Location: [facility];  Service: Thoracic;  Laterality: Left;    REVIEW OF SYSTEMS:  Constitutional: positive for fatigue Eyes: negative Ears, nose, mouth, throat, and face: negative Respiratory: positive for cough and dyspnea on exertion Cardiovascular: negative Gastrointestinal: negative Genitourinary:negative Integument/breast: positive for pruritus, rash and likely related to previous radiation treatment Hematologic/lymphatic: negative Musculoskeletal:positive for arthralgias Neurological: positive for paresthesia  Behavioral/Psych: negative Endocrine: negative Allergic/Immunologic: negative   PHYSICAL EXAMINATION: General appearance: alert, cooperative, appears stated age and no distress Head: Normocephalic, without obvious abnormality, atraumatic Neck: no adenopathy, no carotid bruit, no JVD, supple, symmetrical, trachea midline and thyroid not enlarged, symmetric, no tenderness/mass/nodules Lymph nodes: Cervical, supraclavicular, and axillary nodes normal. Resp: clear to auscultation bilaterally Back: symmetric, no curvature. ROM normal. No CVA tenderness. Cardio: regular rate and rhythm, S1, S2 normal, no murmur, click, rub or gallop GI: soft, non-tender; bowel sounds normal; no masses,  no organomegaly Extremities: extremities normal, atraumatic, no cyanosis or edema and The left forearm and hand are cooler  to touch than the right Neurologic: Alert and oriented X 3, normal strength and tone. Normal symmetric reflexes. Normal coordination and gait Skin: Left anterior chest Port-A-Cath incisions are well healed, no evidence of infection. Examination of the back reveals generalized dry skin, although less dry than on previous exam, with a few scattered erythematous macular lesions.  No other skin eruptions noted elsewhere on the body.  ECOG PERFORMANCE STATUS: 1 - Symptomatic but completely ambulatory  Blood pressure 120/65, pulse 73, temperature 98.4 F (36.9 C), temperature source Oral, resp. rate 18, height '6\' 2"'$  (1.88 m), weight 206 lb 1.6 oz (93.486 kg), SpO2 100 %.  LABORATORY DATA: Lab Results  Component Value Date   WBC 4.4 07/22/2014   HGB 12.3* 07/22/2014   HCT 37.5* 07/22/2014   MCV 99.1* 07/22/2014   PLT 201 07/22/2014      Chemistry      Component Value Date/Time   NA 138 07/22/2014 0807   NA 140 12/19/2013 0405   K 4.1 07/22/2014 0807   K 4.2 12/19/2013 0405   CL 104 12/19/2013 0405   CO2 22 07/22/2014 0807   CO2 26 12/19/2013 0405   BUN 15.8 07/22/2014 0807   BUN 14 12/19/2013 0405   CREATININE 0.8 07/22/2014 0807   CREATININE 0.87 12/19/2013 0405      Component Value Date/Time   CALCIUM 8.8 07/22/2014 0807   CALCIUM 9.0 12/19/2013 0405   ALKPHOS 87 07/22/2014 0807   ALKPHOS 126* 12/18/2013 1610   AST 14 07/22/2014 0807   AST 17 12/18/2013 1610   ALT 13 07/22/2014 0807   ALT 16 12/18/2013 1610   BILITOT 0.32 07/22/2014 0807   BILITOT 0.4 12/18/2013 1610       RADIOGRAPHIC STUDIES: No results found.  ASSESSMENT/PLAN:  This is a very pleasant 74 years old white male recently diagnosed with a stage IIIA non-small cell lung cancer. He completed a course of concurrent chemoradiation with weekly carboplatin and paclitaxel status post 5 cycle, this was followed by right upper lobectomy with lymph node dissection under the care of Dr. Servando Snare. The final pathology  showed positive bronchial resection margin with a pathologic stage ypT3, ypN0. The patient underwent a course of radiotherapy to the positive resection margin but unfortunately the restaging CT scan of the chest showed evidence for disease progression with new lytic rib lesion as well as enlarged mediastinal lymphadenopathy. The MRI of the brain was negative for brain metastasis. He is status post 3 cycles of systemic chemotherapy with carboplatin for AUC of 5 on day 1 and Abraxane 100 mg/M2 on days 1, 8 and 15 every 3 weeks. However his restaging CT scan again showed evidence for disease progression. He is now being treated with immunotherapy with Nivolumab. He is status post 14 cycles. The most recent restaging CT scan shows a resolution of the soft tissue mass  that was previously seen at the posterior left seventh rib, there were stable postsurgical and postradiation therapy changes of the right hemithorax, no new intrathoracic abnormalities. There were no new intra-abdominal or intrapelvic abnormalities.  Patient was discussed with and also seen by Dr. Julien Nordmann.  Overall he's had significant improvement in his disease and we will continue with cycle #15 today as scheduled. Marland KitchenHe is  advised to continue using Sarna lotion for his itching skin on his back.  He will follow-up in 2 weeks prior to the start of cycle #16.     Carlton Adam, PA-C 07/22/2014  ADDENDUM:  Hematology/Oncology Attending:  I had a face to face encounter with the patient. I recommended his care plan. This is a very pleasant 74 years old white male with metastatic non-small cell lung cancer, squamous cell carcinoma who is currently undergoing immunotherapy with Nivolumab status post 14 cycles. The patient is tolerating his treatment fairly well with no significant adverse effects. I recommended for him to proceed with cycle #15 today as scheduled. He would come back for follow-up visit in 2 weeks for reevaluation and management  of any adverse effect of his treatment. For the skin itching on the back he will continue with Sarna lotion. The patient was advised to call immediately if he has any concerning symptoms in the interval.  Disclaimer: This note was dictated with voice recognition software. Similar sounding words can inadvertently be transcribed and may be missed upon review. [name]., MD 07/26/2014

## 2014-07-22 NOTE — Patient Instructions (Signed)

## 2014-07-22 NOTE — Patient Instructions (Signed)
[facility] Discharge Instructions for Patients Receiving Chemotherapy  Today you received the following chemotherapy agents: nivolumab  To help prevent nausea and vomiting after your treatment, we encourage you to take your nausea medication.  Take it as often as prescribed.     If you develop nausea and vomiting that is not controlled by your nausea medication, call the clinic. If it is after clinic hours your family physician or the after hours number for the clinic or go to the Emergency Department.   BELOW ARE SYMPTOMS THAT SHOULD BE REPORTED IMMEDIATELY:  *FEVER GREATER THAN 100.5 F  *CHILLS WITH OR WITHOUT FEVER  NAUSEA AND VOMITING THAT IS NOT CONTROLLED WITH YOUR NAUSEA MEDICATION  *UNUSUAL SHORTNESS OF BREATH  *UNUSUAL BRUISING OR BLEEDING  TENDERNESS IN MOUTH AND THROAT WITH OR WITHOUT PRESENCE OF ULCERS  *URINARY PROBLEMS  *BOWEL PROBLEMS  UNUSUAL RASH Items with * indicate a potential emergency and should be followed up as soon as possible.  Feel free to call the clinic you have any questions or concerns. The clinic phone number is [phone].   I have been informed and understand all the instructions given to me. I know to contact the clinic, my physician, or go to the Emergency Department if any problems should occur. I do not have any questions at this time, but understand that I may call the clinic during office hours   should I have any questions or need assistance in obtaining follow up care.    __________________________________________  _____________  __________ Signature of Patient or Authorized Representative            Date                   Time    __________________________________________ Nurse's Signature    

## 2014-07-22 NOTE — Telephone Encounter (Signed)
Gave avs & calendar for May & June °

## 2014-08-05 ENCOUNTER — Ambulatory Visit (HOSPITAL_BASED_OUTPATIENT_CLINIC_OR_DEPARTMENT_OTHER): Payer: Medicare Other

## 2014-08-05 ENCOUNTER — Ambulatory Visit: Payer: Medicare Other

## 2014-08-05 ENCOUNTER — Telehealth: Payer: Self-pay | Admitting: Internal Medicine

## 2014-08-05 ENCOUNTER — Other Ambulatory Visit (HOSPITAL_BASED_OUTPATIENT_CLINIC_OR_DEPARTMENT_OTHER): Payer: Medicare Other

## 2014-08-05 ENCOUNTER — Ambulatory Visit (HOSPITAL_BASED_OUTPATIENT_CLINIC_OR_DEPARTMENT_OTHER): Payer: Medicare Other | Admitting: Internal Medicine

## 2014-08-05 ENCOUNTER — Encounter: Payer: Self-pay | Admitting: Internal Medicine

## 2014-08-05 VITALS — BP 123/67 | HR 71 | Temp 97.8°F | Resp 18 | Ht 74.0 in | Wt 205.9 lb

## 2014-08-05 DIAGNOSIS — C3411 Malignant neoplasm of upper lobe, right bronchus or lung: Secondary | ICD-10-CM | POA: Diagnosis present

## 2014-08-05 DIAGNOSIS — Z95828 Presence of other vascular implants and grafts: Secondary | ICD-10-CM

## 2014-08-05 DIAGNOSIS — Z79899 Other long term (current) drug therapy: Secondary | ICD-10-CM

## 2014-08-05 DIAGNOSIS — C3492 Malignant neoplasm of unspecified part of left bronchus or lung: Secondary | ICD-10-CM

## 2014-08-05 DIAGNOSIS — Z5112 Encounter for antineoplastic immunotherapy: Secondary | ICD-10-CM | POA: Diagnosis present

## 2014-08-05 LAB — COMPREHENSIVE METABOLIC PANEL (CC13)
ALT: 14 U/L (ref 0–55)
AST: 16 U/L (ref 5–34)
Albumin: 3.3 g/dL — ABNORMAL LOW (ref 3.5–5.0)
Alkaline Phosphatase: 82 U/L (ref 40–150)
Anion Gap: 9 mEq/L (ref 3–11)
BUN: 14.5 mg/dL (ref 7.0–26.0)
CALCIUM: 8.9 mg/dL (ref 8.4–10.4)
CHLORIDE: 105 meq/L (ref 98–109)
CO2: 25 mEq/L (ref 22–29)
CREATININE: 0.9 mg/dL (ref 0.7–1.3)
EGFR: 85 mL/min/{1.73_m2} — AB (ref >90)
Glucose: 99 mg/dl (ref 70–140)
POTASSIUM: 4.1 meq/L (ref 3.5–5.1)
Sodium: 139 mEq/L (ref 136–145)
Total Bilirubin: 0.48 mg/dL (ref 0.20–1.20)
Total Protein: 6.5 g/dL (ref 6.4–8.3)

## 2014-08-05 LAB — CBC WITH DIFFERENTIAL/PLATELET
BASO%: 0.2 % (ref 0.0–2.0)
Basophils Absolute: 0 10*3/uL (ref 0.0–0.1)
EOS%: 1.2 % (ref 0.0–7.0)
Eosinophils Absolute: 0.1 10*3/uL (ref 0.0–0.5)
HEMATOCRIT: 38.6 % (ref 38.4–49.9)
HGB: 12.8 g/dL — ABNORMAL LOW (ref 13.0–17.1)
LYMPH#: 0.7 10*3/uL — AB (ref 0.9–3.3)
LYMPH%: 12.2 % — AB (ref 14.0–49.0)
MCH: 33.4 pg (ref 27.2–33.4)
MCHC: 33.2 g/dL (ref 32.0–36.0)
MCV: 100.8 fL — ABNORMAL HIGH (ref 79.3–98.0)
MONO#: 0.5 10*3/uL (ref 0.1–0.9)
MONO%: 8.9 % (ref 0.0–14.0)
NEUT#: 4.5 10*3/uL (ref 1.5–6.5)
NEUT%: 77.5 % — AB (ref 39.0–75.0)
Platelets: 166 10*3/uL (ref 140–400)
RBC: 3.83 10*6/uL — ABNORMAL LOW (ref 4.20–5.82)
RDW: 13.7 % (ref 11.0–14.6)
WBC: 5.8 10*3/uL (ref 4.0–10.3)

## 2014-08-05 LAB — TSH CHCC: TSH: 2.98 m(IU)/L (ref 0.320–4.118)

## 2014-08-05 MED ORDER — SODIUM CHLORIDE 0.9 % IV SOLN
3.0000 mg/kg | Freq: Once | INTRAVENOUS | Status: AC
  Administered 2014-08-05: 280 mg via INTRAVENOUS
  Filled 2014-08-05: qty 28

## 2014-08-05 MED ORDER — SODIUM CHLORIDE 0.9 % IJ SOLN
10.0000 mL | INTRAMUSCULAR | Status: DC | PRN
  Administered 2014-08-05: 10 mL
  Filled 2014-08-05: qty 10

## 2014-08-05 MED ORDER — SODIUM CHLORIDE 0.9 % IV SOLN
Freq: Once | INTRAVENOUS | Status: AC
  Administered 2014-08-05: 09:00:00 via INTRAVENOUS

## 2014-08-05 MED ORDER — SODIUM CHLORIDE 0.9 % IJ SOLN
10.0000 mL | INTRAMUSCULAR | Status: DC | PRN
  Administered 2014-08-05: 10 mL via INTRAVENOUS
  Filled 2014-08-05: qty 10

## 2014-08-05 MED ORDER — OXYCODONE-ACETAMINOPHEN 5-325 MG PO TABS: 1.0000 | ORAL_TABLET | Freq: Four times a day (QID) | ORAL | Status: DC | PRN

## 2014-08-05 MED ORDER — HYDROCODONE-HOMATROPINE 5-1.5 MG/5ML PO SYRP: 5.0000 mL | ORAL_SOLUTION | Freq: Four times a day (QID) | ORAL | Status: DC | PRN

## 2014-08-05 MED ORDER — HEPARIN SOD (PORK) LOCK FLUSH 100 UNIT/ML IV SOLN
500.0000 [IU] | Freq: Once | INTRAVENOUS | Status: AC | PRN
  Administered 2014-08-05: 500 [IU]
  Filled 2014-08-05: qty 5

## 2014-08-05 MED ORDER — PROCHLORPERAZINE MALEATE 5 MG PO TABS: 5.0000 mg | ORAL_TABLET | Freq: Four times a day (QID) | ORAL | Status: DC | PRN

## 2014-08-05 NOTE — Progress Notes (Signed)
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  Primary site: Lung (Right)  Staging method: AJCC 7th Edition  Clinical: Stage IIIA (T3, N2, M0) signed by Curt Bears, MD on 05/09/2013 2:56 PM  Summary: Stage IIIA (T3, N2, M0)  PRIOR THERAPY:  1) Concurrent chemoradiation with weekly carboplatin for an AUC of 2 and paclitaxel 45 mg per meter squared. Status post 5 cycles. 2) Bronchoscopy, mediastinoscopy, right posterior lateral thoracotomy with right upper lobectomy, lymph node dissection,  and posterior approach to en bloc chest wall resection, ribs 2, 3, and 4, with positive bronchial resection margin. 3) palliative radiotherapy to the best of resection margin under the care of Dr. Tammi Klippel completed on 10/14/2013. 4) systemic chemotherapy with carboplatin for an AUC of 5 given on day 1 and Abraxane 100 mg/m given days 1, 8 and 15 every 3 weeks status post 3 cycles discontinued 12/31/2013 secondary to disease progression  CURRENT THERAPY: Immunotherapy with Nivolumab 3 mg/kg given every 2 weeks. 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   INTERVAL HISTORY: KAMIL MCHAFFIE 74 y.o. male returns to the clinic today for the patient is currently undergoing immunotherapy with Nivolumab status post 15 cycles and tolerating his treatment fairly well with no significant adverse effects. He denied having any significant nausea or vomiting. He has no fever or chills. He denied having any significant diarrhea. He still have a small area of skin rash on the back at the radiation port and he is applying Serna came to that area. The patient is here today to start cycle #16 of his treatment. He denied having any significant chest pain, shortness of breath, cough or hemoptysis. He has no significant weight loss or night sweats.  MEDICAL HISTORY: Past Medical History  Diagnosis Date  . Hypercholesteremia 05/14/2011  . BPH (benign prostatic hyperplasia) 05/14/2011    takes Uroxatral daily  . GERD (gastroesophageal reflux disease)   . Shortness of breath     weather related  . Pneumonia 1965  . Headache(784.0)     occasionally  . Joint pain   . Maintenance chemotherapy   . Urinary frequency   . History of shingles   . Anemia   . Acquired thrombocytopenia   . 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 Cancer     squamous cell carcinoma right superior sulcus  . Lung cancer     right lung    ALLERGIES:  has No Known Allergies.  MEDICATIONS:  Current Outpatient Prescriptions  Medication Sig Dispense Refill  . alfuzosin (UROXATRAL) 10 MG 24 hr  tablet Take 10 mg by mouth daily with breakfast.    . cycloSPORINE (RESTASIS) 0.05 % ophthalmic emulsion 1 drop 2 (two) times daily.    Marland Kitchen docusate sodium (COLACE) 100 MG capsule Take 100 mg by mouth daily.    . feeding supplement, RESOURCE BREEZE, (RESOURCE BREEZE) LIQD Take 1 Container by mouth daily at 3 pm.  0  . HYDROcodone-homatropine (HYCODAN) 5-1.5 MG/5ML syrup Take 5 mLs by  mouth every 6 (six) hours as needed for cough. 240 mL 0  . Multiple Vitamin (MULITIVITAMIN WITH MINERALS) TABS Take 1 tablet by mouth daily.    Marland Kitchen oxyCODONE-acetaminophen (PERCOCET/ROXICET) 5-325 MG per tablet Take 1 tablet by mouth every 6 (six) hours as needed for severe pain. 90 tablet 0  . polyethylene glycol (MIRALAX / GLYCOLAX) packet Take 17 g by mouth daily.    . prochlorperazine (COMPAZINE) 5 MG tablet Take 1 tablet (5 mg total) by mouth every 6 (six) hours as needed for nausea or vomiting. 30 tablet 2   No current facility-administered medications for this visit.   Facility-Administered Medications Ordered in Other Visits  Medication Dose Route Frequency Provider Last Rate Last Dose  . sodium chloride 0.9 % injection 10 mL  10 mL Intravenous PRN Curt Bears, MD   10 mL at 08/05/14 0802    SURGICAL HISTORY:  Past Surgical History  Procedure Laterality Date  . Other surgical history      benign tumor removed from left leg several years ago  . Other surgical history      had melanoma/squamous cell removed previously per patient  . Tumor excision      Left knee  . Colonoscopy w/ biopsies and polypectomy    . Video bronchoscopy N/A 07/30/2013    Procedure: VIDEO BRONCHOSCOPY;  Surgeon: Grace Isaac, MD;  Location: [facility];  Service: Thoracic;  Laterality: N/A;  . Mediastinoscopy N/A 07/30/2013    Procedure: MEDIASTINOSCOPY;  Surgeon: Grace Isaac, MD;  Location: [facility];  Service: Thoracic;  Laterality: N/A;  . Video assisted thoracoscopy (vats)/wedge resection Right 07/30/2013    Procedure: VIDEO ASSISTED THORACOSCOPY;  Surgeon: Grace Isaac, MD;  Location: [facility];  Service: Thoracic;  Laterality: Right;  . Thoracotomy  07/30/2013    Procedure: THORACOTOMY FOR CHEST WALL RESECTION, RIGHT UPPER LOBECTOMY;  Surgeon: Grace Isaac, MD;  Location: [facility];  Service: Thoracic;;  . Adenoidectomy  as a child  . Tonsillectomy    . Portacath placement Left 10/30/2013    Procedure:  INSERTION PORT-A-CATH WITH ULTRASOUND GUIDANCE AND FLUORO;  Surgeon: Grace Isaac, MD;  Location: [facility];  Service: Thoracic;  Laterality: Left;    REVIEW OF SYSTEMS:  A comprehensive review of systems was negative.   PHYSICAL EXAMINATION: General appearance: alert, cooperative and no distress Head: Normocephalic, without obvious abnormality, atraumatic Neck: no adenopathy, no JVD, supple, symmetrical, trachea midline and thyroid not enlarged, symmetric, no tenderness/mass/nodules Lymph nodes: Cervical, supraclavicular, and axillary nodes normal. Resp: clear to auscultation bilaterally Back: symmetric, no curvature. ROM normal. No CVA tenderness. Cardio: regular rate and rhythm, S1, S2 normal, no murmur, click, rub or gallop GI: soft, non-tender; bowel sounds normal; no masses,  no organomegaly Extremities: extremities normal, atraumatic, no cyanosis or edema  ECOG PERFORMANCE STATUS: 0 - Asymptomatic  There were no vitals taken for this visit.  LABORATORY DATA: Lab Results  Component Value Date   WBC 5.8 08/05/2014   HGB 12.8* 08/05/2014   HCT 38.6 08/05/2014   MCV 100.8*  08/05/2014   PLT 166 08/05/2014      Chemistry      Component Value Date/Time   NA 138 07/22/2014 0807   NA 140 12/19/2013 0405   K 4.1 07/22/2014 0807   K 4.2 12/19/2013 0405   CL 104 12/19/2013 0405   CO2 22 07/22/2014 0807   CO2 26 12/19/2013 0405   BUN 15.8 07/22/2014 0807   BUN 14 12/19/2013 0405   CREATININE 0.8 07/22/2014 0807   CREATININE 0.87 12/19/2013 0405      Component Value Date/Time   CALCIUM 8.8 07/22/2014 0807   CALCIUM 9.0 12/19/2013 0405   ALKPHOS 87 07/22/2014 0807   ALKPHOS 126* 12/18/2013 1610   AST 14 07/22/2014 0807   AST 17 12/18/2013 1610   ALT 13 07/22/2014 0807   ALT 16 12/18/2013 1610   BILITOT 0.32 07/22/2014 0807   BILITOT 0.4 12/18/2013 1610       RADIOGRAPHIC STUDIES: No results found.  ASSESSMENT AND PLAN: This is a very pleasant 74 years old white  male with metastatic non-small cell lung cancer, squamous cell carcinoma. He is currently undergoing treatment with immunotherapy with Nivolumab status post 15 cycles and tolerating his treatment fairly well. I recommended for the patient to proceed with cycle #16 today as a scheduled. He would come back for follow-up visit in 2 weeks for reevaluation after repeating CT scan of the chest, abdomen and pelvis for restaging of his disease. For cough, the patient was given a refill of Vicodin. For pain management, the patient was given a refill of Percocet. He was advised to call immediately if he has any concerning symptoms in the interval. The patient voices understanding of current disease status and treatment options and is in agreement with the current care plan.  All questions were answered. The patient knows to call the clinic with any problems, questions or concerns. We can certainly see the patient much sooner if necessary.  Disclaimer: This note was dictated with voice recognition software. Similar sounding words can inadvertently be transcribed and may not be corrected upon review.

## 2014-08-05 NOTE — Patient Instructions (Signed)

## 2014-08-05 NOTE — Telephone Encounter (Signed)
Gave and prnted appt sched and avs for pt for May and June....gave pt barium

## 2014-08-05 NOTE — Patient Instructions (Signed)
[facility] Discharge Instructions for Patients Receiving Chemotherapy  Today you received the following chemotherapy agents: nivolumab  To help prevent nausea and vomiting after your treatment, we encourage you to take your nausea medication.  Take it as often as prescribed.     If you develop nausea and vomiting that is not controlled by your nausea medication, call the clinic. If it is after clinic hours your family physician or the after hours number for the clinic or go to the Emergency Department.   BELOW ARE SYMPTOMS THAT SHOULD BE REPORTED IMMEDIATELY:  *FEVER GREATER THAN 100.5 F  *CHILLS WITH OR WITHOUT FEVER  NAUSEA AND VOMITING THAT IS NOT CONTROLLED WITH YOUR NAUSEA MEDICATION  *UNUSUAL SHORTNESS OF BREATH  *UNUSUAL BRUISING OR BLEEDING  TENDERNESS IN MOUTH AND THROAT WITH OR WITHOUT PRESENCE OF ULCERS  *URINARY PROBLEMS  *BOWEL PROBLEMS  UNUSUAL RASH Items with * indicate a potential emergency and should be followed up as soon as possible.  Feel free to call the clinic you have any questions or concerns. The clinic phone number is [phone].   I have been informed and understand all the instructions given to me. I know to contact the clinic, my physician, or go to the Emergency Department if any problems should occur. I do not have any questions at this time, but understand that I may call the clinic during office hours   should I have any questions or need assistance in obtaining follow up care.    __________________________________________  _____________  __________ Signature of Patient or Authorized Representative            Date                   Time    __________________________________________ Nurse's Signature    

## 2014-08-06 ENCOUNTER — Encounter: Payer: Self-pay | Admitting: Internal Medicine

## 2014-08-07 ENCOUNTER — Telehealth: Payer: Self-pay | Admitting: *Deleted

## 2014-08-07 NOTE — Telephone Encounter (Signed)
Called pt regarding email. Discussed with pt's wife pt will need to arrive 2 hrs early for his 5/20 11am scan,  if he was not given contrast for CT scan. His CT Chest and Body will be completed during his visit epic. I do not have information on an 8am arrival time, pt may need to call [facility] for additional information. Wife verbalized understanding, no further concerns.

## 2014-08-15 ENCOUNTER — Other Ambulatory Visit (HOSPITAL_COMMUNITY): Payer: Medicare Other

## 2014-08-15 ENCOUNTER — Ambulatory Visit (HOSPITAL_COMMUNITY)
Admission: RE | Admit: 2014-08-15 | Discharge: 2014-08-15 | Disposition: A | Discharge status: Home / Self Care | Payer: Medicare Other | Source: Ambulatory Visit | Attending: Internal Medicine | Admitting: Internal Medicine

## 2014-08-15 ENCOUNTER — Encounter: Payer: Self-pay | Admitting: Internal Medicine

## 2014-08-15 DIAGNOSIS — Z923 Personal history of irradiation: Secondary | ICD-10-CM | POA: Diagnosis not present

## 2014-08-15 DIAGNOSIS — Z9221 Personal history of antineoplastic chemotherapy: Secondary | ICD-10-CM | POA: Insufficient documentation

## 2014-08-15 DIAGNOSIS — K8020 Calculus of gallbladder without cholecystitis without obstruction: Secondary | ICD-10-CM | POA: Insufficient documentation

## 2014-08-15 DIAGNOSIS — C3411 Malignant neoplasm of upper lobe, right bronchus or lung: Secondary | ICD-10-CM | POA: Insufficient documentation

## 2014-08-15 MED ORDER — IOHEXOL 300 MG/ML  SOLN
100.0000 mL | Freq: Once | INTRAMUSCULAR | Status: AC | PRN
  Administered 2014-08-15: 100 mL via INTRAVENOUS

## 2014-08-19 ENCOUNTER — Encounter: Payer: Self-pay | Admitting: Physician Assistant

## 2014-08-19 ENCOUNTER — Other Ambulatory Visit: Payer: Medicare Other

## 2014-08-19 ENCOUNTER — Other Ambulatory Visit (HOSPITAL_BASED_OUTPATIENT_CLINIC_OR_DEPARTMENT_OTHER): Payer: Medicare Other

## 2014-08-19 ENCOUNTER — Ambulatory Visit: Payer: Medicare Other

## 2014-08-19 ENCOUNTER — Ambulatory Visit (HOSPITAL_BASED_OUTPATIENT_CLINIC_OR_DEPARTMENT_OTHER): Payer: Medicare Other | Admitting: Physician Assistant

## 2014-08-19 ENCOUNTER — Telehealth: Payer: Self-pay | Admitting: Internal Medicine

## 2014-08-19 ENCOUNTER — Ambulatory Visit (HOSPITAL_BASED_OUTPATIENT_CLINIC_OR_DEPARTMENT_OTHER): Payer: Medicare Other

## 2014-08-19 VITALS — BP 117/67 | HR 72 | Temp 98.0°F | Resp 17 | Ht 74.0 in | Wt 207.9 lb

## 2014-08-19 DIAGNOSIS — C3492 Malignant neoplasm of unspecified part of left bronchus or lung: Secondary | ICD-10-CM

## 2014-08-19 DIAGNOSIS — L989 Disorder of the skin and subcutaneous tissue, unspecified: Secondary | ICD-10-CM

## 2014-08-19 DIAGNOSIS — C3411 Malignant neoplasm of upper lobe, right bronchus or lung: Secondary | ICD-10-CM | POA: Diagnosis present

## 2014-08-19 DIAGNOSIS — Z5112 Encounter for antineoplastic immunotherapy: Secondary | ICD-10-CM | POA: Diagnosis present

## 2014-08-19 DIAGNOSIS — R599 Enlarged lymph nodes, unspecified: Secondary | ICD-10-CM

## 2014-08-19 DIAGNOSIS — C7951 Secondary malignant neoplasm of bone: Secondary | ICD-10-CM

## 2014-08-19 DIAGNOSIS — Z95828 Presence of other vascular implants and grafts: Secondary | ICD-10-CM

## 2014-08-19 LAB — CBC WITH DIFFERENTIAL/PLATELET
BASO%: 0.2 % (ref 0.0–2.0)
Basophils Absolute: 0 10*3/uL (ref 0.0–0.1)
EOS%: 1.6 % (ref 0.0–7.0)
Eosinophils Absolute: 0.1 10*3/uL (ref 0.0–0.5)
HEMATOCRIT: 37.5 % — AB (ref 38.4–49.9)
HGB: 12.5 g/dL — ABNORMAL LOW (ref 13.0–17.1)
LYMPH#: 0.6 10*3/uL — AB (ref 0.9–3.3)
LYMPH%: 14.5 % (ref 14.0–49.0)
MCH: 33.7 pg — AB (ref 27.2–33.4)
MCHC: 33.3 g/dL (ref 32.0–36.0)
MCV: 101.1 fL — AB (ref 79.3–98.0)
MONO#: 0.5 10*3/uL (ref 0.1–0.9)
MONO%: 12 % (ref 0.0–14.0)
NEUT%: 71.7 % (ref 39.0–75.0)
NEUTROS ABS: 3.2 10*3/uL (ref 1.5–6.5)
Platelets: 180 10*3/uL (ref 140–400)
RBC: 3.71 10*6/uL — AB (ref 4.20–5.82)
RDW: 13.6 % (ref 11.0–14.6)
WBC: 4.4 10*3/uL (ref 4.0–10.3)

## 2014-08-19 LAB — COMPREHENSIVE METABOLIC PANEL (CC13)
ALT: 18 U/L (ref 0–55)
ANION GAP: 11 meq/L (ref 3–11)
AST: 21 U/L (ref 5–34)
Albumin: 3.2 g/dL — ABNORMAL LOW (ref 3.5–5.0)
Alkaline Phosphatase: 88 U/L (ref 40–150)
BUN: 13 mg/dL (ref 7.0–26.0)
CALCIUM: 8.6 mg/dL (ref 8.4–10.4)
CHLORIDE: 107 meq/L (ref 98–109)
CO2: 23 mEq/L (ref 22–29)
Creatinine: 0.9 mg/dL (ref 0.7–1.3)
EGFR: 85 mL/min/{1.73_m2} — ABNORMAL LOW (ref >90)
GLUCOSE: 92 mg/dL (ref 70–140)
Potassium: 4.2 mEq/L (ref 3.5–5.1)
SODIUM: 141 meq/L (ref 136–145)
Total Bilirubin: 0.39 mg/dL (ref 0.20–1.20)
Total Protein: 6.2 g/dL — ABNORMAL LOW (ref 6.4–8.3)

## 2014-08-19 MED ORDER — SODIUM CHLORIDE 0.9 % IV SOLN
3.0000 mg/kg | Freq: Once | INTRAVENOUS | Status: AC
  Administered 2014-08-19: 280 mg via INTRAVENOUS
  Filled 2014-08-19: qty 28

## 2014-08-19 MED ORDER — SODIUM CHLORIDE 0.9 % IJ SOLN
10.0000 mL | INTRAMUSCULAR | Status: DC | PRN
  Administered 2014-08-19: 10 mL via INTRAVENOUS
  Filled 2014-08-19: qty 10

## 2014-08-19 MED ORDER — SODIUM CHLORIDE 0.9 % IJ SOLN
10.0000 mL | INTRAMUSCULAR | Status: DC | PRN
  Administered 2014-08-19: 10 mL
  Filled 2014-08-19: qty 10

## 2014-08-19 MED ORDER — SODIUM CHLORIDE 0.9 % IV SOLN
Freq: Once | INTRAVENOUS | Status: AC
  Administered 2014-08-19: 11:00:00 via INTRAVENOUS

## 2014-08-19 MED ORDER — HEPARIN SOD (PORK) LOCK FLUSH 100 UNIT/ML IV SOLN
500.0000 [IU] | Freq: Once | INTRAVENOUS | Status: AC | PRN
  Administered 2014-08-19: 500 [IU]
  Filled 2014-08-19: qty 5

## 2014-08-19 NOTE — Telephone Encounter (Signed)
Faxed pt medical records to [facility] [phone]

## 2014-08-19 NOTE — Patient Instructions (Signed)

## 2014-08-19 NOTE — Patient Instructions (Signed)
[facility] Discharge Instructions for Patients  Today you received the following: Nivolumab   To help prevent nausea and vomiting after your treatment, we encourage you to take your nausea medication as directed.    If you develop nausea and vomiting that is not controlled by your nausea medication, call the clinic.   BELOW ARE SYMPTOMS THAT SHOULD BE REPORTED IMMEDIATELY:  *FEVER GREATER THAN 100.5 F  *CHILLS WITH OR WITHOUT FEVER  NAUSEA AND VOMITING THAT IS NOT CONTROLLED WITH YOUR NAUSEA MEDICATION  *UNUSUAL SHORTNESS OF BREATH  *UNUSUAL BRUISING OR BLEEDING  TENDERNESS IN MOUTH AND THROAT WITH OR WITHOUT PRESENCE OF ULCERS  *URINARY PROBLEMS  *BOWEL PROBLEMS  UNUSUAL RASH Items with * indicate a potential emergency and should be followed up as soon as possible.  Feel free to call the clinic you have any questions or concerns. The clinic phone number is [phone].  Please show the CHEMO ALERT CARD at check-in to the Emergency Department and triage nurse.   

## 2014-08-19 NOTE — Progress Notes (Addendum)
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  Primary site: Lung (Right)  Staging method: AJCC 7th Edition  Clinical: Stage IIIA (T3, N2, M0) signed by Curt Bears, MD on 05/09/2013 2:56 PM  Summary: Stage IIIA (T3, N2, M0)  PRIOR THERAPY:  1) Concurrent chemoradiation with weekly carboplatin for an AUC of 2 and paclitaxel 45 mg per meter squared. Status post 5 cycles. 2) Bronchoscopy, mediastinoscopy, right posterior lateral thoracotomy with right upper lobectomy, lymph node dissection,  and posterior approach to en bloc chest wall resection, ribs 2, 3, and 4, with positive bronchial resection margin. 3) palliative radiotherapy to the best of resection margin under the care of Dr. Tammi Klippel completed on 10/14/2013. 4) systemic chemotherapy with carboplatin for an AUC of 5 given on day 1 and Abraxane 100 mg/m given days 1, 8 and 15 every 3 weeks status post 3 cycles discontinued 12/31/2013 secondary to disease progression  CURRENT THERAPY: Immunotherapy with Nivolumab 3 mg/kg given every 2 weeks. 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   INTERVAL HISTORY: TYCEN DOCKTER 74 y.o. male returns to the clinic today for the patient is currently undergoing immunotherapy with Nivolumab status post 16 cycles and tolerating his treatment fairly well with no significant adverse effects. He denied having any significant nausea or vomiting. He has no fever or chills. He denied having any significant diarrhea. He still has a small area of skin rash on the back at the radiation port and he is applying Sarna lotion to that area. He has a lesion on his back that he believes is getting larger. Denied tenderness or bleeding. The patient is here today to start cycle #17 of his treatment. He denied having any significant chest pain, shortness of breath, cough or hemoptysis. He has no significant weight loss or night sweats. He had a restaging CT scan of his chest, abdomen and pelvis and presents to discuss the results. He reports having explosive diarrhea after the CT scan related to the contrast he drank for the study.  MEDICAL HISTORY: Past Medical History  Diagnosis Date  . Hypercholesteremia 05/14/2011  . BPH (benign prostatic hyperplasia) 05/14/2011    takes Uroxatral daily  . GERD (gastroesophageal reflux disease)   . Shortness of breath     weather related  . Pneumonia 1965  . Headache(784.0)     occasionally  . Joint pain   . Maintenance chemotherapy   . Urinary frequency   . History of shingles   . Anemia   . Acquired thrombocytopenia   . 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  Cancer     squamous cell carcinoma right superior sulcus  . Lung cancer     right lung    ALLERGIES:  has No Known Allergies.  MEDICATIONS:  Current Outpatient Prescriptions  Medication Sig Dispense Refill  . alfuzosin (UROXATRAL) 10 MG 24 hr tablet Take 10 mg by mouth daily with breakfast.    . cycloSPORINE (RESTASIS) 0.05 %  ophthalmic emulsion 1 drop 2 (two) times daily.    Marland Kitchen docusate sodium (COLACE) 100 MG capsule Take 100 mg by mouth daily.    Marland Kitchen HYDROcodone-homatropine (HYCODAN) 5-1.5 MG/5ML syrup Take 5 mLs by mouth every 6 (six) hours as needed for cough. 240 mL 0  . Multiple Vitamin (MULITIVITAMIN WITH MINERALS) TABS Take 1 tablet by mouth daily.    Marland Kitchen oxyCODONE-acetaminophen (PERCOCET/ROXICET) 5-325 MG per tablet Take 1 tablet by mouth every 6 (six) hours as needed for severe pain. 90 tablet 0  . polyethylene glycol (MIRALAX / GLYCOLAX) packet Take 17 g by mouth daily.    . prochlorperazine (COMPAZINE) 5 MG tablet Take 1 tablet (5 mg total) by mouth every 6 (six) hours as needed for nausea or vomiting. 30 tablet 2  . feeding supplement, RESOURCE BREEZE, (RESOURCE BREEZE) LIQD Take 1 Container by mouth daily at 3 pm. (Patient not taking: Reported on 08/19/2014)  0   No current facility-administered medications for this visit.   Facility-Administered Medications Ordered in Other Visits  Medication Dose Route Frequency Provider Last Rate Last Dose  . sodium chloride 0.9 % injection 10 mL  10 mL Intracatheter PRN Curt Bears, MD   10 mL at 08/19/14 1226    SURGICAL HISTORY:  Past Surgical History  Procedure Laterality Date  . Other surgical history      benign tumor removed from left leg several years ago  . Other surgical history      had melanoma/squamous cell removed previously per patient  . Tumor excision      Left knee  . Colonoscopy w/ biopsies and polypectomy    . Video bronchoscopy N/A 07/30/2013    Procedure: VIDEO BRONCHOSCOPY;  Surgeon: Grace Isaac, MD;  Location: [place];  Service: Thoracic;  Laterality: N/A;  . Mediastinoscopy N/A 07/30/2013    Procedure: MEDIASTINOSCOPY;  Surgeon: Grace Isaac, MD;  Location: [place];  Service: Thoracic;  Laterality: N/A;  . Video assisted thoracoscopy (vats)/wedge resection Right 07/30/2013    Procedure: VIDEO ASSISTED THORACOSCOPY;  Surgeon: Grace Isaac, MD;  Location: [place];  Service: Thoracic;  Laterality: Right;  . Thoracotomy  07/30/2013    Procedure: THORACOTOMY FOR CHEST WALL RESECTION, RIGHT UPPER LOBECTOMY;  Surgeon: Grace Isaac, MD;  Location: [place];  Service: Thoracic;;  . Adenoidectomy  as a child  . Tonsillectomy    . Portacath placement Left 10/30/2013    Procedure: INSERTION PORT-A-CATH WITH ULTRASOUND GUIDANCE AND FLUORO;  Surgeon: Grace Isaac, MD;  Location: [place];  Service: Thoracic;  Laterality: Left;    REVIEW OF SYSTEMS:  A comprehensive review of systems was negative.   PHYSICAL EXAMINATION: General appearance: alert, cooperative and no distress Head: Normocephalic, without obvious abnormality, atraumatic Neck: no adenopathy, no JVD, supple, symmetrical, trachea midline and thyroid not enlarged, symmetric, no tenderness/mass/nodules Lymph nodes: Cervical, supraclavicular, and axillary nodes normal. Resp: clear to auscultation bilaterally Back: symmetric, no curvature. ROM normal. No CVA tenderness. Cardio: regular rate and rhythm, S1, S2 normal, no murmur, click, rub or gallop GI: soft, non-tender; bowel sounds normal; no masses,  no organomegaly Extremities: extremities normal, atraumatic, no cyanosis or edema Skin: generalized dry skin. Approximately 0.5 cm raised lesion, rough textured with punctate hyperpigmented areas, no bleeding  ECOG PERFORMANCE STATUS: 0 - Asymptomatic  Blood pressure 117/67, pulse 72, temperature 98 F (36.7 C), temperature source Oral, resp. rate 17, height '6\' 2"'$  (1.88 m), weight 207 lb 14.4 oz (94.303 kg), SpO2 100 %.  LABORATORY DATA: Lab Results  Component Value Date   WBC 4.4 08/19/2014   HGB 12.5* 08/19/2014   HCT 37.5* 08/19/2014   MCV 101.1* 08/19/2014   PLT 180 08/19/2014      Chemistry      Component Value Date/Time   NA 141 08/19/2014 0919   NA 140 12/19/2013 0405   K 4.2 08/19/2014 0919   K 4.2 12/19/2013 0405   CL 104 12/19/2013 0405   CO2 23  08/19/2014 0919   CO2 26 12/19/2013 0405   BUN 13.0 08/19/2014 0919   BUN 14 12/19/2013 0405   CREATININE 0.9 08/19/2014 0919   CREATININE 0.87 12/19/2013 0405      Component Value Date/Time   CALCIUM 8.6 08/19/2014 0919   CALCIUM 9.0 12/19/2013 0405   ALKPHOS 88 08/19/2014 0919   ALKPHOS 126* 12/18/2013 1610   AST 21 08/19/2014 0919   AST 17 12/18/2013 1610   ALT 18 08/19/2014 0919   ALT 16 12/18/2013 1610   BILITOT 0.39 08/19/2014 0919   BILITOT 0.4 12/18/2013 1610       RADIOGRAPHIC STUDIES: Ct Chest W Contrast  08/15/2014   CLINICAL DATA:  Lung cancer December of 2014. Chemotherapy and radiation therapy complete. Surgery. Staging. Right upper lobe primary.  EXAM: CT CHEST, ABDOMEN, AND PELVIS WITH CONTRAST  TECHNIQUE: Multidetector CT imaging of the chest, abdomen and pelvis was performed following the standard protocol during bolus administration of intravenous contrast.  CONTRAST:  154m OMNIPAQUE IOHEXOL 300 MG/ML  SOLN  COMPARISON:  06/16/2014  FINDINGS: CT CHEST FINDINGS  Mediastinum/Nodes: No supraclavicular adenopathy. A left-sided Port-A-Cath which terminates at the cavoatrial junction.  Aortic and branch vessel atherosclerosis. Normal heart size, without pericardial effusion. Multivessel coronary artery atherosclerosis. No central pulmonary embolism, on this non-dedicated study.  High left mediastinal node measures 1.2 x 2.3 cm on image 13. Enlarged from 1.2 x 1.8 cm on the prior and 1.2 x 1.7 cm on 04/25/2014.  Other mediastinal nodes are small and similar.  No hilar adenopathy.  Lungs/Pleura: Similar moderate right pleural effusion. Right upper lobectomy. Similar minimal nodularity in the posterior right lung on image 38. Radiation fibrosis in the right perihilar region is unchanged.  Mild centrilobular emphysema.  Clear left lung.  Musculoskeletal: No acute osseous abnormality. Right thoracotomy changes with upper posterior lateral chest wall reconstruction. Presumed sebaceous  cyst about the anterior left chest wall measures 5.0 cm and is chronic.  CT ABDOMEN PELVIS FINDINGS  Hepatobiliary: Normal liver. Multiple gallstones without acute cholecystitis or biliary ductal dilatation.  Pancreas: Normal, without mass or ductal dilatation.  Spleen: Normal  Adrenals/Urinary Tract: Normal adrenal glands. Normal kidneys, without hydronephrosis.  Soft tissue density impressing into the urinary bladder on image 120 is favored to be due to enlarged prostate, and is similar over multiple priors.  Stomach/Bowel: Normal stomach, without wall thickening. Normal colon, appendix, and terminal ileum. Normal small bowel.  Vascular/Lymphatic: Advanced aortic and branch vessel atherosclerosis. No abdominopelvic adenopathy.  Reproductive: Prostatomegaly, mild.  Other: No significant free fluid. No evidence of omental or peritoneal disease. Tiny fat containing left inguinal hernia. A  periumbilical hernia also contains fat.  Musculoskeletal: Mild osteopenia. L4 lucent lesion is similar today, including on image 87.  IMPRESSION: 1. Enlargement of a high left mediastinal node, suspicious for progressive nodal metastasis. 2. Otherwise, similar appearance of the chest, with surgical and radiation changes on the right and moderate right pleural fluid. 3. No evidence of abdominal pelvic metastatic disease. 4. Cholelithiasis. 5. 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 Recommend attention on follow-up.   Electronically Signed   By: Abigail Miyamoto M.D.   On: 08/15/2014 10:52   Ct Abdomen Pelvis W Contrast  08/15/2014   CLINICAL DATA:  Lung cancer December of 2014. Chemotherapy and radiation therapy complete. Surgery. Staging. Right upper lobe primary.  EXAM: CT CHEST, ABDOMEN, AND PELVIS WITH CONTRAST  TECHNIQUE: Multidetector CT imaging of the chest, abdomen and pelvis was performed following the standard protocol during bolus administration of intravenous contrast.  CONTRAST:  168m  OMNIPAQUE IOHEXOL 300 MG/ML  SOLN  COMPARISON:  06/16/2014  FINDINGS: CT CHEST FINDINGS  Mediastinum/Nodes: No supraclavicular adenopathy. A left-sided Port-A-Cath which terminates at the cavoatrial junction.  Aortic and branch vessel atherosclerosis. Normal heart size, without pericardial effusion. Multivessel coronary artery atherosclerosis. No central pulmonary embolism, on this non-dedicated study.  High left mediastinal node measures 1.2 x 2.3 cm on image 13. Enlarged from 1.2 x 1.8 cm on the prior and 1.2 x 1.7 cm on 04/25/2014.  Other mediastinal nodes are small and similar.  No hilar adenopathy.  Lungs/Pleura: Similar moderate right pleural effusion. Right upper lobectomy. Similar minimal nodularity in the posterior right lung on image 38. Radiation fibrosis in the right perihilar region is unchanged.  Mild centrilobular emphysema.  Clear left lung.  Musculoskeletal: No acute osseous abnormality. Right thoracotomy changes with upper posterior lateral chest wall reconstruction. Presumed sebaceous cyst about the anterior left chest wall measures 5.0 cm and is chronic.  CT ABDOMEN PELVIS FINDINGS  Hepatobiliary: Normal liver. Multiple gallstones without acute cholecystitis or biliary ductal dilatation.  Pancreas: Normal, without mass or ductal dilatation.  Spleen: Normal  Adrenals/Urinary Tract: Normal adrenal glands. Normal kidneys, without hydronephrosis.  Soft tissue density impressing into the urinary bladder on image 120 is favored to be due to enlarged prostate, and is similar over multiple priors.  Stomach/Bowel: Normal stomach, without wall thickening. Normal colon, appendix, and terminal ileum. Normal small bowel.  Vascular/Lymphatic: Advanced aortic and branch vessel atherosclerosis. No abdominopelvic adenopathy.  Reproductive: Prostatomegaly, mild.  Other: No significant free fluid. No evidence of omental or peritoneal disease. Tiny fat containing left inguinal hernia. A periumbilical hernia also  contains fat.  Musculoskeletal: Mild osteopenia. L4 lucent lesion is similar today, including on image 87.  IMPRESSION: 1. Enlargement of a high left mediastinal node, suspicious for progressive nodal metastasis. 2. Otherwise, similar appearance of the chest, with surgical and radiation changes on the right and moderate right pleural fluid. 3. No evidence of abdominal pelvic metastatic disease. 4. Cholelithiasis. 5. 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 Recommend attention on follow-up.   Electronically Signed   By: KAbigail MiyamotoM.D.   On: 08/15/2014 10:52    ASSESSMENT AND PLAN: This is a very pleasant 74years old white male with metastatic non-small cell lung cancer, squamous cell carcinoma. He is currently undergoing treatment with immunotherapy with Nivolumab status post 16 cycles and tolerating his treatment fairly well. The recent restaging CT scan revealed mild enlargement in the high left mediastinal node, the  remainder of the CT scan was otherwise stable. The patient was discussed with and also seen by Dr. Julien Nordmann. He will proceed with cycle #17 today as scheduled. He will follow up in 2 weeks prior to cycle #18 He was advised to call immediately if he has any concerning symptoms in the interval. The patient voices understanding of current disease status and treatment options and is in agreement with the current care plan.  All questions were answered. The patient knows to call the clinic with any problems, questions or concerns. We can certainly see the patient much sooner if necessary.  Carlton Adam, PA-C 08/19/2014  ADDENDUM: Hematology/Oncology Attending: I had a face to face encounter with the patient. I recommended his care plan.  This is a very pleasant 74 years old white male with metastatic non-small cell lung cancer, squamous cell carcinoma. He is currently undergoing treatment with immunotherapy with Nivolumab status post 16 cycles. The  patient is tolerating his treatment well. He denied having any significant nausea or vomiting, no diarrhea. He has no fever or chills. The patient denied having any significant chest pain or shortness of breath. His recent CT scan of the chest showed stable disease except for a slightly enlarging high left mediastinal lymph node. I discussed the scan results with the patient today. The patient is currently asymptomatic and has disease progression is very mild on immunotherapy. I am the option of continuing treatment with immunotherapy and close monitoring on the upcoming his scan versus switching to systemic chemotherapy with the result docetaxel and Cyramza or gemcitabine. The patient would like to continue his treatment with immunotherapy for now. He will proceed with cycle #17 today as a scheduled. He would come back for follow-up visit in 2 weeks for reevaluation with the start of the next cycle of his immunotherapy. The patient was advised to call immediately if he has any concerning symptoms in the interval.  Disclaimer: This note was dictated with voice recognition software. Similar sounding words can inadvertently be transcribed and may be missed upon review. [name]., MD 08/25/2014

## 2014-08-22 ENCOUNTER — Encounter: Payer: Self-pay | Admitting: Physician Assistant

## 2014-08-22 ENCOUNTER — Telehealth: Payer: Self-pay | Admitting: Internal Medicine

## 2014-08-22 ENCOUNTER — Other Ambulatory Visit: Payer: Self-pay | Admitting: Physician Assistant

## 2014-08-22 ENCOUNTER — Telehealth: Payer: Self-pay | Admitting: *Deleted

## 2014-08-22 NOTE — Telephone Encounter (Signed)
Called and informed patient that schedulers should be calling him with an appointment for 6/7 as soon as they find a time that best fits with his schedule.  Patient verbalized understanding.

## 2014-08-22 NOTE — Telephone Encounter (Signed)
s.w. pt and advised on June appt....pt ok and aware he says he will ck my chart

## 2014-08-23 NOTE — Patient Instructions (Signed)
Your CT scan showed mild enlargement in one lymph node. We will monitor this area closely on subsequent imaging studies Follow up in 2 weeks, prior to your next scheduled cycle of immunotherapy

## 2014-09-01 ENCOUNTER — Telehealth: Payer: Self-pay | Admitting: *Deleted

## 2014-09-01 NOTE — Telephone Encounter (Signed)
Pt called with c/o shortness of breath, " it's not really bad unless I walk a long distance", abdominal pain " I'm not having any today but this blue card that I have says if you have had any of these symptoms to call. I was having some abdominal pain but it doesn't really help. I'm not in in pain right now." Pt also complained of dark color urine, having loose bowel, takes colace and miralax. Pt denies fever, n/v bleeding, headaches.  Pt advised "all this started after my CT scan" ( 5/20)  Pt has an lab/flush/ Lattie Haw, NP/ infusion appt 6/7.  Discussed with pt his concerns will be forward to provider for review, if continues to have shortness of breath go to ED. Pt verbalized understanding. No further concerns.

## 2014-09-02 ENCOUNTER — Ambulatory Visit (HOSPITAL_BASED_OUTPATIENT_CLINIC_OR_DEPARTMENT_OTHER): Payer: Medicare Other | Admitting: Nurse Practitioner

## 2014-09-02 ENCOUNTER — Encounter: Payer: Self-pay | Admitting: Nurse Practitioner

## 2014-09-02 ENCOUNTER — Telehealth: Payer: Self-pay | Admitting: Nurse Practitioner

## 2014-09-02 ENCOUNTER — Ambulatory Visit: Payer: Medicare Other

## 2014-09-02 ENCOUNTER — Other Ambulatory Visit (HOSPITAL_BASED_OUTPATIENT_CLINIC_OR_DEPARTMENT_OTHER): Payer: Medicare Other

## 2014-09-02 ENCOUNTER — Other Ambulatory Visit: Payer: Medicare Other

## 2014-09-02 VITALS — BP 112/64 | HR 87 | Temp 98.1°F | Resp 18 | Ht 74.0 in | Wt 202.6 lb

## 2014-09-02 DIAGNOSIS — C3411 Malignant neoplasm of upper lobe, right bronchus or lung: Secondary | ICD-10-CM

## 2014-09-02 DIAGNOSIS — Z95828 Presence of other vascular implants and grafts: Secondary | ICD-10-CM

## 2014-09-02 DIAGNOSIS — R7989 Other specified abnormal findings of blood chemistry: Secondary | ICD-10-CM

## 2014-09-02 DIAGNOSIS — Z5112 Encounter for antineoplastic immunotherapy: Secondary | ICD-10-CM | POA: Insufficient documentation

## 2014-09-02 DIAGNOSIS — R945 Abnormal results of liver function studies: Secondary | ICD-10-CM | POA: Diagnosis not present

## 2014-09-02 DIAGNOSIS — R21 Rash and other nonspecific skin eruption: Secondary | ICD-10-CM

## 2014-09-02 DIAGNOSIS — C3492 Malignant neoplasm of unspecified part of left bronchus or lung: Secondary | ICD-10-CM

## 2014-09-02 LAB — CBC WITH DIFFERENTIAL/PLATELET
BASO%: 0.2 % (ref 0.0–2.0)
Basophils Absolute: 0 10*3/uL (ref 0.0–0.1)
EOS%: 2.3 % (ref 0.0–7.0)
Eosinophils Absolute: 0.1 10*3/uL (ref 0.0–0.5)
HCT: 37.5 % — ABNORMAL LOW (ref 38.4–49.9)
HEMOGLOBIN: 12.7 g/dL — AB (ref 13.0–17.1)
LYMPH%: 10 % — AB (ref 14.0–49.0)
MCH: 33.7 pg — ABNORMAL HIGH (ref 27.2–33.4)
MCHC: 33.9 g/dL (ref 32.0–36.0)
MCV: 99.5 fL — ABNORMAL HIGH (ref 79.3–98.0)
MONO#: 0.7 10*3/uL (ref 0.1–0.9)
MONO%: 14.2 % — AB (ref 0.0–14.0)
NEUT#: 3.5 10*3/uL (ref 1.5–6.5)
NEUT%: 73.3 % (ref 39.0–75.0)
PLATELETS: 210 10*3/uL (ref 140–400)
RBC: 3.77 10*6/uL — ABNORMAL LOW (ref 4.20–5.82)
RDW: 13.7 % (ref 11.0–14.6)
WBC: 4.8 10*3/uL (ref 4.0–10.3)
lymph#: 0.5 10*3/uL — ABNORMAL LOW (ref 0.9–3.3)

## 2014-09-02 LAB — COMPREHENSIVE METABOLIC PANEL (CC13)
ALBUMIN: 2.9 g/dL — AB (ref 3.5–5.0)
ALT: 314 U/L — AB (ref 0–55)
AST: 211 U/L (ref 5–34)
Alkaline Phosphatase: 569 U/L — ABNORMAL HIGH (ref 40–150)
Anion Gap: 9 mEq/L (ref 3–11)
BUN: 11.9 mg/dL (ref 7.0–26.0)
CALCIUM: 9.1 mg/dL (ref 8.4–10.4)
CHLORIDE: 104 meq/L (ref 98–109)
CO2: 26 mEq/L (ref 22–29)
Creatinine: 0.9 mg/dL (ref 0.7–1.3)
EGFR: 86 mL/min/{1.73_m2} — ABNORMAL LOW (ref >90)
GLUCOSE: 133 mg/dL (ref 70–140)
Potassium: 3.7 mEq/L (ref 3.5–5.1)
SODIUM: 139 meq/L (ref 136–145)
TOTAL PROTEIN: 6.6 g/dL (ref 6.4–8.3)
Total Bilirubin: 2.64 mg/dL — ABNORMAL HIGH (ref 0.20–1.20)

## 2014-09-02 MED ORDER — HEPARIN SOD (PORK) LOCK FLUSH 100 UNIT/ML IV SOLN
500.0000 [IU] | Freq: Once | INTRAVENOUS | Status: AC
  Administered 2014-09-02: 500 [IU] via INTRAVENOUS
  Filled 2014-09-02: qty 5

## 2014-09-02 MED ORDER — SODIUM CHLORIDE 0.9 % IJ SOLN
10.0000 mL | INTRAMUSCULAR | Status: DC | PRN
  Administered 2014-09-02: 10 mL via INTRAVENOUS
  Filled 2014-09-02: qty 10

## 2014-09-02 MED ORDER — HEPARIN SOD (PORK) LOCK FLUSH 100 UNIT/ML IV SOLN
500.0000 [IU] | Freq: Once | INTRAVENOUS | Status: DC
  Filled 2014-09-02: qty 5

## 2014-09-02 NOTE — Patient Instructions (Signed)

## 2014-09-02 NOTE — Telephone Encounter (Signed)
per pof to sch pt appt-gave pt copy of avs °

## 2014-09-02 NOTE — Progress Notes (Addendum)
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  Primary site: Lung (Right)  Staging method: AJCC 7th Edition  Clinical: Stage IIIA (T3, N2, M0) signed by Curt Bears, MD on 05/09/2013 2:56 PM  Summary: Stage IIIA (T3, N2, M0)  PRIOR THERAPY:  1) Concurrent chemoradiation with weekly carboplatin for an AUC of 2 and paclitaxel 45 mg per meter squared. Status post 5 cycles. 2) Bronchoscopy, mediastinoscopy, right posterior lateral thoracotomy with right upper lobectomy, lymph node dissection,  and posterior approach to en bloc chest wall resection, ribs 2, 3, and 4, with positive bronchial resection margin. 3) palliative radiotherapy to the bronchial resection margin under the care of Dr. Tammi Klippel completed on 10/14/2013. 4) systemic chemotherapy with carboplatin for an AUC of 5 given on day 1 and Abraxane 100 mg/m given days 1, 8 and 15 every 3 weeks status post 3 cycles discontinued 12/31/2013 secondary to disease progression  CURRENT THERAPY: Immunotherapy with Nivolumab 3 mg/kg given every 2 weeks. Status post 17 cycles  INTERVAL HISTORY:   Terry Robles returns as scheduled. He completed cycle 17 nivolumab 08/19/2014. He is having intermittent nausea. No vomiting. No diarrhea. Since the most recent CT scan he has noted a decreased volume of stool and more constipation-like symptoms. No mouth sores. He has been experiencing "reflux" at bedtime for the past few nights. He had an episode of epigastric pain yesterday which resolved with position change and TUMS. He reports his appetite is poor. He has lost some weight. The rash over his back is stable. He reports seeing dermatology and being diagnosed with "eczema". He is applying a steroid cream. He has no significant shortness of breath at rest. He  notes increased dyspnea on exertion over the past week. He periodically coughs. No fever.  Objective:  Vital signs in last 24 hours:  Blood pressure 112/64, pulse 87, temperature 98.1 F (36.7 C), temperature source Oral, resp. rate 18, height '6\' 2"'$  (1.88 m), weight 202 lb 9.6 oz (91.899 kg), SpO2 98 %.    HEENT: No thrush or ulcers. Sclera anicteric. Lymphatics: No palpable cervical or supraclavicular lymph nodes. Resp: Distant breath sounds. Cardio: Distant heart sounds. Regular rate and rhythm. GI: Abdomen soft and nontender. No hepatomegaly. Vascular: No leg edema. Calves soft and nontender. Neuro: Alert and oriented.  Skin: Scattered erythematous macular lesions over the low back. Approximate half centimeter scabbed lesion at the right low back.  Port-A-Cath without erythema.   Lab Results:  Lab Results  Component Value Date   WBC 4.8 09/02/2014   HGB 12.7* 09/02/2014   HCT 37.5* 09/02/2014   MCV 99.5* 09/02/2014   PLT 210 09/02/2014   NEUTROABS 3.5 09/02/2014    Imaging:  No results found.  Medications: I have reviewed the patient's current medications.  Assessment/Plan: 1. Metastatic non-small cell lung cancer, squamous cell carcinoma, currently on active treatment with nivolumab. He has completed 17 cycles. 2. Abnormal LFTs 09/02/2014. 3. Lower back rash.   Disposition: Terry Robles has completed 17 cycles of nivolumab. Liver function tests are newly elevated on labs today. We are placing nivolumab on hold. He will return for a repeat chemistry panel on 09/05/2014. If there is no improvement or the LFTs have increased the plan is to begin steroids.  The skin rash may be related to nivolumab as well.  Terry Robles  will return for a follow-up visit in one week. He will contact the office in the interim with any problems. We specifically discussed onset of jaundice.  Patient seen with Dr. Julien Nordmann.  Ned Card ANP/GNP-BC   09/02/2014  3:02  PM  ADDENDUM: Hematology/Oncology Attending: I had a face to face encounter with the patient today. I recommended his care plan. This is a very pleasant 74 years old white male with metastatic non-small cell lung cancer, squamous cell carcinoma who is currently undergoing treatment with immunotherapy with Nivolumab status post 17 cycles of his treatment and has been tolerating his treatment fairly well. Unfortunately over the last 7-10 days the patient has been complaining of increasing fatigue and weakness as well as dark urine. Repeat comprehensive metabolic panel today showed significant elevation of his liver enzymes concerning for immune mediated hepatitis but other etiologies cannot be excluded at this point. I discussed with the patient several options for management of his condition including a starting high-dose is towards versus repeating his liver enzymes and few days before starting the Steroids versus repeating CT scan of the abdomen pelvis to rule out any disease progression. The patient agreed to close monitoring of his liver enzymes for now but he will report immediately to [place] if he has any worsening of his symptoms or increase of jaundice. We will see him back for follow-up visit in one week for reevaluation. He was advised to call immediately if he has any other concerning symptoms in the interval.  Disclaimer: This note was dictated with voice recognition software. Similar sounding words can inadvertently be transcribed and may be missed upon review. [name]., MD 09/02/2014

## 2014-09-05 ENCOUNTER — Telehealth: Payer: Self-pay | Admitting: *Deleted

## 2014-09-05 ENCOUNTER — Other Ambulatory Visit (HOSPITAL_BASED_OUTPATIENT_CLINIC_OR_DEPARTMENT_OTHER): Payer: Medicare Other

## 2014-09-05 DIAGNOSIS — C3411 Malignant neoplasm of upper lobe, right bronchus or lung: Secondary | ICD-10-CM

## 2014-09-05 DIAGNOSIS — R945 Abnormal results of liver function studies: Secondary | ICD-10-CM

## 2014-09-05 DIAGNOSIS — R7989 Other specified abnormal findings of blood chemistry: Secondary | ICD-10-CM

## 2014-09-05 LAB — COMPREHENSIVE METABOLIC PANEL (CC13)
ALBUMIN: 2.9 g/dL — AB (ref 3.5–5.0)
ALK PHOS: 407 U/L — AB (ref 40–150)
ALT: 122 U/L — AB (ref 0–55)
AST: 34 U/L (ref 5–34)
Anion Gap: 9 mEq/L (ref 3–11)
BILIRUBIN TOTAL: 0.9 mg/dL (ref 0.20–1.20)
BUN: 12.2 mg/dL (ref 7.0–26.0)
CALCIUM: 9 mg/dL (ref 8.4–10.4)
CO2: 24 mEq/L (ref 22–29)
Chloride: 104 mEq/L (ref 98–109)
Creatinine: 0.9 mg/dL (ref 0.7–1.3)
EGFR: 84 mL/min/{1.73_m2} — AB (ref >90)
GLUCOSE: 119 mg/dL (ref 70–140)
Potassium: 4.1 mEq/L (ref 3.5–5.1)
SODIUM: 136 meq/L (ref 136–145)
Total Protein: 6.7 g/dL (ref 6.4–8.3)

## 2014-09-05 NOTE — Telephone Encounter (Signed)
Called and informed patient's wife Remo Lipps) that liver enzymes are better and for patient to follow up as scheduled.  Patient's wife stated she would inform husband.  Per Elby Showers. Marcello Moores, NP.

## 2014-09-07 ENCOUNTER — Encounter: Payer: Self-pay | Admitting: Nurse Practitioner

## 2014-09-09 ENCOUNTER — Ambulatory Visit (HOSPITAL_BASED_OUTPATIENT_CLINIC_OR_DEPARTMENT_OTHER): Payer: Medicare Other

## 2014-09-09 ENCOUNTER — Telehealth: Payer: Self-pay | Admitting: Nurse Practitioner

## 2014-09-09 ENCOUNTER — Ambulatory Visit (HOSPITAL_BASED_OUTPATIENT_CLINIC_OR_DEPARTMENT_OTHER): Payer: Medicare Other | Admitting: Nurse Practitioner

## 2014-09-09 VITALS — BP 114/55 | HR 88 | Temp 97.7°F | Resp 18 | Ht 74.0 in | Wt 203.0 lb

## 2014-09-09 DIAGNOSIS — R945 Abnormal results of liver function studies: Secondary | ICD-10-CM

## 2014-09-09 DIAGNOSIS — C3411 Malignant neoplasm of upper lobe, right bronchus or lung: Secondary | ICD-10-CM | POA: Diagnosis present

## 2014-09-09 DIAGNOSIS — R7989 Other specified abnormal findings of blood chemistry: Secondary | ICD-10-CM

## 2014-09-09 LAB — CBC WITH DIFFERENTIAL/PLATELET
BASO%: 0.2 % (ref 0.0–2.0)
BASOS ABS: 0 10*3/uL (ref 0.0–0.1)
EOS%: 3.9 % (ref 0.0–7.0)
Eosinophils Absolute: 0.2 10*3/uL (ref 0.0–0.5)
HCT: 37.7 % — ABNORMAL LOW (ref 38.4–49.9)
HEMOGLOBIN: 12.5 g/dL — AB (ref 13.0–17.1)
LYMPH#: 0.7 10*3/uL — AB (ref 0.9–3.3)
LYMPH%: 12.3 % — ABNORMAL LOW (ref 14.0–49.0)
MCH: 33.3 pg (ref 27.2–33.4)
MCHC: 33.2 g/dL (ref 32.0–36.0)
MCV: 100.5 fL — AB (ref 79.3–98.0)
MONO#: 0.7 10*3/uL (ref 0.1–0.9)
MONO%: 12.1 % (ref 0.0–14.0)
NEUT%: 71.5 % (ref 39.0–75.0)
NEUTROS ABS: 4 10*3/uL (ref 1.5–6.5)
Platelets: 252 10*3/uL (ref 140–400)
RBC: 3.75 10*6/uL — AB (ref 4.20–5.82)
RDW: 13.3 % (ref 11.0–14.6)
WBC: 5.6 10*3/uL (ref 4.0–10.3)

## 2014-09-09 LAB — COMPREHENSIVE METABOLIC PANEL (CC13)
ALBUMIN: 2.9 g/dL — AB (ref 3.5–5.0)
ALT: 37 U/L (ref 0–55)
ANION GAP: 10 meq/L (ref 3–11)
AST: 14 U/L (ref 5–34)
Alkaline Phosphatase: 262 U/L — ABNORMAL HIGH (ref 40–150)
BUN: 14.3 mg/dL (ref 7.0–26.0)
CHLORIDE: 103 meq/L (ref 98–109)
CO2: 24 mEq/L (ref 22–29)
CREATININE: 1 mg/dL (ref 0.7–1.3)
Calcium: 9 mg/dL (ref 8.4–10.4)
EGFR: 71 mL/min/{1.73_m2} — ABNORMAL LOW (ref >90)
Glucose: 110 mg/dl (ref 70–140)
POTASSIUM: 4.2 meq/L (ref 3.5–5.1)
Sodium: 138 mEq/L (ref 136–145)
Total Bilirubin: 0.65 mg/dL (ref 0.20–1.20)
Total Protein: 6.7 g/dL (ref 6.4–8.3)

## 2014-09-09 MED ORDER — HYDROCODONE-HOMATROPINE 5-1.5 MG/5ML PO SYRP: 5.0000 mL | ORAL_SOLUTION | Freq: Four times a day (QID) | ORAL | Status: DC | PRN

## 2014-09-09 MED ORDER — OXYCODONE-ACETAMINOPHEN 5-325 MG PO TABS: 1.0000 | ORAL_TABLET | Freq: Four times a day (QID) | ORAL | Status: DC | PRN

## 2014-09-09 NOTE — Telephone Encounter (Signed)
per pof to sch pt appt-Terry Robles changed inf-pt aware of sch-pt hs MY CHART will review sch-ran avs

## 2014-09-09 NOTE — Progress Notes (Addendum)
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  Primary site: Lung (Right)  Staging method: AJCC 7th Edition  Clinical: Stage IIIA (T3, N2, M0) signed by Curt Bears, MD on 05/09/2013 2:56 PM  Summary: Stage IIIA (T3, N2, M0)  PRIOR THERAPY:  1) Concurrent chemoradiation with weekly carboplatin for an AUC of 2 and paclitaxel 45 mg per meter squared. Status post 5 cycles. 2) Bronchoscopy, mediastinoscopy, right posterior lateral thoracotomy with right upper lobectomy, lymph node dissection,  and posterior approach to en bloc chest wall resection, ribs 2, 3, and 4, with positive bronchial resection margin. 3) palliative radiotherapy to the bronchial resection margin under the care of Dr. Tammi Klippel completed on 10/14/2013. 4) systemic chemotherapy with carboplatin for an AUC of 5 given on day 1 and Abraxane 100 mg/m given days 1, 8 and 15 every 3 weeks status post 3 cycles discontinued 12/31/2013 secondary to disease progression  CURRENT THERAPY: Immunotherapy with Nivolumab 3 mg/kg given every 2 weeks. Status post 17 cycles. Cycle 18 was held 09/02/2014 due to elevated liver enzymes.  INTERVAL HISTORY:   Terry Robles returns as scheduled. He is feeling some better. The reflux symptoms have resolved. His urine is lighter. He has stable mild dyspnea on exertion. He has stable chronic right shoulder pain. Appetite is better.  Objective:  Vital signs in last 24 hours:  Blood pressure 114/55, pulse 88, temperature 97.7 F (36.5 C), temperature source Oral, resp. rate 18, height '6\' 2"'$  (1.88 m), weight 203 lb (92.08 kg), SpO2 97 %.    HEENT: No thrush or ulcers. Lymphatics: No palpable cervical or supra-clavicular lymph nodes. Resp: Distant breath sounds. Cardio: Regular rate and  rhythm. GI: Abdomen soft and nontender. No hepatomegaly. Vascular: No leg edema. Calves soft and nontender. Neuro: Alert and oriented. Motor strength 5 over 5.     Lab Results:  Lab Results  Component Value Date   WBC 4.8 09/02/2014   HGB 12.7* 09/02/2014   HCT 37.5* 09/02/2014   MCV 99.5* 09/02/2014   PLT 210 09/02/2014   NEUTROABS 3.5 09/02/2014    Imaging:  No results found.  Medications: I have reviewed the patient's current medications.  Assessment/Plan: 1. Metastatic non-small cell lung cancer, squamous cell carcinoma, currently on active treatment with nivolumab. He has completed 17 cycles. Cycle 18 held on 09/02/2014 due to elevated liver enzymes. 2. Abnormal LFTs 09/02/2014. Improved 09/05/2014.  Disposition: Terry Robles appears stable. He has completed 17 cycles of nivolumab. Cycle 18 was held one week ago due to elevated liver enzymes. Liver enzymes were improved on 09/05/2014. We are obtaining repeat labs today and will contact him with the result. He will return for a follow-up visit and possible resumption of nivolumab in one week.  Patient seen with Dr. Julien Nordmann.    Ned Card ANP/GNP-BC   09/09/2014  11:23 AM  ADDENDUM: Hematology/Oncology Attending: I had a face to face encounter with the patient. I recommended his care plan. This is a very pleasant 74 years old white male with metastatic non-small cell lung cancer, squamous cell carcinoma who is currently undergoing treatment with immunotherapy with Nivolumab status post 17 cycles. He was supposed to start cycle #18 last week but unfortunately has significant increase in his liver enzymes. His treatment is currently on hold and repeat liver enzymes today showed significant  improvement in his condition. We will plan to resume his treatment next week. He would come back for follow-up visit at that time with repeat CBC and comprehensive metabolic panel for evaluation of his liver function before starting the  treatment. He was advised to call immediately if he has any concerning symptoms in the interval.  Disclaimer: This note was dictated with voice recognition software. Similar sounding words can inadvertently be transcribed and may not be corrected upon review. [name]., MD 09/10/2014

## 2014-09-16 ENCOUNTER — Telehealth: Payer: Self-pay | Admitting: Internal Medicine

## 2014-09-16 ENCOUNTER — Other Ambulatory Visit: Payer: Medicare Other

## 2014-09-16 ENCOUNTER — Ambulatory Visit: Payer: Medicare Other

## 2014-09-16 ENCOUNTER — Encounter: Payer: Medicare Other | Admitting: Nutrition

## 2014-09-16 ENCOUNTER — Encounter: Payer: Self-pay | Admitting: Nurse Practitioner

## 2014-09-16 ENCOUNTER — Ambulatory Visit (HOSPITAL_BASED_OUTPATIENT_CLINIC_OR_DEPARTMENT_OTHER): Payer: Medicare Other

## 2014-09-16 ENCOUNTER — Ambulatory Visit: Payer: Medicare Other | Admitting: Nutrition

## 2014-09-16 ENCOUNTER — Ambulatory Visit: Payer: Medicare Other | Admitting: Nurse Practitioner

## 2014-09-16 ENCOUNTER — Other Ambulatory Visit (HOSPITAL_BASED_OUTPATIENT_CLINIC_OR_DEPARTMENT_OTHER): Payer: Medicare Other

## 2014-09-16 ENCOUNTER — Ambulatory Visit (HOSPITAL_BASED_OUTPATIENT_CLINIC_OR_DEPARTMENT_OTHER): Payer: Medicare Other | Admitting: Nurse Practitioner

## 2014-09-16 VITALS — BP 115/64 | HR 97 | Temp 98.2°F | Resp 18 | Ht 74.0 in | Wt 203.6 lb

## 2014-09-16 DIAGNOSIS — C3492 Malignant neoplasm of unspecified part of left bronchus or lung: Secondary | ICD-10-CM

## 2014-09-16 DIAGNOSIS — C3411 Malignant neoplasm of upper lobe, right bronchus or lung: Secondary | ICD-10-CM

## 2014-09-16 DIAGNOSIS — Z95828 Presence of other vascular implants and grafts: Secondary | ICD-10-CM

## 2014-09-16 DIAGNOSIS — Z5112 Encounter for antineoplastic immunotherapy: Secondary | ICD-10-CM | POA: Diagnosis present

## 2014-09-16 LAB — CBC WITH DIFFERENTIAL/PLATELET
BASO%: 0.8 % (ref 0.0–2.0)
BASOS ABS: 0 10*3/uL (ref 0.0–0.1)
EOS%: 3.3 % (ref 0.0–7.0)
Eosinophils Absolute: 0.2 10*3/uL (ref 0.0–0.5)
HCT: 34.3 % — ABNORMAL LOW (ref 38.4–49.9)
HEMOGLOBIN: 11.7 g/dL — AB (ref 13.0–17.1)
LYMPH#: 0.5 10*3/uL — AB (ref 0.9–3.3)
LYMPH%: 8.7 % — ABNORMAL LOW (ref 14.0–49.0)
MCH: 33.2 pg (ref 27.2–33.4)
MCHC: 34 g/dL (ref 32.0–36.0)
MCV: 97.7 fL (ref 79.3–98.0)
MONO#: 0.7 10*3/uL (ref 0.1–0.9)
MONO%: 12.5 % (ref 0.0–14.0)
NEUT#: 4.1 10*3/uL (ref 1.5–6.5)
NEUT%: 74.7 % (ref 39.0–75.0)
Platelets: 295 10*3/uL (ref 140–400)
RBC: 3.51 10*6/uL — AB (ref 4.20–5.82)
RDW: 12.8 % (ref 11.0–14.6)
WBC: 5.5 10*3/uL (ref 4.0–10.3)

## 2014-09-16 LAB — COMPREHENSIVE METABOLIC PANEL (CC13)
ALBUMIN: 2.8 g/dL — AB (ref 3.5–5.0)
ALT: 11 U/L (ref 0–55)
AST: 11 U/L (ref 5–34)
Alkaline Phosphatase: 150 U/L (ref 40–150)
Anion Gap: 5 mEq/L (ref 3–11)
BUN: 13.9 mg/dL (ref 7.0–26.0)
CALCIUM: 9.1 mg/dL (ref 8.4–10.4)
CO2: 29 mEq/L (ref 22–29)
Chloride: 104 mEq/L (ref 98–109)
Creatinine: 0.8 mg/dL (ref 0.7–1.3)
EGFR: 89 mL/min/{1.73_m2} — AB (ref >90)
GLUCOSE: 90 mg/dL (ref 70–140)
POTASSIUM: 4.3 meq/L (ref 3.5–5.1)
SODIUM: 137 meq/L (ref 136–145)
TOTAL PROTEIN: 6.5 g/dL (ref 6.4–8.3)
Total Bilirubin: 0.55 mg/dL (ref 0.20–1.20)

## 2014-09-16 MED ORDER — SODIUM CHLORIDE 0.9 % IJ SOLN
10.0000 mL | INTRAMUSCULAR | Status: DC | PRN
  Administered 2014-09-16: 10 mL
  Filled 2014-09-16: qty 10

## 2014-09-16 MED ORDER — SODIUM CHLORIDE 0.9 % IV SOLN
Freq: Once | INTRAVENOUS | Status: AC
  Administered 2014-09-16: 15:00:00 via INTRAVENOUS

## 2014-09-16 MED ORDER — SODIUM CHLORIDE 0.9 % IJ SOLN
10.0000 mL | INTRAMUSCULAR | Status: DC | PRN
  Administered 2014-09-16: 10 mL via INTRAVENOUS
  Filled 2014-09-16: qty 10

## 2014-09-16 MED ORDER — SODIUM CHLORIDE 0.9 % IV SOLN
3.0000 mg/kg | Freq: Once | INTRAVENOUS | Status: AC
  Administered 2014-09-16: 280 mg via INTRAVENOUS
  Filled 2014-09-16: qty 28

## 2014-09-16 MED ORDER — HEPARIN SOD (PORK) LOCK FLUSH 100 UNIT/ML IV SOLN
500.0000 [IU] | Freq: Once | INTRAVENOUS | Status: AC | PRN
  Administered 2014-09-16: 500 [IU]
  Filled 2014-09-16: qty 5

## 2014-09-16 NOTE — Patient Instructions (Signed)

## 2014-09-16 NOTE — Patient Instructions (Signed)
[facility] Discharge Instructions for Patients  Today you received the following: Nivolumab   To help prevent nausea and vomiting after your treatment, we encourage you to take your nausea medication as directed.    If you develop nausea and vomiting that is not controlled by your nausea medication, call the clinic.   BELOW ARE SYMPTOMS THAT SHOULD BE REPORTED IMMEDIATELY:  *FEVER GREATER THAN 100.5 F  *CHILLS WITH OR WITHOUT FEVER  NAUSEA AND VOMITING THAT IS NOT CONTROLLED WITH YOUR NAUSEA MEDICATION  *UNUSUAL SHORTNESS OF BREATH  *UNUSUAL BRUISING OR BLEEDING  TENDERNESS IN MOUTH AND THROAT WITH OR WITHOUT PRESENCE OF ULCERS  *URINARY PROBLEMS  *BOWEL PROBLEMS  UNUSUAL RASH Items with * indicate a potential emergency and should be followed up as soon as possible.  Feel free to call the clinic you have any questions or concerns. The clinic phone number is [phone].  Please show the CHEMO ALERT CARD at check-in to the Emergency Department and triage nurse.   

## 2014-09-16 NOTE — Progress Notes (Addendum)
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  Primary site: Lung (Right)  Staging method: AJCC 7th Edition  Clinical: Stage IIIA (T3, N2, M0) signed by Curt Bears, MD on 05/09/2013 2:56 PM  Summary: Stage IIIA (T3, N2, M0)  PRIOR THERAPY:  1) Concurrent chemoradiation with weekly carboplatin for an AUC of 2 and paclitaxel 45 mg per meter squared. Status post 5 cycles. 2) Bronchoscopy, mediastinoscopy, right posterior lateral thoracotomy with right upper lobectomy, lymph node dissection,  and posterior approach to en bloc chest wall resection, ribs 2, 3, and 4, with positive bronchial resection margin. 3) palliative radiotherapy to the bronchial resection margin under the care of Dr. Tammi Klippel completed on 10/14/2013. 4) systemic chemotherapy with carboplatin for an AUC of 5 given on day 1 and Abraxane 100 mg/m given days 1, 8 and 15 every 3 weeks status post 3 cycles discontinued 12/31/2013 secondary to disease progression  CURRENT THERAPY: Immunotherapy with Nivolumab 3 mg/kg given every 2 weeks. Status post 17 cycles. Cycle 18 was held 09/02/2014 due to elevated liver enzymes. Nivolumab resumed 09/16/2014.  INTERVAL HISTORY:   Terry Robles returns as scheduled. He reports a poor energy level. He notes increased shortness of breath. He thinks this may be due to the hot weather. He has an intermittent cough. He expectorates "clear mucus". No hemoptysis. Appetite is described as "okay". Stable chronic pain involving the right shoulder and both hips.  Objective:  Vital signs in last 24 hours:  Blood pressure 115/64, pulse 97, temperature 98.2 F (36.8 C), temperature source Oral, resp. rate 18, height '6\' 2"'$  (1.88 m), weight 203 lb 9.6 oz (92.352 kg), SpO2 97 %.    HEENT: No thrush or  ulcers. Resp: Lungs are clear. Breath sounds diminished right lower lung field. Cardio: Regular rate and rhythm. GI: Abdomen soft and nontender. No hepatomegaly. Vascular: No leg edema. Calves soft and nontender. Neuro: Alert and oriented. Gait normal.  Skin: No rash. Port-A-Cath without erythema.    Lab Results:  Lab Results  Component Value Date   WBC 5.5 09/16/2014   HGB 11.7* 09/16/2014   HCT 34.3* 09/16/2014   MCV 97.7 09/16/2014   PLT 295 09/16/2014   NEUTROABS 4.1 09/16/2014    Imaging:  No results found.  Medications: I have reviewed the patient's current medications.  Assessment/Plan: 1. Metastatic non-small cell lung cancer, squamous cell carcinoma, currently on active treatment with nivolumab. He has completed 17 cycles. Cycle 18 held on 09/02/2014 due to elevated liver enzymes. 2. Abnormal LFTs 09/02/2014. Improved 09/05/2014, 09/09/2014. Normalized 09/16/2014.   Disposition: Terry Robles appear stable. LFTs have normalized. Dr. Julien Nordmann recommends resuming nivolumab today. We will obtain a repeat chemistry panel in one week. He would like to have that lab done at the [facility]. He will return for a follow-up visit and nivolumab in 2 weeks. He will contact the office in the interim with any problems.  Patient seen with Dr. Julien Nordmann.    Ned Card ANP/GNP-BC   09/16/2014  2:12 PM  ADDENDUM: Hematology/Oncology Attending: I had a face to face encounter with the patient today. I recommended his care plan. This is a very pleasant 74 years old white male with metastatic non-small cell lung cancer, squamous cell carcinoma who is currently undergoing treatment with immunotherapy with Nivolumab status post 17 cycles.  His treatment was on hold secondary to liver dysfunction which significantly improved over the last 2 weeks. I recommended for the patient to resume his treatment with Nivolumab and he will start cycle #18 today. We will continue to monitor  his lab closely on weekly basis and the patient was advised to call immediately if you notice any significant change or symptoms in the interval. He would come back for follow-up visit in 2 weeks for reevaluation before starting cycle #19.   Disclaimer: This note was dictated with voice recognition software. Similar sounding words can inadvertently be transcribed and may be missed upon review. Terry Robles., MD 09/16/2014

## 2014-09-16 NOTE — Progress Notes (Signed)
Patient was identified to be at risk for malnutrition on the MST secondary to weight loss and poor appetite.  74 year old male diagnosed with lung cancer.  He is a patient of Dr. Earlie Server.  Past medical history includes hypercholesterolemia, GERD, pneumonia, shingles, and anemia.  Medications include Colace, multivitamin, MiraLAX, Compazine.  Labs include albumen 2.9.  Height: 6 feet 2 inches. Weight: 203.6 pounds. Usual body weight: 195-205 pounds. BMI: 26.05.  I spoke briefly with patient who reports his appetite is somewhat improved.  Weight has increased. He forces himself to eat. He has resource breeze at home but doesn't enjoy it. States he drinks one every 3 or 4 days.   Denies nutrition impact symptoms.  Nutrition diagnosis: Food and nutrition related knowledge deficit related to lung cancer as evidenced by no prior need for nutrition related information.  Intervention: Educated patient on strategies for improving taste of resource breeze. Recommended patient try [employer] breakfast essentials and provided coupons. Educated to patient on strategies for increased calories and protein and encouraged weight maintenance. Questions were answered and teach back method was used.  Fact sheets were provided.  Contact information was given.  Monitoring, evaluation, goals: Patient will tolerate adequate calories and protein to support weight maintenance.  Next visit: Patient will contact me for questions or concerns.  **Disclaimer: This note was dictated with voice recognition software. Similar sounding words can inadvertently be transcribed and this note may contain transcription errors which may not have been corrected upon publication of note.**

## 2014-09-16 NOTE — Telephone Encounter (Signed)
line busy.....mailed pt appt sched/avs and letter

## 2014-09-23 ENCOUNTER — Encounter (HOSPITAL_COMMUNITY): Payer: Medicare Other | Attending: Hematology & Oncology

## 2014-09-23 ENCOUNTER — Other Ambulatory Visit: Payer: Self-pay | Admitting: Nurse Practitioner

## 2014-09-23 DIAGNOSIS — C3411 Malignant neoplasm of upper lobe, right bronchus or lung: Secondary | ICD-10-CM

## 2014-09-23 DIAGNOSIS — Z87891 Personal history of nicotine dependence: Secondary | ICD-10-CM | POA: Insufficient documentation

## 2014-09-23 LAB — CBC WITH DIFFERENTIAL/PLATELET
Basophils Absolute: 0 10*3/uL (ref 0.0–0.1)
Basophils Relative: 0 % (ref 0–1)
Eosinophils Absolute: 0.2 10*3/uL (ref 0.0–0.7)
Eosinophils Relative: 3 % (ref 0–5)
HCT: 34.3 % — ABNORMAL LOW (ref 39.0–52.0)
HEMOGLOBIN: 11.2 g/dL — AB (ref 13.0–17.0)
LYMPHS ABS: 0.6 10*3/uL — AB (ref 0.7–4.0)
LYMPHS PCT: 10 % — AB (ref 12–46)
MCH: 32.6 pg (ref 26.0–34.0)
MCHC: 32.7 g/dL (ref 30.0–36.0)
MCV: 99.7 fL (ref 78.0–100.0)
MONOS PCT: 10 % (ref 3–12)
Monocytes Absolute: 0.5 10*3/uL (ref 0.1–1.0)
NEUTROS ABS: 4.2 10*3/uL (ref 1.7–7.7)
Neutrophils Relative %: 77 % (ref 43–77)
PLATELETS: 258 10*3/uL (ref 150–400)
RBC: 3.44 MIL/uL — AB (ref 4.22–5.81)
RDW: 13.3 % (ref 11.5–15.5)
WBC: 5.5 10*3/uL (ref 4.0–10.5)

## 2014-09-23 LAB — COMPREHENSIVE METABOLIC PANEL
ALT: 13 U/L — ABNORMAL LOW (ref 17–63)
ANION GAP: 8 (ref 5–15)
AST: 14 U/L — AB (ref 15–41)
Albumin: 3.1 g/dL — ABNORMAL LOW (ref 3.5–5.0)
Alkaline Phosphatase: 124 U/L (ref 38–126)
BUN: 17 mg/dL (ref 6–20)
CHLORIDE: 102 mmol/L (ref 101–111)
CO2: 27 mmol/L (ref 22–32)
CREATININE: 0.94 mg/dL (ref 0.61–1.24)
Calcium: 8.7 mg/dL — ABNORMAL LOW (ref 8.9–10.3)
GFR calc non Af Amer: >60 mL/min (ref >60)
GLUCOSE: 119 mg/dL — AB (ref 65–99)
POTASSIUM: 4.1 mmol/L (ref 3.5–5.1)
Sodium: 137 mmol/L (ref 135–145)
TOTAL PROTEIN: 7 g/dL (ref 6.5–8.1)
Total Bilirubin: 0.6 mg/dL (ref 0.3–1.2)

## 2014-09-23 NOTE — Progress Notes (Signed)
Labs drawn for chcc

## 2014-10-01 ENCOUNTER — Ambulatory Visit (HOSPITAL_BASED_OUTPATIENT_CLINIC_OR_DEPARTMENT_OTHER): Payer: Medicare Other

## 2014-10-01 ENCOUNTER — Ambulatory Visit (HOSPITAL_BASED_OUTPATIENT_CLINIC_OR_DEPARTMENT_OTHER): Payer: Medicare Other | Admitting: Nurse Practitioner

## 2014-10-01 ENCOUNTER — Ambulatory Visit (HOSPITAL_BASED_OUTPATIENT_CLINIC_OR_DEPARTMENT_OTHER): Payer: Medicare Other | Admitting: Lab

## 2014-10-01 ENCOUNTER — Encounter: Payer: Self-pay | Admitting: Nurse Practitioner

## 2014-10-01 VITALS — BP 103/59 | HR 79 | Temp 98.0°F | Resp 18 | Ht 74.0 in | Wt 201.4 lb

## 2014-10-01 DIAGNOSIS — Z5112 Encounter for antineoplastic immunotherapy: Secondary | ICD-10-CM

## 2014-10-01 DIAGNOSIS — C3411 Malignant neoplasm of upper lobe, right bronchus or lung: Secondary | ICD-10-CM | POA: Diagnosis present

## 2014-10-01 LAB — CBC WITH DIFFERENTIAL/PLATELET
BASO%: 0.6 % (ref 0.0–2.0)
BASOS ABS: 0 10*3/uL (ref 0.0–0.1)
EOS%: 4.3 % (ref 0.0–7.0)
Eosinophils Absolute: 0.3 10*3/uL (ref 0.0–0.5)
HCT: 33.9 % — ABNORMAL LOW (ref 38.4–49.9)
HGB: 11.4 g/dL — ABNORMAL LOW (ref 13.0–17.1)
LYMPH%: 9.3 % — ABNORMAL LOW (ref 14.0–49.0)
MCH: 32.4 pg (ref 27.2–33.4)
MCHC: 33.5 g/dL (ref 32.0–36.0)
MCV: 96.6 fL (ref 79.3–98.0)
MONO#: 0.8 10*3/uL (ref 0.1–0.9)
MONO%: 13.9 % (ref 0.0–14.0)
NEUT%: 71.9 % (ref 39.0–75.0)
NEUTROS ABS: 4.3 10*3/uL (ref 1.5–6.5)
Platelets: 329 10*3/uL (ref 140–400)
RBC: 3.51 10*6/uL — AB (ref 4.20–5.82)
RDW: 13 % (ref 11.0–14.6)
WBC: 5.9 10*3/uL (ref 4.0–10.3)
lymph#: 0.6 10*3/uL — ABNORMAL LOW (ref 0.9–3.3)

## 2014-10-01 LAB — COMPREHENSIVE METABOLIC PANEL (CC13)
ALBUMIN: 3 g/dL — AB (ref 3.5–5.0)
ALT: 11 U/L (ref 0–55)
AST: 13 U/L (ref 5–34)
Alkaline Phosphatase: 124 U/L (ref 40–150)
Anion Gap: 9 mEq/L (ref 3–11)
BILIRUBIN TOTAL: 0.49 mg/dL (ref 0.20–1.20)
BUN: 16.3 mg/dL (ref 7.0–26.0)
CALCIUM: 9.5 mg/dL (ref 8.4–10.4)
CO2: 26 meq/L (ref 22–29)
CREATININE: 0.9 mg/dL (ref 0.7–1.3)
Chloride: 102 mEq/L (ref 98–109)
EGFR: 82 mL/min/{1.73_m2} — ABNORMAL LOW (ref >90)
GLUCOSE: 101 mg/dL (ref 70–140)
Potassium: 4.3 mEq/L (ref 3.5–5.1)
Sodium: 138 mEq/L (ref 136–145)
Total Protein: 6.9 g/dL (ref 6.4–8.3)

## 2014-10-01 MED ORDER — SODIUM CHLORIDE 0.9 % IV SOLN
3.0000 mg/kg | Freq: Once | INTRAVENOUS | Status: AC
  Administered 2014-10-01: 280 mg via INTRAVENOUS
  Filled 2014-10-01: qty 28

## 2014-10-01 MED ORDER — SODIUM CHLORIDE 0.9 % IV SOLN
Freq: Once | INTRAVENOUS | Status: AC
  Administered 2014-10-01: 15:00:00 via INTRAVENOUS

## 2014-10-01 MED ORDER — SODIUM CHLORIDE 0.9 % IJ SOLN
10.0000 mL | INTRAMUSCULAR | Status: DC | PRN
  Administered 2014-10-01: 10 mL
  Filled 2014-10-01: qty 10

## 2014-10-01 MED ORDER — HEPARIN SOD (PORK) LOCK FLUSH 100 UNIT/ML IV SOLN
500.0000 [IU] | Freq: Once | INTRAVENOUS | Status: AC | PRN
  Administered 2014-10-01: 500 [IU]
  Filled 2014-10-01: qty 5

## 2014-10-01 NOTE — Progress Notes (Signed)
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  Primary site: Lung (Right)  Staging method: AJCC 7th Edition  Clinical: Stage IIIA (T3, N2, M0) signed by Curt Bears, MD on 05/09/2013 2:56 PM  Summary: Stage IIIA (T3, N2, M0)  PRIOR THERAPY:  1) Concurrent chemoradiation with weekly carboplatin for an AUC of 2 and paclitaxel 45 mg per meter squared. Status post 5 cycles. 2) Bronchoscopy, mediastinoscopy, right posterior lateral thoracotomy with right upper lobectomy, lymph node dissection,  and posterior approach to en bloc chest wall resection, ribs 2, 3, and 4, with positive bronchial resection margin. 3) palliative radiotherapy to the bronchial resection margin under the care of Dr. Tammi Klippel completed on 10/14/2013. 4) systemic chemotherapy with carboplatin for an AUC of 5 given on day 1 and Abraxane 100 mg/m given days 1, 8 and 15 every 3 weeks status post 3 cycles discontinued 12/31/2013 secondary to disease progression  CURRENT THERAPY: Immunotherapy with Nivolumab 3 mg/kg given every 2 weeks. Status post 18 cycles. Cycle 18 was held 09/02/2014 due to elevated liver enzymes. Nivolumab resumed 09/16/2014.  INTERVAL HISTORY:   Mr. Gartman presents for cycle #19 today. He denies fevers or chills. His appetite is low and is he down 2lbs. He drinking 3-4 Resource Shakes weekly. He has mild nausea that is managed with compazine PRN. He has shortness of breath with heat and humidity. He has a nagging cough with clear mucus. He denies hemoptysis. He has chronic pain to his back, right shoulder, and bilateral hips. He takes percocet PRN. He takes miralax and colace to stay regular. He is constantly fatigued.   Objective:  Vital signs in last 24 hours:  Blood pressure 103/59, pulse 79,  temperature 98 F (36.7 C), temperature source Oral, resp. rate 18, height '6\' 2"'$  (1.88 m), weight 201 lb 6.4 oz (91.354 kg), SpO2 99 %.    Skin: warm, dry  HEENT: sclerae anicteric, conjunctivae pink, oropharynx clear. No thrush or mucositis.  Lymph Nodes: No cervical or supraclavicular lymphadenopathy  Lungs: clear to auscultation bilaterally, no rales, wheezes, or rhonci  Heart: regular rate and rhythm  Abdomen: round, soft, non tender, positive bowel sounds  Musculoskeletal: No focal spinal tenderness, no peripheral edema  Neuro: non focal, well oriented, positive affect    Lab Results:  Lab Results  Component Value Date   WBC 5.9 10/01/2014   HGB 11.4* 10/01/2014   HCT 33.9* 10/01/2014   MCV 96.6 10/01/2014   PLT 329 10/01/2014   NEUTROABS 4.3 10/01/2014    Imaging:  No results found.  Medications: I have reviewed the patient's current medications.  Assessment/Plan: 1. Metastatic non-small cell lung cancer, squamous cell carcinoma, currently on active treatment with nivolumab. He has completed 17 cycles. Cycle 18 held on 09/02/2014 due to elevated liver enzymes. 2. Abnormal LFTs 09/02/2014. Improved 09/05/2014, 09/09/2014. Normalized 09/16/2014.   Disposition:  Mr. Montz is doing well today. The labs were reviewed in detail and were entirely stable. His LFTs are completely normal today. I discussed the patient with Dr. Julien Nordmann. He agrees the patient should proceed with cycle #19 of nivolumab as planned today. He will return in 2 weeks for a follow up visit with Dr. Mckinley Jewel himself. At that visit, repeat scans will be performed. The patient understands and agrees with this plan. He has been encouraged to  call with any issues that might arise before his next visit here.   Laurie Panda, NP 10/01/2014

## 2014-10-01 NOTE — Patient Instructions (Signed)
[facility] Discharge Instructions for Patients Receiving Chemotherapy  Today you received the following chemotherapy agents Opdivo.  To help prevent nausea and vomiting after your treatment, we encourage you to take your nausea medication as directed.   If you develop nausea and vomiting that is not controlled by your nausea medication, call the clinic.   BELOW ARE SYMPTOMS THAT SHOULD BE REPORTED IMMEDIATELY:  *FEVER GREATER THAN 100.5 F  *CHILLS WITH OR WITHOUT FEVER  NAUSEA AND VOMITING THAT IS NOT CONTROLLED WITH YOUR NAUSEA MEDICATION  *UNUSUAL SHORTNESS OF BREATH  *UNUSUAL BRUISING OR BLEEDING  TENDERNESS IN MOUTH AND THROAT WITH OR WITHOUT PRESENCE OF ULCERS  *URINARY PROBLEMS  *BOWEL PROBLEMS  UNUSUAL RASH Items with * indicate a potential emergency and should be followed up as soon as possible.  Feel free to call the clinic you have any questions or concerns. The clinic phone number is [phone].  Please show the CHEMO ALERT CARD at check-in to the Emergency Department and triage nurse.    

## 2014-10-14 ENCOUNTER — Telehealth: Payer: Self-pay | Admitting: Internal Medicine

## 2014-10-14 ENCOUNTER — Other Ambulatory Visit (HOSPITAL_BASED_OUTPATIENT_CLINIC_OR_DEPARTMENT_OTHER): Payer: Medicare Other

## 2014-10-14 ENCOUNTER — Encounter: Payer: Self-pay | Admitting: Internal Medicine

## 2014-10-14 ENCOUNTER — Ambulatory Visit (HOSPITAL_BASED_OUTPATIENT_CLINIC_OR_DEPARTMENT_OTHER): Payer: Medicare Other

## 2014-10-14 ENCOUNTER — Ambulatory Visit (HOSPITAL_BASED_OUTPATIENT_CLINIC_OR_DEPARTMENT_OTHER): Payer: Medicare Other | Admitting: Internal Medicine

## 2014-10-14 VITALS — BP 130/60 | HR 80 | Temp 98.5°F | Ht 74.0 in | Wt 200.4 lb

## 2014-10-14 DIAGNOSIS — C3411 Malignant neoplasm of upper lobe, right bronchus or lung: Secondary | ICD-10-CM | POA: Diagnosis present

## 2014-10-14 DIAGNOSIS — R079 Chest pain, unspecified: Secondary | ICD-10-CM

## 2014-10-14 DIAGNOSIS — Z5112 Encounter for antineoplastic immunotherapy: Secondary | ICD-10-CM

## 2014-10-14 DIAGNOSIS — R530 Neoplastic (malignant) related fatigue: Secondary | ICD-10-CM

## 2014-10-14 DIAGNOSIS — Z5111 Encounter for antineoplastic chemotherapy: Secondary | ICD-10-CM | POA: Diagnosis present

## 2014-10-14 DIAGNOSIS — C3491 Malignant neoplasm of unspecified part of right bronchus or lung: Secondary | ICD-10-CM

## 2014-10-14 LAB — COMPREHENSIVE METABOLIC PANEL (CC13)
ALBUMIN: 2.9 g/dL — AB (ref 3.5–5.0)
ALT: 8 U/L (ref 0–55)
ANION GAP: 7 meq/L (ref 3–11)
AST: 11 U/L (ref 5–34)
Alkaline Phosphatase: 115 U/L (ref 40–150)
BUN: 17.3 mg/dL (ref 7.0–26.0)
CHLORIDE: 104 meq/L (ref 98–109)
CO2: 27 meq/L (ref 22–29)
Calcium: 9.3 mg/dL (ref 8.4–10.4)
Creatinine: 0.9 mg/dL (ref 0.7–1.3)
EGFR: 85 mL/min/{1.73_m2} — AB (ref >90)
Glucose: 106 mg/dl (ref 70–140)
Potassium: 4.5 mEq/L (ref 3.5–5.1)
Sodium: 139 mEq/L (ref 136–145)
TOTAL PROTEIN: 6.7 g/dL (ref 6.4–8.3)
Total Bilirubin: 0.41 mg/dL (ref 0.20–1.20)

## 2014-10-14 LAB — CBC WITH DIFFERENTIAL/PLATELET
BASO%: 0.4 % (ref 0.0–2.0)
Basophils Absolute: 0 10*3/uL (ref 0.0–0.1)
EOS%: 2.8 % (ref 0.0–7.0)
Eosinophils Absolute: 0.1 10*3/uL (ref 0.0–0.5)
HCT: 32.8 % — ABNORMAL LOW (ref 38.4–49.9)
HGB: 11.1 g/dL — ABNORMAL LOW (ref 13.0–17.1)
LYMPH#: 0.5 10*3/uL — AB (ref 0.9–3.3)
LYMPH%: 10.4 % — AB (ref 14.0–49.0)
MCH: 32.4 pg (ref 27.2–33.4)
MCHC: 33.7 g/dL (ref 32.0–36.0)
MCV: 96.2 fL (ref 79.3–98.0)
MONO#: 0.7 10*3/uL (ref 0.1–0.9)
MONO%: 13.2 % (ref 0.0–14.0)
NEUT#: 3.8 10*3/uL (ref 1.5–6.5)
NEUT%: 73.2 % (ref 39.0–75.0)
PLATELETS: 312 10*3/uL (ref 140–400)
RBC: 3.41 10*6/uL — ABNORMAL LOW (ref 4.20–5.82)
RDW: 13.8 % (ref 11.0–14.6)
WBC: 5.2 10*3/uL (ref 4.0–10.3)

## 2014-10-14 MED ORDER — SODIUM CHLORIDE 0.9 % IV SOLN
3.0000 mg/kg | Freq: Once | INTRAVENOUS | Status: AC
  Administered 2014-10-14: 280 mg via INTRAVENOUS
  Filled 2014-10-14: qty 28

## 2014-10-14 MED ORDER — OXYCODONE-ACETAMINOPHEN 5-325 MG PO TABS: 1.0000 | ORAL_TABLET | Freq: Four times a day (QID) | ORAL | Status: DC | PRN

## 2014-10-14 MED ORDER — SODIUM CHLORIDE 0.9 % IV SOLN
Freq: Once | INTRAVENOUS | Status: AC
  Administered 2014-10-14: 16:00:00 via INTRAVENOUS

## 2014-10-14 MED ORDER — SODIUM CHLORIDE 0.9 % IJ SOLN
10.0000 mL | INTRAMUSCULAR | Status: DC | PRN
  Administered 2014-10-14: 10 mL
  Filled 2014-10-14: qty 10

## 2014-10-14 MED ORDER — HEPARIN SOD (PORK) LOCK FLUSH 100 UNIT/ML IV SOLN
500.0000 [IU] | Freq: Once | INTRAVENOUS | Status: AC | PRN
  Administered 2014-10-14: 500 [IU]
  Filled 2014-10-14: qty 5

## 2014-10-14 NOTE — Progress Notes (Signed)
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  Primary site: Lung (Right)  Staging method: AJCC 7th Edition  Clinical: Stage IIIA (T3, N2, M0) signed by Curt Bears, MD on 05/09/2013 2:56 PM  Summary: Stage IIIA (T3, N2, M0)  PRIOR THERAPY:  1) Concurrent chemoradiation with weekly carboplatin for an AUC of 2 and paclitaxel 45 mg per meter squared. Status post 5 cycles. 2) Bronchoscopy, mediastinoscopy, right posterior lateral thoracotomy with right upper lobectomy, lymph node dissection,  and posterior approach to en bloc chest wall resection, ribs 2, 3, and 4, with positive bronchial resection margin. 3) palliative radiotherapy to the best of resection margin under the care of Dr. Tammi Klippel completed on 10/14/2013. 4) systemic chemotherapy with carboplatin for an AUC of 5 given on day 1 and Abraxane 100 mg/m given days 1, 8 and 15 every 3 weeks status post 3 cycles discontinued 12/31/2013 secondary to disease progression  CURRENT THERAPY: Immunotherapy with Nivolumab 3 mg/kg given every 2 weeks. 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   INTERVAL HISTORY: Terry Robles 74 y.o. male returns to the clinic today for the patient is currently undergoing immunotherapy with Nivolumab status post 19 cycles and tolerating his treatment fairly well with no significant adverse effects. He denied having any significant nausea or vomiting. He has no fever or chills. He denied having any significant diarrhea. He complained today of a pinched pain in the right scapular area with movement. The patient is here today to start cycle #20 of his treatment. He denied having any significant chest pain, shortness of breath, cough or hemoptysis. He has no significant weight loss or night sweats.  MEDICAL HISTORY: Past Medical History  Diagnosis Date  . Hypercholesteremia 05/14/2011  . BPH (benign prostatic hyperplasia) 05/14/2011    takes Uroxatral daily  . GERD (gastroesophageal reflux disease)   . Shortness of breath     weather related  . Pneumonia 1965  . Headache(784.0)     occasionally  . Joint pain   . Maintenance chemotherapy   . Urinary frequency   . History of shingles   . Anemia   . Acquired thrombocytopenia   . 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 Cancer     squamous cell carcinoma right superior sulcus  . Lung cancer     right lung    ALLERGIES:  has No Known Allergies.  MEDICATIONS:  Current Outpatient Prescriptions  Medication Sig Dispense Refill  . alfuzosin (UROXATRAL) 10 MG 24 hr tablet Take 10 mg by mouth daily with breakfast.    .  clobetasol cream (TEMOVATE) 2.56 % Apply 1 application topically 2 (two) times daily.    . cycloSPORINE (RESTASIS) 0.05 % ophthalmic emulsion 1 drop 2 (two) times daily.    Marland Kitchen docusate sodium (COLACE) 100 MG capsule Take 100 mg by mouth daily.    . feeding supplement, RESOURCE BREEZE, (RESOURCE BREEZE) LIQD Take 1 Container by mouth daily at 3 pm.  0  .  HYDROcodone-homatropine (HYCODAN) 5-1.5 MG/5ML syrup Take 5 mLs by mouth every 6 (six) hours as needed for cough. 240 mL 0  . Multiple Vitamin (MULITIVITAMIN WITH MINERALS) TABS Take 1 tablet by mouth daily.    Marland Kitchen oxyCODONE-acetaminophen (PERCOCET/ROXICET) 5-325 MG per tablet Take 1 tablet by mouth every 6 (six) hours as needed for severe pain. 90 tablet 0  . polyethylene glycol (MIRALAX / GLYCOLAX) packet Take 17 g by mouth daily.    . prochlorperazine (COMPAZINE) 5 MG tablet Take 1 tablet (5 mg total) by mouth every 6 (six) hours as needed for nausea or vomiting. 30 tablet 2   No current facility-administered medications for this visit.    SURGICAL HISTORY:  Past Surgical History  Procedure Laterality Date  . Other surgical history      benign tumor removed from left leg several years ago  . Other surgical history      had melanoma/squamous cell removed previously per patient  . Tumor excision      Left knee  . Colonoscopy w/ biopsies and polypectomy    . Video bronchoscopy N/A 07/30/2013    Procedure: VIDEO BRONCHOSCOPY;  Surgeon: Grace Isaac, MD;  Location: [facility];  Service: Thoracic;  Laterality: N/A;  . Mediastinoscopy N/A 07/30/2013    Procedure: MEDIASTINOSCOPY;  Surgeon: Grace Isaac, MD;  Location: [facility];  Service: Thoracic;  Laterality: N/A;  . Video assisted thoracoscopy (vats)/wedge resection Right 07/30/2013    Procedure: VIDEO ASSISTED THORACOSCOPY;  Surgeon: Grace Isaac, MD;  Location: [facility];  Service: Thoracic;  Laterality: Right;  . Thoracotomy  07/30/2013    Procedure: THORACOTOMY FOR CHEST WALL RESECTION, RIGHT UPPER LOBECTOMY;  Surgeon: Grace Isaac, MD;  Location: [facility];  Service: Thoracic;;  . Adenoidectomy  as a child  . Tonsillectomy    . Portacath placement Left 10/30/2013    Procedure: INSERTION PORT-A-CATH WITH ULTRASOUND GUIDANCE AND FLUORO;  Surgeon: Grace Isaac, MD;  Location: [facility];  Service: Thoracic;  Laterality: Left;    REVIEW OF  SYSTEMS:  A comprehensive review of systems was negative except for: Constitutional: positive for fatigue   PHYSICAL EXAMINATION: General appearance: alert, cooperative and no distress Head: Normocephalic, without obvious abnormality, atraumatic Neck: no adenopathy, no JVD, supple, symmetrical, trachea midline and thyroid not enlarged, symmetric, no tenderness/mass/nodules Lymph nodes: Cervical, supraclavicular, and axillary nodes normal. Resp: clear to auscultation bilaterally Back: symmetric, no curvature. ROM normal. No CVA tenderness. Cardio: regular rate and rhythm, S1, S2 normal, no murmur, click, rub or gallop GI: soft, non-tender; bowel sounds normal; no masses,  no organomegaly Extremities: extremities normal, atraumatic, no cyanosis or edema  ECOG PERFORMANCE STATUS: 0 - Asymptomatic  Blood pressure 130/60, pulse 80, temperature 98.5 F (36.9 C), temperature source Oral, height '6\' 2"'$  (1.88 m), weight 200 lb 6.4 oz (90.901 kg), SpO2 99 %.  LABORATORY DATA: Lab Results  Component Value Date   WBC 5.2 10/14/2014   HGB 11.1* 10/14/2014   HCT 32.8* 10/14/2014   MCV 96.2 10/14/2014   PLT 312 10/14/2014      Chemistry  Component Value Date/Time   NA 139 10/14/2014 1455   NA 137 09/23/2014 1245   K 4.5 10/14/2014 1455   K 4.1 09/23/2014 1245   CL 102 09/23/2014 1245   CO2 27 10/14/2014 1455   CO2 27 09/23/2014 1245   BUN 17.3 10/14/2014 1455   BUN 17 09/23/2014 1245   CREATININE 0.9 10/14/2014 1455   CREATININE 0.94 09/23/2014 1245      Component Value Date/Time   CALCIUM 9.3 10/14/2014 1455   CALCIUM 8.7* 09/23/2014 1245   ALKPHOS 115 10/14/2014 1455   ALKPHOS 124 09/23/2014 1245   AST 11 10/14/2014 1455   AST 14* 09/23/2014 1245   ALT 8 10/14/2014 1455   ALT 13* 09/23/2014 1245   BILITOT 0.41 10/14/2014 1455   BILITOT 0.6 09/23/2014 1245       RADIOGRAPHIC STUDIES: No results found.  ASSESSMENT AND PLAN: This is a very pleasant 75 years old white  male with metastatic non-small cell lung cancer, squamous cell carcinoma. He is currently undergoing treatment with immunotherapy with Nivolumab status post 19 cycles and tolerating his treatment fairly well. I recommended for the patient to proceed with cycle #20 today as a scheduled. He would come back for follow-up visit in 2 weeks for reevaluation after repeating CT scan of the chest, abdomen and pelvis for restaging of his disease. For pain management, the patient was given a refill of Percocet. He was advised to call immediately if he has any concerning symptoms in the interval. The patient voices understanding of current disease status and treatment options and is in agreement with the current care plan.  All questions were answered. The patient knows to call the clinic with any problems, questions or concerns. We can certainly see the patient much sooner if necessary.  Disclaimer: This note was dictated with voice recognition software. Similar sounding words can inadvertently be transcribed and may not be corrected upon review.

## 2014-10-14 NOTE — Patient Instructions (Signed)
[facility] Discharge Instructions for Patients  Today you received the following: Nivolumab   To help prevent nausea and vomiting after your treatment, we encourage you to take your nausea medication as directed.    If you develop nausea and vomiting that is not controlled by your nausea medication, call the clinic.   BELOW ARE SYMPTOMS THAT SHOULD BE REPORTED IMMEDIATELY:  *FEVER GREATER THAN 100.5 F  *CHILLS WITH OR WITHOUT FEVER  NAUSEA AND VOMITING THAT IS NOT CONTROLLED WITH YOUR NAUSEA MEDICATION  *UNUSUAL SHORTNESS OF BREATH  *UNUSUAL BRUISING OR BLEEDING  TENDERNESS IN MOUTH AND THROAT WITH OR WITHOUT PRESENCE OF ULCERS  *URINARY PROBLEMS  *BOWEL PROBLEMS  UNUSUAL RASH Items with * indicate a potential emergency and should be followed up as soon as possible.  Feel free to call the clinic you have any questions or concerns. The clinic phone number is [phone].  Please show the CHEMO ALERT CARD at check-in to the Emergency Department and triage nurse.   

## 2014-10-14 NOTE — Telephone Encounter (Signed)
Gave and printed appt sched and avs fo rpt for July and AUg ..the patient did not want barium

## 2014-10-24 ENCOUNTER — Ambulatory Visit (HOSPITAL_COMMUNITY)
Admission: RE | Admit: 2014-10-24 | Discharge: 2014-10-24 | Disposition: A | Discharge status: Home / Self Care | Payer: Medicare Other | Source: Ambulatory Visit | Attending: Internal Medicine | Admitting: Internal Medicine

## 2014-10-24 DIAGNOSIS — J90 Pleural effusion, not elsewhere classified: Secondary | ICD-10-CM | POA: Insufficient documentation

## 2014-10-24 DIAGNOSIS — R530 Neoplastic (malignant) related fatigue: Secondary | ICD-10-CM

## 2014-10-24 DIAGNOSIS — Z9221 Personal history of antineoplastic chemotherapy: Secondary | ICD-10-CM | POA: Insufficient documentation

## 2014-10-24 DIAGNOSIS — C3411 Malignant neoplasm of upper lobe, right bronchus or lung: Secondary | ICD-10-CM | POA: Insufficient documentation

## 2014-10-24 DIAGNOSIS — R079 Chest pain, unspecified: Secondary | ICD-10-CM | POA: Diagnosis not present

## 2014-10-24 DIAGNOSIS — Z902 Acquired absence of lung [part of]: Secondary | ICD-10-CM | POA: Diagnosis not present

## 2014-10-24 DIAGNOSIS — Z923 Personal history of irradiation: Secondary | ICD-10-CM | POA: Insufficient documentation

## 2014-10-24 DIAGNOSIS — R918 Other nonspecific abnormal finding of lung field: Secondary | ICD-10-CM | POA: Insufficient documentation

## 2014-10-24 DIAGNOSIS — R0602 Shortness of breath: Secondary | ICD-10-CM | POA: Diagnosis not present

## 2014-10-24 DIAGNOSIS — R590 Localized enlarged lymph nodes: Secondary | ICD-10-CM | POA: Diagnosis not present

## 2014-10-24 DIAGNOSIS — Z5112 Encounter for antineoplastic immunotherapy: Secondary | ICD-10-CM

## 2014-10-24 MED ORDER — IOHEXOL 300 MG/ML  SOLN
100.0000 mL | Freq: Once | INTRAMUSCULAR | Status: AC | PRN
  Administered 2014-10-24: 100 mL via INTRAVENOUS

## 2014-10-28 ENCOUNTER — Ambulatory Visit: Payer: Medicare Other

## 2014-10-28 ENCOUNTER — Telehealth: Payer: Self-pay | Admitting: Physician Assistant

## 2014-10-28 ENCOUNTER — Other Ambulatory Visit (HOSPITAL_BASED_OUTPATIENT_CLINIC_OR_DEPARTMENT_OTHER): Payer: Medicare Other

## 2014-10-28 ENCOUNTER — Telehealth: Payer: Self-pay | Admitting: *Deleted

## 2014-10-28 ENCOUNTER — Ambulatory Visit (HOSPITAL_BASED_OUTPATIENT_CLINIC_OR_DEPARTMENT_OTHER): Payer: Medicare Other | Admitting: Physician Assistant

## 2014-10-28 ENCOUNTER — Encounter: Payer: Self-pay | Admitting: Physician Assistant

## 2014-10-28 VITALS — BP 107/59 | HR 90 | Temp 98.4°F | Resp 18 | Ht 74.0 in | Wt 196.1 lb

## 2014-10-28 DIAGNOSIS — J90 Pleural effusion, not elsewhere classified: Secondary | ICD-10-CM | POA: Diagnosis not present

## 2014-10-28 DIAGNOSIS — Z5112 Encounter for antineoplastic immunotherapy: Secondary | ICD-10-CM

## 2014-10-28 DIAGNOSIS — R079 Chest pain, unspecified: Secondary | ICD-10-CM

## 2014-10-28 DIAGNOSIS — C3411 Malignant neoplasm of upper lobe, right bronchus or lung: Secondary | ICD-10-CM | POA: Diagnosis present

## 2014-10-28 DIAGNOSIS — C3491 Malignant neoplasm of unspecified part of right bronchus or lung: Secondary | ICD-10-CM

## 2014-10-28 DIAGNOSIS — G479 Sleep disorder, unspecified: Secondary | ICD-10-CM

## 2014-10-28 DIAGNOSIS — N400 Enlarged prostate without lower urinary tract symptoms: Secondary | ICD-10-CM | POA: Diagnosis not present

## 2014-10-28 DIAGNOSIS — R591 Generalized enlarged lymph nodes: Secondary | ICD-10-CM

## 2014-10-28 DIAGNOSIS — G4700 Insomnia, unspecified: Secondary | ICD-10-CM

## 2014-10-28 DIAGNOSIS — R530 Neoplastic (malignant) related fatigue: Secondary | ICD-10-CM

## 2014-10-28 DIAGNOSIS — R351 Nocturia: Secondary | ICD-10-CM

## 2014-10-28 LAB — CBC WITH DIFFERENTIAL/PLATELET
BASO%: 0.4 % (ref 0.0–2.0)
BASOS ABS: 0 10*3/uL (ref 0.0–0.1)
EOS%: 2.5 % (ref 0.0–7.0)
Eosinophils Absolute: 0.1 10*3/uL (ref 0.0–0.5)
HCT: 37.4 % — ABNORMAL LOW (ref 38.4–49.9)
HEMOGLOBIN: 12.5 g/dL — AB (ref 13.0–17.1)
LYMPH%: 12.3 % — ABNORMAL LOW (ref 14.0–49.0)
MCH: 32.2 pg (ref 27.2–33.4)
MCHC: 33.5 g/dL (ref 32.0–36.0)
MCV: 96.1 fL (ref 79.3–98.0)
MONO#: 0.6 10*3/uL (ref 0.1–0.9)
MONO%: 11.1 % (ref 0.0–14.0)
NEUT#: 3.9 10*3/uL (ref 1.5–6.5)
NEUT%: 73.7 % (ref 39.0–75.0)
Platelets: 309 10*3/uL (ref 140–400)
RBC: 3.89 10*6/uL — ABNORMAL LOW (ref 4.20–5.82)
RDW: 14 % (ref 11.0–14.6)
WBC: 5.3 10*3/uL (ref 4.0–10.3)
lymph#: 0.7 10*3/uL — ABNORMAL LOW (ref 0.9–3.3)

## 2014-10-28 LAB — COMPREHENSIVE METABOLIC PANEL (CC13)
ALT: 9 U/L (ref 0–55)
AST: 14 U/L (ref 5–34)
Albumin: 3.3 g/dL — ABNORMAL LOW (ref 3.5–5.0)
Alkaline Phosphatase: 116 U/L (ref 40–150)
Anion Gap: 7 mEq/L (ref 3–11)
BUN: 13.1 mg/dL (ref 7.0–26.0)
CHLORIDE: 103 meq/L (ref 98–109)
CO2: 29 mEq/L (ref 22–29)
CREATININE: 1 mg/dL (ref 0.7–1.3)
Calcium: 9.6 mg/dL (ref 8.4–10.4)
EGFR: 74 mL/min/{1.73_m2} — AB (ref >90)
Glucose: 101 mg/dl (ref 70–140)
Potassium: 4.3 mEq/L (ref 3.5–5.1)
SODIUM: 139 meq/L (ref 136–145)
Total Bilirubin: 0.53 mg/dL (ref 0.20–1.20)
Total Protein: 7.6 g/dL (ref 6.4–8.3)

## 2014-10-28 MED ORDER — TEMAZEPAM 15 MG PO CAPS: 15.0000 mg | ORAL_CAPSULE | Freq: Every evening | ORAL | Status: DC | PRN

## 2014-10-28 NOTE — Telephone Encounter (Signed)
Per staff message and POF I have scheduled appts. Advised scheduler of appts and to move labs. JMW  

## 2014-10-28 NOTE — Telephone Encounter (Signed)
per pof to sch pt appt-sent MW email to sch trmt-will call pt after reply with updated sch-gave pt avs

## 2014-10-28 NOTE — Progress Notes (Addendum)
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  Primary site: Lung (Right)  Staging method: AJCC 7th Edition  Clinical: Stage IIIA (T3, N2, M0) signed by Curt Bears, MD on 05/09/2013 2:56 PM  Summary: Stage IIIA (T3, N2, M0)  PRIOR THERAPY:  1) Concurrent chemoradiation with weekly carboplatin for an AUC of 2 and paclitaxel 45 mg per meter squared. Status post 5 cycles. 2) Bronchoscopy, mediastinoscopy, right posterior lateral thoracotomy with right upper lobectomy, lymph node dissection,  and posterior approach to en bloc chest wall resection, ribs 2, 3, and 4, with positive bronchial resection margin. 3) palliative radiotherapy to the best of resection margin under the care of Dr. Tammi Klippel completed on 10/14/2013. 4) systemic chemotherapy with carboplatin for an AUC of 5 given on day 1 and Abraxane 100 mg/m given days 1, 8 and 15 every 3 weeks status post 3 cycles discontinued 12/31/2013 secondary to disease progression  CURRENT THERAPY: Immunotherapy with Nivolumab 3 mg/kg given every 2 weeks. 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   INTERVAL HISTORY: Terry Robles 74 y.o. male returns to the clinic today for the patient is currently undergoing immunotherapy with Nivolumab status post 20 cycles and tolerating his treatment fairly well with no significant adverse effects. He denied having any significant nausea or vomiting. He has no fever or chills. He denied having any significant diarrhea. He complains of having trouble sleeping. He wakes frequently at night. He is on Uroxatral for an enlarged prostate. He has not been seen by urologist. He does state that sometimes when he wakes at night he has the urge to urinate but only has a "dribble".  He denied having any significant chest pain, shortness of breath, cough or hemoptysis. He has no significant weight loss or night sweats. The patient is here today to start cycle #21 of his treatment. He recently had a restaging CT scan of the chest, abdomen and pelvis and also presents to discuss the results. Terry Robles states that he again had "explosive diarrhea" after drinking the water based contrast for his restaging CT scan. He had a similar but more severe episode with the more opaque contrast. He states that he had a stomachache the next day and 2 days after the CT scan he was back to his baseline.  MEDICAL HISTORY: Past Medical History  Diagnosis Date  . Hypercholesteremia 05/14/2011  . BPH (benign prostatic hyperplasia) 05/14/2011    takes Uroxatral daily  . GERD (gastroesophageal reflux disease)   . Shortness of breath     weather related  . Pneumonia 1965  .  Headache(784.0)     occasionally  . Joint pain   . Maintenance chemotherapy   . Urinary frequency   . History of shingles   . Anemia   . Acquired thrombocytopenia   . 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 Cancer     squamous cell carcinoma right superior sulcus  . Lung  cancer     right lung    ALLERGIES:  has No Known Allergies.  MEDICATIONS:  Current Outpatient Prescriptions  Medication Sig Dispense Refill  . alfuzosin (UROXATRAL) 10 MG 24 hr tablet Take 10 mg by mouth daily with breakfast.    . clobetasol cream (TEMOVATE) 6.22 % Apply 1 application topically 2 (two) times daily.    . cycloSPORINE (RESTASIS) 0.05 % ophthalmic emulsion 1 drop 2 (two) times daily.    Marland Kitchen docusate sodium (COLACE) 100 MG capsule Take 100 mg by mouth daily.    . feeding supplement, RESOURCE BREEZE, (RESOURCE BREEZE) LIQD Take 1 Container by mouth daily at 3 pm.  0  . HYDROcodone-homatropine (HYCODAN) 5-1.5 MG/5ML syrup Take 5 mLs by mouth every 6 (six) hours as needed for cough. 240 mL 0  . Multiple Vitamin (MULITIVITAMIN WITH MINERALS) TABS Take 1 tablet by mouth daily.    Marland Kitchen oxyCODONE-acetaminophen (PERCOCET/ROXICET) 5-325 MG per tablet Take 1 tablet by mouth every 6 (six) hours as needed for severe pain. 90 tablet 0  . polyethylene glycol (MIRALAX / GLYCOLAX) packet Take 17 g by mouth daily.    . prochlorperazine (COMPAZINE) 5 MG tablet Take 1 tablet (5 mg total) by mouth every 6 (six) hours as needed for nausea or vomiting. 30 tablet 2  . temazepam (RESTORIL) 15 MG capsule Take 1 capsule (15 mg total) by mouth at bedtime as needed for sleep. 30 capsule 0   No current facility-administered medications for this visit.    SURGICAL HISTORY:  Past Surgical History  Procedure Laterality Date  . Other surgical history      benign tumor removed from left leg several years ago  . Other surgical history      had melanoma/squamous cell removed previously per patient  . Tumor excision      Left knee  . Colonoscopy w/ biopsies and polypectomy    . Video bronchoscopy N/A 07/30/2013    Procedure: VIDEO BRONCHOSCOPY;  Surgeon: Grace Isaac, MD;  Location: [facility];  Service: Thoracic;  Laterality: N/A;  . Mediastinoscopy N/A 07/30/2013    Procedure: MEDIASTINOSCOPY;  Surgeon:  Grace Isaac, MD;  Location: [facility];  Service: Thoracic;  Laterality: N/A;  . Video assisted thoracoscopy (vats)/wedge resection Right 07/30/2013    Procedure: VIDEO ASSISTED THORACOSCOPY;  Surgeon: Grace Isaac, MD;  Location: [facility];  Service: Thoracic;  Laterality: Right;  . Thoracotomy  07/30/2013    Procedure: THORACOTOMY FOR CHEST WALL RESECTION, RIGHT UPPER LOBECTOMY;  Surgeon: Grace Isaac, MD;  Location: [facility];  Service: Thoracic;;  . Adenoidectomy  as a child  . Tonsillectomy    . Portacath placement Left 10/30/2013    Procedure: INSERTION PORT-A-CATH WITH ULTRASOUND GUIDANCE AND FLUORO;  Surgeon: Grace Isaac, MD;  Location: [facility];  Service: Thoracic;  Laterality: Left;    REVIEW OF SYSTEMS:  A comprehensive review of systems was negative except for: Constitutional: positive for fatigue   PHYSICAL EXAMINATION: General appearance: alert, cooperative and no distress Head: Normocephalic, without obvious abnormality, atraumatic Neck: no adenopathy, no JVD, supple, symmetrical, trachea midline  and thyroid not enlarged, symmetric, no tenderness/mass/nodules Lymph nodes: Cervical, supraclavicular, and axillary nodes normal. Resp: clear to auscultation bilaterally Back: symmetric, no curvature. ROM normal. No CVA tenderness. Cardio: regular rate and rhythm, S1, S2 normal, no murmur, click, rub or gallop GI: soft, non-tender; bowel sounds normal; no masses,  no organomegaly Extremities: extremities normal, atraumatic, no cyanosis or edema  ECOG PERFORMANCE STATUS: 0 - Asymptomatic  Blood pressure 107/59, pulse 90, temperature 98.4 F (36.9 C), temperature source Oral, resp. rate 18, height '6\' 2"'$  (1.88 m), weight 196 lb 1.6 oz (88.95 kg), SpO2 100 %.  LABORATORY DATA: Lab Results  Component Value Date   WBC 5.3 10/28/2014   HGB 12.5* 10/28/2014   HCT 37.4* 10/28/2014   MCV 96.1 10/28/2014   PLT 309 10/28/2014      Chemistry      Component Value Date/Time   NA 139  10/28/2014 0947   NA 137 09/23/2014 1245   K 4.3 10/28/2014 0947   K 4.1 09/23/2014 1245   CL 102 09/23/2014 1245   CO2 29 10/28/2014 0947   CO2 27 09/23/2014 1245   BUN 13.1 10/28/2014 0947   BUN 17 09/23/2014 1245   CREATININE 1.0 10/28/2014 0947   CREATININE 0.94 09/23/2014 1245      Component Value Date/Time   CALCIUM 9.6 10/28/2014 0947   CALCIUM 8.7* 09/23/2014 1245   ALKPHOS 116 10/28/2014 0947   ALKPHOS 124 09/23/2014 1245   AST 14 10/28/2014 0947   AST 14* 09/23/2014 1245   ALT 9 10/28/2014 0947   ALT 13* 09/23/2014 1245   BILITOT 0.53 10/28/2014 0947   BILITOT 0.6 09/23/2014 1245       RADIOGRAPHIC STUDIES: Ct Chest W Contrast  10/24/2014   CLINICAL DATA:  74 year old male with a history of right upper lobe lung cancer diagnosed in January 2015, status post right upper lobectomy with chest wall resection and right lower lobe wedge resection on 07/30/2013, status post radiation therapy in chemotherapy, presenting with shortness of breath.  EXAM: CT CHEST, ABDOMEN, AND PELVIS WITH CONTRAST  TECHNIQUE: Multidetector CT imaging of the chest, abdomen and pelvis was performed following the standard protocol during bolus administration of intravenous contrast.  CONTRAST:  140m OMNIPAQUE IOHEXOL 300 MG/ML  SOLN  COMPARISON:  Most recent CT chest, abdomen and pelvis from 08/15/2014.  FINDINGS: CT CHEST FINDINGS  Mediastinum/Nodes: Left subclavian MediPort terminates in the lower third of the superior vena cava. Normal heart size. Stable minimal pericardial fluid/thickening. There is atherosclerosis of the thoracic aorta, the great vessels of the mediastinum and the coronary arteries, including calcified atherosclerotic plaque in the left anterior descending, left circumflex and right coronary arteries. Atherosclerotic nonaneurysmal thoracic aorta. Normal caliber pulmonary arteries. No central pulmonary emboli. Normal visualized thyroid. There is a fluid level in the mid thoracic  esophageal lumen, indicating esophageal dysmotility and/or gastroesophageal reflux. No axillary lymphadenopathy by size criteria. There are a few subcentimeter short axis right axillary lymph nodes that have slightly increased in size, for example a 0.6 cm right axillary node (series 2/image 14), previously 0.3 cm. Superior left mediastinal 3.1 x 2.5 cm node (2/11) previously measured 2.3 x 1.2 cm, significantly increased. Enlarging clustered mild right pericardiophrenic lymphadenopathy, largest 1.0 cm (2/47), previously 0.7 cm, increased. Otherwise no mediastinal lymphadenopathy. No hilar lymphadenopathy.  Lungs/Pleura: Status post right upper lobectomy and superior right lower lobectomy wedge resection. Large right pleural effusion has increased. Mild diffuse thickening and enhancement of the right pleural, likely indicating a malignant  effusion. No left pleural effusion. No pneumothorax. Worsening segmental right middle lobe and near complete right lower lobe atelectasis. Stable sharply marginated bandlike consolidation with associated cylindrical bronchiectasis in the superior portion of the right lower lobe (series 6/image 18), likely posttreatment change. Stable mild paraseptal and centrilobular emphysema. Central left upper lobe 0.6 cm nodule (6/26) is unchanged since 04/12/2013. Posterior left upper lobe 0.2 cm pulmonary nodule (6/23) is unchanged since 04/12/2013. Peripheral left lower lobe 0.3 cm pulmonary nodule (6/34), unchanged in the interval. No new significant pulmonary nodules. No consolidation in the left lung.  Musculoskeletal: Stable post thoracotomy changes in the posterior right upper chest. Healed deformity in the posterior left seventh rib. Subcentimeter sclerotic focus in the right upper scapula, unchanged since 04/12/2013, possibly a benign bone island. Moderate degenerative changes in the thoracic spine. No suspicious focal osseous lesions in the chest. Circumscribed hypodense 5.5 x 1.6 cm  circumscribed subcutaneous mass in the anterolateral left chest wall at the level of the left anterior sixth intercostal space, with eccentric focal calcification, not appreciably changed since 04/12/2013, probably a benign sebaceous cyst.  CT ABDOMEN AND PELVIS FINDINGS  Hepatobiliary: Normal liver, with no liver mass. Nondistended gallbladder contains multiple minimally calcified gallstones, unchanged. No definite gallbladder wall thickening. No pericholecystic fat stranding. No biliary ductal dilatation.  Pancreas: Mild diffuse fatty infiltration of the otherwise normal pancreas.  Spleen: Normal.  Adrenals/Urinary Tract: Normal adrenals. Normal kidneys with no hydronephrosis and no renal mass. Normal caliber ureters. Relatively collapsed and normal urinary bladder.  Stomach/Bowel: Collapsed and grossly normal stomach. Normal caliber small bowel with no small bowel wall thickening. Normal appendix. Normal large bowel.  Vascular/Lymphatic: No lymphadenopathy in the abdomen or pelvis. Atherosclerotic nonaneurysmal abdominal aorta.  Reproductive: Stable mild to moderate prostatomegaly.  Other: No pneumoperitoneum, ascites or focal fluid collection.  Musculoskeletal: Moderate degenerative changes in the lumbar spine. No suspicious focal osseous lesions in the abdomen or pelvis.  IMPRESSION: 1. Progressive left superior mediastinal and right pericardiophrenic mediastinal lymphadenopathy, likely metastatic. Enlarging subcentimeter right axillary nodes are also suspicious for metastases. 2. Increased large right pleural effusion, likely malignant, with near complete right middle / right lower lobe atelectasis. 3. Stable tiny indeterminate left pulmonary nodules. 4. No metastatic disease in the abdomen or pelvis.   Electronically Signed   By: Ilona Sorrel M.D.   On: 10/24/2014 11:34   Ct Abdomen Pelvis W Contrast  10/24/2014   CLINICAL DATA:  74 year old male with a history of right upper lobe lung cancer diagnosed in  January 2015, status post right upper lobectomy with chest wall resection and right lower lobe wedge resection on 07/30/2013, status post radiation therapy in chemotherapy, presenting with shortness of breath.  EXAM: CT CHEST, ABDOMEN, AND PELVIS WITH CONTRAST  TECHNIQUE: Multidetector CT imaging of the chest, abdomen and pelvis was performed following the standard protocol during bolus administration of intravenous contrast.  CONTRAST:  148m OMNIPAQUE IOHEXOL 300 MG/ML  SOLN  COMPARISON:  Most recent CT chest, abdomen and pelvis from 08/15/2014.  FINDINGS: CT CHEST FINDINGS  Mediastinum/Nodes: Left subclavian MediPort terminates in the lower third of the superior vena cava. Normal heart size. Stable minimal pericardial fluid/thickening. There is atherosclerosis of the thoracic aorta, the great vessels of the mediastinum and the coronary arteries, including calcified atherosclerotic plaque in the left anterior descending, left circumflex and right coronary arteries. Atherosclerotic nonaneurysmal thoracic aorta. Normal caliber pulmonary arteries. No central pulmonary emboli. Normal visualized thyroid. There is a fluid level in the mid thoracic  esophageal lumen, indicating esophageal dysmotility and/or gastroesophageal reflux. No axillary lymphadenopathy by size criteria. There are a few subcentimeter short axis right axillary lymph nodes that have slightly increased in size, for example a 0.6 cm right axillary node (series 2/image 14), previously 0.3 cm. Superior left mediastinal 3.1 x 2.5 cm node (2/11) previously measured 2.3 x 1.2 cm, significantly increased. Enlarging clustered mild right pericardiophrenic lymphadenopathy, largest 1.0 cm (2/47), previously 0.7 cm, increased. Otherwise no mediastinal lymphadenopathy. No hilar lymphadenopathy.  Lungs/Pleura: Status post right upper lobectomy and superior right lower lobectomy wedge resection. Large right pleural effusion has increased. Mild diffuse thickening and  enhancement of the right pleural, likely indicating a malignant effusion. No left pleural effusion. No pneumothorax. Worsening segmental right middle lobe and near complete right lower lobe atelectasis. Stable sharply marginated bandlike consolidation with associated cylindrical bronchiectasis in the superior portion of the right lower lobe (series 6/image 18), likely posttreatment change. Stable mild paraseptal and centrilobular emphysema. Central left upper lobe 0.6 cm nodule (6/26) is unchanged since 04/12/2013. Posterior left upper lobe 0.2 cm pulmonary nodule (6/23) is unchanged since 04/12/2013. Peripheral left lower lobe 0.3 cm pulmonary nodule (6/34), unchanged in the interval. No new significant pulmonary nodules. No consolidation in the left lung.  Musculoskeletal: Stable post thoracotomy changes in the posterior right upper chest. Healed deformity in the posterior left seventh rib. Subcentimeter sclerotic focus in the right upper scapula, unchanged since 04/12/2013, possibly a benign bone island. Moderate degenerative changes in the thoracic spine. No suspicious focal osseous lesions in the chest. Circumscribed hypodense 5.5 x 1.6 cm circumscribed subcutaneous mass in the anterolateral left chest wall at the level of the left anterior sixth intercostal space, with eccentric focal calcification, not appreciably changed since 04/12/2013, probably a benign sebaceous cyst.  CT ABDOMEN AND PELVIS FINDINGS  Hepatobiliary: Normal liver, with no liver mass. Nondistended gallbladder contains multiple minimally calcified gallstones, unchanged. No definite gallbladder wall thickening. No pericholecystic fat stranding. No biliary ductal dilatation.  Pancreas: Mild diffuse fatty infiltration of the otherwise normal pancreas.  Spleen: Normal.  Adrenals/Urinary Tract: Normal adrenals. Normal kidneys with no hydronephrosis and no renal mass. Normal caliber ureters. Relatively collapsed and normal urinary bladder.   Stomach/Bowel: Collapsed and grossly normal stomach. Normal caliber small bowel with no small bowel wall thickening. Normal appendix. Normal large bowel.  Vascular/Lymphatic: No lymphadenopathy in the abdomen or pelvis. Atherosclerotic nonaneurysmal abdominal aorta.  Reproductive: Stable mild to moderate prostatomegaly.  Other: No pneumoperitoneum, ascites or focal fluid collection.  Musculoskeletal: Moderate degenerative changes in the lumbar spine. No suspicious focal osseous lesions in the abdomen or pelvis.  IMPRESSION: 1. Progressive left superior mediastinal and right pericardiophrenic mediastinal lymphadenopathy, likely metastatic. Enlarging subcentimeter right axillary nodes are also suspicious for metastases. 2. Increased large right pleural effusion, likely malignant, with near complete right middle / right lower lobe atelectasis. 3. Stable tiny indeterminate left pulmonary nodules. 4. No metastatic disease in the abdomen or pelvis.   Electronically Signed   By: Ilona Sorrel M.D.   On: 10/24/2014 11:34    ASSESSMENT AND PLAN: This is a very pleasant 74 years old white male with metastatic non-small cell lung cancer, squamous cell carcinoma. He is currently undergoing treatment with immunotherapy with Nivolumab status post 20 cycles and tolerating his treatment fairly well. The patient was discussed with and also seen by Dr. Julien Nordmann. His recent restaging CT scan revealed evidence for disease progression. We will discontinue immunotherapy treatment with [email or web address] will plan to start the patient  on systemic chemotherapy with gemcitabine given on days 1 and 8 every 3 weeks, with the first cycle expected in one week. Patient is aware of side effects of chemotherapy include nausea, vomiting, anemia and neutropenia, diarrhea or constipation. He is aware of the side effects and wishes to proceed with systemic chemotherapy with gemcitabine as recommended. He will follow-up in 2 weeks for symptom management  visit. For his difficulty sleeping. He was given a prescription for Restoril 15 mg by mouth at bedtime as needed for insomnia. He was also given the name of a urologist in the refill area to further evaluate his enlarged prostate.  He was advised to call immediately if he has any concerning symptoms in the interval. The patient voices understanding of current disease status and treatment options and is in agreement with the current care plan.  All questions were answered. The patient knows to call the clinic with any problems, questions or concerns. We can certainly see the patient much sooner if necessary.  Carlton Adam PA-C   ADDENDUM: Hematology/Oncology Attending: I had a face to face encounter with the patient. I recommended his care plan. This is a very pleasant 74 years old white male with metastatic non-small cell lung cancer, squamous cell carcinoma status post several chemotherapy regimens and most recently treated with immunotherapy with Nivolumab status post 20 cycles. The patient has been tolerating his treatment with Nivolumab fairly well with no significant adverse effects. Unfortunately the recent CT scan of the chest, abdomen and pelvis showed evidence for disease progression with progression of the left superior mediastinal and right pericardiophrenic mediastinal lymphadenopathy as well as enlarging right axillary lymph nodes and increased right pleural effusion. I had a lengthy discussion with the patient today about his current scan results and treatment options. I recommended for the patient to discontinue his current treatment with immunotherapy for now. I discussed with the patient other treatment options including treatment with single agent gemcitabine 1000 MG/M2 on days 1 and 8 every 3 weeks. He is expected to start the first cycle of this treatment next week. I discussed with the patient adverse effect of the treatment including but not limited to mild alopecia,  myelosuppression, nausea and vomiting, liver or renal dysfunction. He would come back for follow-up visit in 2 weeks for reevaluation and management of any adverse effect of his treatment. The patient was advised to call immediately if he has any concerning symptoms in the interval.  Disclaimer: This note was dictated with voice recognition software. Similar sounding words can inadvertently be transcribed and may be missed upon review. [name]., MD 10/29/2014

## 2014-10-29 ENCOUNTER — Telehealth: Payer: Self-pay | Admitting: Internal Medicine

## 2014-10-29 NOTE — Telephone Encounter (Signed)
per pof to sch pt appt-cld & spoke to pt and adv pt of 8/9 appt time -pt understood

## 2014-10-29 NOTE — Patient Instructions (Signed)
Your recent CT scan revealed evidence for disease progression. We will discontinue immunotherapy at this time. You will be started on systemic chemotherapy with gemcitabine, first cycle expected in one week Follow-up in 2 weeks for symptom management visit

## 2014-11-04 ENCOUNTER — Other Ambulatory Visit (HOSPITAL_BASED_OUTPATIENT_CLINIC_OR_DEPARTMENT_OTHER): Payer: Medicare Other

## 2014-11-04 ENCOUNTER — Other Ambulatory Visit: Payer: Medicare Other

## 2014-11-04 ENCOUNTER — Ambulatory Visit (HOSPITAL_BASED_OUTPATIENT_CLINIC_OR_DEPARTMENT_OTHER): Payer: Medicare Other

## 2014-11-04 VITALS — BP 116/57 | HR 83 | Temp 97.9°F | Resp 18

## 2014-11-04 DIAGNOSIS — C3411 Malignant neoplasm of upper lobe, right bronchus or lung: Secondary | ICD-10-CM | POA: Diagnosis present

## 2014-11-04 DIAGNOSIS — Z5111 Encounter for antineoplastic chemotherapy: Secondary | ICD-10-CM | POA: Diagnosis present

## 2014-11-04 DIAGNOSIS — Z23 Encounter for immunization: Secondary | ICD-10-CM

## 2014-11-04 DIAGNOSIS — C3410 Malignant neoplasm of upper lobe, unspecified bronchus or lung: Secondary | ICD-10-CM

## 2014-11-04 LAB — CBC WITH DIFFERENTIAL/PLATELET
BASO%: 0.7 % (ref 0.0–2.0)
BASOS ABS: 0 10*3/uL (ref 0.0–0.1)
EOS%: 1.8 % (ref 0.0–7.0)
Eosinophils Absolute: 0.1 10*3/uL (ref 0.0–0.5)
HEMATOCRIT: 36 % — AB (ref 38.4–49.9)
HGB: 11.9 g/dL — ABNORMAL LOW (ref 13.0–17.1)
LYMPH%: 12.3 % — ABNORMAL LOW (ref 14.0–49.0)
MCH: 31.7 pg (ref 27.2–33.4)
MCHC: 33.1 g/dL (ref 32.0–36.0)
MCV: 95.9 fL (ref 79.3–98.0)
MONO#: 0.5 10*3/uL (ref 0.1–0.9)
MONO%: 10.3 % (ref 0.0–14.0)
NEUT#: 3.9 10*3/uL (ref 1.5–6.5)
NEUT%: 74.9 % (ref 39.0–75.0)
Platelets: 292 10*3/uL (ref 140–400)
RBC: 3.76 10*6/uL — ABNORMAL LOW (ref 4.20–5.82)
RDW: 13.8 % (ref 11.0–14.6)
WBC: 5.2 10*3/uL (ref 4.0–10.3)
lymph#: 0.6 10*3/uL — ABNORMAL LOW (ref 0.9–3.3)

## 2014-11-04 LAB — COMPREHENSIVE METABOLIC PANEL (CC13)
ALBUMIN: 3 g/dL — AB (ref 3.5–5.0)
ALT: 17 U/L (ref 0–55)
AST: 17 U/L (ref 5–34)
Alkaline Phosphatase: 116 U/L (ref 40–150)
Anion Gap: 6 mEq/L (ref 3–11)
BUN: 12.4 mg/dL (ref 7.0–26.0)
CO2: 29 meq/L (ref 22–29)
Calcium: 9.5 mg/dL (ref 8.4–10.4)
Chloride: 105 mEq/L (ref 98–109)
Creatinine: 0.9 mg/dL (ref 0.7–1.3)
EGFR: 86 mL/min/{1.73_m2} — ABNORMAL LOW (ref >90)
Glucose: 94 mg/dl (ref 70–140)
Potassium: 4.4 mEq/L (ref 3.5–5.1)
Sodium: 140 mEq/L (ref 136–145)
Total Bilirubin: 0.39 mg/dL (ref 0.20–1.20)
Total Protein: 6.9 g/dL (ref 6.4–8.3)

## 2014-11-04 MED ORDER — SODIUM CHLORIDE 0.9 % IV SOLN
Freq: Once | INTRAVENOUS | Status: AC
  Administered 2014-11-04: 11:00:00 via INTRAVENOUS

## 2014-11-04 MED ORDER — PROCHLORPERAZINE MALEATE 10 MG PO TABS
ORAL_TABLET | ORAL | Status: AC
  Filled 2014-11-04: qty 1

## 2014-11-04 MED ORDER — SODIUM CHLORIDE 0.9 % IJ SOLN
10.0000 mL | INTRAMUSCULAR | Status: DC | PRN
Start: 2014-11-04 — End: 2014-11-04
  Administered 2014-11-04: 10 mL
  Filled 2014-11-04: qty 10

## 2014-11-04 MED ORDER — SODIUM CHLORIDE 0.9 % IV SOLN
1000.0000 mg/m2 | Freq: Once | INTRAVENOUS | Status: AC
  Administered 2014-11-04: 2166 mg via INTRAVENOUS
  Filled 2014-11-04: qty 56.97

## 2014-11-04 MED ORDER — HEPARIN SOD (PORK) LOCK FLUSH 100 UNIT/ML IV SOLN
500.0000 [IU] | Freq: Once | INTRAVENOUS | Status: AC | PRN
  Administered 2014-11-04: 500 [IU]
  Filled 2014-11-04: qty 5

## 2014-11-04 MED ORDER — PROCHLORPERAZINE MALEATE 10 MG PO TABS
10.0000 mg | ORAL_TABLET | Freq: Once | ORAL | Status: AC
  Administered 2014-11-04: 10 mg via ORAL

## 2014-11-04 NOTE — Patient Instructions (Signed)
[facility] Discharge Instructions for Patients Receiving Chemotherapy  Today you received the following chemotherapy agents gemzar  To help prevent nausea and vomiting after your treatment, we encourage you to take your nausea medication.   If you develop nausea and vomiting that is not controlled by your nausea medication, call the clinic.   BELOW ARE SYMPTOMS THAT SHOULD BE REPORTED IMMEDIATELY:  *FEVER GREATER THAN 100.5 F  *CHILLS WITH OR WITHOUT FEVER  NAUSEA AND VOMITING THAT IS NOT CONTROLLED WITH YOUR NAUSEA MEDICATION  *UNUSUAL SHORTNESS OF BREATH  *UNUSUAL BRUISING OR BLEEDING  TENDERNESS IN MOUTH AND THROAT WITH OR WITHOUT PRESENCE OF ULCERS  *URINARY PROBLEMS  *BOWEL PROBLEMS  UNUSUAL RASH Items with * indicate a potential emergency and should be followed up as soon as possible.  Feel free to call the clinic you have any questions or concerns. The clinic phone number is [phone].  Please show the [facility] at check-in to the Emergency Department and triage nurse.

## 2014-11-10 ENCOUNTER — Other Ambulatory Visit: Payer: Self-pay | Admitting: Medical Oncology

## 2014-11-10 DIAGNOSIS — C3490 Malignant neoplasm of unspecified part of unspecified bronchus or lung: Secondary | ICD-10-CM

## 2014-11-11 ENCOUNTER — Ambulatory Visit: Payer: Medicare Other

## 2014-11-11 ENCOUNTER — Ambulatory Visit (HOSPITAL_BASED_OUTPATIENT_CLINIC_OR_DEPARTMENT_OTHER): Payer: Medicare Other | Admitting: Physician Assistant

## 2014-11-11 ENCOUNTER — Other Ambulatory Visit (HOSPITAL_BASED_OUTPATIENT_CLINIC_OR_DEPARTMENT_OTHER): Payer: Medicare Other

## 2014-11-11 ENCOUNTER — Ambulatory Visit (HOSPITAL_BASED_OUTPATIENT_CLINIC_OR_DEPARTMENT_OTHER): Payer: Medicare Other

## 2014-11-11 ENCOUNTER — Other Ambulatory Visit: Payer: Medicare Other

## 2014-11-11 ENCOUNTER — Telehealth: Payer: Self-pay | Admitting: Physician Assistant

## 2014-11-11 ENCOUNTER — Encounter: Payer: Self-pay | Admitting: Physician Assistant

## 2014-11-11 VITALS — BP 107/52 | HR 91 | Temp 98.1°F | Resp 18 | Ht 74.0 in | Wt 196.9 lb

## 2014-11-11 DIAGNOSIS — Z5111 Encounter for antineoplastic chemotherapy: Secondary | ICD-10-CM | POA: Diagnosis present

## 2014-11-11 DIAGNOSIS — C3411 Malignant neoplasm of upper lobe, right bronchus or lung: Secondary | ICD-10-CM

## 2014-11-11 DIAGNOSIS — Z79899 Other long term (current) drug therapy: Secondary | ICD-10-CM | POA: Diagnosis not present

## 2014-11-11 DIAGNOSIS — C3491 Malignant neoplasm of unspecified part of right bronchus or lung: Secondary | ICD-10-CM

## 2014-11-11 DIAGNOSIS — C3490 Malignant neoplasm of unspecified part of unspecified bronchus or lung: Secondary | ICD-10-CM

## 2014-11-11 LAB — CBC WITH DIFFERENTIAL/PLATELET
BASO%: 0.7 % (ref 0.0–2.0)
Basophils Absolute: 0 10*3/uL (ref 0.0–0.1)
EOS%: 1 % (ref 0.0–7.0)
Eosinophils Absolute: 0 10*3/uL (ref 0.0–0.5)
HEMATOCRIT: 34.1 % — AB (ref 38.4–49.9)
HGB: 11.2 g/dL — ABNORMAL LOW (ref 13.0–17.1)
LYMPH#: 0.5 10*3/uL — AB (ref 0.9–3.3)
LYMPH%: 18.4 % (ref 14.0–49.0)
MCH: 31.5 pg (ref 27.2–33.4)
MCHC: 33 g/dL (ref 32.0–36.0)
MCV: 95.5 fL (ref 79.3–98.0)
MONO#: 0.6 10*3/uL (ref 0.1–0.9)
MONO%: 21.4 % — ABNORMAL HIGH (ref 0.0–14.0)
NEUT#: 1.7 10*3/uL (ref 1.5–6.5)
NEUT%: 58.5 % (ref 39.0–75.0)
Platelets: 219 10*3/uL (ref 140–400)
RBC: 3.57 10*6/uL — AB (ref 4.20–5.82)
RDW: 13.9 % (ref 11.0–14.6)
WBC: 2.9 10*3/uL — ABNORMAL LOW (ref 4.0–10.3)

## 2014-11-11 LAB — TSH CHCC: TSH: 1.542 m(IU)/L (ref 0.320–4.118)

## 2014-11-11 LAB — COMPREHENSIVE METABOLIC PANEL (CC13)
ALT: 19 U/L (ref 0–55)
AST: 14 U/L (ref 5–34)
Albumin: 3.1 g/dL — ABNORMAL LOW (ref 3.5–5.0)
Alkaline Phosphatase: 160 U/L — ABNORMAL HIGH (ref 40–150)
Anion Gap: 9 mEq/L (ref 3–11)
BUN: 9.5 mg/dL (ref 7.0–26.0)
CHLORIDE: 103 meq/L (ref 98–109)
CO2: 26 meq/L (ref 22–29)
CREATININE: 0.9 mg/dL (ref 0.7–1.3)
Calcium: 9.6 mg/dL (ref 8.4–10.4)
EGFR: 80 mL/min/{1.73_m2} — ABNORMAL LOW (ref >90)
GLUCOSE: 100 mg/dL (ref 70–140)
Potassium: 4 mEq/L (ref 3.5–5.1)
Sodium: 139 mEq/L (ref 136–145)
Total Bilirubin: 0.4 mg/dL (ref 0.20–1.20)
Total Protein: 7.2 g/dL (ref 6.4–8.3)

## 2014-11-11 MED ORDER — SODIUM CHLORIDE 0.9 % IV SOLN
1000.0000 mg/m2 | Freq: Once | INTRAVENOUS | Status: AC
  Administered 2014-11-11: 2166 mg via INTRAVENOUS
  Filled 2014-11-11: qty 56.97

## 2014-11-11 MED ORDER — SODIUM CHLORIDE 0.9 % IJ SOLN
10.0000 mL | INTRAMUSCULAR | Status: DC | PRN
  Administered 2014-11-11: 10 mL
  Filled 2014-11-11: qty 10

## 2014-11-11 MED ORDER — TEMAZEPAM 15 MG PO CAPS: 15.0000 mg | ORAL_CAPSULE | Freq: Every evening | ORAL | Status: DC | PRN

## 2014-11-11 MED ORDER — HEPARIN SOD (PORK) LOCK FLUSH 100 UNIT/ML IV SOLN
500.0000 [IU] | Freq: Once | INTRAVENOUS | Status: AC | PRN
  Administered 2014-11-11: 500 [IU]
  Filled 2014-11-11: qty 5

## 2014-11-11 MED ORDER — PROCHLORPERAZINE MALEATE 10 MG PO TABS
10.0000 mg | ORAL_TABLET | Freq: Once | ORAL | Status: AC
  Administered 2014-11-11: 10 mg via ORAL

## 2014-11-11 MED ORDER — PROCHLORPERAZINE MALEATE 10 MG PO TABS
ORAL_TABLET | ORAL | Status: AC
  Filled 2014-11-11: qty 1

## 2014-11-11 MED ORDER — SODIUM CHLORIDE 0.9 % IV SOLN
Freq: Once | INTRAVENOUS | Status: AC
  Administered 2014-11-11: 12:00:00 via INTRAVENOUS

## 2014-11-11 NOTE — Telephone Encounter (Signed)
Patient will check mychart for all appointment. Did not want a avs.

## 2014-11-11 NOTE — Progress Notes (Addendum)
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  Primary site: Lung (Right)  Staging method: AJCC 7th Edition  Clinical: Stage IIIA (T3, N2, M0) signed by Curt Bears, MD on 05/09/2013 2:56 PM  Summary: Stage IIIA (T3, N2, M0)  PRIOR THERAPY:  1) Concurrent chemoradiation with weekly carboplatin for an AUC of 2 and paclitaxel 45 mg per meter squared. Status post 5 cycles. 2) Bronchoscopy, mediastinoscopy, right posterior lateral thoracotomy with right upper lobectomy, lymph node dissection,  and posterior approach to en bloc chest wall resection, ribs 2, 3, and 4, with positive bronchial resection margin. 3) palliative radiotherapy to the best of resection margin under the care of Dr. Tammi Klippel completed on 10/14/2013. 4) systemic chemotherapy with carboplatin for an AUC of 5 given on day 1 and Abraxane 100 mg/m given days 1, 8 and 15 every 3 weeks status post 3 cycles discontinued 12/31/2013 secondary to disease progression 5) Immunotherapy with Nivolumab 3 mg/kg given every 2 weeks. Status post 20 cycles  CURRENT THERAPY: Systemic chemotherapy with gemcitabine 1000 mg region squared given on days 1 and 8 every 3 weeks. 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   INTERVAL HISTORY: KHADIM LUNDBERG 74 y.o. male returns to the clinic today for a symptom management visit. The patient is currently being treated with gemcitabine after disease progression after 20 cycles of immunotherapy with nivolumab. He tolerated the first treatment with gemcitabine relatively well with the exception of a "upset stomach" for 3 or 4 days after treatment. He denied any nausea, vomiting diarrhea, constipation. He's had no bleeding.  He denied having any significant chest pain, shortness of breath, cough or hemoptysis. He has no significant weight loss or night sweats.   MEDICAL HISTORY: Past Medical History  Diagnosis Date  . Hypercholesteremia 05/14/2011  . BPH (benign prostatic hyperplasia) 05/14/2011    takes Uroxatral daily  . GERD (gastroesophageal reflux disease)   . Shortness of breath     weather related  . Pneumonia 1965  . Headache(784.0)     occasionally  . Joint pain   . Maintenance chemotherapy   . Urinary frequency   . History of shingles   . Anemia   . Acquired thrombocytopenia   . 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 Cancer     squamous cell carcinoma right superior sulcus  . Lung cancer     right lung    ALLERGIES:  has No Known Allergies.  MEDICATIONS:  Current Outpatient Prescriptions  Medication Sig Dispense Refill  . alfuzosin (  UROXATRAL) 10 MG 24 hr tablet Take 10 mg by mouth daily with breakfast.    . clobetasol cream (TEMOVATE) 4.62 % Apply 1 application topically 2 (two) times daily.    . cycloSPORINE (RESTASIS) 0.05 % ophthalmic emulsion 1 drop 2 (two) times daily.    Marland Kitchen docusate sodium (COLACE) 100 MG capsule Take 100 mg by mouth daily.    . feeding  supplement, RESOURCE BREEZE, (RESOURCE BREEZE) LIQD Take 1 Container by mouth daily at 3 pm.  0  . HYDROcodone-homatropine (HYCODAN) 5-1.5 MG/5ML syrup Take 5 mLs by mouth every 6 (six) hours as needed for cough. 240 mL 0  . Multiple Vitamin (MULITIVITAMIN WITH MINERALS) TABS Take 1 tablet by mouth daily.    Marland Kitchen oxyCODONE-acetaminophen (PERCOCET/ROXICET) 5-325 MG per tablet Take 1 tablet by mouth every 6 (six) hours as needed for severe pain. 90 tablet 0  . polyethylene glycol (MIRALAX / GLYCOLAX) packet Take 17 g by mouth daily.    . prochlorperazine (COMPAZINE) 5 MG tablet Take 1 tablet (5 mg total) by mouth every 6 (six) hours as needed for nausea or vomiting. 30 tablet 2  . temazepam (RESTORIL) 15 MG capsule Take 1 capsule (15 mg total) by mouth at bedtime as needed for sleep. 30 capsule 0   No current facility-administered medications for this visit.    SURGICAL HISTORY:  Past Surgical History  Procedure Laterality Date  . Other surgical history      benign tumor removed from left leg several years ago  . Other surgical history      had melanoma/squamous cell removed previously per patient  . Tumor excision      Left knee  . Colonoscopy w/ biopsies and polypectomy    . Video bronchoscopy N/A 07/30/2013    Procedure: VIDEO BRONCHOSCOPY;  Surgeon: Grace Isaac, MD;  Location: [facility];  Service: Thoracic;  Laterality: N/A;  . Mediastinoscopy N/A 07/30/2013    Procedure: MEDIASTINOSCOPY;  Surgeon: Grace Isaac, MD;  Location: [facility];  Service: Thoracic;  Laterality: N/A;  . Video assisted thoracoscopy (vats)/wedge resection Right 07/30/2013    Procedure: VIDEO ASSISTED THORACOSCOPY;  Surgeon: Grace Isaac, MD;  Location: [facility];  Service: Thoracic;  Laterality: Right;  . Thoracotomy  07/30/2013    Procedure: THORACOTOMY FOR CHEST WALL RESECTION, RIGHT UPPER LOBECTOMY;  Surgeon: Grace Isaac, MD;  Location: [facility];  Service: Thoracic;;  . Adenoidectomy  as a child  . Tonsillectomy     . Portacath placement Left 10/30/2013    Procedure: INSERTION PORT-A-CATH WITH ULTRASOUND GUIDANCE AND FLUORO;  Surgeon: Grace Isaac, MD;  Location: [facility];  Service: Thoracic;  Laterality: Left;    REVIEW OF SYSTEMS:  A comprehensive review of systems was negative except for: Constitutional: positive for fatigue Gastrointestinal: positive for dyspepsia   PHYSICAL EXAMINATION: General appearance: alert, cooperative and no distress Head: Normocephalic, without obvious abnormality, atraumatic Neck: no adenopathy, no JVD, supple, symmetrical, trachea midline and thyroid not enlarged, symmetric, no tenderness/mass/nodules Lymph nodes: Cervical, supraclavicular, and axillary nodes normal. Resp: clear to auscultation bilaterally Back: symmetric, no curvature. ROM normal. No CVA tenderness. Cardio: regular rate and rhythm, S1, S2 normal, no murmur, click, rub or gallop GI: soft, non-tender; bowel sounds normal; no masses,  no organomegaly Extremities: extremities normal, atraumatic, no cyanosis or edema  ECOG PERFORMANCE STATUS: 0 - Asymptomatic  Blood pressure 107/52, pulse 91, temperature 98.1 F (36.7 C), temperature source Oral, resp. rate 18, height '6\' 2"'$  (1.88 m), weight  196 lb 14.4 oz (89.313 kg), SpO2 100 %.  LABORATORY DATA: Lab Results  Component Value Date   WBC 2.9* 11/11/2014   HGB 11.2* 11/11/2014   HCT 34.1* 11/11/2014   MCV 95.5 11/11/2014   PLT 219 11/11/2014      Chemistry      Component Value Date/Time   NA 139 11/11/2014 1013   NA 137 09/23/2014 1245   K 4.0 11/11/2014 1013   K 4.1 09/23/2014 1245   CL 102 09/23/2014 1245   CO2 26 11/11/2014 1013   CO2 27 09/23/2014 1245   BUN 9.5 11/11/2014 1013   BUN 17 09/23/2014 1245   CREATININE 0.9 11/11/2014 1013   CREATININE 0.94 09/23/2014 1245      Component Value Date/Time   CALCIUM 9.6 11/11/2014 1013   CALCIUM 8.7* 09/23/2014 1245   ALKPHOS 160* 11/11/2014 1013   ALKPHOS 124 09/23/2014 1245   AST 14  11/11/2014 1013   AST 14* 09/23/2014 1245   ALT 19 11/11/2014 1013   ALT 13* 09/23/2014 1245   BILITOT 0.40 11/11/2014 1013   BILITOT 0.6 09/23/2014 1245       RADIOGRAPHIC STUDIES: Ct Chest W Contrast  10/24/2014   CLINICAL DATA:  74 year old male with a history of right upper lobe lung cancer diagnosed in January 2015, status post right upper lobectomy with chest wall resection and right lower lobe wedge resection on 07/30/2013, status post radiation therapy in chemotherapy, presenting with shortness of breath.  EXAM: CT CHEST, ABDOMEN, AND PELVIS WITH CONTRAST  TECHNIQUE: Multidetector CT imaging of the chest, abdomen and pelvis was performed following the standard protocol during bolus administration of intravenous contrast.  CONTRAST:  124m OMNIPAQUE IOHEXOL 300 MG/ML  SOLN  COMPARISON:  Most recent CT chest, abdomen and pelvis from 08/15/2014.  FINDINGS: CT CHEST FINDINGS  Mediastinum/Nodes: Left subclavian MediPort terminates in the lower third of the superior vena cava. Normal heart size. Stable minimal pericardial fluid/thickening. There is atherosclerosis of the thoracic aorta, the great vessels of the mediastinum and the coronary arteries, including calcified atherosclerotic plaque in the left anterior descending, left circumflex and right coronary arteries. Atherosclerotic nonaneurysmal thoracic aorta. Normal caliber pulmonary arteries. No central pulmonary emboli. Normal visualized thyroid. There is a fluid level in the mid thoracic esophageal lumen, indicating esophageal dysmotility and/or gastroesophageal reflux. No axillary lymphadenopathy by size criteria. There are a few subcentimeter short axis right axillary lymph nodes that have slightly increased in size, for example a 0.6 cm right axillary node (series 2/image 14), previously 0.3 cm. Superior left mediastinal 3.1 x 2.5 cm node (2/11) previously measured 2.3 x 1.2 cm, significantly increased. Enlarging clustered mild right  pericardiophrenic lymphadenopathy, largest 1.0 cm (2/47), previously 0.7 cm, increased. Otherwise no mediastinal lymphadenopathy. No hilar lymphadenopathy.  Lungs/Pleura: Status post right upper lobectomy and superior right lower lobectomy wedge resection. Large right pleural effusion has increased. Mild diffuse thickening and enhancement of the right pleural, likely indicating a malignant effusion. No left pleural effusion. No pneumothorax. Worsening segmental right middle lobe and near complete right lower lobe atelectasis. Stable sharply marginated bandlike consolidation with associated cylindrical bronchiectasis in the superior portion of the right lower lobe (series 6/image 18), likely posttreatment change. Stable mild paraseptal and centrilobular emphysema. Central left upper lobe 0.6 cm nodule (6/26) is unchanged since 04/12/2013. Posterior left upper lobe 0.2 cm pulmonary nodule (6/23) is unchanged since 04/12/2013. Peripheral left lower lobe 0.3 cm pulmonary nodule (6/34), unchanged in the interval. No new significant pulmonary nodules.  No consolidation in the left lung.  Musculoskeletal: Stable post thoracotomy changes in the posterior right upper chest. Healed deformity in the posterior left seventh rib. Subcentimeter sclerotic focus in the right upper scapula, unchanged since 04/12/2013, possibly a benign bone island. Moderate degenerative changes in the thoracic spine. No suspicious focal osseous lesions in the chest. Circumscribed hypodense 5.5 x 1.6 cm circumscribed subcutaneous mass in the anterolateral left chest wall at the level of the left anterior sixth intercostal space, with eccentric focal calcification, not appreciably changed since 04/12/2013, probably a benign sebaceous cyst.  CT ABDOMEN AND PELVIS FINDINGS  Hepatobiliary: Normal liver, with no liver mass. Nondistended gallbladder contains multiple minimally calcified gallstones, unchanged. No definite gallbladder wall thickening. No  pericholecystic fat stranding. No biliary ductal dilatation.  Pancreas: Mild diffuse fatty infiltration of the otherwise normal pancreas.  Spleen: Normal.  Adrenals/Urinary Tract: Normal adrenals. Normal kidneys with no hydronephrosis and no renal mass. Normal caliber ureters. Relatively collapsed and normal urinary bladder.  Stomach/Bowel: Collapsed and grossly normal stomach. Normal caliber small bowel with no small bowel wall thickening. Normal appendix. Normal large bowel.  Vascular/Lymphatic: No lymphadenopathy in the abdomen or pelvis. Atherosclerotic nonaneurysmal abdominal aorta.  Reproductive: Stable mild to moderate prostatomegaly.  Other: No pneumoperitoneum, ascites or focal fluid collection.  Musculoskeletal: Moderate degenerative changes in the lumbar spine. No suspicious focal osseous lesions in the abdomen or pelvis.  IMPRESSION: 1. Progressive left superior mediastinal and right pericardiophrenic mediastinal lymphadenopathy, likely metastatic. Enlarging subcentimeter right axillary nodes are also suspicious for metastases. 2. Increased large right pleural effusion, likely malignant, with near complete right middle / right lower lobe atelectasis. 3. Stable tiny indeterminate left pulmonary nodules. 4. No metastatic disease in the abdomen or pelvis.   Electronically Signed   By: Ilona Sorrel M.D.   On: 10/24/2014 11:34   Ct Abdomen Pelvis W Contrast  10/24/2014   CLINICAL DATA:  74 year old male with a history of right upper lobe lung cancer diagnosed in January 2015, status post right upper lobectomy with chest wall resection and right lower lobe wedge resection on 07/30/2013, status post radiation therapy in chemotherapy, presenting with shortness of breath.  EXAM: CT CHEST, ABDOMEN, AND PELVIS WITH CONTRAST  TECHNIQUE: Multidetector CT imaging of the chest, abdomen and pelvis was performed following the standard protocol during bolus administration of intravenous contrast.  CONTRAST:  182m  OMNIPAQUE IOHEXOL 300 MG/ML  SOLN  COMPARISON:  Most recent CT chest, abdomen and pelvis from 08/15/2014.  FINDINGS: CT CHEST FINDINGS  Mediastinum/Nodes: Left subclavian MediPort terminates in the lower third of the superior vena cava. Normal heart size. Stable minimal pericardial fluid/thickening. There is atherosclerosis of the thoracic aorta, the great vessels of the mediastinum and the coronary arteries, including calcified atherosclerotic plaque in the left anterior descending, left circumflex and right coronary arteries. Atherosclerotic nonaneurysmal thoracic aorta. Normal caliber pulmonary arteries. No central pulmonary emboli. Normal visualized thyroid. There is a fluid level in the mid thoracic esophageal lumen, indicating esophageal dysmotility and/or gastroesophageal reflux. No axillary lymphadenopathy by size criteria. There are a few subcentimeter short axis right axillary lymph nodes that have slightly increased in size, for example a 0.6 cm right axillary node (series 2/image 14), previously 0.3 cm. Superior left mediastinal 3.1 x 2.5 cm node (2/11) previously measured 2.3 x 1.2 cm, significantly increased. Enlarging clustered mild right pericardiophrenic lymphadenopathy, largest 1.0 cm (2/47), previously 0.7 cm, increased. Otherwise no mediastinal lymphadenopathy. No hilar lymphadenopathy.  Lungs/Pleura: Status post right upper lobectomy and superior  right lower lobectomy wedge resection. Large right pleural effusion has increased. Mild diffuse thickening and enhancement of the right pleural, likely indicating a malignant effusion. No left pleural effusion. No pneumothorax. Worsening segmental right middle lobe and near complete right lower lobe atelectasis. Stable sharply marginated bandlike consolidation with associated cylindrical bronchiectasis in the superior portion of the right lower lobe (series 6/image 18), likely posttreatment change. Stable mild paraseptal and centrilobular emphysema.  Central left upper lobe 0.6 cm nodule (6/26) is unchanged since 04/12/2013. Posterior left upper lobe 0.2 cm pulmonary nodule (6/23) is unchanged since 04/12/2013. Peripheral left lower lobe 0.3 cm pulmonary nodule (6/34), unchanged in the interval. No new significant pulmonary nodules. No consolidation in the left lung.  Musculoskeletal: Stable post thoracotomy changes in the posterior right upper chest. Healed deformity in the posterior left seventh rib. Subcentimeter sclerotic focus in the right upper scapula, unchanged since 04/12/2013, possibly a benign bone island. Moderate degenerative changes in the thoracic spine. No suspicious focal osseous lesions in the chest. Circumscribed hypodense 5.5 x 1.6 cm circumscribed subcutaneous mass in the anterolateral left chest wall at the level of the left anterior sixth intercostal space, with eccentric focal calcification, not appreciably changed since 04/12/2013, probably a benign sebaceous cyst.  CT ABDOMEN AND PELVIS FINDINGS  Hepatobiliary: Normal liver, with no liver mass. Nondistended gallbladder contains multiple minimally calcified gallstones, unchanged. No definite gallbladder wall thickening. No pericholecystic fat stranding. No biliary ductal dilatation.  Pancreas: Mild diffuse fatty infiltration of the otherwise normal pancreas.  Spleen: Normal.  Adrenals/Urinary Tract: Normal adrenals. Normal kidneys with no hydronephrosis and no renal mass. Normal caliber ureters. Relatively collapsed and normal urinary bladder.  Stomach/Bowel: Collapsed and grossly normal stomach. Normal caliber small bowel with no small bowel wall thickening. Normal appendix. Normal large bowel.  Vascular/Lymphatic: No lymphadenopathy in the abdomen or pelvis. Atherosclerotic nonaneurysmal abdominal aorta.  Reproductive: Stable mild to moderate prostatomegaly.  Other: No pneumoperitoneum, ascites or focal fluid collection.  Musculoskeletal: Moderate degenerative changes in the lumbar  spine. No suspicious focal osseous lesions in the abdomen or pelvis.  IMPRESSION: 1. Progressive left superior mediastinal and right pericardiophrenic mediastinal lymphadenopathy, likely metastatic. Enlarging subcentimeter right axillary nodes are also suspicious for metastases. 2. Increased large right pleural effusion, likely malignant, with near complete right middle / right lower lobe atelectasis. 3. Stable tiny indeterminate left pulmonary nodules. 4. No metastatic disease in the abdomen or pelvis.   Electronically Signed   By: Ilona Sorrel M.D.   On: 10/24/2014 11:34    ASSESSMENT AND PLAN: This is a very pleasant 74 years old white male with metastatic non-small cell lung cancer, squamous cell carcinoma. He is currently undergoing treatment with immunotherapy with Nivolumab status post 20 cycles and tolerating his treatment fairly well. The patient was discussed with and also seen by Dr. Julien Nordmann. His recent restaging CT scan revealed evidence for disease progression. We have discontinued immunotherapy treatment with nivoulmab. He is currently being treated with systemic chemotherapy with gemcitabine given on days 1 and 8 every 3 weeks. Status post day 1 of cycle #1. He'll proceed with day 8 of cycle #1 today as scheduled. He'll follow-up in 2 weeks prior to the start of cycle #2.  He will continue on Restoril as prescribed at bedtime as needed for sleep.  He was advised to call immediately if he has any concerning symptoms in the interval. The patient voices understanding of current disease status and treatment options and is in agreement with the current care  plan.  All questions were answered. The patient knows to call the clinic with any problems, questions or concerns. We can certainly see the patient much sooner if necessary.  Carlton Adam, PA-C 11/11/2014  ADDENDUM: Hematology/Oncology Attending: I had a face to face encounter with the patient. I recommended his care plan. This is a  very pleasant 74 years old white male with metastatic non-small cell lung cancer status post several chemotherapy as well as immunotherapy regimen. He was found recently to have evidence for disease progression in his immunotherapy with Nivolumab was discontinued. The patient is currently receiving systemic chemotherapy in the form of gemcitabine 1000 MG/M2 on days 1 and 8 status post day 1 of the first cycle. He is tolerating his systemic chemotherapy fairly well with no significant adverse effects. I recommended for the patient to continue his treatment as scheduled. He would come back for follow-up visit in 2 weeks for reevaluation before starting cycle #2. For insomnia, the patient was started on Restoril. He was advised to call immediately if he has any concerning symptoms in the interval.  Disclaimer: This note was dictated with voice recognition software. Similar sounding words can inadvertently be transcribed and may be missed upon review. [name]., MD 11/15/2014

## 2014-11-11 NOTE — Patient Instructions (Signed)
[facility] Discharge Instructions for Patients Receiving Chemotherapy  Today you received the following chemotherapy agents: gemzar  To help prevent nausea and vomiting after your treatment, we encourage you to take your nausea medication.  Take it as often as prescribed.     If you develop nausea and vomiting that is not controlled by your nausea medication, call the clinic. If it is after clinic hours your family physician or the after hours number for the clinic or go to the Emergency Department.   BELOW ARE SYMPTOMS THAT SHOULD BE REPORTED IMMEDIATELY:  *FEVER GREATER THAN 100.5 F  *CHILLS WITH OR WITHOUT FEVER  NAUSEA AND VOMITING THAT IS NOT CONTROLLED WITH YOUR NAUSEA MEDICATION  *UNUSUAL SHORTNESS OF BREATH  *UNUSUAL BRUISING OR BLEEDING  TENDERNESS IN MOUTH AND THROAT WITH OR WITHOUT PRESENCE OF ULCERS  *URINARY PROBLEMS  *BOWEL PROBLEMS  UNUSUAL RASH Items with * indicate a potential emergency and should be followed up as soon as possible.  Feel free to call the clinic you have any questions or concerns. The clinic phone number is [phone].   I have been informed and understand all the instructions given to me. I know to contact the clinic, my physician, or go to the Emergency Department if any problems should occur. I do not have any questions at this time, but understand that I may call the clinic during office hours   should I have any questions or need assistance in obtaining follow up care.    __________________________________________  _____________  __________ Signature of Patient or Authorized Representative            Date                   Time    __________________________________________ Nurse's Signature    

## 2014-11-14 ENCOUNTER — Other Ambulatory Visit: Payer: Self-pay | Admitting: Medical Oncology

## 2014-11-14 DIAGNOSIS — C3490 Malignant neoplasm of unspecified part of unspecified bronchus or lung: Secondary | ICD-10-CM

## 2014-11-14 NOTE — Patient Instructions (Signed)
Continue labs and chemotherapy as scheduled Follow-up in 2 weeks prior to starting next scheduled cycle of chemotherapy

## 2014-11-25 ENCOUNTER — Other Ambulatory Visit: Payer: Medicare Other

## 2014-11-25 ENCOUNTER — Other Ambulatory Visit (HOSPITAL_BASED_OUTPATIENT_CLINIC_OR_DEPARTMENT_OTHER): Payer: Medicare Other

## 2014-11-25 ENCOUNTER — Ambulatory Visit (HOSPITAL_BASED_OUTPATIENT_CLINIC_OR_DEPARTMENT_OTHER): Payer: Medicare Other | Admitting: Internal Medicine

## 2014-11-25 ENCOUNTER — Other Ambulatory Visit: Payer: Self-pay | Admitting: Medical Oncology

## 2014-11-25 ENCOUNTER — Encounter: Payer: Self-pay | Admitting: Internal Medicine

## 2014-11-25 ENCOUNTER — Ambulatory Visit (HOSPITAL_BASED_OUTPATIENT_CLINIC_OR_DEPARTMENT_OTHER): Payer: Medicare Other

## 2014-11-25 ENCOUNTER — Telehealth: Payer: Self-pay | Admitting: Internal Medicine

## 2014-11-25 ENCOUNTER — Ambulatory Visit: Payer: Medicare Other

## 2014-11-25 VITALS — BP 116/57 | HR 84 | Temp 98.3°F | Resp 16 | Ht 74.0 in | Wt 193.2 lb

## 2014-11-25 DIAGNOSIS — R530 Neoplastic (malignant) related fatigue: Secondary | ICD-10-CM

## 2014-11-25 DIAGNOSIS — R140 Abdominal distension (gaseous): Secondary | ICD-10-CM

## 2014-11-25 DIAGNOSIS — Z5112 Encounter for antineoplastic immunotherapy: Secondary | ICD-10-CM

## 2014-11-25 DIAGNOSIS — C3411 Malignant neoplasm of upper lobe, right bronchus or lung: Secondary | ICD-10-CM

## 2014-11-25 DIAGNOSIS — R079 Chest pain, unspecified: Secondary | ICD-10-CM

## 2014-11-25 DIAGNOSIS — R110 Nausea: Secondary | ICD-10-CM

## 2014-11-25 DIAGNOSIS — K30 Functional dyspepsia: Secondary | ICD-10-CM | POA: Diagnosis not present

## 2014-11-25 DIAGNOSIS — Z79899 Other long term (current) drug therapy: Secondary | ICD-10-CM | POA: Diagnosis not present

## 2014-11-25 DIAGNOSIS — Z5111 Encounter for antineoplastic chemotherapy: Secondary | ICD-10-CM

## 2014-11-25 DIAGNOSIS — C3491 Malignant neoplasm of unspecified part of right bronchus or lung: Secondary | ICD-10-CM

## 2014-11-25 LAB — COMPREHENSIVE METABOLIC PANEL (CC13)
ALT: 10 U/L (ref 0–55)
AST: 12 U/L (ref 5–34)
Albumin: 2.9 g/dL — ABNORMAL LOW (ref 3.5–5.0)
Alkaline Phosphatase: 105 U/L (ref 40–150)
Anion Gap: 8 mEq/L (ref 3–11)
BILIRUBIN TOTAL: 0.33 mg/dL (ref 0.20–1.20)
BUN: 14 mg/dL (ref 7.0–26.0)
CO2: 26 meq/L (ref 22–29)
Calcium: 9.2 mg/dL (ref 8.4–10.4)
Chloride: 106 mEq/L (ref 98–109)
Creatinine: 0.9 mg/dL (ref 0.7–1.3)
EGFR: 85 mL/min/{1.73_m2} — AB (ref >90)
GLUCOSE: 103 mg/dL (ref 70–140)
Potassium: 3.8 mEq/L (ref 3.5–5.1)
SODIUM: 140 meq/L (ref 136–145)
TOTAL PROTEIN: 6.9 g/dL (ref 6.4–8.3)

## 2014-11-25 LAB — CBC WITH DIFFERENTIAL/PLATELET
BASO%: 0.8 % (ref 0.0–2.0)
BASOS ABS: 0 10*3/uL (ref 0.0–0.1)
EOS%: 2.1 % (ref 0.0–7.0)
Eosinophils Absolute: 0.1 10*3/uL (ref 0.0–0.5)
HCT: 32.1 % — ABNORMAL LOW (ref 38.4–49.9)
HEMOGLOBIN: 10.7 g/dL — AB (ref 13.0–17.1)
LYMPH%: 14.9 % (ref 14.0–49.0)
MCH: 31.7 pg (ref 27.2–33.4)
MCHC: 33.2 g/dL (ref 32.0–36.0)
MCV: 95.4 fL (ref 79.3–98.0)
MONO#: 0.5 10*3/uL (ref 0.1–0.9)
MONO%: 14 % (ref 0.0–14.0)
NEUT%: 68.2 % (ref 39.0–75.0)
NEUTROS ABS: 2.6 10*3/uL (ref 1.5–6.5)
Platelets: 323 10*3/uL (ref 140–400)
RBC: 3.37 10*6/uL — ABNORMAL LOW (ref 4.20–5.82)
RDW: 14.5 % (ref 11.0–14.6)
WBC: 3.8 10*3/uL — AB (ref 4.0–10.3)
lymph#: 0.6 10*3/uL — ABNORMAL LOW (ref 0.9–3.3)

## 2014-11-25 LAB — TSH CHCC: TSH: 1.727 m[IU]/L (ref 0.320–4.118)

## 2014-11-25 MED ORDER — OXYCODONE-ACETAMINOPHEN 5-325 MG PO TABS: 1.0000 | ORAL_TABLET | Freq: Four times a day (QID) | ORAL | Status: DC | PRN

## 2014-11-25 MED ORDER — SODIUM CHLORIDE 0.9 % IJ SOLN
10.0000 mL | INTRAMUSCULAR | Status: DC | PRN
  Administered 2014-11-25: 10 mL
  Filled 2014-11-25: qty 10

## 2014-11-25 MED ORDER — SODIUM CHLORIDE 0.9 % IV SOLN
Freq: Once | INTRAVENOUS | Status: AC
  Administered 2014-11-25: 15:00:00 via INTRAVENOUS
  Filled 2014-11-25: qty 4

## 2014-11-25 MED ORDER — PROCHLORPERAZINE MALEATE 10 MG PO TABS
10.0000 mg | ORAL_TABLET | Freq: Once | ORAL | Status: AC
  Administered 2014-11-25: 10 mg via ORAL

## 2014-11-25 MED ORDER — SODIUM CHLORIDE 0.9 % IV SOLN
1000.0000 mg/m2 | Freq: Once | INTRAVENOUS | Status: AC
  Administered 2014-11-25: 2166 mg via INTRAVENOUS
  Filled 2014-11-25: qty 56.97

## 2014-11-25 MED ORDER — PROCHLORPERAZINE MALEATE 10 MG PO TABS
ORAL_TABLET | ORAL | Status: AC
  Filled 2014-11-25: qty 1

## 2014-11-25 MED ORDER — HEPARIN SOD (PORK) LOCK FLUSH 100 UNIT/ML IV SOLN
500.0000 [IU] | Freq: Once | INTRAVENOUS | Status: AC | PRN
  Administered 2014-11-25: 500 [IU]
  Filled 2014-11-25: qty 5

## 2014-11-25 MED ORDER — SODIUM CHLORIDE 0.9 % IV SOLN
Freq: Once | INTRAVENOUS | Status: AC
  Administered 2014-11-25: 14:00:00 via INTRAVENOUS

## 2014-11-25 NOTE — Progress Notes (Signed)
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  Primary site: Lung (Right)  Staging method: AJCC 7th Edition  Clinical: Stage IIIA (T3, N2, M0) signed by Curt Bears, MD on 05/09/2013 2:56 PM  Summary: Stage IIIA (T3, N2, M0)  PRIOR THERAPY:  1) Concurrent chemoradiation with weekly carboplatin for an AUC of 2 and paclitaxel 45 mg per meter squared. Status post 5 cycles. 2) Bronchoscopy, mediastinoscopy, right posterior lateral thoracotomy with right upper lobectomy, lymph node dissection,  and posterior approach to en bloc chest wall resection, ribs 2, 3, and 4, with positive bronchial resection margin. 3) palliative radiotherapy to the best of resection margin under the care of Dr. Tammi Klippel completed on 10/14/2013. 4) systemic chemotherapy with carboplatin for an AUC of 5 given on day 1 and Abraxane 100 mg/m given days 1, 8 and 15 every 3 weeks status post 3 cycles discontinued 12/31/2013 secondary to disease progression. 5) Immunotherapy with Nivolumab 3 mg/kg given every 2 weeks. Status post 20 cycles, discontinued secondary to disease progression.  CURRENT THERAPY: Systemic chemotherapy with single agent gemcitabine 1000 MG/M2 on days 1 and 8 every 3 weeks is status post 1 cycle.  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   INTERVAL HISTORY: Terry Robles 74 y.o. male returns to the clinic today for follow-up visit. He is currently undergoing systemic chemotherapy with single agent gemcitabine 1000 MG/M2 on days 1 and 8 every 3 weeks. He is status post 1 cycle and tolerated the treatment well except for increasing fatigue and stomach IT but no significant nausea or vomiting. He has no fever or chills. He denied having any significant diarrhea. He denied having any significant chest pain, shortness of breath, cough or hemoptysis. He has no significant weight loss or night sweats. He is here today to start cycle #2 of his systemic chemotherapy.  MEDICAL HISTORY: Past Medical History  Diagnosis Date  . Hypercholesteremia 05/14/2011  . BPH (benign prostatic hyperplasia) 05/14/2011    takes Uroxatral daily  . GERD (gastroesophageal reflux disease)   . Shortness of breath     weather related  . Pneumonia 1965  . Headache(784.0)     occasionally  . Joint pain   . Maintenance chemotherapy   . Urinary frequency   . History of shingles   . Anemia   . Acquired thrombocytopenia   . 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 Cancer     squamous cell carcinoma right superior sulcus  . Lung cancer     right lung    ALLERGIES:  has No Known Allergies.  MEDICATIONS:  Current Outpatient Prescriptions  Medication Sig Dispense Refill  . alfuzosin (UROXATRAL) 10 MG 24 hr tablet Take 10 mg by mouth daily with breakfast.    . clobetasol cream (TEMOVATE) 6.29 % Apply 1 application topically 2 (two) times daily.    . cycloSPORINE (RESTASIS) 0.05 % ophthalmic emulsion 1 drop 2 (two) times daily.    . feeding supplement,  RESOURCE BREEZE, (RESOURCE BREEZE) LIQD Take 1 Container by mouth daily at 3 pm.  0  . HYDROcodone-homatropine (HYCODAN) 5-1.5 MG/5ML syrup Take 5 mLs by mouth every 6 (six) hours as needed for cough. 240 mL 0  . Multiple Vitamin (MULITIVITAMIN WITH MINERALS) TABS Take 1 tablet by mouth daily.    Marland Kitchen oxyCODONE-acetaminophen (PERCOCET/ROXICET) 5-325 MG per tablet Take 1 tablet by mouth every 6 (six) hours as needed for severe pain. 90 tablet 0  . polyethylene glycol (MIRALAX / GLYCOLAX) packet Take 17 g by mouth daily.    . prochlorperazine (COMPAZINE) 5 MG tablet Take 1 tablet (5 mg total) by mouth every 6 (six) hours as needed for nausea or vomiting. 30 tablet 2  . temazepam (RESTORIL) 15 MG capsule Take 1 capsule (15 mg total) by mouth at bedtime as needed for sleep. 30 capsule 0  . docusate sodium (COLACE) 100 MG capsule Take 100 mg by mouth daily.     No current facility-administered medications for this visit.    SURGICAL HISTORY:  Past Surgical History  Procedure Laterality Date  . Other surgical history      benign tumor removed from left leg several years ago  . Other surgical history      had melanoma/squamous cell removed previously per patient  . Tumor excision      Left knee  . Colonoscopy w/ biopsies and polypectomy    . Video bronchoscopy N/A 07/30/2013    Procedure: VIDEO BRONCHOSCOPY;  Surgeon: Grace Isaac, MD;  Location: [facility];  Service: Thoracic;  Laterality: N/A;  . Mediastinoscopy N/A 07/30/2013    Procedure: MEDIASTINOSCOPY;  Surgeon: Grace Isaac, MD;  Location: [facility];  Service: Thoracic;  Laterality: N/A;  . Video assisted thoracoscopy (vats)/wedge resection Right 07/30/2013    Procedure: VIDEO ASSISTED THORACOSCOPY;  Surgeon: Grace Isaac, MD;  Location: [facility];  Service: Thoracic;  Laterality: Right;  . Thoracotomy  07/30/2013    Procedure: THORACOTOMY FOR CHEST WALL RESECTION, RIGHT UPPER LOBECTOMY;  Surgeon: Grace Isaac, MD;  Location: [facility];   Service: Thoracic;;  . Adenoidectomy  as a child  . Tonsillectomy    . Portacath placement Left 10/30/2013    Procedure: INSERTION PORT-A-CATH WITH ULTRASOUND GUIDANCE AND FLUORO;  Surgeon: Grace Isaac, MD;  Location: [facility];  Service: Thoracic;  Laterality: Left;    REVIEW OF SYSTEMS:  A comprehensive review of systems was negative except for: Constitutional: positive for fatigue Gastrointestinal: positive for Bloating and gastric upset   PHYSICAL EXAMINATION: General appearance: alert, cooperative and no distress Head: Normocephalic, without obvious abnormality, atraumatic Neck: no adenopathy, no JVD, supple, symmetrical, trachea midline and thyroid not enlarged, symmetric, no tenderness/mass/nodules Lymph nodes: Cervical, supraclavicular, and axillary nodes normal. Resp: clear to auscultation bilaterally Back: symmetric, no curvature. ROM normal. No CVA tenderness. Cardio: regular rate and rhythm, S1, S2 normal, no murmur, click, rub or gallop GI: soft, non-tender; bowel sounds normal; no masses,  no organomegaly Extremities: extremities normal, atraumatic, no cyanosis or edema  ECOG PERFORMANCE STATUS: 1 - Symptomatic but completely ambulatory  Blood pressure 116/57,  pulse 84, temperature 98.3 F (36.8 C), temperature source Oral, resp. rate 16, height '6\' 2"'$  (1.88 m), weight 193 lb 3.2 oz (87.635 kg), SpO2 100 %.  LABORATORY DATA: Lab Results  Component Value Date   WBC 3.8* 11/25/2014   HGB 10.7* 11/25/2014   HCT 32.1* 11/25/2014   MCV 95.4 11/25/2014   PLT 323 11/25/2014      Chemistry      Component Value Date/Time   NA 139 11/11/2014 1013   NA 137 09/23/2014 1245   K 4.0 11/11/2014 1013   K 4.1 09/23/2014 1245   CL 102 09/23/2014 1245   CO2 26 11/11/2014 1013   CO2 27 09/23/2014 1245   BUN 9.5 11/11/2014 1013   BUN 17 09/23/2014 1245   CREATININE 0.9 11/11/2014 1013   CREATININE 0.94 09/23/2014 1245      Component Value Date/Time   CALCIUM 9.6 11/11/2014  1013   CALCIUM 8.7* 09/23/2014 1245   ALKPHOS 160* 11/11/2014 1013   ALKPHOS 124 09/23/2014 1245   AST 14 11/11/2014 1013   AST 14* 09/23/2014 1245   ALT 19 11/11/2014 1013   ALT 13* 09/23/2014 1245   BILITOT 0.40 11/11/2014 1013   BILITOT 0.6 09/23/2014 1245       RADIOGRAPHIC STUDIES: No results found.  ASSESSMENT AND PLAN: This is a very pleasant 74 years old white male with metastatic non-small cell lung cancer, squamous cell carcinoma. He is status post several chemotherapy regimens including treatment with immunotherapy with Nivolumab status post 20 cycles and tolerating his treatment fairly well but this was discontinued secondary to disease progression. He is currently undergoing systemic chemotherapy with single agent gemcitabine and tolerated the first cycle well except for increasing fatigue and gastric upset. For the abdominal bloating and gastric upset, I advised the patient to take Prilosec at regular basis and also to consider treatment with probiotic and Gas-X. For pain management, the patient was given a refill of Percocet. We will proceed with cycle #2 today as a scheduled. He'll come back for follow-up visit in 3 weeks for reevaluation before starting cycle #3. He was advised to call immediately if he has any concerning symptoms in the interval. The patient voices understanding of current disease status and treatment options and is in agreement with the current care plan.  All questions were answered. The patient knows to call the clinic with any problems, questions or concerns. We can certainly see the patient much sooner if necessary.  Disclaimer: This note was dictated with voice recognition software. Similar sounding words can inadvertently be transcribed and may not be corrected upon review.

## 2014-11-25 NOTE — Patient Instructions (Signed)
[facility] Discharge Instructions for Patients Receiving Chemotherapy  Today you received the following chemotherapy agents: Gemzar  To help prevent nausea and vomiting after your treatment, we encourage you to take your nausea medication as prescribed by your physician.    If you develop nausea and vomiting that is not controlled by your nausea medication, call the clinic.   BELOW ARE SYMPTOMS THAT SHOULD BE REPORTED IMMEDIATELY:  *FEVER GREATER THAN 100.5 F  *CHILLS WITH OR WITHOUT FEVER  NAUSEA AND VOMITING THAT IS NOT CONTROLLED WITH YOUR NAUSEA MEDICATION  *UNUSUAL SHORTNESS OF BREATH  *UNUSUAL BRUISING OR BLEEDING  TENDERNESS IN MOUTH AND THROAT WITH OR WITHOUT PRESENCE OF ULCERS  *URINARY PROBLEMS  *BOWEL PROBLEMS  UNUSUAL RASH Items with * indicate a potential emergency and should be followed up as soon as possible.  Feel free to call the clinic you have any questions or concerns. The clinic phone number is [phone].  Please show the CHEMO ALERT CARD at check-in to the Emergency Department and triage nurse.   

## 2014-11-25 NOTE — Progress Notes (Signed)
1515-pt still nauseated, having dry heaves. Per Diane RN w/ Dr. Julien Nordmann, okay to give Zofran 8 mg IV x 1 dose.

## 2014-11-25 NOTE — Telephone Encounter (Signed)
Gave adn printed appt sched and avs for pt for Sept and OCT °

## 2014-12-02 ENCOUNTER — Ambulatory Visit (HOSPITAL_BASED_OUTPATIENT_CLINIC_OR_DEPARTMENT_OTHER): Payer: Medicare Other

## 2014-12-02 ENCOUNTER — Ambulatory Visit: Payer: Medicare Other

## 2014-12-02 ENCOUNTER — Other Ambulatory Visit (HOSPITAL_BASED_OUTPATIENT_CLINIC_OR_DEPARTMENT_OTHER): Payer: Medicare Other

## 2014-12-02 VITALS — BP 114/56 | HR 87 | Temp 97.7°F | Resp 18

## 2014-12-02 DIAGNOSIS — Z5111 Encounter for antineoplastic chemotherapy: Secondary | ICD-10-CM | POA: Diagnosis present

## 2014-12-02 DIAGNOSIS — C3411 Malignant neoplasm of upper lobe, right bronchus or lung: Secondary | ICD-10-CM | POA: Diagnosis present

## 2014-12-02 DIAGNOSIS — C3490 Malignant neoplasm of unspecified part of unspecified bronchus or lung: Secondary | ICD-10-CM

## 2014-12-02 LAB — COMPREHENSIVE METABOLIC PANEL (CC13)
ALBUMIN: 2.9 g/dL — AB (ref 3.5–5.0)
ALK PHOS: 102 U/L (ref 40–150)
ALT: 16 U/L (ref 0–55)
AST: 16 U/L (ref 5–34)
Anion Gap: 8 mEq/L (ref 3–11)
BILIRUBIN TOTAL: 0.3 mg/dL (ref 0.20–1.20)
BUN: 12 mg/dL (ref 7.0–26.0)
CALCIUM: 9.6 mg/dL (ref 8.4–10.4)
CO2: 28 mEq/L (ref 22–29)
Chloride: 103 mEq/L (ref 98–109)
Creatinine: 0.9 mg/dL (ref 0.7–1.3)
EGFR: 81 mL/min/{1.73_m2} — AB (ref >90)
Glucose: 109 mg/dl (ref 70–140)
POTASSIUM: 4.2 meq/L (ref 3.5–5.1)
Sodium: 139 mEq/L (ref 136–145)
Total Protein: 7 g/dL (ref 6.4–8.3)

## 2014-12-02 LAB — CBC WITH DIFFERENTIAL/PLATELET
BASO%: 1.1 % (ref 0.0–2.0)
BASOS ABS: 0 10*3/uL (ref 0.0–0.1)
EOS ABS: 0 10*3/uL (ref 0.0–0.5)
EOS%: 0.7 % (ref 0.0–7.0)
HEMATOCRIT: 32 % — AB (ref 38.4–49.9)
HEMOGLOBIN: 10.8 g/dL — AB (ref 13.0–17.1)
LYMPH#: 0.4 10*3/uL — AB (ref 0.9–3.3)
LYMPH%: 17.7 % (ref 14.0–49.0)
MCH: 31.9 pg (ref 27.2–33.4)
MCHC: 33.8 g/dL (ref 32.0–36.0)
MCV: 94.6 fL (ref 79.3–98.0)
MONO#: 0.6 10*3/uL (ref 0.1–0.9)
MONO%: 26.3 % — ABNORMAL HIGH (ref 0.0–14.0)
NEUT#: 1.3 10*3/uL — ABNORMAL LOW (ref 1.5–6.5)
NEUT%: 54.2 % (ref 39.0–75.0)
Platelets: 276 10*3/uL (ref 140–400)
RBC: 3.39 10*6/uL — ABNORMAL LOW (ref 4.20–5.82)
RDW: 14.9 % — AB (ref 11.0–14.6)
WBC: 2.3 10*3/uL — ABNORMAL LOW (ref 4.0–10.3)

## 2014-12-02 MED ORDER — SODIUM CHLORIDE 0.9 % IV SOLN
1000.0000 mg/m2 | Freq: Once | INTRAVENOUS | Status: AC
  Administered 2014-12-02: 2166 mg via INTRAVENOUS
  Filled 2014-12-02: qty 56.97

## 2014-12-02 MED ORDER — PROCHLORPERAZINE MALEATE 10 MG PO TABS
10.0000 mg | ORAL_TABLET | Freq: Once | ORAL | Status: AC
  Administered 2014-12-02: 10 mg via ORAL

## 2014-12-02 MED ORDER — HEPARIN SOD (PORK) LOCK FLUSH 100 UNIT/ML IV SOLN
500.0000 [IU] | Freq: Once | INTRAVENOUS | Status: AC | PRN
  Administered 2014-12-02: 500 [IU]
  Filled 2014-12-02: qty 5

## 2014-12-02 MED ORDER — SODIUM CHLORIDE 0.9 % IV SOLN
Freq: Once | INTRAVENOUS | Status: AC
  Administered 2014-12-02: 09:00:00 via INTRAVENOUS

## 2014-12-02 MED ORDER — PROCHLORPERAZINE MALEATE 10 MG PO TABS
ORAL_TABLET | ORAL | Status: AC
  Filled 2014-12-02: qty 1

## 2014-12-02 MED ORDER — SODIUM CHLORIDE 0.9 % IJ SOLN
10.0000 mL | INTRAMUSCULAR | Status: DC | PRN
  Administered 2014-12-02: 10 mL
  Filled 2014-12-02: qty 10

## 2014-12-02 NOTE — Progress Notes (Signed)
OK to treat with ANC 1.3 per Dr. Julien Nordmann.

## 2014-12-02 NOTE — Patient Instructions (Signed)
[facility] Discharge Instructions for Patients Receiving Chemotherapy  Today you received the following chemotherapy agents: Gemzar  To help prevent nausea and vomiting after your treatment, we encourage you to take your nausea medication as prescribed by your physician.    If you develop nausea and vomiting that is not controlled by your nausea medication, call the clinic.   BELOW ARE SYMPTOMS THAT SHOULD BE REPORTED IMMEDIATELY:  *FEVER GREATER THAN 100.5 F  *CHILLS WITH OR WITHOUT FEVER  NAUSEA AND VOMITING THAT IS NOT CONTROLLED WITH YOUR NAUSEA MEDICATION  *UNUSUAL SHORTNESS OF BREATH  *UNUSUAL BRUISING OR BLEEDING  TENDERNESS IN MOUTH AND THROAT WITH OR WITHOUT PRESENCE OF ULCERS  *URINARY PROBLEMS  *BOWEL PROBLEMS  UNUSUAL RASH Items with * indicate a potential emergency and should be followed up as soon as possible.  Feel free to call the clinic you have any questions or concerns. The clinic phone number is [phone].  Please show the CHEMO ALERT CARD at check-in to the Emergency Department and triage nurse.   

## 2014-12-17 ENCOUNTER — Other Ambulatory Visit (HOSPITAL_BASED_OUTPATIENT_CLINIC_OR_DEPARTMENT_OTHER): Payer: Medicare Other

## 2014-12-17 ENCOUNTER — Ambulatory Visit (HOSPITAL_BASED_OUTPATIENT_CLINIC_OR_DEPARTMENT_OTHER): Payer: Medicare Other | Admitting: Nurse Practitioner

## 2014-12-17 ENCOUNTER — Ambulatory Visit (HOSPITAL_BASED_OUTPATIENT_CLINIC_OR_DEPARTMENT_OTHER): Payer: Medicare Other

## 2014-12-17 ENCOUNTER — Other Ambulatory Visit: Payer: Self-pay | Admitting: *Deleted

## 2014-12-17 ENCOUNTER — Ambulatory Visit: Payer: Medicare Other | Admitting: Nutrition

## 2014-12-17 ENCOUNTER — Encounter: Payer: Self-pay | Admitting: Nurse Practitioner

## 2014-12-17 VITALS — BP 103/54 | HR 82 | Temp 97.7°F | Resp 16 | Ht 74.0 in | Wt 192.4 lb

## 2014-12-17 DIAGNOSIS — C3491 Malignant neoplasm of unspecified part of right bronchus or lung: Secondary | ICD-10-CM

## 2014-12-17 DIAGNOSIS — Z5111 Encounter for antineoplastic chemotherapy: Secondary | ICD-10-CM | POA: Diagnosis present

## 2014-12-17 DIAGNOSIS — C3411 Malignant neoplasm of upper lobe, right bronchus or lung: Secondary | ICD-10-CM | POA: Diagnosis present

## 2014-12-17 DIAGNOSIS — C3490 Malignant neoplasm of unspecified part of unspecified bronchus or lung: Secondary | ICD-10-CM | POA: Diagnosis present

## 2014-12-17 LAB — CBC WITH DIFFERENTIAL/PLATELET
BASO%: 0.2 % (ref 0.0–2.0)
BASOS ABS: 0 10*3/uL (ref 0.0–0.1)
EOS ABS: 0.1 10*3/uL (ref 0.0–0.5)
EOS%: 1.8 % (ref 0.0–7.0)
HCT: 33 % — ABNORMAL LOW (ref 38.4–49.9)
HGB: 10.6 g/dL — ABNORMAL LOW (ref 13.0–17.1)
LYMPH%: 11.6 % — AB (ref 14.0–49.0)
MCH: 31.5 pg (ref 27.2–33.4)
MCHC: 32.1 g/dL (ref 32.0–36.0)
MCV: 98.2 fL — AB (ref 79.3–98.0)
MONO#: 0.6 10*3/uL (ref 0.1–0.9)
MONO%: 12.9 % (ref 0.0–14.0)
NEUT#: 3.4 10*3/uL (ref 1.5–6.5)
NEUT%: 73.5 % (ref 39.0–75.0)
PLATELETS: 239 10*3/uL (ref 140–400)
RBC: 3.36 10*6/uL — AB (ref 4.20–5.82)
RDW: 16.9 % — ABNORMAL HIGH (ref 11.0–14.6)
WBC: 4.6 10*3/uL (ref 4.0–10.3)
lymph#: 0.5 10*3/uL — ABNORMAL LOW (ref 0.9–3.3)

## 2014-12-17 LAB — COMPREHENSIVE METABOLIC PANEL (CC13)
ALT: 10 U/L (ref 0–55)
ANION GAP: 9 meq/L (ref 3–11)
AST: 13 U/L (ref 5–34)
Albumin: 3 g/dL — ABNORMAL LOW (ref 3.5–5.0)
Alkaline Phosphatase: 106 U/L (ref 40–150)
BILIRUBIN TOTAL: 0.37 mg/dL (ref 0.20–1.20)
BUN: 14.3 mg/dL (ref 7.0–26.0)
CHLORIDE: 106 meq/L (ref 98–109)
CO2: 25 meq/L (ref 22–29)
Calcium: 9.3 mg/dL (ref 8.4–10.4)
Creatinine: 1 mg/dL (ref 0.7–1.3)
EGFR: 77 mL/min/{1.73_m2} — AB (ref >90)
Glucose: 118 mg/dl (ref 70–140)
POTASSIUM: 4 meq/L (ref 3.5–5.1)
SODIUM: 140 meq/L (ref 136–145)
TOTAL PROTEIN: 6.8 g/dL (ref 6.4–8.3)

## 2014-12-17 MED ORDER — PROCHLORPERAZINE MALEATE 10 MG PO TABS
10.0000 mg | ORAL_TABLET | Freq: Once | ORAL | Status: AC
  Administered 2014-12-17: 10 mg via ORAL

## 2014-12-17 MED ORDER — SODIUM CHLORIDE 0.9 % IV SOLN
1000.0000 mg/m2 | Freq: Once | INTRAVENOUS | Status: AC
  Administered 2014-12-17: 2166 mg via INTRAVENOUS
  Filled 2014-12-17: qty 56.97

## 2014-12-17 MED ORDER — SODIUM CHLORIDE 0.9 % IV SOLN
Freq: Once | INTRAVENOUS | Status: AC
  Administered 2014-12-17: 10:00:00 via INTRAVENOUS

## 2014-12-17 MED ORDER — HYDROCODONE-HOMATROPINE 5-1.5 MG/5ML PO SYRP: 5.0000 mL | ORAL_SOLUTION | Freq: Four times a day (QID) | ORAL | Status: DC | PRN

## 2014-12-17 MED ORDER — HEPARIN SOD (PORK) LOCK FLUSH 100 UNIT/ML IV SOLN
500.0000 [IU] | Freq: Once | INTRAVENOUS | Status: AC | PRN
  Administered 2014-12-17: 500 [IU]
  Filled 2014-12-17: qty 5

## 2014-12-17 MED ORDER — PROCHLORPERAZINE MALEATE 10 MG PO TABS
ORAL_TABLET | ORAL | Status: AC
  Filled 2014-12-17: qty 1

## 2014-12-17 MED ORDER — TEMAZEPAM 15 MG PO CAPS: 15.0000 mg | ORAL_CAPSULE | Freq: Every evening | ORAL | Status: DC | PRN

## 2014-12-17 MED ORDER — PROCHLORPERAZINE MALEATE 5 MG PO TABS: 5.0000 mg | ORAL_TABLET | Freq: Four times a day (QID) | ORAL | Status: DC | PRN

## 2014-12-17 MED ORDER — SODIUM CHLORIDE 0.9 % IJ SOLN
10.0000 mL | INTRAMUSCULAR | Status: DC | PRN
  Administered 2014-12-17: 10 mL
  Filled 2014-12-17: qty 10

## 2014-12-17 NOTE — Addendum Note (Signed)
Addended by: Marcelino Duster on: 12/17/2014 03:09 PM   Modules accepted: Orders

## 2014-12-17 NOTE — Progress Notes (Signed)
Nutrition follow-up completed with patient during chemotherapy for lung cancer. Weight decreased and documented as 192.4 pounds September 21 decreased from 203.6 pounds June 21. Patient reports appetite is minimal He does not care for oral nutrition supplements but will occasionally drink a resource breeze  Nutrition diagnosis: Food and nutrition related knowledge deficit improved.  Intervention: Encouraged patient to try to add one to 2 snacks daily for increased energy and to stimulate appetite. Educated patient to attempt weight maintenance. Questions were answered.  Teach back method used.  Monitoring, evaluation, goals: Patient has had continued weight loss secondary to inadequate oral intake.  Patient will work to maintain current weight.  Next visit: Tuesday, October 18, during infusion.  **Disclaimer: This note was dictated with voice recognition software. Similar sounding words can inadvertently be transcribed and this note may contain transcription errors which may not have been corrected upon publication of note.**

## 2014-12-17 NOTE — Patient Instructions (Signed)
[facility] Discharge Instructions for Patients Receiving Chemotherapy  Today you received the following chemotherapy agents Gemcitabine.   To help prevent nausea and vomiting after your treatment, we encourage you to take your nausea medication as directed.    If you develop nausea and vomiting that is not controlled by your nausea medication, call the clinic.   BELOW ARE SYMPTOMS THAT SHOULD BE REPORTED IMMEDIATELY:  *FEVER GREATER THAN 100.5 F  *CHILLS WITH OR WITHOUT FEVER  NAUSEA AND VOMITING THAT IS NOT CONTROLLED WITH YOUR NAUSEA MEDICATION  *UNUSUAL SHORTNESS OF BREATH  *UNUSUAL BRUISING OR BLEEDING  TENDERNESS IN MOUTH AND THROAT WITH OR WITHOUT PRESENCE OF ULCERS  *URINARY PROBLEMS  *BOWEL PROBLEMS  UNUSUAL RASH Items with * indicate a potential emergency and should be followed up as soon as possible.  Feel free to call the clinic you have any questions or concerns. The clinic phone number is [phone].  Please show the CHEMO ALERT CARD at check-in to the Emergency Department and triage nurse.   

## 2014-12-17 NOTE — Progress Notes (Signed)
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  Primary site: Lung (Right)  Staging method: AJCC 7th Edition  Clinical: Stage IIIA (T3, N2, M0) signed by Curt Bears, MD on 05/09/2013 2:56 PM  Summary: Stage IIIA (T3, N2, M0)  PRIOR THERAPY:  1) Concurrent chemoradiation with weekly carboplatin for an AUC of 2 and paclitaxel 45 mg per meter squared. Status post 5 cycles. 2) Bronchoscopy, mediastinoscopy, right posterior lateral thoracotomy with right upper lobectomy, lymph node dissection,  and posterior approach to en bloc chest wall resection, ribs 2, 3, and 4, with positive bronchial resection margin. 3) palliative radiotherapy to the best of resection margin under the care of Dr. Tammi Klippel completed on 10/14/2013. 4) systemic chemotherapy with carboplatin for an AUC of 5 given on day 1 and Abraxane 100 mg/m given days 1, 8 and 15 every 3 weeks status post 3 cycles discontinued 12/31/2013 secondary to disease progression. 5) Immunotherapy with Nivolumab 3 mg/kg given every 2 weeks. Status post 20 cycles, discontinued secondary to disease progression.  CURRENT THERAPY: Systemic chemotherapy with single agent gemcitabine 1000 MG/M2 on days 1 and 8 every 3 weeks is status post 2 cycles.  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   INTERVAL HISTORY: Terry Robles 74 y.o. male returns to the clinic today for follow-up visit. He is currently undergoing systemic chemotherapy with single agent gemcitabine 1000 MG/M2 on days 1 and 8 every 3 weeks. He is status post 2 cycles. For the most part, he tolerates this well. His main complaint is fatigue, but his gets better nearing the end of the cycle, prior to the start of the next. He denies fevers or chills. His nausea is managed well with compazine. He is moving his bowels well. His appetite is decreased, and he may only eat 2 meals a day. He is down 5lbs this summer. He has shortness of breath with exertion and maintains a dry cough for which he takes hycodan syrup. He denies sputum or hemoptysis. He uses percocet for his right back, shoulder, and rib pain.    MEDICAL HISTORY: Past Medical History  Diagnosis Date  . Hypercholesteremia 05/14/2011  . BPH (benign prostatic hyperplasia) 05/14/2011    takes Uroxatral daily  . GERD (gastroesophageal reflux disease)   . Shortness of breath     weather related  . Pneumonia 1965  . Headache(784.0)     occasionally  . Joint pain   . Maintenance chemotherapy   . Urinary frequency   . History of shingles   . Anemia   . Acquired thrombocytopenia   . Skin cancer  skin cancer squamous on left arm  . Cancer     squamous cell carcinoma right superior sulcus  . Lung cancer     right lung    ALLERGIES:  has No Known Allergies.  MEDICATIONS:  Current Outpatient Prescriptions  Medication Sig Dispense Refill  . alfuzosin (UROXATRAL) 10 MG 24 hr tablet Take 10 mg by mouth daily with breakfast.    . cycloSPORINE (RESTASIS) 0.05 % ophthalmic  emulsion 1 drop 2 (two) times daily.    Marland Kitchen docusate sodium (COLACE) 100 MG capsule Take 100 mg by mouth daily.    . feeding supplement, RESOURCE BREEZE, (RESOURCE BREEZE) LIQD Take 1 Container by mouth daily at 3 pm.  0  . Multiple Vitamin (MULITIVITAMIN WITH MINERALS) TABS Take 1 tablet by mouth daily.    Marland Kitchen oxyCODONE-acetaminophen (PERCOCET/ROXICET) 5-325 MG per tablet Take 1 tablet by mouth every 6 (six) hours as needed for severe pain. 90 tablet 0  . polyethylene glycol (MIRALAX / GLYCOLAX) packet Take 17 g by mouth daily.    . clobetasol cream (TEMOVATE) 4.26 % Apply 1 application topically 2 (two) times daily.    Marland Kitchen HYDROcodone-homatropine (HYCODAN) 5-1.5 MG/5ML syrup Take 5 mLs by mouth every 6 (six) hours as needed for cough. 240 mL 0  . prochlorperazine (COMPAZINE) 5 MG tablet Take 1 tablet (5 mg total) by mouth every 6 (six) hours as needed for nausea or vomiting. 30 tablet 2  . temazepam (RESTORIL) 15 MG capsule Take 1 capsule (15 mg total) by mouth at bedtime as needed for sleep. 30 capsule 0   No current facility-administered medications for this visit.    SURGICAL HISTORY:  Past Surgical History  Procedure Laterality Date  . Other surgical history      benign tumor removed from left leg several years ago  . Other surgical history      had melanoma/squamous cell removed previously per patient  . Tumor excision      Left knee  . Colonoscopy w/ biopsies and polypectomy    . Video bronchoscopy N/A 07/30/2013    Procedure: VIDEO BRONCHOSCOPY;  Surgeon: Grace Isaac, MD;  Location: [facility];  Service: Thoracic;  Laterality: N/A;  . Mediastinoscopy N/A 07/30/2013    Procedure: MEDIASTINOSCOPY;  Surgeon: Grace Isaac, MD;  Location: [facility];  Service: Thoracic;  Laterality: N/A;  . Video assisted thoracoscopy (vats)/wedge resection Right 07/30/2013    Procedure: VIDEO ASSISTED THORACOSCOPY;  Surgeon: Grace Isaac, MD;  Location: [facility];  Service: Thoracic;  Laterality: Right;  .  Thoracotomy  07/30/2013    Procedure: THORACOTOMY FOR CHEST WALL RESECTION, RIGHT UPPER LOBECTOMY;  Surgeon: Grace Isaac, MD;  Location: [facility];  Service: Thoracic;;  . Adenoidectomy  as a child  . Tonsillectomy    . Portacath placement Left 10/30/2013    Procedure: INSERTION PORT-A-CATH WITH ULTRASOUND GUIDANCE AND FLUORO;  Surgeon: Grace Isaac, MD;  Location: [facility];  Service: Thoracic;  Laterality: Left;    REVIEW OF SYSTEMS:  A comprehensive review of systems was negative except for: Constitutional: positive for fatigue Respiratory: positive for cough Gastrointestinal: positive for nausea   PHYSICAL EXAMINATION: General appearance: alert, cooperative and no distress Head: Normocephalic, without obvious abnormality, atraumatic Neck: no adenopathy, no JVD, supple, symmetrical, trachea midline and thyroid not enlarged, symmetric, no tenderness/mass/nodules Lymph nodes: Cervical, supraclavicular, and axillary nodes normal. Resp: clear to auscultation bilaterally Back: symmetric, no curvature. ROM normal. No CVA tenderness. Cardio: regular rate and rhythm, S1, S2  normal, no murmur, click, rub or gallop GI: soft, non-tender; bowel sounds normal; no masses,  no organomegaly Extremities: extremities normal, atraumatic, no cyanosis or edema  ECOG PERFORMANCE STATUS: 1 - Symptomatic but completely ambulatory  Blood pressure 103/54, pulse 82, temperature 97.7 F (36.5 C), temperature source Oral, resp. rate 16, height '6\' 2"'$  (1.88 m), weight 192 lb 6.4 oz (87.272 kg), SpO2 98 %.  LABORATORY DATA: Lab Results  Component Value Date   WBC 4.6 12/17/2014   HGB 10.6* 12/17/2014   HCT 33.0* 12/17/2014   MCV 98.2* 12/17/2014   PLT 239 12/17/2014      Chemistry      Component Value Date/Time   NA 140 12/17/2014 0816   NA 137 09/23/2014 1245   K 4.0 12/17/2014 0816   K 4.1 09/23/2014 1245   CL 102 09/23/2014 1245   CO2 25 12/17/2014 0816   CO2 27 09/23/2014 1245   BUN 14.3  12/17/2014 0816   BUN 17 09/23/2014 1245   CREATININE 1.0 12/17/2014 0816   CREATININE 0.94 09/23/2014 1245      Component Value Date/Time   CALCIUM 9.3 12/17/2014 0816   CALCIUM 8.7* 09/23/2014 1245   ALKPHOS 106 12/17/2014 0816   ALKPHOS 124 09/23/2014 1245   AST 13 12/17/2014 0816   AST 14* 09/23/2014 1245   ALT 10 12/17/2014 0816   ALT 13* 09/23/2014 1245   BILITOT 0.37 12/17/2014 0816   BILITOT 0.6 09/23/2014 1245       RADIOGRAPHIC STUDIES: No results found.  ASSESSMENT AND PLAN: This is a very pleasant 74 years old white male with metastatic non-small cell lung cancer, squamous cell carcinoma. He is status post several chemotherapy regimens including treatment with immunotherapy with Nivolumab status post 20 cycles and tolerating his treatment fairly well but this was discontinued secondary to disease progression. He is currently undergoing systemic chemotherapy with single agent gemcitabine. He is status post 2 cycles His restoril and hycodan syrup were refilled as requested. We will proceed with cycle #3 today as a scheduled. He'll come back for follow-up visit in 3 weeks for reevaluation before starting cycle #4. He was advised to call immediately if he has any concerning symptoms in the interval. The patient voices understanding of current disease status and treatment options and is in agreement with the current care plan.  All questions were answered. The patient knows to call the clinic with any problems, questions or concerns. We can certainly see the patient much sooner if necessary.  Laurie Panda, NP 12/17/2014

## 2014-12-23 ENCOUNTER — Other Ambulatory Visit: Payer: Self-pay | Admitting: *Deleted

## 2014-12-23 DIAGNOSIS — C3411 Malignant neoplasm of upper lobe, right bronchus or lung: Secondary | ICD-10-CM

## 2014-12-24 ENCOUNTER — Ambulatory Visit (HOSPITAL_BASED_OUTPATIENT_CLINIC_OR_DEPARTMENT_OTHER): Payer: Medicare Other

## 2014-12-24 ENCOUNTER — Other Ambulatory Visit (HOSPITAL_BASED_OUTPATIENT_CLINIC_OR_DEPARTMENT_OTHER): Payer: Medicare Other

## 2014-12-24 DIAGNOSIS — C3411 Malignant neoplasm of upper lobe, right bronchus or lung: Secondary | ICD-10-CM | POA: Diagnosis present

## 2014-12-24 DIAGNOSIS — Z5111 Encounter for antineoplastic chemotherapy: Secondary | ICD-10-CM | POA: Diagnosis present

## 2014-12-24 LAB — COMPREHENSIVE METABOLIC PANEL (CC13)
ALT: 18 U/L (ref 0–55)
ANION GAP: 7 meq/L (ref 3–11)
AST: 16 U/L (ref 5–34)
Albumin: 3 g/dL — ABNORMAL LOW (ref 3.5–5.0)
Alkaline Phosphatase: 136 U/L (ref 40–150)
BUN: 13.2 mg/dL (ref 7.0–26.0)
CALCIUM: 9.6 mg/dL (ref 8.4–10.4)
CHLORIDE: 103 meq/L (ref 98–109)
CO2: 29 meq/L (ref 22–29)
CREATININE: 1 mg/dL (ref 0.7–1.3)
EGFR: 75 mL/min/{1.73_m2} — AB (ref >90)
Glucose: 106 mg/dl (ref 70–140)
Potassium: 4.3 mEq/L (ref 3.5–5.1)
Sodium: 138 mEq/L (ref 136–145)
Total Bilirubin: 0.35 mg/dL (ref 0.20–1.20)
Total Protein: 7.1 g/dL (ref 6.4–8.3)

## 2014-12-24 LAB — CBC WITH DIFFERENTIAL/PLATELET
BASO%: 1 % (ref 0.0–2.0)
BASOS ABS: 0 10*3/uL (ref 0.0–0.1)
EOS ABS: 0 10*3/uL (ref 0.0–0.5)
EOS%: 1.2 % (ref 0.0–7.0)
HEMATOCRIT: 33 % — AB (ref 38.4–49.9)
HGB: 10.9 g/dL — ABNORMAL LOW (ref 13.0–17.1)
LYMPH#: 0.4 10*3/uL — AB (ref 0.9–3.3)
LYMPH%: 16.8 % (ref 14.0–49.0)
MCH: 31.6 pg (ref 27.2–33.4)
MCHC: 33 g/dL (ref 32.0–36.0)
MCV: 95.8 fL (ref 79.3–98.0)
MONO#: 0.7 10*3/uL (ref 0.1–0.9)
MONO%: 25.4 % — ABNORMAL HIGH (ref 0.0–14.0)
NEUT#: 1.5 10*3/uL (ref 1.5–6.5)
NEUT%: 55.6 % (ref 39.0–75.0)
PLATELETS: 299 10*3/uL (ref 140–400)
RBC: 3.44 10*6/uL — AB (ref 4.20–5.82)
RDW: 17.4 % — ABNORMAL HIGH (ref 11.0–14.6)
WBC: 2.6 10*3/uL — ABNORMAL LOW (ref 4.0–10.3)

## 2014-12-24 MED ORDER — SODIUM CHLORIDE 0.9 % IJ SOLN
10.0000 mL | INTRAMUSCULAR | Status: DC | PRN
  Administered 2014-12-24: 10 mL
  Filled 2014-12-24: qty 10

## 2014-12-24 MED ORDER — HEPARIN SOD (PORK) LOCK FLUSH 100 UNIT/ML IV SOLN
500.0000 [IU] | Freq: Once | INTRAVENOUS | Status: AC | PRN
  Administered 2014-12-24: 500 [IU]
  Filled 2014-12-24: qty 5

## 2014-12-24 MED ORDER — PROCHLORPERAZINE MALEATE 10 MG PO TABS
ORAL_TABLET | ORAL | Status: AC
Start: 2014-12-24 — End: 2014-12-24
  Filled 2014-12-24: qty 1

## 2014-12-24 MED ORDER — SODIUM CHLORIDE 0.9 % IV SOLN
2200.0000 mg | Freq: Once | INTRAVENOUS | Status: AC
  Administered 2014-12-24: 2200 mg via INTRAVENOUS
  Filled 2014-12-24: qty 57.86

## 2014-12-24 MED ORDER — SODIUM CHLORIDE 0.9 % IV SOLN
Freq: Once | INTRAVENOUS | Status: AC
  Administered 2014-12-24: 09:00:00 via INTRAVENOUS

## 2014-12-24 MED ORDER — PROCHLORPERAZINE MALEATE 10 MG PO TABS
10.0000 mg | ORAL_TABLET | Freq: Once | ORAL | Status: AC
  Administered 2014-12-24: 10 mg via ORAL

## 2014-12-24 NOTE — Patient Instructions (Signed)
[facility] Discharge Instructions for Patients Receiving Chemotherapy  Today you received the following chemotherapy agents Gemzar  To help prevent nausea and vomiting after your treatment, we encourage you to take your nausea medication Compazine 5 mg every 6 hours as needed.  If you develop nausea and vomiting that is not controlled by your nausea medication, call the clinic.   BELOW ARE SYMPTOMS THAT SHOULD BE REPORTED IMMEDIATELY:  *FEVER GREATER THAN 100.5 F  *CHILLS WITH OR WITHOUT FEVER  NAUSEA AND VOMITING THAT IS NOT CONTROLLED WITH YOUR NAUSEA MEDICATION  *UNUSUAL SHORTNESS OF BREATH  *UNUSUAL BRUISING OR BLEEDING  TENDERNESS IN MOUTH AND THROAT WITH OR WITHOUT PRESENCE OF ULCERS  *URINARY PROBLEMS  *BOWEL PROBLEMS  UNUSUAL RASH Items with * indicate a potential emergency and should be followed up as soon as possible.  Feel free to call the clinic you have any questions or concerns. The clinic phone number is [phone].  Please show the [facility] at check-in to the Emergency Department and triage nurse.

## 2014-12-24 NOTE — Progress Notes (Signed)
Per Dr. Julien Nordmann; OK to proceed with treatment today with WBC 2.6; do not have to wait for CMET results per MD

## 2015-01-02 ENCOUNTER — Ambulatory Visit (HOSPITAL_COMMUNITY)
Admission: RE | Admit: 2015-01-02 | Discharge: 2015-01-02 | Disposition: A | Discharge status: Home / Self Care | Payer: Medicare Other | Source: Ambulatory Visit | Attending: Nurse Practitioner | Admitting: Nurse Practitioner

## 2015-01-02 ENCOUNTER — Encounter (HOSPITAL_COMMUNITY): Payer: Self-pay

## 2015-01-02 DIAGNOSIS — R918 Other nonspecific abnormal finding of lung field: Secondary | ICD-10-CM | POA: Diagnosis not present

## 2015-01-02 DIAGNOSIS — J90 Pleural effusion, not elsewhere classified: Secondary | ICD-10-CM | POA: Diagnosis not present

## 2015-01-02 DIAGNOSIS — I700 Atherosclerosis of aorta: Secondary | ICD-10-CM | POA: Insufficient documentation

## 2015-01-02 DIAGNOSIS — J432 Centrilobular emphysema: Secondary | ICD-10-CM | POA: Insufficient documentation

## 2015-01-02 DIAGNOSIS — C3491 Malignant neoplasm of unspecified part of right bronchus or lung: Secondary | ICD-10-CM | POA: Insufficient documentation

## 2015-01-02 DIAGNOSIS — Z902 Acquired absence of lung [part of]: Secondary | ICD-10-CM | POA: Diagnosis not present

## 2015-01-02 DIAGNOSIS — N400 Enlarged prostate without lower urinary tract symptoms: Secondary | ICD-10-CM | POA: Diagnosis not present

## 2015-01-02 DIAGNOSIS — C3490 Malignant neoplasm of unspecified part of unspecified bronchus or lung: Secondary | ICD-10-CM

## 2015-01-02 MED ORDER — IOHEXOL 300 MG/ML  SOLN
100.0000 mL | Freq: Once | INTRAMUSCULAR | Status: AC | PRN
  Administered 2015-01-02: 100 mL via INTRAVENOUS

## 2015-01-02 MED ORDER — IOHEXOL 300 MG/ML  SOLN
50.0000 mL | Freq: Once | INTRAMUSCULAR | Status: AC | PRN
  Administered 2015-01-02: 50 mL via ORAL

## 2015-01-06 ENCOUNTER — Other Ambulatory Visit (HOSPITAL_BASED_OUTPATIENT_CLINIC_OR_DEPARTMENT_OTHER): Payer: Medicare Other

## 2015-01-06 ENCOUNTER — Ambulatory Visit (HOSPITAL_BASED_OUTPATIENT_CLINIC_OR_DEPARTMENT_OTHER): Payer: Medicare Other

## 2015-01-06 ENCOUNTER — Encounter: Payer: Self-pay | Admitting: Internal Medicine

## 2015-01-06 ENCOUNTER — Telehealth: Payer: Self-pay | Admitting: Internal Medicine

## 2015-01-06 ENCOUNTER — Ambulatory Visit (HOSPITAL_BASED_OUTPATIENT_CLINIC_OR_DEPARTMENT_OTHER): Payer: Medicare Other | Admitting: Internal Medicine

## 2015-01-06 VITALS — BP 102/64 | HR 85 | Temp 98.0°F | Resp 18 | Ht 74.0 in | Wt 188.4 lb

## 2015-01-06 DIAGNOSIS — Z79899 Other long term (current) drug therapy: Secondary | ICD-10-CM

## 2015-01-06 DIAGNOSIS — J90 Pleural effusion, not elsewhere classified: Secondary | ICD-10-CM

## 2015-01-06 DIAGNOSIS — R079 Chest pain, unspecified: Secondary | ICD-10-CM

## 2015-01-06 DIAGNOSIS — Z5111 Encounter for antineoplastic chemotherapy: Secondary | ICD-10-CM

## 2015-01-06 DIAGNOSIS — C3411 Malignant neoplasm of upper lobe, right bronchus or lung: Secondary | ICD-10-CM

## 2015-01-06 DIAGNOSIS — R530 Neoplastic (malignant) related fatigue: Secondary | ICD-10-CM

## 2015-01-06 DIAGNOSIS — C3491 Malignant neoplasm of unspecified part of right bronchus or lung: Secondary | ICD-10-CM

## 2015-01-06 DIAGNOSIS — Z5112 Encounter for antineoplastic immunotherapy: Secondary | ICD-10-CM

## 2015-01-06 LAB — CBC WITH DIFFERENTIAL/PLATELET
BASO%: 0.2 % (ref 0.0–2.0)
Basophils Absolute: 0 10*3/uL (ref 0.0–0.1)
EOS%: 2.7 % (ref 0.0–7.0)
Eosinophils Absolute: 0.1 10*3/uL (ref 0.0–0.5)
HCT: 36.9 % — ABNORMAL LOW (ref 38.4–49.9)
HEMOGLOBIN: 11.9 g/dL — AB (ref 13.0–17.1)
LYMPH%: 9.4 % — AB (ref 14.0–49.0)
MCH: 32 pg (ref 27.2–33.4)
MCHC: 32.2 g/dL (ref 32.0–36.0)
MCV: 99.2 fL — ABNORMAL HIGH (ref 79.3–98.0)
MONO#: 0.6 10*3/uL (ref 0.1–0.9)
MONO%: 12.4 % (ref 0.0–14.0)
NEUT%: 75.3 % — ABNORMAL HIGH (ref 39.0–75.0)
NEUTROS ABS: 3.7 10*3/uL (ref 1.5–6.5)
Platelets: 212 10*3/uL (ref 140–400)
RBC: 3.72 10*6/uL — AB (ref 4.20–5.82)
RDW: 18 % — AB (ref 11.0–14.6)
WBC: 4.9 10*3/uL (ref 4.0–10.3)
lymph#: 0.5 10*3/uL — ABNORMAL LOW (ref 0.9–3.3)

## 2015-01-06 LAB — COMPREHENSIVE METABOLIC PANEL (CC13)
ALT: 101 U/L — ABNORMAL HIGH (ref 0–55)
AST: 50 U/L — ABNORMAL HIGH (ref 5–34)
Albumin: 3.2 g/dL — ABNORMAL LOW (ref 3.5–5.0)
Alkaline Phosphatase: 277 U/L — ABNORMAL HIGH (ref 40–150)
Anion Gap: 10 mEq/L (ref 3–11)
BUN: 13.9 mg/dL (ref 7.0–26.0)
CO2: 26 mEq/L (ref 22–29)
Calcium: 9.8 mg/dL (ref 8.4–10.4)
Chloride: 105 mEq/L (ref 98–109)
Creatinine: 1 mg/dL (ref 0.7–1.3)
EGFR: 73 mL/min/{1.73_m2} — ABNORMAL LOW (ref >90)
Glucose: 115 mg/dl (ref 70–140)
Potassium: 4.3 mEq/L (ref 3.5–5.1)
Sodium: 140 mEq/L (ref 136–145)
Total Bilirubin: 0.46 mg/dL (ref 0.20–1.20)
Total Protein: 7.4 g/dL (ref 6.4–8.3)

## 2015-01-06 LAB — TSH CHCC: TSH: 2.114 m[IU]/L (ref 0.320–4.118)

## 2015-01-06 MED ORDER — SODIUM CHLORIDE 0.9 % IV SOLN
2100.0000 mg | Freq: Once | INTRAVENOUS | Status: AC
  Administered 2015-01-06: 2100 mg via INTRAVENOUS
  Filled 2015-01-06: qty 55.23

## 2015-01-06 MED ORDER — OXYCODONE-ACETAMINOPHEN 5-325 MG PO TABS: 1.0000 | ORAL_TABLET | Freq: Four times a day (QID) | ORAL | Status: DC | PRN

## 2015-01-06 MED ORDER — SODIUM CHLORIDE 0.9 % IJ SOLN
10.0000 mL | INTRAMUSCULAR | Status: DC | PRN
  Administered 2015-01-06: 10 mL
  Filled 2015-01-06: qty 10

## 2015-01-06 MED ORDER — PROCHLORPERAZINE MALEATE 10 MG PO TABS
ORAL_TABLET | ORAL | Status: AC
  Filled 2015-01-06: qty 1

## 2015-01-06 MED ORDER — HEPARIN SOD (PORK) LOCK FLUSH 100 UNIT/ML IV SOLN
500.0000 [IU] | Freq: Once | INTRAVENOUS | Status: AC | PRN
  Administered 2015-01-06: 500 [IU]
  Filled 2015-01-06: qty 5

## 2015-01-06 MED ORDER — PROCHLORPERAZINE MALEATE 10 MG PO TABS
10.0000 mg | ORAL_TABLET | Freq: Once | ORAL | Status: AC
  Administered 2015-01-06: 10 mg via ORAL

## 2015-01-06 MED ORDER — SODIUM CHLORIDE 0.9 % IV SOLN
Freq: Once | INTRAVENOUS | Status: AC
  Administered 2015-01-06: 10:00:00 via INTRAVENOUS

## 2015-01-06 NOTE — Progress Notes (Signed)
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  Primary site: Lung (Right)  Staging method: AJCC 7th Edition  Clinical: Stage IIIA (T3, N2, M0) signed by Curt Bears, MD on 05/09/2013 2:56 PM  Summary: Stage IIIA (T3, N2, M0)  PRIOR THERAPY:  1) Concurrent chemoradiation with weekly carboplatin for an AUC of 2 and paclitaxel 45 mg per meter squared. Status post 5 cycles. 2) Bronchoscopy, mediastinoscopy, right posterior lateral thoracotomy with right upper lobectomy, lymph node dissection,  and posterior approach to en bloc chest wall resection, ribs 2, 3, and 4, with positive bronchial resection margin. 3) palliative radiotherapy to the best of resection margin under the care of Dr. Tammi Klippel completed on 10/14/2013. 4) systemic chemotherapy with carboplatin for an AUC of 5 given on day 1 and Abraxane 100 mg/m given days 1, 8 and 15 every 3 weeks status post 3 cycles discontinued 12/31/2013 secondary to disease progression. 5) Immunotherapy with Nivolumab 3 mg/kg given every 2 weeks. Status post 20 cycles, discontinued secondary to disease progression.  CURRENT THERAPY: Systemic chemotherapy with single agent gemcitabine 1000 MG/M2 on days 1 and 8 every 3 weeks is status post 3 cycles.  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   INTERVAL HISTORY: Terry Robles 74 y.o. male returns to the clinic today for follow-up visit. He is currently undergoing systemic chemotherapy with single agent gemcitabine 1000 MG/M2 on days 1 and 8 every 3 weeks. He is status post 3 cycle and tolerated the treatment well. He has no significant nausea or vomiting. He has no fever or chills. He denied having any significant diarrhea. He denied having any significant shortness of breath, cough or hemoptysis but has occasional right-sided chest pain. He has no significant weight loss or night sweats. He had repeat CT scan of the chest, abdomen and pelvis performed recently and he is here for evaluation and discussion of the scan results. Have some upset stomach and diarrhea after the oral contrast but this is completely resolved.  MEDICAL HISTORY: Past Medical History  Diagnosis Date  . Hypercholesteremia 05/14/2011  . BPH (benign prostatic hyperplasia) 05/14/2011    takes Uroxatral daily  . GERD (gastroesophageal reflux disease)   . Shortness of breath     weather related  . Pneumonia 1965  . Headache(784.0)     occasionally  . Joint pain   . Maintenance chemotherapy   . Urinary frequency   . History of shingles   . Anemia   . Acquired thrombocytopenia   . 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 Cancer ([facility])  squamous cell carcinoma right superior sulcus  . Lung cancer ([facility] [place] [facility])     right lung    ALLERGIES:  has No Known Allergies.  MEDICATIONS:  Current Outpatient Prescriptions  Medication Sig Dispense Refill  . alfuzosin (UROXATRAL) 10 MG 24 hr tablet Take 10 mg by mouth daily with breakfast.    . clobetasol cream (TEMOVATE)  8.93 % Apply 1 application topically 2 (two) times daily.    . cycloSPORINE (RESTASIS) 0.05 % ophthalmic emulsion 1 drop 2 (two) times daily.    Marland Kitchen docusate sodium (COLACE) 100 MG capsule Take 100 mg by mouth daily.    . feeding supplement, RESOURCE BREEZE, (RESOURCE BREEZE) LIQD Take 1 Container by mouth daily at 3 pm.  0  . HYDROcodone-homatropine (HYCODAN) 5-1.5 MG/5ML syrup Take 5 mLs by mouth every 6 (six) hours as needed for cough. 240 mL 0  . Multiple Vitamin (MULITIVITAMIN WITH MINERALS) TABS Take 1 tablet by mouth daily.    Marland Kitchen oxyCODONE-acetaminophen (PERCOCET/ROXICET) 5-325 MG per tablet Take 1 tablet by mouth every 6 (six) hours as needed for severe pain. 90 tablet 0  . polyethylene glycol (MIRALAX / GLYCOLAX) packet Take 17 g by mouth daily.    . prochlorperazine (COMPAZINE) 5 MG tablet Take 1 tablet (5 mg total) by mouth every 6 (six) hours as needed for nausea or vomiting. 30 tablet 2  . temazepam (RESTORIL) 15 MG capsule Take 1 capsule (15 mg total) by mouth at bedtime as needed for sleep. 30 capsule 0   No current facility-administered medications for this visit.    SURGICAL HISTORY:  Past Surgical History  Procedure Laterality Date  . Other surgical history      benign tumor removed from left leg several years ago  . Other surgical history      had melanoma/squamous cell removed previously per patient  . Tumor excision      Left knee  . Colonoscopy w/ biopsies and polypectomy    . Video bronchoscopy N/A 07/30/2013    Procedure: VIDEO BRONCHOSCOPY;  Surgeon: Grace Isaac, MD;  Location: [facility];  Service: Thoracic;  Laterality: N/A;  . Mediastinoscopy N/A 07/30/2013    Procedure: MEDIASTINOSCOPY;  Surgeon: Grace Isaac, MD;  Location: [facility];  Service: Thoracic;  Laterality: N/A;  . Video assisted thoracoscopy (vats)/wedge resection Right 07/30/2013    Procedure: VIDEO ASSISTED THORACOSCOPY;  Surgeon: Grace Isaac, MD;  Location: [facility];  Service: Thoracic;   Laterality: Right;  . Thoracotomy  07/30/2013    Procedure: THORACOTOMY FOR CHEST WALL RESECTION, RIGHT UPPER LOBECTOMY;  Surgeon: Grace Isaac, MD;  Location: [facility];  Service: Thoracic;;  . Adenoidectomy  as a child  . Tonsillectomy    . Portacath placement Left 10/30/2013    Procedure: INSERTION PORT-A-CATH WITH ULTRASOUND GUIDANCE AND FLUORO;  Surgeon: Grace Isaac, MD;  Location: [facility];  Service: Thoracic;  Laterality: Left;    REVIEW OF SYSTEMS:  Constitutional: positive for fatigue Eyes: negative Ears, nose, mouth, throat, and face: negative Respiratory: positive for pleurisy/chest pain Cardiovascular: negative Gastrointestinal: negative Genitourinary:negative Integument/breast: negative Hematologic/lymphatic: negative Musculoskeletal:negative Neurological: negative Behavioral/Psych: negative Endocrine: negative Allergic/Immunologic: negative   PHYSICAL EXAMINATION: General appearance: alert, cooperative, fatigued and no distress Head: Normocephalic, without obvious abnormality, atraumatic Neck: no adenopathy, no JVD, supple, symmetrical, trachea midline and thyroid not enlarged, symmetric, no tenderness/mass/nodules Lymph nodes: Cervical, supraclavicular, and axillary nodes normal. Resp: diminished breath sounds RLL Back: symmetric, no curvature. ROM normal. No CVA tenderness. Cardio: regular rate  and rhythm, S1, S2 normal, no murmur, click, rub or gallop GI: soft, non-tender; bowel sounds normal; no masses,  no organomegaly Extremities: extremities normal, atraumatic, no cyanosis or edema Neurologic: Alert and oriented X 3, normal strength and tone. Normal symmetric reflexes. Normal coordination and gait  ECOG PERFORMANCE STATUS: 1 - Symptomatic but completely ambulatory  Blood pressure 102/64, pulse 85, temperature 98 F (36.7 C), temperature source Oral, resp. rate 18, height '6\' 2"'$  (1.88 m), weight 188 lb 6.4 oz (85.458 kg), SpO2 100 %.  LABORATORY DATA: Lab  Results  Component Value Date   WBC 4.9 01/06/2015   HGB 11.9* 01/06/2015   HCT 36.9* 01/06/2015   MCV 99.2* 01/06/2015   PLT 212 01/06/2015      Chemistry      Component Value Date/Time   NA 138 12/24/2014 0805   NA 137 09/23/2014 1245   K 4.3 12/24/2014 0805   K 4.1 09/23/2014 1245   CL 102 09/23/2014 1245   CO2 29 12/24/2014 0805   CO2 27 09/23/2014 1245   BUN 13.2 12/24/2014 0805   BUN 17 09/23/2014 1245   CREATININE 1.0 12/24/2014 0805   CREATININE 0.94 09/23/2014 1245      Component Value Date/Time   CALCIUM 9.6 12/24/2014 0805   CALCIUM 8.7* 09/23/2014 1245   ALKPHOS 136 12/24/2014 0805   ALKPHOS 124 09/23/2014 1245   AST 16 12/24/2014 0805   AST 14* 09/23/2014 1245   ALT 18 12/24/2014 0805   ALT 13* 09/23/2014 1245   BILITOT 0.35 12/24/2014 0805   BILITOT 0.6 09/23/2014 1245       RADIOGRAPHIC STUDIES: Ct Chest W Contrast  01/02/2015   CLINICAL DATA:  Subsequent encounter for right lung cancer diagnosed 04/25/2013 and status post right upper lobectomy.  EXAM: CT CHEST, ABDOMEN, AND PELVIS WITH CONTRAST  TECHNIQUE: Multidetector CT imaging of the chest, abdomen and pelvis was performed following the standard protocol during bolus administration of intravenous contrast.  CONTRAST:  137m OMNIPAQUE IOHEXOL 300 MG/ML  SOLN  COMPARISON:  10/24/2014.  FINDINGS: CT CHEST FINDINGS  Mediastinum/Lymph Nodes: The tip of the left-sided Port-A-Cath is positioned in the distal SVC, near the junction with the RA. There is no axillary lymphadenopathy. The small right axillary lymph nodes described previously have decreased in the interval.  The left superior mediastinal/thoracic inlet lymph node measured previously at 3.1 x 2.5 cm now measures 3.1 x 2.6 cm. No evidence for paratracheal, precarinal, or subcarinal lymphadenopathy. The previously described cluster of lymph nodes in the right pericardial phrenic region has decreased in the interval. The 10 mm short axis lymph node seen  previously is now 4 mm (image 50 series 2). No evidence for left hilar lymphadenopathy. Post treatment changes are seen in the right hilum.  Lungs/Pleura: Trace amount of dependent mucus noted in the trachea. Prominent volume loss in the right hemi thorax is stable. 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 Tiny nodule along the cranial aspect of the aerated right lung (image 11 series 4) is stable. No new or progressive disease in the right hemi thorax. The large right pleural effusion is stable. Tiny left-sided lung nodules seen on images 21, 25, and 32 of series 4 are stable. No new or progressing left-sided pulmonary nodule.  Changes of centrilobular and paraseptal emphysema noted in the lungs bilaterally.  Musculoskeletal: Bones are diffusely demineralized. On bloc resection of the upper right posterior lateral chest wall noted.  CT ABDOMEN PELVIS FINDINGS  Hepatobiliary: No focal abnormality  within the liver parenchyma. Multiple small stones are seen in the gallbladder. No intrahepatic or extrahepatic biliary dilation.  Pancreas: No focal mass lesion. No dilatation of the main duct. No intraparenchymal cyst. No peripancreatic edema.  Spleen: No splenomegaly. No focal mass lesion.  Adrenals/Urinary Tract: No adrenal nodule or mass. No enhancing renal mass. No hydronephrosis. No evidence for hydroureter. The urinary bladder appears normal for the degree of distention.  Stomach/Bowel: Stomach is nondistended. No gastric wall thickening. No evidence of outlet obstruction. Duodenum is normally positioned as is the ligament of Treitz. No small bowel wall thickening. No small bowel dilatation. The terminal ileum is normal. The appendix is normal. No gross colonic mass. No colonic wall thickening. No substantial diverticular change.  Vascular/Lymphatic: There is abdominal aortic atherosclerosis without aneurysm. There is no gastrohepatic or hepatoduodenal ligament lymphadenopathy. No intraperitoneal or  retroperitoneal lymphadenopy. No pelvic sidewall lymphadenopathy.  Reproductive: Prostate gland is enlarged.  Other: No intraperitoneal free fluid.  Musculoskeletal: Bone windows reveal no worrisome lytic or sclerotic osseous lesions.  IMPRESSION: 1. Dominant suspicious lesion in the chest is the left superior mediastinal/thoracic inlet lymph node. This is stable in the interval. The right axillary and right pericardial phrenic lymph nodes described on the previous study are stable to decreased in the interval. No new or progressive findings in the chest on today's study. 2. Volume loss in the right hemi thorax with post treatment changes. Large right pleural effusion is stable. Scattered tiny bilateral pulmonary nodules are unchanged. No new or progressive pulmonary nodule evident. 3. Emphysema. 4. Abdominal aortic atherosclerosis.   Electronically Signed   By: Misty Stanley M.D.   On: 01/02/2015 11:37   Ct Abdomen Pelvis W Contrast  01/02/2015   CLINICAL DATA:  Subsequent encounter for right lung cancer diagnosed 04/25/2013 and status post right upper lobectomy.  EXAM: CT CHEST, ABDOMEN, AND PELVIS WITH CONTRAST  TECHNIQUE: Multidetector CT imaging of the chest, abdomen and pelvis was performed following the standard protocol during bolus administration of intravenous contrast.  CONTRAST:  161m OMNIPAQUE IOHEXOL 300 MG/ML  SOLN  COMPARISON:  10/24/2014.  FINDINGS: CT CHEST FINDINGS  Mediastinum/Lymph Nodes: The tip of the left-sided Port-A-Cath is positioned in the distal SVC, near the junction with the RA. There is no axillary lymphadenopathy. The small right axillary lymph nodes described previously have decreased in the interval.  The left superior mediastinal/thoracic inlet lymph node measured previously at 3.1 x 2.5 cm now measures 3.1 x 2.6 cm. No evidence for paratracheal, precarinal, or subcarinal lymphadenopathy. The previously described cluster of lymph nodes in the right pericardial phrenic region  has decreased in the interval. The 10 mm short axis lymph node seen previously is now 4 mm (image 50 series 2). No evidence for left hilar lymphadenopathy. Post treatment changes are seen in the right hilum.  Lungs/Pleura: Trace amount of dependent mucus noted in the trachea. Prominent volume loss in the right hemi thorax is stable. 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 Tiny nodule along the cranial aspect of the aerated right lung (image 11 series 4) is stable. No new or progressive disease in the right hemi thorax. The large right pleural effusion is stable. Tiny left-sided lung nodules seen on images 21, 25, and 32 of series 4 are stable. No new or progressing left-sided pulmonary nodule.  Changes of centrilobular and paraseptal emphysema noted in the lungs bilaterally.  Musculoskeletal: Bones are diffusely demineralized. On bloc resection of the upper right posterior lateral chest wall noted.  CT ABDOMEN PELVIS FINDINGS  Hepatobiliary: No focal abnormality within the liver parenchyma. Multiple small stones are seen in the gallbladder. No intrahepatic or extrahepatic biliary dilation.  Pancreas: No focal mass lesion. No dilatation of the main duct. No intraparenchymal cyst. No peripancreatic edema.  Spleen: No splenomegaly. No focal mass lesion.  Adrenals/Urinary Tract: No adrenal nodule or mass. No enhancing renal mass. No hydronephrosis. No evidence for hydroureter. The urinary bladder appears normal for the degree of distention.  Stomach/Bowel: Stomach is nondistended. No gastric wall thickening. No evidence of outlet obstruction. Duodenum is normally positioned as is the ligament of Treitz. No small bowel wall thickening. No small bowel dilatation. The terminal ileum is normal. The appendix is normal. No gross colonic mass. No colonic wall thickening. No substantial diverticular change.  Vascular/Lymphatic: There is abdominal aortic atherosclerosis without aneurysm. There is no gastrohepatic or  hepatoduodenal ligament lymphadenopathy. No intraperitoneal or retroperitoneal lymphadenopy. No pelvic sidewall lymphadenopathy.  Reproductive: Prostate gland is enlarged.  Other: No intraperitoneal free fluid.  Musculoskeletal: Bone windows reveal no worrisome lytic or sclerotic osseous lesions.  IMPRESSION: 1. Dominant suspicious lesion in the chest is the left superior mediastinal/thoracic inlet lymph node. This is stable in the interval. The right axillary and right pericardial phrenic lymph nodes described on the previous study are stable to decreased in the interval. No new or progressive findings in the chest on today's study. 2. Volume loss in the right hemi thorax with post treatment changes. Large right pleural effusion is stable. Scattered tiny bilateral pulmonary nodules are unchanged. No new or progressive pulmonary nodule evident. 3. Emphysema. 4. Abdominal aortic atherosclerosis.   Electronically Signed   By: Misty Stanley M.D.   On: 01/02/2015 11:37    ASSESSMENT AND PLAN: This is a very pleasant 74 years old white male with metastatic non-small cell lung cancer, squamous cell carcinoma. He is status post several chemotherapy regimens including treatment with immunotherapy with Nivolumab status post 20 cycles and tolerating his treatment fairly well but this was discontinued secondary to disease progression. He is currently undergoing systemic chemotherapy with single agent gemcitabine status post 3 cycles and is tolerating his treatment fairly well.  The recent CT scan of the chest, abdomen and pelvis showed stable disease. I discussed the scan results and showed the images to the patient today. He continues to have large right-sided pleural effusion and I gave the patient the option of proceeding with right-sided ultrasound-guided thoracentesis but he is feeling fine and declined to proceed with US thoracentesis at this time. I recommended for him to proceed with cycle #4 of his systemic  chemotherapy today as scheduled. The patient would come back for follow-up visit in 3 weeks for reevaluation and management of any adverse effect of his treatment. For pain management, he was given a refill of Percocet. He was advised to call immediately if he has any concerning symptoms in the interval. The patient voices understanding of current disease status and treatment options and is in agreement with the current care plan.  All questions were answered. The patient knows to call the clinic with any problems, questions or concerns. We can certainly see the patient much sooner if necessary.  Disclaimer: This note was dictated with voice recognition software. Similar sounding words can inadvertently be transcribed and may not be corrected upon review.

## 2015-01-06 NOTE — Patient Instructions (Signed)
[facility] Discharge Instructions for Patients Receiving Chemotherapy  Today you received the following chemotherapy agents: Gemcitabine   To help prevent nausea and vomiting after your treatment, we encourage you to take your nausea medication as prescribed.   If you develop nausea and vomiting that is not controlled by your nausea medication, call the clinic.   BELOW ARE SYMPTOMS THAT SHOULD BE REPORTED IMMEDIATELY:  *FEVER GREATER THAN 100.5 F  *CHILLS WITH OR WITHOUT FEVER  NAUSEA AND VOMITING THAT IS NOT CONTROLLED WITH YOUR NAUSEA MEDICATION  *UNUSUAL SHORTNESS OF BREATH  *UNUSUAL BRUISING OR BLEEDING  TENDERNESS IN MOUTH AND THROAT WITH OR WITHOUT PRESENCE OF ULCERS  *URINARY PROBLEMS  *BOWEL PROBLEMS  UNUSUAL RASH Items with * indicate a potential emergency and should be followed up as soon as possible.  Feel free to call the clinic you have any questions or concerns. The clinic phone number is [phone].  Please show the CHEMO ALERT CARD at check-in to the Emergency Department and triage nurse.    

## 2015-01-06 NOTE — Telephone Encounter (Signed)
Gave and printed appt sched and avs fo rpt for OCT and NOV

## 2015-01-06 NOTE — Progress Notes (Signed)
Pt saw Dr. Julien Nordmann today prior to chemo.  OK to treat from lab results today as per Dr. Julien Nordmann.

## 2015-01-08 ENCOUNTER — Encounter: Payer: Self-pay | Admitting: Internal Medicine

## 2015-01-12 ENCOUNTER — Encounter: Payer: Self-pay | Admitting: Medical Oncology

## 2015-01-12 ENCOUNTER — Other Ambulatory Visit: Payer: Self-pay | Admitting: Medical Oncology

## 2015-01-12 ENCOUNTER — Telehealth: Payer: Self-pay | Admitting: Internal Medicine

## 2015-01-12 ENCOUNTER — Telehealth: Payer: Self-pay | Admitting: Medical Oncology

## 2015-01-12 DIAGNOSIS — C3490 Malignant neoplasm of unspecified part of unspecified bronchus or lung: Secondary | ICD-10-CM

## 2015-01-12 NOTE — Telephone Encounter (Signed)
10/25 lab moved to [facility] per pof   anne

## 2015-01-12 NOTE — Telephone Encounter (Signed)
lab order faxed to [facility] for 10/25 and 11/1.

## 2015-01-13 ENCOUNTER — Ambulatory Visit: Payer: Medicare Other | Admitting: Nutrition

## 2015-01-13 ENCOUNTER — Ambulatory Visit (HOSPITAL_BASED_OUTPATIENT_CLINIC_OR_DEPARTMENT_OTHER): Payer: Medicare Other

## 2015-01-13 ENCOUNTER — Other Ambulatory Visit (HOSPITAL_BASED_OUTPATIENT_CLINIC_OR_DEPARTMENT_OTHER): Payer: Medicare Other

## 2015-01-13 VITALS — BP 128/66 | HR 82 | Temp 97.1°F | Resp 18

## 2015-01-13 DIAGNOSIS — C3411 Malignant neoplasm of upper lobe, right bronchus or lung: Secondary | ICD-10-CM | POA: Diagnosis present

## 2015-01-13 DIAGNOSIS — Z5111 Encounter for antineoplastic chemotherapy: Secondary | ICD-10-CM

## 2015-01-13 DIAGNOSIS — C3491 Malignant neoplasm of unspecified part of right bronchus or lung: Secondary | ICD-10-CM

## 2015-01-13 LAB — COMPREHENSIVE METABOLIC PANEL (CC13)
ALBUMIN: 3.1 g/dL — AB (ref 3.5–5.0)
ALK PHOS: 151 U/L — AB (ref 40–150)
ALT: 24 U/L (ref 0–55)
ANION GAP: 9 meq/L (ref 3–11)
AST: 16 U/L (ref 5–34)
BILIRUBIN TOTAL: 0.33 mg/dL (ref 0.20–1.20)
BUN: 13.1 mg/dL (ref 7.0–26.0)
CO2: 26 meq/L (ref 22–29)
CREATININE: 0.9 mg/dL (ref 0.7–1.3)
Calcium: 9.7 mg/dL (ref 8.4–10.4)
Chloride: 105 mEq/L (ref 98–109)
EGFR: 80 mL/min/{1.73_m2} — ABNORMAL LOW (ref >90)
Glucose: 111 mg/dl (ref 70–140)
Potassium: 4.6 mEq/L (ref 3.5–5.1)
Sodium: 140 mEq/L (ref 136–145)
TOTAL PROTEIN: 7.1 g/dL (ref 6.4–8.3)

## 2015-01-13 LAB — CBC WITH DIFFERENTIAL/PLATELET
BASO%: 0.3 % (ref 0.0–2.0)
Basophils Absolute: 0 10*3/uL (ref 0.0–0.1)
EOS ABS: 0 10*3/uL (ref 0.0–0.5)
EOS%: 0.9 % (ref 0.0–7.0)
HEMATOCRIT: 34.1 % — AB (ref 38.4–49.9)
HEMOGLOBIN: 11.1 g/dL — AB (ref 13.0–17.1)
LYMPH#: 0.5 10*3/uL — AB (ref 0.9–3.3)
LYMPH%: 16.3 % (ref 14.0–49.0)
MCH: 32.5 pg (ref 27.2–33.4)
MCHC: 32.6 g/dL (ref 32.0–36.0)
MCV: 99.7 fL — ABNORMAL HIGH (ref 79.3–98.0)
MONO#: 0.6 10*3/uL (ref 0.1–0.9)
MONO%: 18.8 % — ABNORMAL HIGH (ref 0.0–14.0)
NEUT%: 63.7 % (ref 39.0–75.0)
NEUTROS ABS: 2 10*3/uL (ref 1.5–6.5)
PLATELETS: 236 10*3/uL (ref 140–400)
RBC: 3.42 10*6/uL — AB (ref 4.20–5.82)
RDW: 17.3 % — AB (ref 11.0–14.6)
WBC: 3.2 10*3/uL — AB (ref 4.0–10.3)

## 2015-01-13 MED ORDER — SODIUM CHLORIDE 0.9 % IV SOLN
2100.0000 mg | Freq: Once | INTRAVENOUS | Status: AC
  Administered 2015-01-13: 2100 mg via INTRAVENOUS
  Filled 2015-01-13: qty 55.23

## 2015-01-13 MED ORDER — HEPARIN SOD (PORK) LOCK FLUSH 100 UNIT/ML IV SOLN
500.0000 [IU] | Freq: Once | INTRAVENOUS | Status: AC | PRN
  Administered 2015-01-13: 500 [IU]
  Filled 2015-01-13: qty 5

## 2015-01-13 MED ORDER — PROCHLORPERAZINE MALEATE 10 MG PO TABS
ORAL_TABLET | ORAL | Status: AC
  Filled 2015-01-13: qty 1

## 2015-01-13 MED ORDER — SODIUM CHLORIDE 0.9 % IJ SOLN
10.0000 mL | INTRAMUSCULAR | Status: DC | PRN
  Administered 2015-01-13: 10 mL
  Filled 2015-01-13: qty 10

## 2015-01-13 MED ORDER — SODIUM CHLORIDE 0.9 % IV SOLN
Freq: Once | INTRAVENOUS | Status: AC
  Administered 2015-01-13: 09:00:00 via INTRAVENOUS

## 2015-01-13 MED ORDER — PROCHLORPERAZINE MALEATE 10 MG PO TABS
10.0000 mg | ORAL_TABLET | Freq: Once | ORAL | Status: AC
  Administered 2015-01-13: 10 mg via ORAL

## 2015-01-13 NOTE — Patient Instructions (Signed)
[facility] Discharge Instructions for Patients Receiving Chemotherapy  Today you received the following chemotherapy agents Gemzar  To help prevent nausea and vomiting after your treatment, we encourage you to take your nausea medication Compazine 5 mg every 6 hours as needed.   If you develop nausea and vomiting that is not controlled by your nausea medication, call the clinic.   BELOW ARE SYMPTOMS THAT SHOULD BE REPORTED IMMEDIATELY:  *FEVER GREATER THAN 100.5 F  *CHILLS WITH OR WITHOUT FEVER  NAUSEA AND VOMITING THAT IS NOT CONTROLLED WITH YOUR NAUSEA MEDICATION  *UNUSUAL SHORTNESS OF BREATH  *UNUSUAL BRUISING OR BLEEDING  TENDERNESS IN MOUTH AND THROAT WITH OR WITHOUT PRESENCE OF ULCERS  *URINARY PROBLEMS  *BOWEL PROBLEMS  UNUSUAL RASH Items with * indicate a potential emergency and should be followed up as soon as possible.  Feel free to call the clinic you have any questions or concerns. The clinic phone number is [phone].  Please show the [facility] at check-in to the Emergency Department and triage nurse.

## 2015-01-13 NOTE — Progress Notes (Signed)
Nutrition follow up completed with patient in infusion room. Patient is being treated for lung cancer. Appetite remains minimal. States he tried to find CIB in [place] but could not find it. Will occasionally drink a [employer] however, states they are too sweet. Complains of fatigue. Weight declined and documented as 188 pounds on October 11.  Nutrition Diagnosis:  Food and Nutrition Knowledge Deficit continues.  Intervention:   Educated patient to continue to try to increase oral intake. Provided samples of [employer] and purchasing information. Educated patient on strategies for improving taste of Boost Breeze. Educated patient on ways to improve fatigue. Teach back method used.  Questions answered.  Monitoring, Evaluation, Goals:  Patient will increase calories and protein to minimize further weight loss and improve fatigue.  Next Visit: Tuesday, November 1, during infusion.

## 2015-01-14 ENCOUNTER — Other Ambulatory Visit (HOSPITAL_COMMUNITY): Payer: Self-pay

## 2015-01-14 DIAGNOSIS — C3490 Malignant neoplasm of unspecified part of unspecified bronchus or lung: Secondary | ICD-10-CM

## 2015-01-20 ENCOUNTER — Other Ambulatory Visit: Payer: Medicare Other

## 2015-01-20 ENCOUNTER — Encounter: Payer: Self-pay | Admitting: Nurse Practitioner

## 2015-01-20 ENCOUNTER — Other Ambulatory Visit (HOSPITAL_COMMUNITY): Payer: Medicare Other

## 2015-01-27 ENCOUNTER — Telehealth: Payer: Self-pay | Admitting: Physician Assistant

## 2015-01-27 ENCOUNTER — Ambulatory Visit: Payer: Medicare Other | Admitting: Nutrition

## 2015-01-27 ENCOUNTER — Ambulatory Visit (HOSPITAL_BASED_OUTPATIENT_CLINIC_OR_DEPARTMENT_OTHER): Payer: Medicare Other | Admitting: Physician Assistant

## 2015-01-27 ENCOUNTER — Encounter: Payer: Self-pay | Admitting: Physician Assistant

## 2015-01-27 ENCOUNTER — Other Ambulatory Visit (HOSPITAL_BASED_OUTPATIENT_CLINIC_OR_DEPARTMENT_OTHER): Payer: Medicare Other

## 2015-01-27 ENCOUNTER — Ambulatory Visit (HOSPITAL_BASED_OUTPATIENT_CLINIC_OR_DEPARTMENT_OTHER): Payer: Medicare Other

## 2015-01-27 ENCOUNTER — Other Ambulatory Visit: Payer: Medicare Other

## 2015-01-27 VITALS — BP 122/70 | HR 88 | Temp 98.4°F | Resp 18 | Ht 74.0 in | Wt 187.6 lb

## 2015-01-27 DIAGNOSIS — C3411 Malignant neoplasm of upper lobe, right bronchus or lung: Secondary | ICD-10-CM

## 2015-01-27 DIAGNOSIS — Z5111 Encounter for antineoplastic chemotherapy: Secondary | ICD-10-CM | POA: Diagnosis present

## 2015-01-27 DIAGNOSIS — J90 Pleural effusion, not elsewhere classified: Secondary | ICD-10-CM | POA: Diagnosis not present

## 2015-01-27 DIAGNOSIS — C3491 Malignant neoplasm of unspecified part of right bronchus or lung: Secondary | ICD-10-CM

## 2015-01-27 LAB — CBC WITH DIFFERENTIAL/PLATELET
BASO%: 0.6 % (ref 0.0–2.0)
BASOS ABS: 0 10*3/uL (ref 0.0–0.1)
EOS ABS: 0.1 10*3/uL (ref 0.0–0.5)
EOS%: 2.3 % (ref 0.0–7.0)
HEMATOCRIT: 33.5 % — AB (ref 38.4–49.9)
HEMOGLOBIN: 11.3 g/dL — AB (ref 13.0–17.1)
LYMPH#: 0.4 10*3/uL — AB (ref 0.9–3.3)
LYMPH%: 7.8 % — ABNORMAL LOW (ref 14.0–49.0)
MCH: 33.1 pg (ref 27.2–33.4)
MCHC: 33.6 g/dL (ref 32.0–36.0)
MCV: 98.4 fL — AB (ref 79.3–98.0)
MONO#: 0.7 10*3/uL (ref 0.1–0.9)
MONO%: 13.8 % (ref 0.0–14.0)
NEUT#: 3.6 10*3/uL (ref 1.5–6.5)
NEUT%: 75.5 % — AB (ref 39.0–75.0)
Platelets: 297 10*3/uL (ref 140–400)
RBC: 3.4 10*6/uL — ABNORMAL LOW (ref 4.20–5.82)
RDW: 19.4 % — ABNORMAL HIGH (ref 11.0–14.6)
WBC: 4.8 10*3/uL (ref 4.0–10.3)

## 2015-01-27 LAB — COMPREHENSIVE METABOLIC PANEL (CC13)
ALBUMIN: 3.1 g/dL — AB (ref 3.5–5.0)
ALK PHOS: 109 U/L (ref 40–150)
ALT: <9 U/L (ref 0–55)
AST: 12 U/L (ref 5–34)
Anion Gap: 10 mEq/L (ref 3–11)
BUN: 10.8 mg/dL (ref 7.0–26.0)
CALCIUM: 9.7 mg/dL (ref 8.4–10.4)
CHLORIDE: 105 meq/L (ref 98–109)
CO2: 26 mEq/L (ref 22–29)
Creatinine: 0.9 mg/dL (ref 0.7–1.3)
EGFR: 85 mL/min/{1.73_m2} — AB (ref >90)
Glucose: 105 mg/dl (ref 70–140)
POTASSIUM: 4.2 meq/L (ref 3.5–5.1)
Sodium: 140 mEq/L (ref 136–145)
Total Bilirubin: 0.34 mg/dL (ref 0.20–1.20)
Total Protein: 7 g/dL (ref 6.4–8.3)

## 2015-01-27 MED ORDER — HEPARIN SOD (PORK) LOCK FLUSH 100 UNIT/ML IV SOLN
500.0000 [IU] | Freq: Once | INTRAVENOUS | Status: AC | PRN
  Administered 2015-01-27: 500 [IU]
  Filled 2015-01-27: qty 5

## 2015-01-27 MED ORDER — TEMAZEPAM 15 MG PO CAPS: 15.0000 mg | ORAL_CAPSULE | Freq: Every evening | ORAL | Status: DC | PRN

## 2015-01-27 MED ORDER — PROCHLORPERAZINE MALEATE 10 MG PO TABS
ORAL_TABLET | ORAL | Status: AC
  Filled 2015-01-27: qty 1

## 2015-01-27 MED ORDER — SODIUM CHLORIDE 0.9 % IV SOLN
2100.0000 mg | Freq: Once | INTRAVENOUS | Status: AC
  Administered 2015-01-27: 2100 mg via INTRAVENOUS
  Filled 2015-01-27: qty 55.23

## 2015-01-27 MED ORDER — SODIUM CHLORIDE 0.9 % IV SOLN
Freq: Once | INTRAVENOUS | Status: AC
  Administered 2015-01-27: 10:00:00 via INTRAVENOUS

## 2015-01-27 MED ORDER — SODIUM CHLORIDE 0.9 % IJ SOLN
10.0000 mL | INTRAMUSCULAR | Status: DC | PRN
  Administered 2015-01-27: 10 mL
  Filled 2015-01-27: qty 10

## 2015-01-27 MED ORDER — PROCHLORPERAZINE MALEATE 10 MG PO TABS
10.0000 mg | ORAL_TABLET | Freq: Once | ORAL | Status: AC
  Administered 2015-01-27: 10 mg via ORAL

## 2015-01-27 NOTE — Patient Instructions (Signed)
[facility] Discharge Instructions for Patients Receiving Chemotherapy  Today you received the following chemotherapy agents Gemzar  To help prevent nausea and vomiting after your treatment, we encourage you to take your nausea medication Compazine 5 mg every 6 hours as needed.   If you develop nausea and vomiting that is not controlled by your nausea medication, call the clinic.   BELOW ARE SYMPTOMS THAT SHOULD BE REPORTED IMMEDIATELY:  *FEVER GREATER THAN 100.5 F  *CHILLS WITH OR WITHOUT FEVER  NAUSEA AND VOMITING THAT IS NOT CONTROLLED WITH YOUR NAUSEA MEDICATION  *UNUSUAL SHORTNESS OF BREATH  *UNUSUAL BRUISING OR BLEEDING  TENDERNESS IN MOUTH AND THROAT WITH OR WITHOUT PRESENCE OF ULCERS  *URINARY PROBLEMS  *BOWEL PROBLEMS  UNUSUAL RASH Items with * indicate a potential emergency and should be followed up as soon as possible.  Feel free to call the clinic you have any questions or concerns. The clinic phone number is [phone].  Please show the [facility] at check-in to the Emergency Department and triage nurse.

## 2015-01-27 NOTE — Progress Notes (Signed)
Nutrition follow-up completed with patient during infusion for lung cancer. Patient thinks his appetite is slightly improved. He continues to have fatigue. He is consuming chocolate [employer] breakfast essentials but states he does not like the strawberry flavor. Weight decreased just slightly to 187.6 pounds November 1 from 188 pounds on October 11. Patient states he is not drinking enough water.  Nutrition diagnosis:  Food and nutrition related knowledge deficit has improved.  Intervention:  Educated patient to continue [employer] essentials twice a day Educated patient on strategies for improving hydration, especially with water intake. Provided samples of [employer] breakfast essentials and coupons. Educated patient on strategies for improving fatigue. Teach back method was used.  Monitoring, evaluation, goals: Patient will continue to strive to increase calories and protein.  He will consume [employer] breakfast essentials twice a day and improve fluid intake.  Next visit: Tuesday, November 22, during infusion.  **Disclaimer: This note was dictated with voice recognition software. Similar sounding words can inadvertently be transcribed and this note may contain transcription errors which may not have been corrected upon publication of note.**

## 2015-01-27 NOTE — Telephone Encounter (Signed)
Already done

## 2015-01-27 NOTE — Progress Notes (Signed)
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  Primary site: Lung (Right)  Staging method: AJCC 7th Edition  Clinical: Stage IIIA (T3, N2, M0) signed by Curt Bears, MD on 05/09/2013 2:56 PM  Summary: Stage IIIA (T3, N2, M0)  PRIOR THERAPY:  1) Concurrent chemoradiation with weekly carboplatin for an AUC of 2 and paclitaxel 45 mg per meter squared. Status post 5 cycles. 2) Bronchoscopy, mediastinoscopy, right posterior lateral thoracotomy with right upper lobectomy, lymph node dissection,  and posterior approach to en bloc chest wall resection, ribs 2, 3, and 4, with positive bronchial resection margin. 3) palliative radiotherapy to the best of resection margin under the care of Dr. Tammi Klippel completed on 10/14/2013. 4) systemic chemotherapy with carboplatin for an AUC of 5 given on day 1 and Abraxane 100 mg/m given days 1, 8 and 15 every 3 weeks status post 3 cycles discontinued 12/31/2013 secondary to disease progression. 5) Immunotherapy with Nivolumab 3 mg/kg given every 2 weeks. Status post 20 cycles, discontinued secondary to disease progression.  CURRENT THERAPY: Systemic chemotherapy with single agent gemcitabine 1000 MG/M2 on days 1 and 8 every 3 weeks is status post 3 cycles.  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   INTERVAL HISTORY: DWAN Robles 74 y.o. male returns to the clinic today for follow-up visit. He is currently undergoing systemic chemotherapy with single agent gemcitabine 1000 MG/M2 on days 1 and 8 every 3 weeks. He is status post 4 cycles and tolerated the treatment well. He has no significant nausea or vomiting. He has no fever or chills. He denied having any significant diarrhea. He denied having any significant shortness of breath, cough or hemoptysis but has occasional right-sided chest pain. He has no significant weight loss or night sweats. He is here for D1 C5 chemotherapy.  MEDICAL HISTORY: Past Medical History  Diagnosis Date  . Hypercholesteremia 05/14/2011  . BPH (benign prostatic hyperplasia) 05/14/2011    takes Uroxatral daily  . GERD (gastroesophageal reflux disease)   . Shortness of breath     weather related  . Pneumonia 1965  . Headache(784.0)     occasionally  . Joint pain   . Maintenance chemotherapy   . Urinary frequency   . History of shingles   . Anemia   . Acquired thrombocytopenia   . 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 Cancer (HCC)     squamous cell carcinoma right superior sulcus  . Lung cancer ([facility])     right lung    ALLERGIES:  has No Known Allergies.  MEDICATIONS:  Current Outpatient  Prescriptions  Medication Sig Dispense Refill  . alfuzosin (UROXATRAL) 10 MG 24 hr tablet Take 10 mg by mouth daily with breakfast.    . clobetasol cream (TEMOVATE) 7.41 % Apply 1 application topically 2 (two) times daily.    . cycloSPORINE (RESTASIS) 0.05 % ophthalmic emulsion 1 drop 2 (two) times daily.    Marland Kitchen docusate sodium (COLACE) 100 MG capsule Take 100  mg by mouth daily.    . feeding supplement, RESOURCE BREEZE, (RESOURCE BREEZE) LIQD Take 1 Container by mouth daily at 3 pm.  0  . HYDROcodone-homatropine (HYCODAN) 5-1.5 MG/5ML syrup Take 5 mLs by mouth every 6 (six) hours as needed for cough. 240 mL 0  . Multiple Vitamin (MULITIVITAMIN WITH MINERALS) TABS Take 1 tablet by mouth daily.    Marland Kitchen oxyCODONE-acetaminophen (PERCOCET/ROXICET) 5-325 MG tablet Take 1 tablet by mouth every 6 (six) hours as needed for severe pain. 90 tablet 0  . polyethylene glycol (MIRALAX / GLYCOLAX) packet Take 17 g by mouth daily.    . prochlorperazine (COMPAZINE) 5 MG tablet Take 1 tablet (5 mg total) by mouth every 6 (six) hours as needed for nausea or vomiting. 30 tablet 2  . temazepam (RESTORIL) 15 MG capsule Take 1 capsule (15 mg total) by mouth at bedtime as needed for sleep. 30 capsule 0   No current facility-administered medications for this visit.    SURGICAL HISTORY:  Past Surgical History  Procedure Laterality Date  . Other surgical history      benign tumor removed from left leg several years ago  . Other surgical history      had melanoma/squamous cell removed previously per patient  . Tumor excision      Left knee  . Colonoscopy w/ biopsies and polypectomy    . Video bronchoscopy N/A 07/30/2013    Procedure: VIDEO BRONCHOSCOPY;  Surgeon: Grace Isaac, MD;  Location: [facility];  Service: Thoracic;  Laterality: N/A;  . Mediastinoscopy N/A 07/30/2013    Procedure: MEDIASTINOSCOPY;  Surgeon: Grace Isaac, MD;  Location: [facility];  Service: Thoracic;  Laterality: N/A;  . Video assisted thoracoscopy (vats)/wedge resection Right 07/30/2013    Procedure: VIDEO ASSISTED THORACOSCOPY;  Surgeon: Grace Isaac, MD;  Location: [facility];  Service: Thoracic;  Laterality: Right;  . Thoracotomy  07/30/2013    Procedure: THORACOTOMY FOR CHEST WALL RESECTION, RIGHT UPPER LOBECTOMY;  Surgeon: Grace Isaac, MD;  Location: [facility];  Service: Thoracic;;  . Adenoidectomy   as a child  . Tonsillectomy    . Portacath placement Left 10/30/2013    Procedure: INSERTION PORT-A-CATH WITH ULTRASOUND GUIDANCE AND FLUORO;  Surgeon: Grace Isaac, MD;  Location: [facility];  Service: Thoracic;  Laterality: Left;    REVIEW OF SYSTEMS:  Constitutional: positive for fatigue Eyes: negative Ears, nose, mouth, throat, and face: negative Respiratory: positive for pleurisy/chest pain Cardiovascular: negative Gastrointestinal: negative Genitourinary:negative Integument/breast: negative Hematologic/lymphatic: negative Musculoskeletal:negative Neurological: negative Behavioral/Psych: negative Endocrine: negative Allergic/Immunologic: negative   PHYSICAL EXAMINATION: General appearance: alert, cooperative, fatigued and no distress Head: Normocephalic, without obvious abnormality, atraumatic Neck: no adenopathy, no JVD, supple, symmetrical, trachea midline and thyroid not enlarged, symmetric, no tenderness/mass/nodules Lymph nodes: Cervical, supraclavicular, and axillary nodes normal. Resp: diminished breath sounds RLL Back: symmetric, no curvature. ROM normal. No CVA tenderness. Cardio: regular rate and rhythm, S1, S2 normal, no murmur, click, rub or gallop GI: soft, non-tender; bowel sounds normal; no masses,  no organomegaly Extremities: extremities normal, atraumatic, no cyanosis or edema Neurologic: Alert  and oriented X 3, normal strength and tone. Normal symmetric reflexes. Normal coordination and gait  ECOG PERFORMANCE STATUS: 1 - Symptomatic but completely ambulatory  There were no vitals taken for this visit.  LABORATORY DATA: CBC Latest Ref Rng 01/13/2015 01/06/2015 12/24/2014  WBC 4.0 - 10.3 10e3/uL 3.2(L) 4.9 2.6(L)  Hemoglobin 13.0 - 17.1 g/dL 11.1(L) 11.9(L) 10.9(L)  Hematocrit 38.4 - 49.9 % 34.1(L) 36.9(L) 33.0(L)  Platelets 140 - 400 10e3/uL 236 212 299    CMP Latest Ref Rng 01/13/2015 01/06/2015 12/24/2014  Glucose 70 - 140 mg/dl 111 115 106  BUN 7.0  - 26.0 mg/dL 13.1 13.9 13.2  Creatinine 0.7 - 1.3 mg/dL 0.9 1.0 1.0  Sodium 136 - 145 mEq/L 140 140 138  Potassium 3.5 - 5.1 mEq/L 4.6 4.3 4.3  Chloride 101 - 111 mmol/L - - -  CO2 22 - 29 mEq/L '26 26 29  '$ Calcium 8.4 - 10.4 mg/dL 9.7 9.8 9.6  Total Protein 6.4 - 8.3 g/dL 7.1 7.4 7.1  Total Bilirubin 0.20 - 1.20 mg/dL 0.33 0.46 0.35  Alkaline Phos 40 - 150 U/L 151(H) 277(H) 136  AST 5 - 34 U/L 16 50(H) 16  ALT 0 - 55 U/L 24 101(H) 18     RADIOGRAPHIC STUDIES: Ct Chest W Contrast  01/02/2015  CLINICAL DATA:  Subsequent encounter for right lung cancer diagnosed 04/25/2013 and status post right upper lobectomy. EXAM: CT CHEST, ABDOMEN, AND PELVIS WITH CONTRAST TECHNIQUE: Multidetector CT imaging of the chest, abdomen and pelvis was performed following the standard protocol during bolus administration of intravenous contrast. CONTRAST:  122m OMNIPAQUE IOHEXOL 300 MG/ML  SOLN COMPARISON:  10/24/2014. FINDINGS: CT CHEST FINDINGS Mediastinum/Lymph Nodes: The tip of the left-sided Port-A-Cath is positioned in the distal SVC, near the junction with the RA. There is no axillary lymphadenopathy. The small right axillary lymph nodes described previously have decreased in the interval. The left superior mediastinal/thoracic inlet lymph node measured previously at 3.1 x 2.5 cm now measures 3.1 x 2.6 cm. No evidence for paratracheal, precarinal, or subcarinal lymphadenopathy. The previously described cluster of lymph nodes in the right pericardial phrenic region has decreased in the interval. The 10 mm short axis lymph node seen previously is now 4 mm (image 50 series 2). No evidence for left hilar lymphadenopathy. Post treatment changes are seen in the right hilum. Lungs/Pleura: Trace amount of dependent mucus noted in the trachea. Prominent volume loss in the right hemi thorax is stable. 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 Tiny nodule along the cranial aspect of the aerated right lung (image 11  series 4) is stable. No new or progressive disease in the right hemi thorax. The large right pleural effusion is stable. Tiny left-sided lung nodules seen on images 21, 25, and 32 of series 4 are stable. No new or progressing left-sided pulmonary nodule. Changes of centrilobular and paraseptal emphysema noted in the lungs bilaterally. Musculoskeletal: Bones are diffusely demineralized. On bloc resection of the upper right posterior lateral chest wall noted. CT ABDOMEN PELVIS FINDINGS Hepatobiliary: No focal abnormality within the liver parenchyma. Multiple small stones are seen in the gallbladder. No intrahepatic or extrahepatic biliary dilation. Pancreas: No focal mass lesion. No dilatation of the main duct. No intraparenchymal cyst. No peripancreatic edema. Spleen: No splenomegaly. No focal mass lesion. Adrenals/Urinary Tract: No adrenal nodule or mass. No enhancing renal mass. No hydronephrosis. No evidence for hydroureter. The urinary bladder appears normal for the degree of distention. Stomach/Bowel: Stomach is nondistended. No gastric wall  thickening. No evidence of outlet obstruction. Duodenum is normally positioned as is the ligament of Treitz. No small bowel wall thickening. No small bowel dilatation. The terminal ileum is normal. The appendix is normal. No gross colonic mass. No colonic wall thickening. No substantial diverticular change. Vascular/Lymphatic: There is abdominal aortic atherosclerosis without aneurysm. There is no gastrohepatic or hepatoduodenal ligament lymphadenopathy. No intraperitoneal or retroperitoneal lymphadenopy. No pelvic sidewall lymphadenopathy. Reproductive: Prostate gland is enlarged. Other: No intraperitoneal free fluid. Musculoskeletal: Bone windows reveal no worrisome lytic or sclerotic osseous lesions. IMPRESSION: 1. Dominant suspicious lesion in the chest is the left superior mediastinal/thoracic inlet lymph node. This is stable in the interval. The right axillary and  right pericardial phrenic lymph nodes described on the previous study are stable to decreased in the interval. No new or progressive findings in the chest on today's study. 2. Volume loss in the right hemi thorax with post treatment changes. Large right pleural effusion is stable. Scattered tiny bilateral pulmonary nodules are unchanged. No new or progressive pulmonary nodule evident. 3. Emphysema. 4. Abdominal aortic atherosclerosis. Electronically Signed   By: Misty Stanley M.D.   On: 01/02/2015 11:37   Ct Abdomen Pelvis W Contrast  01/02/2015  CLINICAL DATA:  Subsequent encounter for right lung cancer diagnosed 04/25/2013 and status post right upper lobectomy. EXAM: CT CHEST, ABDOMEN, AND PELVIS WITH CONTRAST TECHNIQUE: Multidetector CT imaging of the chest, abdomen and pelvis was performed following the standard protocol during bolus administration of intravenous contrast. CONTRAST:  196m OMNIPAQUE IOHEXOL 300 MG/ML  SOLN COMPARISON:  10/24/2014. FINDINGS: CT CHEST FINDINGS Mediastinum/Lymph Nodes: The tip of the left-sided Port-A-Cath is positioned in the distal SVC, near the junction with the RA. There is no axillary lymphadenopathy. The small right axillary lymph nodes described previously have decreased in the interval. The left superior mediastinal/thoracic inlet lymph node measured previously at 3.1 x 2.5 cm now measures 3.1 x 2.6 cm. No evidence for paratracheal, precarinal, or subcarinal lymphadenopathy. The previously described cluster of lymph nodes in the right pericardial phrenic region has decreased in the interval. The 10 mm short axis lymph node seen previously is now 4 mm (image 50 series 2). No evidence for left hilar lymphadenopathy. Post treatment changes are seen in the right hilum. Lungs/Pleura: Trace amount of dependent mucus noted in the trachea. Prominent volume loss in the right hemi thorax is stable. 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 Tiny nodule along the cranial  aspect of the aerated right lung (image 11 series 4) is stable. No new or progressive disease in the right hemi thorax. The large right pleural effusion is stable. Tiny left-sided lung nodules seen on images 21, 25, and 32 of series 4 are stable. No new or progressing left-sided pulmonary nodule. Changes of centrilobular and paraseptal emphysema noted in the lungs bilaterally. Musculoskeletal: Bones are diffusely demineralized. On bloc resection of the upper right posterior lateral chest wall noted. CT ABDOMEN PELVIS FINDINGS Hepatobiliary: No focal abnormality within the liver parenchyma. Multiple small stones are seen in the gallbladder. No intrahepatic or extrahepatic biliary dilation. Pancreas: No focal mass lesion. No dilatation of the main duct. No intraparenchymal cyst. No peripancreatic edema. Spleen: No splenomegaly. No focal mass lesion. Adrenals/Urinary Tract: No adrenal nodule or mass. No enhancing renal mass. No hydronephrosis. No evidence for hydroureter. The urinary bladder appears normal for the degree of distention. Stomach/Bowel: Stomach is nondistended. No gastric wall thickening. No evidence of outlet obstruction. Duodenum is normally positioned as is the ligament of  Treitz. No small bowel wall thickening. No small bowel dilatation. The terminal ileum is normal. The appendix is normal. No gross colonic mass. No colonic wall thickening. No substantial diverticular change. Vascular/Lymphatic: There is abdominal aortic atherosclerosis without aneurysm. There is no gastrohepatic or hepatoduodenal ligament lymphadenopathy. No intraperitoneal or retroperitoneal lymphadenopy. No pelvic sidewall lymphadenopathy. Reproductive: Prostate gland is enlarged. Other: No intraperitoneal free fluid. Musculoskeletal: Bone windows reveal no worrisome lytic or sclerotic osseous lesions. IMPRESSION: 1. Dominant suspicious lesion in the chest is the left superior mediastinal/thoracic inlet lymph node. This is stable  in the interval. The right axillary and right pericardial phrenic lymph nodes described on the previous study are stable to decreased in the interval. No new or progressive findings in the chest on today's study. 2. Volume loss in the right hemi thorax with post treatment changes. Large right pleural effusion is stable. Scattered tiny bilateral pulmonary nodules are unchanged. No new or progressive pulmonary nodule evident. 3. Emphysema. 4. Abdominal aortic atherosclerosis. Electronically Signed   By: Misty Stanley M.D.   On: 01/02/2015 11:37    ASSESSMENT AND PLAN: This is a very pleasant 74 years old white male with metastatic non-small cell lung cancer, squamous cell carcinoma. He is status post several chemotherapy regimens including treatment with immunotherapy with Nivolumab status post 20 cycles and tolerating his treatment fairly well but this was discontinued secondary to disease progression. He is currently undergoing systemic chemotherapy with single agent gemcitabine status post 4 cycles and is tolerating his treatment fairly well.  The recent CT scan of the chest, abdomen and pelvis showed stable disease.  He continues to have large right-sided pleural effusion and ave the patient the option of proceeding with right-sided ultrasound-guided thoracentesis but he is feeling fine and declined to proceed with US thoracentesis at this time. I recommended for him to proceed with cycle #5 of his systemic chemotherapy today as scheduled. The patient would come back for follow-up visit in 3 weeks for reevaluation and management of any adverse effect of his treatment. For pain management, he was given a refill of Percocet. He was advised to call immediately if he has any concerning symptoms in the interval. The patient voices understanding of current disease status and treatment options and is in agreement with the current care plan.  All questions were answered. The patient knows to call the clinic  with any problems, questions or concerns. We can certainly see the patient much sooner if necessary.  ADDENDUM: Hematology/Oncology Attending: I had a face to face encounter with the patient. I recommended his care plan. This is a very pleasant 74 years old white male with metastatic non-small cell lung cancer, squamous cell carcinoma who is currently undergoing systemic chemotherapy with single agent gemcitabine status post 4 cycles. The patient has been tolerating his treatment fairly well with no significant adverse effects. He denied having any significant fever or chills. He has no nausea or vomiting. I recommended for the patient to proceed with cycle #5 today as a scheduled. He would come back for follow-up visit in 3 weeks for reevaluation before starting cycle #6. For pain management, the patient was given a refill of Percocet today. He was advised to call immediately if he has any concerning symptoms in the interval.  Disclaimer: This note was dictated with voice recognition software. Similar sounding words can inadvertently be transcribed and may be missed upon review. [name]., MD 01/27/2015

## 2015-01-27 NOTE — Telephone Encounter (Addendum)
FYI  Voicemail from [place] with [employer] [phone]) requesting DEA and provider name for order that was called in today at 10:30 am.  Call was forwarded to [phone].  Patient was seen by APP today.

## 2015-02-03 ENCOUNTER — Other Ambulatory Visit (HOSPITAL_BASED_OUTPATIENT_CLINIC_OR_DEPARTMENT_OTHER): Payer: Medicare Other

## 2015-02-03 ENCOUNTER — Ambulatory Visit (HOSPITAL_BASED_OUTPATIENT_CLINIC_OR_DEPARTMENT_OTHER): Payer: Medicare Other

## 2015-02-03 VITALS — BP 128/70 | HR 78 | Temp 97.5°F | Resp 17

## 2015-02-03 DIAGNOSIS — C3411 Malignant neoplasm of upper lobe, right bronchus or lung: Secondary | ICD-10-CM

## 2015-02-03 DIAGNOSIS — Z5111 Encounter for antineoplastic chemotherapy: Secondary | ICD-10-CM | POA: Diagnosis present

## 2015-02-03 DIAGNOSIS — Z79899 Other long term (current) drug therapy: Secondary | ICD-10-CM | POA: Diagnosis not present

## 2015-02-03 LAB — CBC WITH DIFFERENTIAL/PLATELET
BASO%: 0.9 % (ref 0.0–2.0)
Basophils Absolute: 0 10*3/uL (ref 0.0–0.1)
EOS%: 1.2 % (ref 0.0–7.0)
Eosinophils Absolute: 0 10*3/uL (ref 0.0–0.5)
HEMATOCRIT: 33.6 % — AB (ref 38.4–49.9)
HEMOGLOBIN: 11.2 g/dL — AB (ref 13.0–17.1)
LYMPH#: 0.5 10*3/uL — AB (ref 0.9–3.3)
LYMPH%: 14.5 % (ref 14.0–49.0)
MCH: 33.2 pg (ref 27.2–33.4)
MCHC: 33.3 g/dL (ref 32.0–36.0)
MCV: 99.6 fL — ABNORMAL HIGH (ref 79.3–98.0)
MONO#: 0.8 10*3/uL (ref 0.1–0.9)
MONO%: 24.4 % — ABNORMAL HIGH (ref 0.0–14.0)
NEUT%: 59 % (ref 39.0–75.0)
NEUTROS ABS: 1.9 10*3/uL (ref 1.5–6.5)
PLATELETS: 295 10*3/uL (ref 140–400)
RBC: 3.37 10*6/uL — ABNORMAL LOW (ref 4.20–5.82)
RDW: 18.2 % — ABNORMAL HIGH (ref 11.0–14.6)
WBC: 3.3 10*3/uL — ABNORMAL LOW (ref 4.0–10.3)

## 2015-02-03 LAB — COMPREHENSIVE METABOLIC PANEL (CC13)
ALBUMIN: 3.1 g/dL — AB (ref 3.5–5.0)
ALT: 14 U/L (ref 0–55)
AST: 16 U/L (ref 5–34)
Alkaline Phosphatase: 135 U/L (ref 40–150)
Anion Gap: 7 mEq/L (ref 3–11)
BILIRUBIN TOTAL: 0.36 mg/dL (ref 0.20–1.20)
BUN: 12.6 mg/dL (ref 7.0–26.0)
CALCIUM: 10.1 mg/dL (ref 8.4–10.4)
CO2: 28 mEq/L (ref 22–29)
CREATININE: 0.9 mg/dL (ref 0.7–1.3)
Chloride: 105 mEq/L (ref 98–109)
EGFR: 85 mL/min/{1.73_m2} — ABNORMAL LOW (ref >90)
Glucose: 114 mg/dl (ref 70–140)
Potassium: 4.2 mEq/L (ref 3.5–5.1)
Sodium: 141 mEq/L (ref 136–145)
TOTAL PROTEIN: 7.2 g/dL (ref 6.4–8.3)

## 2015-02-03 LAB — TSH CHCC: TSH: 1.525 m(IU)/L (ref 0.320–4.118)

## 2015-02-03 MED ORDER — SODIUM CHLORIDE 0.9 % IJ SOLN
3.0000 mL | INTRAMUSCULAR | Status: DC | PRN
  Filled 2015-02-03: qty 10

## 2015-02-03 MED ORDER — SODIUM CHLORIDE 0.9 % IV SOLN
Freq: Once | INTRAVENOUS | Status: AC
  Administered 2015-02-03: 09:00:00 via INTRAVENOUS

## 2015-02-03 MED ORDER — SODIUM CHLORIDE 0.9 % IJ SOLN
10.0000 mL | INTRAMUSCULAR | Status: DC | PRN
  Administered 2015-02-03: 10 mL
  Filled 2015-02-03: qty 10

## 2015-02-03 MED ORDER — PROCHLORPERAZINE MALEATE 10 MG PO TABS
ORAL_TABLET | ORAL | Status: AC
  Filled 2015-02-03: qty 1

## 2015-02-03 MED ORDER — ALTEPLASE 2 MG IJ SOLR
2.0000 mg | Freq: Once | INTRAMUSCULAR | Status: DC | PRN
  Filled 2015-02-03: qty 2

## 2015-02-03 MED ORDER — PROCHLORPERAZINE MALEATE 10 MG PO TABS
10.0000 mg | ORAL_TABLET | Freq: Once | ORAL | Status: AC
  Administered 2015-02-03: 10 mg via ORAL

## 2015-02-03 MED ORDER — GEMCITABINE HCL CHEMO INJECTION 1 GM/26.3ML
1000.0000 mg/m2 | Freq: Once | INTRAVENOUS | Status: AC
  Administered 2015-02-03: 2166 mg via INTRAVENOUS
  Filled 2015-02-03: qty 56.97

## 2015-02-03 MED ORDER — HEPARIN SOD (PORK) LOCK FLUSH 100 UNIT/ML IV SOLN
250.0000 [IU] | Freq: Once | INTRAVENOUS | Status: DC | PRN
  Filled 2015-02-03: qty 5

## 2015-02-03 MED ORDER — HEPARIN SOD (PORK) LOCK FLUSH 100 UNIT/ML IV SOLN
500.0000 [IU] | Freq: Once | INTRAVENOUS | Status: AC | PRN
  Administered 2015-02-03: 500 [IU]
  Filled 2015-02-03: qty 5

## 2015-02-10 ENCOUNTER — Other Ambulatory Visit: Payer: Medicare Other

## 2015-02-17 ENCOUNTER — Telehealth: Payer: Self-pay | Admitting: Internal Medicine

## 2015-02-17 ENCOUNTER — Encounter: Payer: Self-pay | Admitting: Internal Medicine

## 2015-02-17 ENCOUNTER — Other Ambulatory Visit (HOSPITAL_BASED_OUTPATIENT_CLINIC_OR_DEPARTMENT_OTHER): Payer: Medicare Other

## 2015-02-17 ENCOUNTER — Ambulatory Visit (HOSPITAL_BASED_OUTPATIENT_CLINIC_OR_DEPARTMENT_OTHER): Payer: Medicare Other | Admitting: Internal Medicine

## 2015-02-17 ENCOUNTER — Ambulatory Visit: Payer: Medicare Other | Admitting: Nutrition

## 2015-02-17 ENCOUNTER — Ambulatory Visit (HOSPITAL_BASED_OUTPATIENT_CLINIC_OR_DEPARTMENT_OTHER): Payer: Medicare Other

## 2015-02-17 VITALS — BP 96/50 | HR 80 | Temp 98.0°F | Resp 18 | Ht 74.0 in | Wt 185.4 lb

## 2015-02-17 DIAGNOSIS — C3411 Malignant neoplasm of upper lobe, right bronchus or lung: Secondary | ICD-10-CM

## 2015-02-17 DIAGNOSIS — R530 Neoplastic (malignant) related fatigue: Secondary | ICD-10-CM

## 2015-02-17 DIAGNOSIS — R079 Chest pain, unspecified: Secondary | ICD-10-CM | POA: Diagnosis not present

## 2015-02-17 DIAGNOSIS — Z5111 Encounter for antineoplastic chemotherapy: Secondary | ICD-10-CM | POA: Diagnosis present

## 2015-02-17 DIAGNOSIS — Z5112 Encounter for antineoplastic immunotherapy: Secondary | ICD-10-CM | POA: Diagnosis not present

## 2015-02-17 LAB — CBC WITH DIFFERENTIAL/PLATELET
BASO%: 0.5 % (ref 0.0–2.0)
Basophils Absolute: 0 10*3/uL (ref 0.0–0.1)
EOS%: 3.5 % (ref 0.0–7.0)
Eosinophils Absolute: 0.2 10*3/uL (ref 0.0–0.5)
HEMATOCRIT: 34 % — AB (ref 38.4–49.9)
HGB: 11.3 g/dL — ABNORMAL LOW (ref 13.0–17.1)
LYMPH#: 0.3 10*3/uL — AB (ref 0.9–3.3)
LYMPH%: 7.3 % — ABNORMAL LOW (ref 14.0–49.0)
MCH: 33.4 pg (ref 27.2–33.4)
MCHC: 33.2 g/dL (ref 32.0–36.0)
MCV: 100.7 fL — ABNORMAL HIGH (ref 79.3–98.0)
MONO#: 0.7 10*3/uL (ref 0.1–0.9)
MONO%: 16 % — AB (ref 0.0–14.0)
NEUT#: 3.2 10*3/uL (ref 1.5–6.5)
NEUT%: 72.7 % (ref 39.0–75.0)
Platelets: 268 10*3/uL (ref 140–400)
RBC: 3.37 10*6/uL — ABNORMAL LOW (ref 4.20–5.82)
RDW: 18.1 % — AB (ref 11.0–14.6)
WBC: 4.4 10*3/uL (ref 4.0–10.3)

## 2015-02-17 LAB — COMPREHENSIVE METABOLIC PANEL (CC13)
ALK PHOS: 101 U/L (ref 40–150)
ALT: <9 U/L (ref 0–55)
AST: 12 U/L (ref 5–34)
Albumin: 3 g/dL — ABNORMAL LOW (ref 3.5–5.0)
Anion Gap: 7 mEq/L (ref 3–11)
BILIRUBIN TOTAL: 0.52 mg/dL (ref 0.20–1.20)
BUN: 13.3 mg/dL (ref 7.0–26.0)
CALCIUM: 9.6 mg/dL (ref 8.4–10.4)
CO2: 28 meq/L (ref 22–29)
CREATININE: 0.8 mg/dL (ref 0.7–1.3)
Chloride: 105 mEq/L (ref 98–109)
EGFR: 86 mL/min/{1.73_m2} — ABNORMAL LOW (ref >90)
GLUCOSE: 108 mg/dL (ref 70–140)
Potassium: 4.5 mEq/L (ref 3.5–5.1)
Sodium: 140 mEq/L (ref 136–145)
Total Protein: 6.8 g/dL (ref 6.4–8.3)

## 2015-02-17 MED ORDER — SODIUM CHLORIDE 0.9 % IV SOLN
1000.0000 mg/m2 | Freq: Once | INTRAVENOUS | Status: AC
  Administered 2015-02-17: 2166 mg via INTRAVENOUS
  Filled 2015-02-17: qty 56.97

## 2015-02-17 MED ORDER — OXYCODONE-ACETAMINOPHEN 5-325 MG PO TABS: 1.0000 | ORAL_TABLET | Freq: Four times a day (QID) | ORAL | Status: DC | PRN

## 2015-02-17 MED ORDER — SODIUM CHLORIDE 0.9 % IJ SOLN
10.0000 mL | INTRAMUSCULAR | Status: DC | PRN
  Administered 2015-02-17: 10 mL
  Filled 2015-02-17: qty 10

## 2015-02-17 MED ORDER — SODIUM CHLORIDE 0.9 % IV SOLN
Freq: Once | INTRAVENOUS | Status: AC
Start: 2015-02-17 — End: 2015-02-17
  Administered 2015-02-17: 09:00:00 via INTRAVENOUS

## 2015-02-17 MED ORDER — HEPARIN SOD (PORK) LOCK FLUSH 100 UNIT/ML IV SOLN
500.0000 [IU] | Freq: Once | INTRAVENOUS | Status: AC | PRN
  Administered 2015-02-17: 500 [IU]
  Filled 2015-02-17: qty 5

## 2015-02-17 MED ORDER — TEMAZEPAM 15 MG PO CAPS: 15.0000 mg | ORAL_CAPSULE | Freq: Every evening | ORAL | Status: DC | PRN

## 2015-02-17 MED ORDER — PROCHLORPERAZINE MALEATE 10 MG PO TABS
10.0000 mg | ORAL_TABLET | Freq: Once | ORAL | Status: AC
  Administered 2015-02-17: 10 mg via ORAL

## 2015-02-17 MED ORDER — PROCHLORPERAZINE MALEATE 10 MG PO TABS
ORAL_TABLET | ORAL | Status: AC
Start: 2015-02-17 — End: 2015-02-17
  Filled 2015-02-17: qty 1

## 2015-02-17 NOTE — Patient Instructions (Signed)
[facility] Discharge Instructions for Patients Receiving Chemotherapy  Today you received the following chemotherapy agents Gemzar  To help prevent nausea and vomiting after your treatment, we encourage you to take your nausea medication as directed.    If you develop nausea and vomiting that is not controlled by your nausea medication, call the clinic.   BELOW ARE SYMPTOMS THAT SHOULD BE REPORTED IMMEDIATELY:  *FEVER GREATER THAN 100.5 F  *CHILLS WITH OR WITHOUT FEVER  NAUSEA AND VOMITING THAT IS NOT CONTROLLED WITH YOUR NAUSEA MEDICATION  *UNUSUAL SHORTNESS OF BREATH  *UNUSUAL BRUISING OR BLEEDING  TENDERNESS IN MOUTH AND THROAT WITH OR WITHOUT PRESENCE OF ULCERS  *URINARY PROBLEMS  *BOWEL PROBLEMS  UNUSUAL RASH Items with * indicate a potential emergency and should be followed up as soon as possible.  Feel free to call the clinic you have any questions or concerns. The clinic phone number is [phone].  Please show the CHEMO ALERT CARD at check-in to the Emergency Department and triage nurse.   

## 2015-02-17 NOTE — Telephone Encounter (Signed)
gave and printed appt sched and avs for pt for NOV and DEc

## 2015-02-17 NOTE — Progress Notes (Signed)
Nutrition follow up completed with patient receiving treatment for metastatic NSCLC. Noted patient had one episode of Diarrhea. Patient denies nutrition impact symptoms. He states he has no appetite but is continuing to drink [employer] essentials. Provided samples of chocolate Carnation breakfast essentials for patient. Weight decreased and documented as 185.4 pounds November 22, down from 187.6 pounds November 1.  Nutrition diagnosis:  Food and nutrition related knowledge deficit improved.  Intervention:  Patient educated to continue [employer] essentials at least twice daily. Encouraged increased fluid intake, especially with water. Teach back method used.  Monitoring, evaluation, goals: Patient will work to continue increase calories and protein to minimize weight loss.  Next visit: Tuesday, December 13 during infusion.  **Disclaimer: This note was dictated with voice recognition software. Similar sounding words can inadvertently be transcribed and this note may contain transcription errors which may not have been corrected upon publication of note.**

## 2015-02-17 NOTE — Progress Notes (Signed)
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  Primary site: Lung (Right)  Staging method: AJCC 7th Edition  Clinical: Stage IIIA (T3, N2, M0) signed by Curt Bears, MD on 05/09/2013 2:56 PM  Summary: Stage IIIA (T3, N2, M0)  PRIOR THERAPY:  1) Concurrent chemoradiation with weekly carboplatin for an AUC of 2 and paclitaxel 45 mg per meter squared. Status post 5 cycles. 2) Bronchoscopy, mediastinoscopy, right posterior lateral thoracotomy with right upper lobectomy, lymph node dissection,  and posterior approach to en bloc chest wall resection, ribs 2, 3, and 4, with positive bronchial resection margin. 3) palliative radiotherapy to the best of resection margin under the care of Dr. Tammi Klippel completed on 10/14/2013. 4) systemic chemotherapy with carboplatin for an AUC of 5 given on day 1 and Abraxane 100 mg/m given days 1, 8 and 15 every 3 weeks status post 3 cycles discontinued 12/31/2013 secondary to disease progression. 5) Immunotherapy with Nivolumab 3 mg/kg given every 2 weeks. Status post 20 cycles, discontinued secondary to disease progression.  CURRENT THERAPY: Systemic chemotherapy with single agent gemcitabine 1000 MG/M2 on days 1 and 8 every 3 weeks is status post 5 cycles.  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   INTERVAL HISTORY: CLEARNCE LEJA 74 y.o. male returns to the clinic today for follow-up visit. He is currently undergoing systemic chemotherapy with single agent gemcitabine 1000 MG/M2 on days 1 and 8 every 3 weeks. He is status post 5 cycle and tolerated the treatment well. He has no significant nausea or vomiting. He has no fever or chills. He had one episode of diarrhea earlier today. He denied having any significant shortness of breath, cough or hemoptysis but has occasional right-sided chest pain. He has no significant weight loss or night sweats.   MEDICAL HISTORY: Past Medical History  Diagnosis Date  . Hypercholesteremia 05/14/2011  . BPH (benign prostatic hyperplasia) 05/14/2011    takes Uroxatral daily  . GERD (gastroesophageal reflux disease)   . Shortness of breath     weather related  . Pneumonia 1965  . Headache(784.0)     occasionally  . Joint pain   . Maintenance chemotherapy   . Urinary frequency   . History of shingles   . Anemia   . Acquired thrombocytopenia   . 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 Cancer (HCC)     squamous cell carcinoma right superior sulcus  . Lung cancer ([facility])     right lung    ALLERGIES:  has No Known Allergies.  MEDICATIONS:  Current Outpatient Prescriptions  Medication Sig  Dispense Refill  . alfuzosin (UROXATRAL) 10 MG 24 hr tablet Take 10 mg by mouth daily with breakfast.    . clobetasol cream (TEMOVATE) 8.31 % Apply 1 application topically 2 (two) times daily.    . cycloSPORINE (RESTASIS) 0.05 % ophthalmic emulsion 1 drop 2 (two) times daily.    Marland Kitchen docusate sodium (COLACE) 100 MG capsule Take 100 mg by mouth daily.    . feeding  supplement, RESOURCE BREEZE, (RESOURCE BREEZE) LIQD Take 1 Container by mouth daily at 3 pm.  0  . HYDROcodone-homatropine (HYCODAN) 5-1.5 MG/5ML syrup Take 5 mLs by mouth every 6 (six) hours as needed for cough. 240 mL 0  . Multiple Vitamin (MULITIVITAMIN WITH MINERALS) TABS Take 1 tablet by mouth daily.    Marland Kitchen oxyCODONE-acetaminophen (PERCOCET/ROXICET) 5-325 MG tablet Take 1 tablet by mouth every 6 (six) hours as needed for severe pain. 90 tablet 0  . polyethylene glycol (MIRALAX / GLYCOLAX) packet Take 17 g by mouth daily.    . prochlorperazine (COMPAZINE) 5 MG tablet Take 1 tablet (5 mg total) by mouth every 6 (six) hours as needed for nausea or vomiting. 30 tablet 2  . temazepam (RESTORIL) 15 MG capsule Take 1 capsule (15 mg total) by mouth at bedtime as needed for sleep. 30 capsule 0  . temazepam (RESTORIL) 15 MG capsule Take 1 capsule (15 mg total) by mouth at bedtime as needed for sleep. 30 capsule 0   No current facility-administered medications for this visit.    SURGICAL HISTORY:  Past Surgical History  Procedure Laterality Date  . Other surgical history      benign tumor removed from left leg several years ago  . Other surgical history      had melanoma/squamous cell removed previously per patient  . Tumor excision      Left knee  . Colonoscopy w/ biopsies and polypectomy    . Video bronchoscopy N/A 07/30/2013    Procedure: VIDEO BRONCHOSCOPY;  Surgeon: Grace Isaac, MD;  Location: [facility];  Service: Thoracic;  Laterality: N/A;  . Mediastinoscopy N/A 07/30/2013    Procedure: MEDIASTINOSCOPY;  Surgeon: Grace Isaac, MD;  Location: [facility];  Service: Thoracic;  Laterality: N/A;  . Video assisted thoracoscopy (vats)/wedge resection Right 07/30/2013    Procedure: VIDEO ASSISTED THORACOSCOPY;  Surgeon: Grace Isaac, MD;  Location: [facility];  Service: Thoracic;  Laterality: Right;  . Thoracotomy  07/30/2013    Procedure: THORACOTOMY FOR CHEST WALL RESECTION, RIGHT UPPER LOBECTOMY;   Surgeon: Grace Isaac, MD;  Location: [facility];  Service: Thoracic;;  . Adenoidectomy  as a child  . Tonsillectomy    . Portacath placement Left 10/30/2013    Procedure: INSERTION PORT-A-CATH WITH ULTRASOUND GUIDANCE AND FLUORO;  Surgeon: Grace Isaac, MD;  Location: [facility];  Service: Thoracic;  Laterality: Left;    REVIEW OF SYSTEMS:  A comprehensive review of systems was negative except for: Constitutional: positive for fatigue Gastrointestinal: positive for diarrhea   PHYSICAL EXAMINATION: General appearance: alert, cooperative, fatigued and no distress Head: Normocephalic, without obvious abnormality, atraumatic Neck: no adenopathy, no JVD, supple, symmetrical, trachea midline and thyroid not enlarged, symmetric, no tenderness/mass/nodules Lymph nodes: Cervical, supraclavicular, and axillary nodes normal. Resp: diminished breath sounds RLL Back: symmetric, no curvature. ROM normal. No CVA tenderness. Cardio: regular rate and rhythm, S1, S2 normal, no murmur, click, rub or gallop GI: soft, non-tender; bowel sounds normal; no masses,  no organomegaly Extremities: extremities normal, atraumatic, no cyanosis or edema Neurologic:  Alert and oriented X 3, normal strength and tone. Normal symmetric reflexes. Normal coordination and gait  ECOG PERFORMANCE STATUS: 1 - Symptomatic but completely ambulatory  Blood pressure 96/50, pulse 80, temperature 98 F (36.7 C), temperature source Oral, resp. rate 18, height '6\' 2"'$  (1.88 m), weight 185 lb 6.4 oz (84.097 kg), SpO2 100 %.  LABORATORY DATA: Lab Results  Component Value Date   WBC 4.4 02/17/2015   HGB 11.3* 02/17/2015   HCT 34.0* 02/17/2015   MCV 100.7* 02/17/2015   PLT 268 02/17/2015      Chemistry      Component Value Date/Time   NA 141 02/03/2015 0833   NA 137 09/23/2014 1245   K 4.2 02/03/2015 0833   K 4.1 09/23/2014 1245   CL 102 09/23/2014 1245   CO2 28 02/03/2015 0833   CO2 27 09/23/2014 1245   BUN 12.6 02/03/2015  0833   BUN 17 09/23/2014 1245   CREATININE 0.9 02/03/2015 0833   CREATININE 0.94 09/23/2014 1245      Component Value Date/Time   CALCIUM 10.1 02/03/2015 0833   CALCIUM 8.7* 09/23/2014 1245   ALKPHOS 135 02/03/2015 0833   ALKPHOS 124 09/23/2014 1245   AST 16 02/03/2015 0833   AST 14* 09/23/2014 1245   ALT 14 02/03/2015 0833   ALT 13* 09/23/2014 1245   BILITOT 0.36 02/03/2015 0833   BILITOT 0.6 09/23/2014 1245       RADIOGRAPHIC STUDIES: No results found.  ASSESSMENT AND PLAN: This is a very pleasant 74 years old white male with metastatic non-small cell lung cancer, squamous cell carcinoma. He is status post several chemotherapy regimens including treatment with immunotherapy with Nivolumab status post 20 cycles and tolerating his treatment fairly well but this was discontinued secondary to disease progression. He is currently undergoing systemic chemotherapy with single agent gemcitabine status post 5 cycles and is tolerating his treatment fairly well.  I recommended for him to proceed with cycle #6 of his systemic chemotherapy today as scheduled. The patient would come back for follow-up visit in 3 weeks for reevaluation after repeating CT scan of the chest for restaging of his disease. For pain management, he was given a refill of Percocet. For insomnia, the patient was given a refill of Restoril. He was advised to call immediately if he has any concerning symptoms in the interval. The patient voices understanding of current disease status and treatment options and is in agreement with the current care plan.  All questions were answered. The patient knows to call the clinic with any problems, questions or concerns. We can certainly see the patient much sooner if necessary.  Disclaimer: This note was dictated with voice recognition software. Similar sounding words can inadvertently be transcribed and may not be corrected upon review.

## 2015-02-24 ENCOUNTER — Ambulatory Visit (HOSPITAL_BASED_OUTPATIENT_CLINIC_OR_DEPARTMENT_OTHER): Payer: Medicare Other

## 2015-02-24 ENCOUNTER — Other Ambulatory Visit (HOSPITAL_BASED_OUTPATIENT_CLINIC_OR_DEPARTMENT_OTHER): Payer: Medicare Other

## 2015-02-24 VITALS — BP 106/62 | HR 86 | Temp 98.1°F | Resp 18

## 2015-02-24 DIAGNOSIS — C3411 Malignant neoplasm of upper lobe, right bronchus or lung: Secondary | ICD-10-CM

## 2015-02-24 DIAGNOSIS — Z5111 Encounter for antineoplastic chemotherapy: Secondary | ICD-10-CM | POA: Diagnosis present

## 2015-02-24 LAB — COMPREHENSIVE METABOLIC PANEL (CC13)
ALT: 21 U/L (ref 0–55)
AST: 21 U/L (ref 5–34)
Albumin: 2.9 g/dL — ABNORMAL LOW (ref 3.5–5.0)
Alkaline Phosphatase: 135 U/L (ref 40–150)
Anion Gap: 10 mEq/L (ref 3–11)
BILIRUBIN TOTAL: 0.33 mg/dL (ref 0.20–1.20)
BUN: 12.3 mg/dL (ref 7.0–26.0)
CO2: 25 meq/L (ref 22–29)
Calcium: 9.7 mg/dL (ref 8.4–10.4)
Chloride: 104 mEq/L (ref 98–109)
Creatinine: 0.9 mg/dL (ref 0.7–1.3)
EGFR: 85 mL/min/{1.73_m2} — AB (ref >90)
GLUCOSE: 112 mg/dL (ref 70–140)
Potassium: 4.2 mEq/L (ref 3.5–5.1)
SODIUM: 139 meq/L (ref 136–145)
TOTAL PROTEIN: 7.1 g/dL (ref 6.4–8.3)

## 2015-02-24 LAB — CBC WITH DIFFERENTIAL/PLATELET
BASO%: 0.7 % (ref 0.0–2.0)
BASOS ABS: 0 10*3/uL (ref 0.0–0.1)
EOS%: 1.5 % (ref 0.0–7.0)
Eosinophils Absolute: 0 10*3/uL (ref 0.0–0.5)
HCT: 33.2 % — ABNORMAL LOW (ref 38.4–49.9)
HEMOGLOBIN: 10.9 g/dL — AB (ref 13.0–17.1)
LYMPH#: 0.3 10*3/uL — AB (ref 0.9–3.3)
LYMPH%: 11.2 % — ABNORMAL LOW (ref 14.0–49.0)
MCH: 33.2 pg (ref 27.2–33.4)
MCHC: 32.7 g/dL (ref 32.0–36.0)
MCV: 101.6 fL — ABNORMAL HIGH (ref 79.3–98.0)
MONO#: 0.6 10*3/uL (ref 0.1–0.9)
MONO%: 22.6 % — AB (ref 0.0–14.0)
NEUT%: 64 % (ref 39.0–75.0)
NEUTROS ABS: 1.7 10*3/uL (ref 1.5–6.5)
Platelets: 285 10*3/uL (ref 140–400)
RBC: 3.27 10*6/uL — ABNORMAL LOW (ref 4.20–5.82)
RDW: 16.9 % — AB (ref 11.0–14.6)
WBC: 2.7 10*3/uL — AB (ref 4.0–10.3)

## 2015-02-24 LAB — TECHNOLOGIST REVIEW

## 2015-02-24 MED ORDER — SODIUM CHLORIDE 0.9 % IV SOLN
Freq: Once | INTRAVENOUS | Status: AC
  Administered 2015-02-24: 09:00:00 via INTRAVENOUS

## 2015-02-24 MED ORDER — SODIUM CHLORIDE 0.9 % IV SOLN
Freq: Once | INTRAVENOUS | Status: AC
  Administered 2015-02-24: 09:00:00 via INTRAVENOUS
  Filled 2015-02-24: qty 4

## 2015-02-24 MED ORDER — SODIUM CHLORIDE 0.9 % IJ SOLN
10.0000 mL | INTRAMUSCULAR | Status: DC | PRN
  Administered 2015-02-24: 10 mL
  Filled 2015-02-24: qty 10

## 2015-02-24 MED ORDER — PROCHLORPERAZINE MALEATE 10 MG PO TABS: 10.0000 mg | ORAL_TABLET | Freq: Once | ORAL | Status: DC

## 2015-02-24 MED ORDER — HEPARIN SOD (PORK) LOCK FLUSH 100 UNIT/ML IV SOLN
500.0000 [IU] | Freq: Once | INTRAVENOUS | Status: AC | PRN
  Administered 2015-02-24: 500 [IU]
  Filled 2015-02-24: qty 5

## 2015-02-24 MED ORDER — SODIUM CHLORIDE 0.9 % IV SOLN
1000.0000 mg/m2 | Freq: Once | INTRAVENOUS | Status: AC
  Administered 2015-02-24: 2166 mg via INTRAVENOUS
  Filled 2015-02-24: qty 56.97

## 2015-02-24 NOTE — Patient Instructions (Signed)
[facility] Discharge Instructions for Patients Receiving Chemotherapy  Today you received the following chemotherapy agents Gemzar  To help prevent nausea and vomiting after your treatment, we encourage you to take your nausea medication as prescribed   If you develop nausea and vomiting that is not controlled by your nausea medication, call the clinic.   BELOW ARE SYMPTOMS THAT SHOULD BE REPORTED IMMEDIATELY:  *FEVER GREATER THAN 100.5 F  *CHILLS WITH OR WITHOUT FEVER  NAUSEA AND VOMITING THAT IS NOT CONTROLLED WITH YOUR NAUSEA MEDICATION  *UNUSUAL SHORTNESS OF BREATH  *UNUSUAL BRUISING OR BLEEDING  TENDERNESS IN MOUTH AND THROAT WITH OR WITHOUT PRESENCE OF ULCERS  *URINARY PROBLEMS  *BOWEL PROBLEMS  UNUSUAL RASH Items with * indicate a potential emergency and should be followed up as soon as possible.  Feel free to call the clinic you have any questions or concerns. The clinic phone number is [phone].  Please show the CHEMO ALERT CARD at check-in to the Emergency Department and triage nurse.   

## 2015-02-24 NOTE — Progress Notes (Signed)
Patient complains of nausea this AM. Patient denies episodes of emesis. However, patient states he took compazine 1.5 hours ago and has not experienced relief with nausea. Compazine in treatment plan. Dr. Julien Nordmann notified, hold compazine give zofran 8 mg IVPB. Patient verbalized understanding to above plan.

## 2015-03-03 ENCOUNTER — Ambulatory Visit: Payer: Medicare Other

## 2015-03-03 ENCOUNTER — Other Ambulatory Visit: Payer: Medicare Other

## 2015-03-05 ENCOUNTER — Ambulatory Visit (HOSPITAL_COMMUNITY)
Admission: RE | Admit: 2015-03-05 | Discharge: 2015-03-05 | Disposition: A | Discharge status: Home / Self Care | Payer: Medicare Other | Source: Ambulatory Visit | Attending: Internal Medicine | Admitting: Internal Medicine

## 2015-03-05 DIAGNOSIS — Z923 Personal history of irradiation: Secondary | ICD-10-CM | POA: Insufficient documentation

## 2015-03-05 DIAGNOSIS — J439 Emphysema, unspecified: Secondary | ICD-10-CM | POA: Insufficient documentation

## 2015-03-05 DIAGNOSIS — Z08 Encounter for follow-up examination after completed treatment for malignant neoplasm: Secondary | ICD-10-CM | POA: Insufficient documentation

## 2015-03-05 DIAGNOSIS — Z902 Acquired absence of lung [part of]: Secondary | ICD-10-CM | POA: Diagnosis not present

## 2015-03-05 DIAGNOSIS — R530 Neoplastic (malignant) related fatigue: Secondary | ICD-10-CM | POA: Diagnosis not present

## 2015-03-05 DIAGNOSIS — R079 Chest pain, unspecified: Secondary | ICD-10-CM | POA: Diagnosis not present

## 2015-03-05 DIAGNOSIS — C3411 Malignant neoplasm of upper lobe, right bronchus or lung: Secondary | ICD-10-CM | POA: Diagnosis not present

## 2015-03-05 DIAGNOSIS — Z5112 Encounter for antineoplastic immunotherapy: Secondary | ICD-10-CM

## 2015-03-05 MED ORDER — IOHEXOL 300 MG/ML  SOLN
75.0000 mL | Freq: Once | INTRAMUSCULAR | Status: AC | PRN
  Administered 2015-03-05: 75 mL via INTRAVENOUS

## 2015-03-10 ENCOUNTER — Telehealth: Payer: Self-pay | Admitting: Internal Medicine

## 2015-03-10 ENCOUNTER — Other Ambulatory Visit (HOSPITAL_BASED_OUTPATIENT_CLINIC_OR_DEPARTMENT_OTHER): Payer: Medicare Other

## 2015-03-10 ENCOUNTER — Telehealth: Payer: Self-pay | Admitting: *Deleted

## 2015-03-10 ENCOUNTER — Ambulatory Visit: Payer: Medicare Other | Admitting: Nutrition

## 2015-03-10 ENCOUNTER — Ambulatory Visit (HOSPITAL_BASED_OUTPATIENT_CLINIC_OR_DEPARTMENT_OTHER): Payer: Medicare Other | Admitting: Physician Assistant

## 2015-03-10 ENCOUNTER — Ambulatory Visit (HOSPITAL_BASED_OUTPATIENT_CLINIC_OR_DEPARTMENT_OTHER): Payer: Medicare Other

## 2015-03-10 VITALS — BP 105/57 | HR 86 | Temp 97.8°F | Resp 18 | Ht 74.0 in | Wt 185.6 lb

## 2015-03-10 DIAGNOSIS — C3411 Malignant neoplasm of upper lobe, right bronchus or lung: Secondary | ICD-10-CM

## 2015-03-10 DIAGNOSIS — C3412 Malignant neoplasm of upper lobe, left bronchus or lung: Secondary | ICD-10-CM

## 2015-03-10 DIAGNOSIS — Z5111 Encounter for antineoplastic chemotherapy: Secondary | ICD-10-CM

## 2015-03-10 DIAGNOSIS — R51 Headache: Secondary | ICD-10-CM

## 2015-03-10 LAB — CBC WITH DIFFERENTIAL/PLATELET
BASO%: 0.6 % (ref 0.0–2.0)
BASOS ABS: 0 10*3/uL (ref 0.0–0.1)
EOS%: 2.5 % (ref 0.0–7.0)
Eosinophils Absolute: 0.1 10*3/uL (ref 0.0–0.5)
HEMATOCRIT: 34.5 % — AB (ref 38.4–49.9)
HEMOGLOBIN: 11.3 g/dL — AB (ref 13.0–17.1)
LYMPH#: 0.3 10*3/uL — AB (ref 0.9–3.3)
LYMPH%: 6.1 % — ABNORMAL LOW (ref 14.0–49.0)
MCH: 33.6 pg — ABNORMAL HIGH (ref 27.2–33.4)
MCHC: 32.8 g/dL (ref 32.0–36.0)
MCV: 102.2 fL — ABNORMAL HIGH (ref 79.3–98.0)
MONO#: 0.8 10*3/uL (ref 0.1–0.9)
MONO%: 16.8 % — AB (ref 0.0–14.0)
NEUT%: 74 % (ref 39.0–75.0)
NEUTROS ABS: 3.5 10*3/uL (ref 1.5–6.5)
Platelets: 271 10*3/uL (ref 140–400)
RBC: 3.37 10*6/uL — ABNORMAL LOW (ref 4.20–5.82)
RDW: 16.8 % — AB (ref 11.0–14.6)
WBC: 4.8 10*3/uL (ref 4.0–10.3)

## 2015-03-10 LAB — COMPREHENSIVE METABOLIC PANEL
ALBUMIN: 3 g/dL — AB (ref 3.5–5.0)
ALK PHOS: 128 U/L (ref 40–150)
ALT: 10 U/L (ref 0–55)
AST: 11 U/L (ref 5–34)
Anion Gap: 10 mEq/L (ref 3–11)
BILIRUBIN TOTAL: 0.42 mg/dL (ref 0.20–1.20)
BUN: 13.3 mg/dL (ref 7.0–26.0)
CALCIUM: 9.4 mg/dL (ref 8.4–10.4)
CO2: 25 mEq/L (ref 22–29)
CREATININE: 0.9 mg/dL (ref 0.7–1.3)
Chloride: 104 mEq/L (ref 98–109)
EGFR: 83 mL/min/{1.73_m2} — ABNORMAL LOW (ref >90)
GLUCOSE: 124 mg/dL (ref 70–140)
POTASSIUM: 4 meq/L (ref 3.5–5.1)
SODIUM: 138 meq/L (ref 136–145)
TOTAL PROTEIN: 7.1 g/dL (ref 6.4–8.3)

## 2015-03-10 MED ORDER — SODIUM CHLORIDE 0.9 % IV SOLN
Freq: Once | INTRAVENOUS | Status: AC
  Administered 2015-03-10: 09:00:00 via INTRAVENOUS

## 2015-03-10 MED ORDER — PROCHLORPERAZINE MALEATE 10 MG PO TABS
ORAL_TABLET | ORAL | Status: AC
  Filled 2015-03-10: qty 1

## 2015-03-10 MED ORDER — HEPARIN SOD (PORK) LOCK FLUSH 100 UNIT/ML IV SOLN
500.0000 [IU] | Freq: Once | INTRAVENOUS | Status: AC | PRN
  Administered 2015-03-10: 500 [IU]
  Filled 2015-03-10: qty 5

## 2015-03-10 MED ORDER — PROCHLORPERAZINE MALEATE 10 MG PO TABS
10.0000 mg | ORAL_TABLET | Freq: Once | ORAL | Status: AC
  Administered 2015-03-10: 10 mg via ORAL

## 2015-03-10 MED ORDER — SODIUM CHLORIDE 0.9 % IV SOLN
1000.0000 mg/m2 | Freq: Once | INTRAVENOUS | Status: AC
  Administered 2015-03-10: 2166 mg via INTRAVENOUS
  Filled 2015-03-10: qty 56.97

## 2015-03-10 MED ORDER — SODIUM CHLORIDE 0.9 % IJ SOLN
10.0000 mL | INTRAMUSCULAR | Status: DC | PRN
  Administered 2015-03-10: 10 mL
  Filled 2015-03-10: qty 10

## 2015-03-10 NOTE — Progress Notes (Signed)
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  Primary site: Lung (Right)  Staging method: AJCC 7th Edition  Clinical: Stage IIIA (T3, N2, M0) signed by Curt Bears, MD on 05/09/2013 2:56 PM  Summary: Stage IIIA (T3, N2, M0)  PRIOR THERAPY:  1) Concurrent chemoradiation with weekly carboplatin for an AUC of 2 and paclitaxel 45 mg per meter squared. Status post 5 cycles. 2) Bronchoscopy, mediastinoscopy, right posterior lateral thoracotomy with right upper lobectomy, lymph node dissection,  and posterior approach to en bloc chest wall resection, ribs 2, 3, and 4, with positive bronchial resection margin. 3) palliative radiotherapy to the best of resection margin under the care of Dr. Tammi Klippel completed on 10/14/2013. 4) systemic chemotherapy with carboplatin for an AUC of 5 given on day 1 and Abraxane 100 mg/m given days 1, 8 and 15 every 3 weeks status post 3 cycles discontinued 12/31/2013 secondary to disease progression. 5) Immunotherapy with Nivolumab 3 mg/kg given every 2 weeks. Status post 20 cycles, discontinued secondary to disease progression.   CURRENT THERAPY:Systemic chemotherapy with single agent gemcitabine 1000 MG/M2 on days 1 and 8 every 3 weeks is status post 6 cycles . First cycle given on 11/04/14; latest cycle given on 02/17/15.  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   INTERVAL HISTORY: Terry Robles 74 y.o. male returns to the clinic today for follow-up visit. He is currently undergoing systemic chemotherapy with single agent gemcitabine 1000 MG/M2 on days 1 and 8 every 3 weeks. He is status post 6 cycle and tolerated the treatment well.  He had a recent CT of the chest which showed stable disease without any new findings. He reports hitting his head was fixed in a cabinet, which caused him to develop a "bump" which has not been increasing in size. He does report pain in the area, but no headaches. He has no significant nausea or vomiting, dizziness or vision changes associated with it. He denies any increased confusion or seizure activity. He has no fever or chills. He denies any diarrhea or constipation. He denied having any significant shortness of breath, cough or hemoptysis but has occasional right-sided chest pain. No bleeding issues noted. He has no significant weight loss or night sweats. He ambulates without difficulty  MEDICAL HISTORY: Past Medical History  Diagnosis Date  . Hypercholesteremia 05/14/2011  . BPH (benign prostatic hyperplasia) 05/14/2011    takes Uroxatral daily  . GERD (gastroesophageal reflux disease)   . Shortness of breath     weather related  . Pneumonia 1965  . Headache(784.0)     occasionally  . Joint pain   .  Maintenance chemotherapy   . Urinary frequency   . History of shingles   . Anemia   . Acquired thrombocytopenia   . 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 Cancer (HCC)     squamous cell carcinoma right superior sulcus  . Lung cancer ([facility])      right lung    ALLERGIES:  has No Known Allergies.  MEDICATIONS:  Current Outpatient Prescriptions  Medication Sig Dispense Refill  . alfuzosin (UROXATRAL) 10 MG 24 hr tablet Take 10 mg by mouth daily with breakfast.    . clobetasol cream (TEMOVATE) 0.10 % Apply 1 application topically 2 (two) times daily.    . cycloSPORINE (RESTASIS) 0.05 % ophthalmic emulsion 1 drop 2 (two) times daily.    Marland Kitchen docusate sodium (COLACE) 100 MG capsule Take 100 mg by mouth daily.    . feeding supplement, RESOURCE BREEZE, (RESOURCE BREEZE) LIQD Take 1 Container by mouth daily at 3 pm.  0  . HYDROcodone-homatropine (HYCODAN) 5-1.5 MG/5ML syrup Take 5 mLs by mouth every 6 (six) hours as needed for cough. 240 mL 0  . Multiple Vitamin (MULITIVITAMIN WITH MINERALS) TABS Take 1 tablet by mouth daily.    Marland Kitchen oxyCODONE-acetaminophen (PERCOCET/ROXICET) 5-325 MG tablet Take 1 tablet by mouth every 6 (six) hours as needed for severe pain. 90 tablet 0  . polyethylene glycol (MIRALAX / GLYCOLAX) packet Take 17 g by mouth daily.    . prochlorperazine (COMPAZINE) 5 MG tablet Take 1 tablet (5 mg total) by mouth every 6 (six) hours as needed for nausea or vomiting. 30 tablet 2  . temazepam (RESTORIL) 15 MG capsule Take 1 capsule (15 mg total) by mouth at bedtime as needed for sleep. 30 capsule 0  . temazepam (RESTORIL) 15 MG capsule Take 1 capsule (15 mg total) by mouth at bedtime as needed for sleep. 30 capsule 0   No current facility-administered medications for this visit.    SURGICAL HISTORY:  Past Surgical History  Procedure Laterality Date  . Other surgical history      benign tumor removed from left leg several years ago  . Other surgical history      had melanoma/squamous cell removed previously per patient  . Tumor excision      Left knee  . Colonoscopy w/ biopsies and polypectomy    . Video bronchoscopy N/A 07/30/2013    Procedure: VIDEO BRONCHOSCOPY;  Surgeon: Grace Isaac, MD;  Location: [facility];   Service: Thoracic;  Laterality: N/A;  . Mediastinoscopy N/A 07/30/2013    Procedure: MEDIASTINOSCOPY;  Surgeon: Grace Isaac, MD;  Location: [facility];  Service: Thoracic;  Laterality: N/A;  . Video assisted thoracoscopy (vats)/wedge resection Right 07/30/2013    Procedure: VIDEO ASSISTED THORACOSCOPY;  Surgeon: Grace Isaac, MD;  Location: [facility];  Service: Thoracic;  Laterality: Right;  . Thoracotomy  07/30/2013    Procedure: THORACOTOMY FOR CHEST WALL RESECTION, RIGHT UPPER LOBECTOMY;  Surgeon: Grace Isaac, MD;  Location: [facility];  Service: Thoracic;;  . Adenoidectomy  as a child  . Tonsillectomy    . Portacath placement Left 10/30/2013    Procedure: INSERTION PORT-A-CATH WITH ULTRASOUND GUIDANCE AND FLUORO;  Surgeon: Grace Isaac, MD;  Location: [facility];  Service: Thoracic;  Laterality: Left;    REVIEW OF SYSTEMS:  A comprehensive review of systems was negative except for: Constitutional: positive for fatigue Gastrointestinal: positive for diarrhea   PHYSICAL EXAMINATION: General appearance: alert, cooperative, fatigued and  no distress Head: Normocephalic, without obvious abnormality, atraumatic Neck: no adenopathy, no JVD, supple, symmetrical, trachea midline and thyroid not enlarged, symmetric, no tenderness/mass/nodules Lymph nodes: Cervical, supraclavicular, and axillary nodes normal. Resp: diminished breath sounds RLL Back: symmetric, no curvature. ROM normal. No CVA tenderness. Cardio: regular rate and rhythm, S1, S2 normal, no murmur, click, rub or gallop GI: soft, non-tender; bowel sounds normal; no masses,  no organomegaly Extremities: extremities normal, atraumatic, no cyanosis or edema Neurologic: Alert and oriented X 3, normal strength and tone. Normal symmetric reflexes. Normal coordination and gait  ECOG PERFORMANCE STATUS: 1 - Symptomatic but completely ambulatory  Blood pressure 105/57, pulse 86, temperature 97.8 F (36.6 C), temperature source Oral, resp. rate  18, height '6\' 2"'$  (1.88 m), weight 185 lb 9.6 oz (84.188 kg), SpO2 100 %.  LABORATORY DATA: CBC Latest Ref Rng 03/10/2015 02/24/2015 02/17/2015  WBC 4.0 - 10.3 10e3/uL 4.8 2.7(L) 4.4  Hemoglobin 13.0 - 17.1 g/dL 11.3(L) 10.9(L) 11.3(L)  Hematocrit 38.4 - 49.9 % 34.5(L) 33.2(L) 34.0(L)  Platelets 140 - 400 10e3/uL 271 285 268   CMP Latest Ref Rng 02/24/2015 02/17/2015 02/03/2015  Glucose 70 - 140 mg/dl 112 108 114  BUN 7.0 - 26.0 mg/dL 12.3 13.3 12.6  Creatinine 0.7 - 1.3 mg/dL 0.9 0.8 0.9  Sodium 136 - 145 mEq/L 139 140 141  Potassium 3.5 - 5.1 mEq/L 4.2 4.5 4.2  Chloride 101 - 111 mmol/L - - -  CO2 22 - 29 mEq/L '25 28 28  '$ Calcium 8.4 - 10.4 mg/dL 9.7 9.6 10.1  Total Protein 6.4 - 8.3 g/dL 7.1 6.8 7.2  Total Bilirubin 0.20 - 1.20 mg/dL 0.33 0.52 0.36  Alkaline Phos 40 - 150 U/L 135 101 135  AST 5 - 34 U/L '21 12 16  '$ ALT 0 - 55 U/L 21 <9 14     RADIOGRAPHIC STUDIES: Ct Chest W Contrast  03/05/2015  CLINICAL DATA:  Lung carcinoma EXAM: CT CHEST WITH CONTRAST TECHNIQUE: Multidetector CT imaging of the chest was performed during intravenous contrast administration. CONTRAST:  61m OMNIPAQUE IOHEXOL 300 MG/ML  SOLN COMPARISON:  January 02, 2015 FINDINGS: Mediastinum/Lymph Nodes: The previously noted mass/adenopathy in the left superior mediastinum/thoracic inlet region currently measures 3.3 x 2.5 cm, essentially unchanged from recent prior study allowing for slight differences in position. No new adenopathy is appreciable on this study. Port-A-Cath tip is in the superior vena cava without demonstrable pneumothorax. Thyroid appears normal. There is atherosclerotic change in the aorta without aneurysm or dissection. The visualized great vessels appear unremarkable. Note that the right common and left common carotid arteries arise as a common trunk. There is no major vessel pulmonary embolus appreciable. Pericardium is minimally thickened, stable. There are scattered foci of coronary artery  calcification. Post radiation therapy change in the right hilum is stable. Lungs/Pleura: There is a sizable right pleural effusion with both free and loculated components, stable. There is a subpulmonic component to this effusion. Volume loss throughout much of the right lung remains with a combination cicatrization and consolidation. A well-defined mass is not appreciable in the right hemithorax. On the left, there is again noted a 6 mm nodular opacity in the anterior segment of the left upper lobe seen on axial slice 28 series 3. A 3 mm nodular opacity in the superior segment of the left lower lobe is seen on axial slice 32 series 3. A 2 mm nodular opacity in the right apex seen on axial slice 13 series 3 is stable. No new  parenchymal nodular opacities are noted. There is no consolidation or effusion on the left. There is a degree of underlying emphysematous change which is stable. Upper abdomen: Visualized upper abdominal structures appear unremarkable. Musculoskeletal: There is degenerative change in the thoracic spine. There are no blastic or lytic bone lesions. There is evidence of old rib trauma on the left. IMPRESSION: Mass/enlarged lymph node at the left thoracic inlet/superior mediastinum is essentially stable. Small parenchymal lung nodules, largest measuring 6 mm on the left, are unchanged. There is a degree of underlying emphysema. There is extensive postoperative/ post radiation therapy change on the right with volume loss and consolidation as well as large effusion. These are stable changes. No new lymph node prominence identified. Slight pericardial thickening is stable. Overall no change from recent prior study. Electronically Signed   By: Lowella Grip III M.D.   On: 03/05/2015 08:30    ASSESSMENT AND PLAN: This is a very pleasant 74 years old white male with metastatic non-small cell lung cancer, squamous cell carcinoma. He is status post several chemotherapy regimens including treatment  with immunotherapy with Nivolumab status post 20 cycles and tolerating his treatment fairly well but this was discontinued secondary to disease progression. He is currently undergoing systemic chemotherapy with single agent gemcitabine status post 6 cycles and is tolerating his treatment fairly well.  Latest CT scan of the chest on 03/05/15 shows stable disease Proceed with cycle #7 today. He is to return to the office prior to his cycle #8 in 3 weeks with labs. He was offered to have a CT of the head performed, But the patient declined at this time. He was instructed that if this area increases or causes symptoms such as headaches, vision changes, confusion or seizures, to return to the emergency department for further evaluation and imaging studies.  He was advised to call immediately if he has any concerning symptoms in the interval. The patient voices understanding of current disease status and treatment options and is in agreement with the current care plan.  All questions were answered. The patient knows to call the clinic with any problems, questions or concerns. We can certainly see the patient much sooner if necessary.   ADDENDUM: Hematology/Oncology Attending: I had a face to face encounter with the patient today. I recommended his care plan. This is a very pleasant 74 years old white male with metastatic non-small cell lung cancer, squamous cell carcinoma who is currently undergoing systemic chemotherapy with single agent gemcitabine status post 6 cycles. The patient has been tolerating his treatment fairly well with no specific complaints except for mild fatigue. The recent CT scan of the chest showed no evidence for disease progression. I discussed the scan results with the patient today. I recommended for him to continue his current treatment with single agent gemcitabine as a scheduled. He will start cycle #7 today. The patient will come back for follow-up visit in 3 weeks for  reevaluation before starting cycle #8. He had a recent trauma to the head with scapular hematoma. He is currently asymptomatic except for mild pain in the area. I offered the patient CT scan of the head but he declined at this point. I strongly recommended for him to go to the emergency department if he has any concerning symptoms including headache or visual changes or mental status change. The patient was advised to call immediately if he has any other concerning symptoms in the interval.  Disclaimer: This note was dictated with voice recognition software. Similar  sounding words can inadvertently be transcribed and may be missed upon review. [name]., MD 03/10/2015

## 2015-03-10 NOTE — Progress Notes (Signed)
Nutrition follow-up completed with patient during treatment for metastatic non-small cell cancer. Patient denies nausea and vomiting.  Reports one episode of diarrhea. Patient reports appetite remains poor but he continues to drink [employer] breakfast essentials. Weight is stable and documented as 185.6 pounds December 13 from 185.4 pounds November 22.  Nutrition diagnosis: Food and nutrition related knowledge deficit improved.  Intervention: Patient educated to continue [employer] essentials twice a day Stressed the importance of continued increased oral intake to minimize weight loss. Encouraged increased fluid intake. Teach back method used.  Monitoring, evaluation, goals: Patient will continue to work to increase calories and protein for weight stabilization.  Next visit: Tuesday, December 20, during infusion.  **Disclaimer: This note was dictated with voice recognition software. Similar sounding words can inadvertently be transcribed and this note may contain transcription errors which may not have been corrected upon publication of note.**

## 2015-03-10 NOTE — Telephone Encounter (Signed)
per pof to sch pt appt-sent MW email to sch pt trmt-pt to get updated copy b4 leaving

## 2015-03-10 NOTE — Telephone Encounter (Signed)
Per staff message and POF I have scheduled appts. Advised scheduler of appts. JMW  

## 2015-03-10 NOTE — Patient Instructions (Signed)
[facility] Discharge Instructions for Patients Receiving Chemotherapy  Today you received the following chemotherapy agents Gemzar  To help prevent nausea and vomiting after your treatment, we encourage you to take your nausea medication as prescribed   If you develop nausea and vomiting that is not controlled by your nausea medication, call the clinic.   BELOW ARE SYMPTOMS THAT SHOULD BE REPORTED IMMEDIATELY:  *FEVER GREATER THAN 100.5 F  *CHILLS WITH OR WITHOUT FEVER  NAUSEA AND VOMITING THAT IS NOT CONTROLLED WITH YOUR NAUSEA MEDICATION  *UNUSUAL SHORTNESS OF BREATH  *UNUSUAL BRUISING OR BLEEDING  TENDERNESS IN MOUTH AND THROAT WITH OR WITHOUT PRESENCE OF ULCERS  *URINARY PROBLEMS  *BOWEL PROBLEMS  UNUSUAL RASH Items with * indicate a potential emergency and should be followed up as soon as possible.  Feel free to call the clinic you have any questions or concerns. The clinic phone number is [phone].  Please show the CHEMO ALERT CARD at check-in to the Emergency Department and triage nurse.   

## 2015-03-16 ENCOUNTER — Encounter: Payer: Self-pay | Admitting: Internal Medicine

## 2015-03-16 ENCOUNTER — Other Ambulatory Visit: Payer: Self-pay | Admitting: Internal Medicine

## 2015-03-16 DIAGNOSIS — C3412 Malignant neoplasm of upper lobe, left bronchus or lung: Secondary | ICD-10-CM

## 2015-03-17 ENCOUNTER — Ambulatory Visit: Payer: Medicare Other | Admitting: Nutrition

## 2015-03-17 ENCOUNTER — Ambulatory Visit (HOSPITAL_BASED_OUTPATIENT_CLINIC_OR_DEPARTMENT_OTHER): Payer: Medicare Other

## 2015-03-17 ENCOUNTER — Other Ambulatory Visit (HOSPITAL_BASED_OUTPATIENT_CLINIC_OR_DEPARTMENT_OTHER): Payer: Medicare Other

## 2015-03-17 ENCOUNTER — Telehealth: Payer: Self-pay | Admitting: Internal Medicine

## 2015-03-17 VITALS — BP 150/72 | HR 78 | Temp 97.9°F | Resp 18

## 2015-03-17 DIAGNOSIS — Z5111 Encounter for antineoplastic chemotherapy: Secondary | ICD-10-CM | POA: Diagnosis not present

## 2015-03-17 DIAGNOSIS — C3411 Malignant neoplasm of upper lobe, right bronchus or lung: Secondary | ICD-10-CM | POA: Diagnosis present

## 2015-03-17 LAB — CBC WITH DIFFERENTIAL/PLATELET
BASO%: 0.6 % (ref 0.0–2.0)
BASOS ABS: 0 10*3/uL (ref 0.0–0.1)
EOS%: 0.7 % (ref 0.0–7.0)
Eosinophils Absolute: 0 10*3/uL (ref 0.0–0.5)
HEMATOCRIT: 35 % — AB (ref 38.4–49.9)
HEMOGLOBIN: 11.4 g/dL — AB (ref 13.0–17.1)
LYMPH#: 0.3 10*3/uL — AB (ref 0.9–3.3)
LYMPH%: 8.9 % — ABNORMAL LOW (ref 14.0–49.0)
MCH: 33.3 pg (ref 27.2–33.4)
MCHC: 32.7 g/dL (ref 32.0–36.0)
MCV: 101.7 fL — ABNORMAL HIGH (ref 79.3–98.0)
MONO#: 1 10*3/uL — ABNORMAL HIGH (ref 0.1–0.9)
MONO%: 32 % — AB (ref 0.0–14.0)
NEUT#: 1.8 10*3/uL (ref 1.5–6.5)
NEUT%: 57.8 % (ref 39.0–75.0)
Platelets: 275 10*3/uL (ref 140–400)
RBC: 3.44 10*6/uL — ABNORMAL LOW (ref 4.20–5.82)
RDW: 16.2 % — AB (ref 11.0–14.6)
WBC: 3.2 10*3/uL — AB (ref 4.0–10.3)

## 2015-03-17 LAB — COMPREHENSIVE METABOLIC PANEL
ALBUMIN: 3 g/dL — AB (ref 3.5–5.0)
ALK PHOS: 215 U/L — AB (ref 40–150)
ALT: 27 U/L (ref 0–55)
AST: 20 U/L (ref 5–34)
Anion Gap: 11 mEq/L (ref 3–11)
BUN: 11.1 mg/dL (ref 7.0–26.0)
CALCIUM: 10.3 mg/dL (ref 8.4–10.4)
CHLORIDE: 101 meq/L (ref 98–109)
CO2: 26 mEq/L (ref 22–29)
CREATININE: 0.8 mg/dL (ref 0.7–1.3)
EGFR: 86 mL/min/{1.73_m2} — ABNORMAL LOW (ref >90)
GLUCOSE: 110 mg/dL (ref 70–140)
POTASSIUM: 4.1 meq/L (ref 3.5–5.1)
SODIUM: 138 meq/L (ref 136–145)
Total Bilirubin: 0.41 mg/dL (ref 0.20–1.20)
Total Protein: 7.5 g/dL (ref 6.4–8.3)

## 2015-03-17 MED ORDER — PROCHLORPERAZINE MALEATE 10 MG PO TABS
ORAL_TABLET | ORAL | Status: AC
  Filled 2015-03-17: qty 1

## 2015-03-17 MED ORDER — HEPARIN SOD (PORK) LOCK FLUSH 100 UNIT/ML IV SOLN
500.0000 [IU] | Freq: Once | INTRAVENOUS | Status: AC | PRN
  Administered 2015-03-17: 500 [IU]
  Filled 2015-03-17: qty 5

## 2015-03-17 MED ORDER — SODIUM CHLORIDE 0.9 % IJ SOLN
10.0000 mL | INTRAMUSCULAR | Status: DC | PRN
  Administered 2015-03-17: 10 mL
  Filled 2015-03-17: qty 10

## 2015-03-17 MED ORDER — SODIUM CHLORIDE 0.9 % IV SOLN
1000.0000 mg/m2 | Freq: Once | INTRAVENOUS | Status: AC
  Administered 2015-03-17: 2166 mg via INTRAVENOUS
  Filled 2015-03-17: qty 56.97

## 2015-03-17 MED ORDER — PROCHLORPERAZINE MALEATE 10 MG PO TABS
10.0000 mg | ORAL_TABLET | Freq: Once | ORAL | Status: AC
  Administered 2015-03-17: 10 mg via ORAL

## 2015-03-17 MED ORDER — SODIUM CHLORIDE 0.9 % IV SOLN
Freq: Once | INTRAVENOUS | Status: AC
  Administered 2015-03-17: 09:00:00 via INTRAVENOUS

## 2015-03-17 NOTE — Patient Instructions (Signed)
[facility] Discharge Instructions for Patients Receiving Chemotherapy  Today you received the following chemotherapy agents Gemzar  To help prevent nausea and vomiting after your treatment, we encourage you to take your nausea medication as directed.    If you develop nausea and vomiting that is not controlled by your nausea medication, call the clinic.   BELOW ARE SYMPTOMS THAT SHOULD BE REPORTED IMMEDIATELY:  *FEVER GREATER THAN 100.5 F  *CHILLS WITH OR WITHOUT FEVER  NAUSEA AND VOMITING THAT IS NOT CONTROLLED WITH YOUR NAUSEA MEDICATION  *UNUSUAL SHORTNESS OF BREATH  *UNUSUAL BRUISING OR BLEEDING  TENDERNESS IN MOUTH AND THROAT WITH OR WITHOUT PRESENCE OF ULCERS  *URINARY PROBLEMS  *BOWEL PROBLEMS  UNUSUAL RASH Items with * indicate a potential emergency and should be followed up as soon as possible.  Feel free to call the clinic you have any questions or concerns. The clinic phone number is [phone].  Please show the CHEMO ALERT CARD at check-in to the Emergency Department and triage nurse.   

## 2015-03-17 NOTE — Telephone Encounter (Signed)
Ct scheduled and patient will get a new avs in chemo

## 2015-03-17 NOTE — Progress Notes (Signed)
Nutrition follow-up completed with patient during chemotherapy for metastatic non-small cell lung cancer. Patient reports mild nausea. Patient states he continues to drink Carn[employer] breakfast essentials. Last weight documented as 185.6 pounds December 13.  No recent weight has been documented.  Nutrition diagnosis: Food and nutrition related knowledge deficit has improved.  Intervention: Recommended patient continue [employer] breakfast essentials at least twice a day. Provided additional samples and coupons. Stressed importance of adequate calories and protein as well as fluids. Teach back method used.  Monitoring, evaluation, goals: Patient will tolerate adequate calories and protein to promote weight maintenance.  Next visit: Tuesday, January 10, during infusion.  **Disclaimer: This note was dictated with voice recognition software. Similar sounding words can inadvertently be transcribed and this note may contain transcription errors which may not have been corrected upon publication of note.**

## 2015-03-19 ENCOUNTER — Ambulatory Visit (HOSPITAL_COMMUNITY)
Admission: RE | Admit: 2015-03-19 | Discharge: 2015-03-19 | Disposition: A | Discharge status: Home / Self Care | Payer: Medicare Other | Source: Ambulatory Visit | Attending: Internal Medicine | Admitting: Internal Medicine

## 2015-03-19 DIAGNOSIS — C3412 Malignant neoplasm of upper lobe, left bronchus or lung: Secondary | ICD-10-CM

## 2015-03-19 DIAGNOSIS — C3490 Malignant neoplasm of unspecified part of unspecified bronchus or lung: Secondary | ICD-10-CM | POA: Diagnosis not present

## 2015-03-19 DIAGNOSIS — R220 Localized swelling, mass and lump, head: Secondary | ICD-10-CM | POA: Diagnosis not present

## 2015-03-19 DIAGNOSIS — M899 Disorder of bone, unspecified: Secondary | ICD-10-CM | POA: Diagnosis not present

## 2015-03-19 DIAGNOSIS — R110 Nausea: Secondary | ICD-10-CM | POA: Diagnosis not present

## 2015-03-26 ENCOUNTER — Telehealth: Payer: Self-pay

## 2015-03-26 ENCOUNTER — Telehealth: Payer: Self-pay | Admitting: *Deleted

## 2015-03-26 ENCOUNTER — Ambulatory Visit (HOSPITAL_BASED_OUTPATIENT_CLINIC_OR_DEPARTMENT_OTHER): Payer: Medicare Other | Admitting: Nurse Practitioner

## 2015-03-26 ENCOUNTER — Ambulatory Visit (HOSPITAL_BASED_OUTPATIENT_CLINIC_OR_DEPARTMENT_OTHER): Payer: Medicare Other

## 2015-03-26 VITALS — BP 116/71 | HR 85 | Temp 98.6°F | Resp 18 | Ht 74.0 in | Wt 175.7 lb

## 2015-03-26 DIAGNOSIS — R630 Anorexia: Secondary | ICD-10-CM

## 2015-03-26 DIAGNOSIS — R093 Abnormal sputum: Secondary | ICD-10-CM | POA: Diagnosis not present

## 2015-03-26 DIAGNOSIS — M899 Disorder of bone, unspecified: Secondary | ICD-10-CM | POA: Diagnosis not present

## 2015-03-26 DIAGNOSIS — C3412 Malignant neoplasm of upper lobe, left bronchus or lung: Secondary | ICD-10-CM

## 2015-03-26 DIAGNOSIS — R51 Headache: Secondary | ICD-10-CM

## 2015-03-26 DIAGNOSIS — R112 Nausea with vomiting, unspecified: Secondary | ICD-10-CM | POA: Diagnosis not present

## 2015-03-26 DIAGNOSIS — E8809 Other disorders of plasma-protein metabolism, not elsewhere classified: Secondary | ICD-10-CM

## 2015-03-26 DIAGNOSIS — R5383 Other fatigue: Secondary | ICD-10-CM | POA: Diagnosis not present

## 2015-03-26 DIAGNOSIS — R05 Cough: Secondary | ICD-10-CM

## 2015-03-26 DIAGNOSIS — R079 Chest pain, unspecified: Secondary | ICD-10-CM

## 2015-03-26 DIAGNOSIS — R0602 Shortness of breath: Secondary | ICD-10-CM | POA: Diagnosis not present

## 2015-03-26 DIAGNOSIS — Z5112 Encounter for antineoplastic immunotherapy: Secondary | ICD-10-CM

## 2015-03-26 DIAGNOSIS — C3490 Malignant neoplasm of unspecified part of unspecified bronchus or lung: Secondary | ICD-10-CM

## 2015-03-26 DIAGNOSIS — J90 Pleural effusion, not elsewhere classified: Secondary | ICD-10-CM | POA: Diagnosis not present

## 2015-03-26 DIAGNOSIS — R634 Abnormal weight loss: Secondary | ICD-10-CM

## 2015-03-26 DIAGNOSIS — C3411 Malignant neoplasm of upper lobe, right bronchus or lung: Secondary | ICD-10-CM

## 2015-03-26 DIAGNOSIS — R530 Neoplastic (malignant) related fatigue: Secondary | ICD-10-CM

## 2015-03-26 LAB — COMPREHENSIVE METABOLIC PANEL
ALBUMIN: 2.8 g/dL — AB (ref 3.5–5.0)
ALK PHOS: 201 U/L — AB (ref 40–150)
ALT: 15 U/L (ref 0–55)
AST: 14 U/L (ref 5–34)
Anion Gap: 10 mEq/L (ref 3–11)
BILIRUBIN TOTAL: 0.44 mg/dL (ref 0.20–1.20)
BUN: 15.5 mg/dL (ref 7.0–26.0)
CO2: 29 mEq/L (ref 22–29)
CREATININE: 0.8 mg/dL (ref 0.7–1.3)
Calcium: 10 mg/dL (ref 8.4–10.4)
Chloride: 100 mEq/L (ref 98–109)
EGFR: 88 mL/min/{1.73_m2} — ABNORMAL LOW (ref >90)
GLUCOSE: 107 mg/dL (ref 70–140)
POTASSIUM: 4.1 meq/L (ref 3.5–5.1)
SODIUM: 139 meq/L (ref 136–145)
TOTAL PROTEIN: 7.2 g/dL (ref 6.4–8.3)

## 2015-03-26 LAB — CBC WITH DIFFERENTIAL/PLATELET
BASO%: 0.2 % (ref 0.0–2.0)
Basophils Absolute: 0 10*3/uL (ref 0.0–0.1)
EOS%: 0.3 % (ref 0.0–7.0)
Eosinophils Absolute: 0 10*3/uL (ref 0.0–0.5)
HCT: 33.1 % — ABNORMAL LOW (ref 38.4–49.9)
HEMOGLOBIN: 10.8 g/dL — AB (ref 13.0–17.1)
LYMPH#: 1 10*3/uL (ref 0.9–3.3)
LYMPH%: 16.9 % (ref 14.0–49.0)
MCH: 33.6 pg — ABNORMAL HIGH (ref 27.2–33.4)
MCHC: 32.6 g/dL (ref 32.0–36.0)
MCV: 103.1 fL — ABNORMAL HIGH (ref 79.3–98.0)
MONO#: 0.2 10*3/uL (ref 0.1–0.9)
MONO%: 4.1 % (ref 0.0–14.0)
NEUT#: 4.6 10*3/uL (ref 1.5–6.5)
NEUT%: 78.5 % — ABNORMAL HIGH (ref 39.0–75.0)
Platelets: 143 10*3/uL (ref 140–400)
RBC: 3.21 10*6/uL — ABNORMAL LOW (ref 4.20–5.82)
RDW: 15.2 % — AB (ref 11.0–14.6)
WBC: 5.8 10*3/uL (ref 4.0–10.3)

## 2015-03-26 MED ORDER — OXYCODONE-ACETAMINOPHEN 5-325 MG PO TABS: 1.0000 | ORAL_TABLET | Freq: Four times a day (QID) | ORAL | Status: DC | PRN

## 2015-03-26 MED ORDER — LEVOFLOXACIN 500 MG PO TABS: 500.0000 mg | ORAL_TABLET | Freq: Every day | ORAL | Status: DC

## 2015-03-26 MED ORDER — LORAZEPAM 0.5 MG PO TABS: ORAL_TABLET | ORAL | Status: DC

## 2015-03-26 NOTE — Telephone Encounter (Signed)
S/w wife re thoracentesis and brain MRI Friday at 2 and 5 respectively. At [facility] and they will direct pt to appropriate place.

## 2015-03-26 NOTE — Telephone Encounter (Signed)
Received message via emergency pager that pt having reaction to chemo therapy.  TC to patient. He states it is taking longer to recover from his gemzar and is feeling very poorly.  He has no appetite, when he does eat a little he becomes nauseated and at times vomits.  He has a cough and is coughing up green mucous. He is unable to lie down in bed on his back or either side d/t shortness of breath. " I can't sleep well at all laying down". He states he slept in his recliner last night. Denies fever, diarrhea, chills.  He states he is feeling very weak.  Advised to come in to [facility] to be evaluated this morning. He states he will leave now-it takes him almost an hour and a half to get here from his home.  Urgent pof sent with lab orders.

## 2015-03-27 ENCOUNTER — Ambulatory Visit (HOSPITAL_COMMUNITY)
Admission: RE | Admit: 2015-03-27 | Discharge: 2015-03-27 | Disposition: A | Discharge status: Home / Self Care | Payer: Medicare Other | Source: Ambulatory Visit | Attending: Radiology | Admitting: Radiology

## 2015-03-27 ENCOUNTER — Ambulatory Visit (HOSPITAL_COMMUNITY)
Admission: RE | Admit: 2015-03-27 | Discharge: 2015-03-27 | Disposition: A | Discharge status: Home / Self Care | Payer: Medicare Other | Source: Ambulatory Visit | Attending: Nurse Practitioner | Admitting: Nurse Practitioner

## 2015-03-27 ENCOUNTER — Encounter: Payer: Self-pay | Admitting: Nurse Practitioner

## 2015-03-27 ENCOUNTER — Telehealth: Payer: Self-pay | Admitting: Internal Medicine

## 2015-03-27 DIAGNOSIS — R220 Localized swelling, mass and lump, head: Secondary | ICD-10-CM | POA: Diagnosis not present

## 2015-03-27 DIAGNOSIS — Z9889 Other specified postprocedural states: Secondary | ICD-10-CM | POA: Insufficient documentation

## 2015-03-27 DIAGNOSIS — R51 Headache: Secondary | ICD-10-CM | POA: Diagnosis not present

## 2015-03-27 DIAGNOSIS — R630 Anorexia: Secondary | ICD-10-CM | POA: Insufficient documentation

## 2015-03-27 DIAGNOSIS — R112 Nausea with vomiting, unspecified: Secondary | ICD-10-CM | POA: Insufficient documentation

## 2015-03-27 DIAGNOSIS — G939 Disorder of brain, unspecified: Secondary | ICD-10-CM | POA: Insufficient documentation

## 2015-03-27 DIAGNOSIS — C3490 Malignant neoplasm of unspecified part of unspecified bronchus or lung: Secondary | ICD-10-CM | POA: Insufficient documentation

## 2015-03-27 DIAGNOSIS — J90 Pleural effusion, not elsewhere classified: Secondary | ICD-10-CM | POA: Insufficient documentation

## 2015-03-27 DIAGNOSIS — R634 Abnormal weight loss: Secondary | ICD-10-CM | POA: Insufficient documentation

## 2015-03-27 MED ORDER — LIDOCAINE HCL (PF) 1 % IJ SOLN
INTRAMUSCULAR | Status: AC
  Filled 2015-03-27: qty 10

## 2015-03-27 MED ORDER — GADOBENATE DIMEGLUMINE 529 MG/ML IV SOLN
17.0000 mL | Freq: Once | INTRAVENOUS | Status: AC | PRN
  Administered 2015-03-27: 17 mL via INTRAVENOUS

## 2015-03-27 NOTE — Assessment & Plan Note (Signed)
Patient received cycle 7, day 8 of his Gemzar chemotherapy regimen on 03/17/2015.  Patient presents to the [facility] today with complaint of increased congestive/productive cough with thick green secretions.  He is also reporting increased shortness of breath; and states he has to sleep sitting up.  He has home O2.  Also, patient states that he pain to the top of his head several weeks ago; and developed a hematoma to the area.  He continues to complain of tenderness to this area; and has a left-sided headache.  He denies any vision changes or other neurological issues.  Chest CT obtained on 03/05/2015 revealed a large right pleural effusion.  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  No intracranial mass or edema. No intracranial hemorrhage. No extra-axial fluid collection. No evidence of acute infarct  On exam today.  Patient appears increasingly fatigued and weak.  He is very thin.  Breath sounds bilaterally with mild rhonchi but no wheezing.  Also, only a mild congested cough.  Patient does not appear short of breath on exam.  Patient has a large firm, tender mass to the left top of his head.  There is no erythema, edema, or red streaks to the site.  There is also no drainage from the site.  Due to all of these findings.-We will order right therapeutic thoracentesis and a brain MRI for further evaluation.  Unfortunately, patient was unable to obtain these procedures at [facility]; and we'll have to go to: Hospital tomorrow, 03/27/2015 to obtain these procedures.  Gave patient a small prescription for Ativan to take prior to the MRI due to anxiety.  The plan is to delay chemotherapy by one week to allow patient to recover.  Will change patient's  previously scheduled appointments on 04/01/2015 to labs, visit, and chemotherapy on 04/08/2015 instead.

## 2015-03-27 NOTE — Assessment & Plan Note (Signed)
Patient states he has had minimal appetite; has lost approximately 10 pounds since his last weight check.  Patient was encouraged protein in his diet.  Eat multiple small meals throughout the day.

## 2015-03-27 NOTE — Telephone Encounter (Signed)
Aware of appointments per pof and will leave 1.10 as is

## 2015-03-27 NOTE — Progress Notes (Signed)
SYMPTOM MANAGEMENT CLINIC   HPI: Terry Robles 74 y.o. male diagnosed with lung cancer.  Patient is status post immunotherapy and radiation treatments.  Currently undergoing gemcitabine chemotherapy regimen.   Patient received cycle 7, day 8 of his Gemzar chemotherapy regimen on 03/17/2015.  Patient presents to the [facility] [facility] today with complaint of increased congestive/productive cough with thick green secretions.  He is also reporting increased shortness of breath; and states he has to sleep sitting up.  He has home O2.  Also, patient states that he pain to the top of his head several weeks ago; and developed a hematoma to the area.  He continues to complain of tenderness to this area; and has a left-sided headache.  He denies any vision changes or other neurological issues.  Chest CT obtained on 03/05/2015 revealed a large right pleural effusion.  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  No intracranial mass or edema. No intracranial hemorrhage. No extra-axial fluid collection. No evidence of acute infarct  On exam today.  Patient appears increasingly fatigued and weak.  He is very thin.  Breath sounds bilaterally with mild rhonchi but no wheezing.  Also, only a mild congested cough.  Patient does not appear short of breath on exam.  Patient has a large firm, tender mass to the left top of his head.  There is no erythema, edema, or red streaks to the site.  There is also no drainage from the site.  Due to all of these findings.-We will order right therapeutic thoracentesis and a brain MRI for further evaluation.  Unfortunately, patient was unable to obtain these procedures at [facility]; and we'll have to go to: Hospital tomorrow, 03/27/2015 to  obtain these procedures.  Gave patient a small prescription for Ativan to take prior to the MRI due to anxiety.  The plan is to delay chemotherapy by one week to allow patient to recover.  Will change patient's previously scheduled appointments on 04/01/2015 to labs, visit, and chemotherapy on 04/08/2015 instead.   HPI  ROS  Past Medical History  Diagnosis Date  . Hypercholesteremia 05/14/2011  . BPH (benign prostatic hyperplasia) 05/14/2011    takes Uroxatral daily  . GERD (gastroesophageal reflux disease)   . Shortness of breath     weather related  . Pneumonia 1965  . Headache(784.0)     occasionally  . Joint pain   . Maintenance chemotherapy   . Urinary frequency   . History of shingles   . Anemia   . Acquired thrombocytopenia   . 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 Cancer (HCC)     squamous cell carcinoma right superior sulcus  . Lung cancer ([place])     right lung    Past Surgical History  Procedure Laterality Date  . Other surgical history      benign tumor removed from left leg several years ago  . Other surgical history      had melanoma/squamous cell removed previously per patient  . Tumor excision      Left knee  . Colonoscopy w/ biopsies and polypectomy    . Video bronchoscopy N/A 07/30/2013    Procedure: VIDEO BRONCHOSCOPY;  Surgeon: Grace Isaac, MD;  Location: [place];  Service: Thoracic;  Laterality:  N/A;  . Mediastinoscopy N/A 07/30/2013    Procedure: MEDIASTINOSCOPY;  Surgeon: Grace Isaac, MD;  Location: [place];  Service: Thoracic;  Laterality: N/A;  . Video assisted thoracoscopy (vats)/wedge resection Right 07/30/2013    Procedure: VIDEO ASSISTED THORACOSCOPY;  Surgeon: Grace Isaac, MD;  Location: [place];  Service: Thoracic;  Laterality: Right;  . Thoracotomy  07/30/2013    Procedure: THORACOTOMY FOR CHEST WALL RESECTION, RIGHT UPPER LOBECTOMY;  Surgeon: Grace Isaac, MD;  Location: [place];  Service: Thoracic;;  . Adenoidectomy   as a child  . Tonsillectomy    . Portacath placement Left 10/30/2013    Procedure: INSERTION PORT-A-CATH WITH ULTRASOUND GUIDANCE AND FLUORO;  Surgeon: Grace Isaac, MD;  Location: [place];  Service: Thoracic;  Laterality: Left;    has Chest pain at rest; Hypercholesteremia; BPH (benign prostatic hyperplasia); Squamous cell lung cancer ([place]); Primary malignant neoplasm of left upper lobe of lung ([place]); Arm pain, left; Superficial venous thrombosis of arm; Pneumothorax, right; Protein-calorie malnutrition, severe ([place]); Fatigue; Hypoalbuminemia; Encounter for antineoplastic immunotherapy; Encounter for antineoplastic chemotherapy; Anorexia; Weight loss; Nausea with vomiting; and Pleural effusion on his problem list.    has No Known Allergies.    Medication List       This list is accurate as of: 03/26/15 11:59 PM.  Always use your most recent med list.               alfuzosin 10 MG 24 hr tablet  Commonly known as:  UROXATRAL  Take 10 mg by mouth daily with breakfast.     clobetasol cream 0.05 %  Commonly known as:  TEMOVATE  Apply 1 application topically 2 (two) times daily.     cycloSPORINE 0.05 % ophthalmic emulsion  Commonly known as:  RESTASIS  1 drop 2 (two) times daily.     docusate sodium 100 MG capsule  Commonly known as:  COLACE  Take 100 mg by mouth daily.     feeding supplement Liqd  Take 1 Container by mouth daily at 3 pm.     HYDROcodone-homatropine 5-1.5 MG/5ML syrup  Commonly known as:  HYCODAN  Take 5 mLs by mouth every 6 (six) hours as needed for cough.     levofloxacin 500 MG tablet  Commonly known as:  LEVAQUIN  Take 1 tablet (500 mg total) by mouth daily.     LORazepam 0.5 MG tablet  Commonly known as:  ATIVAN  Take 1 to 2 tablets PO prior to MRI as needed for anxiety.     multivitamin with minerals Tabs tablet  Take 1 tablet by mouth daily.     oxyCODONE-acetaminophen 5-325 MG tablet  Commonly known as:  PERCOCET/ROXICET  Take 1 tablet by  mouth every 6 (six) hours as needed for severe pain.     polyethylene glycol packet  Commonly known as:  MIRALAX / GLYCOLAX  Take 17 g by mouth daily.     prochlorperazine 5 MG tablet  Commonly known as:  COMPAZINE  Take 1 tablet (5 mg total) by mouth every 6 (six) hours as needed for nausea or vomiting.     temazepam 15 MG capsule  Commonly known as:  RESTORIL  Take 1 capsule (15 mg total) by mouth at bedtime as needed for sleep.     temazepam 15 MG capsule  Commonly known as:  RESTORIL  Take 1 capsule (15 mg total) by mouth at bedtime as needed for sleep.  PHYSICAL EXAMINATION  Oncology Vitals 03/26/2015 03/17/2015  Height 188 cm -  Weight 79.697 kg -  Weight (lbs) 175 lbs 11 oz -  BMI (kg/m2) 22.56 kg/m2 -  Temp 98.6 97.9  Pulse 85 78  Resp 18 18  SpO2 100 100  BSA (m2) 2.04 m2 -   BP Readings from Last 2 Encounters:  03/26/15 116/71  03/17/15 150/72    Physical Exam  Constitutional: He is oriented to person, place, and time. Vital signs are normal. He appears malnourished. He appears unhealthy. He appears cachectic.  Patient appears fatigued and chronically ill.  HENT:  Head: Normocephalic and atraumatic.  Mouth/Throat: Oropharynx is clear and moist.  Patient has a large firm, tender mass to the left top of his head.  Eyes: Conjunctivae and EOM are normal. Pupils are equal, round, and reactive to light. Right eye exhibits no discharge. Left eye exhibits no discharge. No scleral icterus.  Neck: Normal range of motion. Neck supple. No JVD present. No tracheal deviation present. No thyromegaly present.  Cardiovascular: Normal rate, regular rhythm, normal heart sounds and intact distal pulses.   Pulmonary/Chest: Effort normal. No respiratory distress. He has no wheezes. He has rales. He exhibits no tenderness.  Abdominal: Soft. Bowel sounds are normal. He exhibits no distension and no mass. There is no tenderness. There is no rebound and no guarding.    Musculoskeletal: Normal range of motion. He exhibits no edema or tenderness.  Lymphadenopathy:    He has no cervical adenopathy.  Neurological: He is alert and oriented to person, place, and time. Gait normal.  Skin: Skin is warm and dry. No rash noted. No erythema. There is pallor.  Psychiatric: Affect normal.  Nursing note and vitals reviewed.   LABORATORY DATA:. Appointment on 03/26/2015  Component Date Value Ref Range Status  . WBC 03/26/2015 5.8  4.0 - 10.3 10e3/uL Final  . NEUT# 03/26/2015 4.6  1.5 - 6.5 10e3/uL Final  . HGB 03/26/2015 10.8* 13.0 - 17.1 g/dL Final  . HCT 03/26/2015 33.1* 38.4 - 49.9 % Final  . Platelets 03/26/2015 143  140 - 400 10e3/uL Final  . MCV 03/26/2015 103.1* 79.3 - 98.0 fL Final  . MCH 03/26/2015 33.6* 27.2 - 33.4 pg Final  . MCHC 03/26/2015 32.6  32.0 - 36.0 g/dL Final  . RBC 03/26/2015 3.21* 4.20 - 5.82 10e6/uL Final  . RDW 03/26/2015 15.2* 11.0 - 14.6 % Final  . lymph# 03/26/2015 1.0  0.9 - 3.3 10e3/uL Final  . MONO# 03/26/2015 0.2  0.1 - 0.9 10e3/uL Final  . Eosinophils Absolute 03/26/2015 0.0  0.0 - 0.5 10e3/uL Final  . Basophils Absolute 03/26/2015 0.0  0.0 - 0.1 10e3/uL Final  . NEUT% 03/26/2015 78.5* 39.0 - 75.0 % Final  . LYMPH% 03/26/2015 16.9  14.0 - 49.0 % Final  . MONO% 03/26/2015 4.1  0.0 - 14.0 % Final  . EOS% 03/26/2015 0.3  0.0 - 7.0 % Final  . BASO% 03/26/2015 0.2  0.0 - 2.0 % Final  . Sodium 03/26/2015 139  136 - 145 mEq/L Final  . Potassium 03/26/2015 4.1  3.5 - 5.1 mEq/L Final  . Chloride 03/26/2015 100  98 - 109 mEq/L Final  . CO2 03/26/2015 29  22 - 29 mEq/L Final  . Glucose 03/26/2015 107  70 - 140 mg/dl Final   Glucose reference range is for nonfasting patients. Fasting glucose reference range is 70- 100.  Marland Kitchen BUN 03/26/2015 15.5  7.0 - 26.0 mg/dL Final  . Creatinine  03/26/2015 0.8  0.7 - 1.3 mg/dL Final  . Total Bilirubin 03/26/2015 0.44  0.20 - 1.20 mg/dL Final  . Alkaline Phosphatase 03/26/2015 201* 40 - 150 U/L Final   . AST 03/26/2015 14  5 - 34 U/L Final  . ALT 03/26/2015 15  0 - 55 U/L Final  . Total Protein 03/26/2015 7.2  6.4 - 8.3 g/dL Final  . Albumin 03/26/2015 2.8* 3.5 - 5.0 g/dL Final  . Calcium 03/26/2015 10.0  8.4 - 10.4 mg/dL Final  . Anion Gap 03/26/2015 10  3 - 11 mEq/L Final  . EGFR 03/26/2015 88* >90 ml/min/1.73 m2 Final   eGFR is calculated using the CKD-EPI Creatinine Equation (2009)     RADIOGRAPHIC STUDIES: No results found.  ASSESSMENT/PLAN:    Primary malignant neoplasm of left upper lobe of lung ([place]) Patient received cycle 7, day 8 of his Gemzar chemotherapy regimen on 03/17/2015.  Patient presents to the [facility] today with complaint of increased congestive/productive cough with thick green secretions.  He is also reporting increased shortness of breath; and states he has to sleep sitting up.  He has home O2.  Also, patient states that he pain to the top of his head several weeks ago; and developed a hematoma to the area.  He continues to complain of tenderness to this area; and has a left-sided headache.  He denies any vision changes or other neurological issues.  Chest CT obtained on 03/05/2015 revealed a large right pleural effusion.  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  No intracranial mass or edema. No intracranial hemorrhage. No extra-axial fluid collection. No evidence of acute infarct  On exam today.  Patient appears increasingly fatigued and weak.  He is very thin.  Breath sounds bilaterally with mild rhonchi but no wheezing.  Also, only a mild congested cough.  Patient does not appear short of breath on exam.  Patient has a large firm, tender mass to the left top of his head.  There is no erythema, edema, or red streaks to the  site.  There is also no drainage from the site.  Due to all of these findings.-We will order right therapeutic thoracentesis and a brain MRI for further evaluation.  Unfortunately, patient was unable to obtain these procedures at [facility]; and we'll have to go to: Hospital tomorrow, 03/27/2015 to obtain these procedures.  Gave patient a small prescription for Ativan to take prior to the MRI due to anxiety.  The plan is to delay chemotherapy by one week to allow patient to recover.  Will change patient's previously scheduled appointments on 04/01/2015 to labs, visit, and chemotherapy on 04/08/2015 instead.    Hypoalbuminemia Albumen decreased to 2.8.  Patient was encouraged to push protein in his diet is much as possible.  Anorexia Patient states he has had minimal appetite; has lost approximately 10 pounds since his last weight check.  Patient was encouraged protein in his diet.  Eat multiple small meals throughout the day.  Weight loss Patient states he's had minimal appetite recently; this lost approximately 10 pounds since his last weight check.  Patient was encouraged to push protein in his diet and eat multiple small meals throughout the day.  Nausea with vomiting Patient complains of some chronic nausea; and only occasional vomiting.  Patient already  has antinausea medications at home to take on an as-needed basis.  Pleural effusion Chest CT obtained on 03/05/2015 revealed a large pleural effusion on the right.  Patient is complaining of a congested cough and increased shortness of breath to my: Despite using home O2 via nasal cannula.  Have arranged for patient to undergo a ultrasound-guided thoracentesis tomorrow, 03/27/2015.  Fatigue Patient reports increased fatigue and weakness and general for the past few weeks.  He also has had minimal appetite and poor oral intake.  Patient states that he does not feel dehydrated; and feels that he can push both fluids and protein  shakes at home.  Patient was also encouraged to remain as active as possible.  Patient stated understanding of all instructions; and was in agreement with this plan of care. The patient knows to call the clinic with any problems, questions or concerns.   This was a shared visit with Dr. Julien Nordmann.  Total time spent with patient was 40 minutes;  with greater than 75 percent of that time spent in face to face counseling regarding patient's symptoms,  and coordination of care and follow up.  Disclaimer:This dictation was prepared with Dragon/digital dictation along with [employer]. Any transcriptional errors that result from this process are unintentional.  Drue Second, NP 03/27/2015   ADDENDUM: Hematology/Oncology Attending: I had a face to face encounter with the patient. I recommended his care plan. This is a very pleasant 74 years old white male with recurrent non-small cell lung cancer, squamous cell carcinoma who is currently undergoing systemic chemotherapy with single agent gemcitabine status post 7 cycles. He has been tolerating his treatment fairly well. The patient came to the clinic today complaining of increasing shortness of breath, cough productive of greenish sputum as well as headache and pain from the skull lesion. CT of the head performed recently was concerning for metastatic disease in that area with lytic lesion. The patient also has known large pleural effusion but he was not symptomatic at the time of his CT scan of the chest. I recommended for the patient to have MRI of the brain to rule out any other brain metastasis and further evaluation of the skull lesion. I would consider referring him to radiation oncology for palliative radiotherapy to this area if consistent with metastatic disease. For the shortness breath, I will arrange for the patient to have ultrasound-guided thoracentesis of the right pleural effusion. We will also start the patient empirically on  Levaquin 500 mg by mouth daily for 7 days I also had a lengthy discussion with the patient his wife about his current disease status and treatment options. I was him the option of palliative care and hospice referral at this point versus continuous treatment with systemic chemotherapy. The patient is currently too weak to start any treatment and we will delay the start of cycle #8 by one more week. During this time the patient was discussed with his wife with her to continue with further treatment or seek palliative care and hospice. I will see him back for follow-up visit in 2 weeks for reevaluation and more detailed discussion of his treatment options. The patient was advised to call immediately if he has any concerning symptoms in the interval.   Disclaimer: This note was dictated with voice recognition software. Similar sounding words can inadvertently be transcribed and may be missed upon review. [name]., MD 03/28/2015

## 2015-03-27 NOTE — Assessment & Plan Note (Signed)
Patient complains of some chronic nausea; and only occasional vomiting.  Patient already has antinausea medications at home to take on an as-needed basis.

## 2015-03-27 NOTE — Assessment & Plan Note (Signed)
Chest CT obtained on 03/05/2015 revealed a large pleural effusion on the right.  Patient is complaining of a congested cough and increased shortness of breath to my: Despite using home O2 via nasal cannula.  Have arranged for patient to undergo a ultrasound-guided thoracentesis tomorrow, 03/27/2015.

## 2015-03-27 NOTE — Assessment & Plan Note (Signed)
Albumen decreased to 2.8.  Patient was encouraged to push protein in his diet is much as possible.

## 2015-03-27 NOTE — Assessment & Plan Note (Signed)
Patient states he's had minimal appetite recently; this lost approximately 10 pounds since his last weight check.  Patient was encouraged to push protein in his diet and eat multiple small meals throughout the day.

## 2015-03-27 NOTE — Assessment & Plan Note (Signed)
Patient reports increased fatigue and weakness and general for the past few weeks.  He also has had minimal appetite and poor oral intake.  Patient states that he does not feel dehydrated; and feels that he can push both fluids and protein shakes at home.  Patient was also encouraged to remain as active as possible.

## 2015-03-27 NOTE — Procedures (Signed)
   US guided Rt thoracentesis 1 liter blood tinged fluid  Developed dizziness; slight chest pain procedure stopped  BP 107/66

## 2015-03-31 ENCOUNTER — Encounter: Payer: Self-pay | Admitting: Nurse Practitioner

## 2015-03-31 ENCOUNTER — Ambulatory Visit (HOSPITAL_BASED_OUTPATIENT_CLINIC_OR_DEPARTMENT_OTHER): Payer: Medicare Other | Admitting: Nurse Practitioner

## 2015-03-31 ENCOUNTER — Other Ambulatory Visit (HOSPITAL_BASED_OUTPATIENT_CLINIC_OR_DEPARTMENT_OTHER): Payer: Medicare Other

## 2015-03-31 VITALS — BP 94/54 | HR 101 | Temp 97.8°F | Resp 17 | Ht 74.0 in | Wt 174.4 lb

## 2015-03-31 DIAGNOSIS — C3411 Malignant neoplasm of upper lobe, right bronchus or lung: Secondary | ICD-10-CM

## 2015-03-31 DIAGNOSIS — C3412 Malignant neoplasm of upper lobe, left bronchus or lung: Secondary | ICD-10-CM

## 2015-03-31 DIAGNOSIS — G939 Disorder of brain, unspecified: Secondary | ICD-10-CM

## 2015-03-31 LAB — COMPREHENSIVE METABOLIC PANEL
ALBUMIN: 2.7 g/dL — AB (ref 3.5–5.0)
ALK PHOS: 161 U/L — AB (ref 40–150)
ALT: 11 U/L (ref 0–55)
AST: 16 U/L (ref 5–34)
Anion Gap: 9 mEq/L (ref 3–11)
BUN: 19.4 mg/dL (ref 7.0–26.0)
CALCIUM: 10.1 mg/dL (ref 8.4–10.4)
CHLORIDE: 99 meq/L (ref 98–109)
CO2: 33 mEq/L — ABNORMAL HIGH (ref 22–29)
CREATININE: 0.9 mg/dL (ref 0.7–1.3)
EGFR: 85 mL/min/{1.73_m2} — ABNORMAL LOW (ref >90)
GLUCOSE: 113 mg/dL (ref 70–140)
POTASSIUM: 4 meq/L (ref 3.5–5.1)
SODIUM: 141 meq/L (ref 136–145)
Total Bilirubin: <0.3 mg/dL (ref 0.20–1.20)
Total Protein: 7.1 g/dL (ref 6.4–8.3)

## 2015-03-31 LAB — CBC WITH DIFFERENTIAL/PLATELET
BASO%: 0.2 % (ref 0.0–2.0)
Basophils Absolute: 0 10*3/uL (ref 0.0–0.1)
EOS%: 1 % (ref 0.0–7.0)
Eosinophils Absolute: 0.1 10*3/uL (ref 0.0–0.5)
HCT: 33.6 % — ABNORMAL LOW (ref 38.4–49.9)
HGB: 11 g/dL — ABNORMAL LOW (ref 13.0–17.1)
LYMPH%: 4.5 % — AB (ref 14.0–49.0)
MCH: 33.1 pg (ref 27.2–33.4)
MCHC: 32.7 g/dL (ref 32.0–36.0)
MCV: 101.3 fL — ABNORMAL HIGH (ref 79.3–98.0)
MONO#: 1.2 10*3/uL — ABNORMAL HIGH (ref 0.1–0.9)
MONO%: 15.9 % — ABNORMAL HIGH (ref 0.0–14.0)
NEUT#: 6.1 10*3/uL (ref 1.5–6.5)
NEUT%: 78.4 % — AB (ref 39.0–75.0)
Platelets: 413 10*3/uL — ABNORMAL HIGH (ref 140–400)
RBC: 3.32 10*6/uL — AB (ref 4.20–5.82)
RDW: 15.6 % — ABNORMAL HIGH (ref 11.0–14.6)
WBC: 7.8 10*3/uL (ref 4.0–10.3)
lymph#: 0.3 10*3/uL — ABNORMAL LOW (ref 0.9–3.3)

## 2015-03-31 NOTE — Assessment & Plan Note (Signed)
Patient presented to the [facility] today to review his recent brain MRI results.  Patient also underwent a therapeutic right thoracentesis on 03/27/2015 for a right pleural effusion.  Thoracentesis drew off approximately 1 L of bloody pleural fluid at that time.  Patient states he is feeling some better; is breathing is easier.  He denies any recent fevers or chills.  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  Questionable left occipital 2 mm lesion (series 15, image 21) may represent a metastatic lesion.  This is a call report.  Dr. Julien Nordmann in to review results with both patient and his wife in detail.  Decision was made to hold any further chemotherapy for the time being and; and to obtain a radiation oncology referral for possible brain radiation.  Will reschedule for labs and a follow-up visit with Dr. Julien Nordmann in approximately 3 weeks for further evaluation.

## 2015-03-31 NOTE — Progress Notes (Signed)
SYMPTOM MANAGEMENT CLINIC   HPI: Terry Robles 75 y.o. male diagnosed with lung cancer.  Patient is status post immunotherapy and radiation treatments.  Currently undergoing gemcitabine chemotherapy regimen.   Patient received cycle 7, day 8 of his Gemzar chemotherapy regimen on 03/17/2015.  Patient presented to the [facility] today to review his recent brain MRI results.  Patient also underwent a therapeutic right thoracentesis on 03/27/2015 for a right pleural effusion.  Thoracentesis drew off approximately 1 L of bloody pleural fluid at that time.  Patient states he is feeling some better; is breathing is easier.  He denies any recent fevers or chills.  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  Questionable left occipital 2 mm lesion (series 15, image 21) may represent a metastatic lesion.  This is a call report.  Dr. Julien Nordmann in to review results with both patient and his wife in detail.  Decision was made to hold any further chemotherapy for the time being and; and to obtain a radiation oncology referral for possible brain radiation.  Will reschedule for labs and a follow-up visit with Dr. Julien Nordmann in approximately 3 weeks for further evaluation.  HPI  ROS  Past Medical History  Diagnosis Date  . Hypercholesteremia 05/14/2011  . BPH (benign prostatic hyperplasia) 05/14/2011    takes Uroxatral daily  . GERD (gastroesophageal reflux disease)   . Shortness of breath     weather related  . Pneumonia 1965  . Headache(784.0)     occasionally  . Joint pain   . Maintenance chemotherapy   . Urinary frequency   . History of  shingles   . Anemia   . Acquired thrombocytopenia   . 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 Cancer (HCC)     squamous cell carcinoma right superior sulcus  . Lung cancer ([place])     right lung    Past Surgical History  Procedure Laterality Date  . Other surgical history      benign tumor removed from left leg several years ago  . Other surgical history      had melanoma/squamous cell removed previously per patient  . Tumor excision      Left knee  . Colonoscopy w/ biopsies and polypectomy    . Video bronchoscopy N/A 07/30/2013    Procedure: VIDEO BRONCHOSCOPY;  Surgeon: Grace Isaac, MD;  Location: [facility];  Service: Thoracic;  Laterality: N/A;  . Mediastinoscopy N/A 07/30/2013    Procedure: MEDIASTINOSCOPY;  Surgeon: Grace Isaac, MD;  Location: [facility] [place];  Service: Thoracic;  Laterality: N/A;  . Video assisted thoracoscopy (vats)/wedge resection Right 07/30/2013    Procedure: VIDEO ASSISTED THORACOSCOPY;  Surgeon: Grace Isaac, MD;  Location: [facility];  Service: Thoracic;  Laterality: Right;  . Thoracotomy  07/30/2013    Procedure: THORACOTOMY FOR CHEST WALL RESECTION, RIGHT UPPER LOBECTOMY;  Surgeon: Grace Isaac, MD;  Location: [facility];  Service: Thoracic;;  . Adenoidectomy  as a child  . Tonsillectomy    . Portacath placement Left 10/30/2013    Procedure: INSERTION PORT-A-CATH WITH ULTRASOUND GUIDANCE AND FLUORO;  Surgeon: Grace Isaac, MD;  Location:  [facility];  Service: Thoracic;  Laterality: Left;    has Chest pain at rest; Hypercholesteremia; BPH (benign prostatic hyperplasia); Squamous cell lung cancer ([place]); Primary malignant neoplasm of left upper lobe of lung ([place]); Arm pain, left; Superficial venous thrombosis of arm; Pneumothorax, right; Protein-calorie malnutrition, severe ([place]); Fatigue; Hypoalbuminemia; Encounter for antineoplastic immunotherapy; Encounter for antineoplastic chemotherapy; Anorexia; Weight loss; Nausea with vomiting; and Pleural effusion  on his problem list.    has No Known Allergies.    Medication List       This list is accurate as of: 03/31/15  5:39 PM.  Always use your most recent med list.               alfuzosin 10 MG 24 hr tablet  Commonly known as:  UROXATRAL  Take 10 mg by mouth daily with breakfast.     clobetasol cream 0.05 %  Commonly known as:  TEMOVATE  Apply 1 application topically 2 (two) times daily.     cycloSPORINE 0.05 % ophthalmic emulsion  Commonly known as:  RESTASIS  1 drop 2 (two) times daily.     docusate sodium 100 MG capsule  Commonly known as:  COLACE  Take 100 mg by mouth daily.     feeding supplement Liqd  Take 1 Container by mouth daily at 3 pm.     HYDROcodone-homatropine 5-1.5 MG/5ML syrup  Commonly known as:  HYCODAN  Take 5 mLs by mouth every 6 (six) hours as needed for cough.     levofloxacin 500 MG tablet  Commonly known as:  LEVAQUIN  Take 1 tablet (500 mg total) by mouth daily.     LORazepam 0.5 MG tablet  Commonly known as:  ATIVAN  Take 1 to 2 tablets PO prior to MRI as needed for anxiety.     multivitamin with minerals Tabs tablet  Take 1 tablet by mouth daily.     oxyCODONE-acetaminophen 5-325 MG tablet  Commonly known as:  PERCOCET/ROXICET  Take 1 tablet by mouth every 6 (six) hours as needed for severe pain.     polyethylene glycol packet  Commonly known as:  MIRALAX / GLYCOLAX  Take 17 g by mouth daily.     prochlorperazine 5 MG tablet  Commonly known as:  COMPAZINE  Take 1 tablet (5 mg total) by mouth every 6 (six) hours as needed for nausea or vomiting.     temazepam 15 MG capsule  Commonly known as:  RESTORIL  Take 1 capsule (15 mg total) by mouth at bedtime as needed for sleep.     temazepam 15 MG capsule  Commonly known as:  RESTORIL  Take 1 capsule (15 mg total) by mouth at bedtime as needed for sleep.         PHYSICAL EXAMINATION  Oncology Vitals 03/31/2015 03/26/2015  Height 188 cm 188 cm  Weight 79.107 kg 79.697 kg  Weight  (lbs) 174 lbs 6 oz 175 lbs 11 oz  BMI (kg/m2) 22.39 kg/m2 22.56 kg/m2  Temp 97.8 98.6  Pulse 101 85  Resp 17 18  SpO2 100 100  BSA (m2) 2.03 m2 2.04 m2   BP Readings from Last 2 Encounters:  03/31/15 94/54  03/27/15 111/66    Physical Exam  Constitutional: He is oriented to person, place, and time. Vital signs are normal. He appears malnourished. He appears unhealthy. He appears cachectic.  Patient appears fatigued and chronically ill.  HENT:  Head: Normocephalic and atraumatic.  Patient has a large firm, tender mass  to the left top of his head.  Eyes: Conjunctivae and EOM are normal. Pupils are equal, round, and reactive to light. Right eye exhibits no discharge. Left eye exhibits no discharge. No scleral icterus.  Neck: Normal range of motion. Neck supple.  Pulmonary/Chest: Effort normal. No respiratory distress.  Abdominal: Soft. Bowel sounds are normal.  Musculoskeletal: Normal range of motion.  Neurological: He is alert and oriented to person, place, and time. Gait normal.  Skin: Skin is warm and dry. There is pallor.  Psychiatric: Affect normal.  Nursing note and vitals reviewed.   LABORATORY DATA:. Appointment on 03/31/2015  Component Date Value Ref Range Status  . WBC 03/31/2015 7.8  4.0 - 10.3 10e3/uL Final  . NEUT# 03/31/2015 6.1  1.5 - 6.5 10e3/uL Final  . HGB 03/31/2015 11.0* 13.0 - 17.1 g/dL Final  . HCT 03/31/2015 33.6* 38.4 - 49.9 % Final  . Platelets 03/31/2015 413* 140 - 400 10e3/uL Final  . MCV 03/31/2015 101.3* 79.3 - 98.0 fL Final  . MCH 03/31/2015 33.1  27.2 - 33.4 pg Final  . MCHC 03/31/2015 32.7  32.0 - 36.0 g/dL Final  . RBC 03/31/2015 3.32* 4.20 - 5.82 10e6/uL Final  . RDW 03/31/2015 15.6* 11.0 - 14.6 % Final  . lymph# 03/31/2015 0.3* 0.9 - 3.3 10e3/uL Final  . MONO# 03/31/2015 1.2* 0.1 - 0.9 10e3/uL Final  . Eosinophils Absolute 03/31/2015 0.1  0.0 - 0.5 10e3/uL Final  . Basophils Absolute 03/31/2015 0.0  0.0 - 0.1 10e3/uL Final  . NEUT%  03/31/2015 78.4* 39.0 - 75.0 % Final  . LYMPH% 03/31/2015 4.5* 14.0 - 49.0 % Final  . MONO% 03/31/2015 15.9* 0.0 - 14.0 % Final  . EOS% 03/31/2015 1.0  0.0 - 7.0 % Final  . BASO% 03/31/2015 0.2  0.0 - 2.0 % Final  . Sodium 03/31/2015 141  136 - 145 mEq/L Final  . Potassium 03/31/2015 4.0  3.5 - 5.1 mEq/L Final  . Chloride 03/31/2015 99  98 - 109 mEq/L Final  . CO2 03/31/2015 33* 22 - 29 mEq/L Final  . Glucose 03/31/2015 113  70 - 140 mg/dl Final   Glucose reference range is for nonfasting patients. Fasting glucose reference range is 70- 100.  Marland Kitchen BUN 03/31/2015 19.4  7.0 - 26.0 mg/dL Final  . Creatinine 03/31/2015 0.9  0.7 - 1.3 mg/dL Final  . Total Bilirubin 03/31/2015 <0.30  0.20 - 1.20 mg/dL Final  . Alkaline Phosphatase 03/31/2015 161* 40 - 150 U/L Final  . AST 03/31/2015 16  5 - 34 U/L Final  . ALT 03/31/2015 11  0 - 55 U/L Final  . Total Protein 03/31/2015 7.1  6.4 - 8.3 g/dL Final  . Albumin 03/31/2015 2.7* 3.5 - 5.0 g/dL Final  . Calcium 03/31/2015 10.1  8.4 - 10.4 mg/dL Final  . Anion Gap 03/31/2015 9  3 - 11 mEq/L Final  . EGFR 03/31/2015 85* >90 ml/min/1.73 m2 Final   eGFR is calculated using the CKD-EPI Creatinine Equation (2009)    RADIOGRAPHIC STUDIES: No results found.  ASSESSMENT/PLAN:    Primary malignant neoplasm of left upper lobe of lung [facility]) Patient presented to the [facility] today to review his recent brain MRI results.  Patient also underwent a therapeutic right thoracentesis on 03/27/2015 for a right pleural effusion.  Thoracentesis drew off approximately 1 L of bloody pleural fluid at that time.  Patient states he is feeling some better; is breathing is easier.  He denies any recent fevers or chills.  Brain MRI results revealed: IMPRESSION: Left parietal calvarial 4.8 x 4.4 x 2.8 cm destructive mass with extension into the overlying subcutaneous tissue/ scalp as well as extension into the superior left lateral margin of the superior sagittal sinus  which is slightly narrowed but patent.  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  Questionable left occipital 2 mm lesion (series 15, image 21) may represent a metastatic lesion.  This is a call report.  Dr. Julien Nordmann in to review results with both patient and his wife in detail.  Decision was made to hold any further chemotherapy for the time being and; and to obtain a radiation oncology referral for possible brain radiation.  Will reschedule for labs and a follow-up visit with Dr. Julien Nordmann in approximately 3 weeks for further evaluation.       Patient stated understanding of all instructions; and was in agreement with this plan of care. The patient knows to call the clinic with any problems, questions or concerns.   This was a shared visit with Dr. Julien Nordmann.  Total time spent with patient was 25 minutes;  with greater than 75 percent of that time spent in face to face counseling regarding patient's symptoms,  and coordination of care and follow up.  Disclaimer:This dictation was prepared with Dragon/digital dictation along with [employer]. Any transcriptional errors that result from this process are unintentional.  Drue Second, NP 03/31/2015   ADDENDUM: Hematology/Oncology Attending: I had a face to face encounter with the patient today. I recommended his care plan. This is a very pleasant 75 years old white male with metastatic non-small cell lung cancer, squamous cell carcinoma who is currently undergoing systemic chemotherapy with single agent gemcitabine last dose was given 03/17/2015 and the patient is currently on observation. He had scalp lesion that has been increasing in size over the last few weeks. I ordered MRI of the brain which showed left parietal calvarial destructive mass with extension into the overlying subcutaneous tissue/scalp as well as extension into the superior left lateral margin of the  superior sagittal sinus. He also has few subcentimeter parenchymal lesion suspicious for brain metastasis. I had a lengthy discussion with the patient and his wife today about his current disease status and treatment options. I recommended for the patient to see Dr. Tammi Klippel for evaluation and consideration of palliative radiotherapy to the brain lesions. I also has evidence discussion with the patient and his wife about his current condition and treatment options. I would him the option of palliative care and hospice referral versus consideration of further systemic chemotherapy after completion of the brain irradiation. The patient is still thinking about his options but his wife is more interested in the palliative care option at this point. I will arrange for the patient to come back for follow-up visit in 3 weeks for reevaluation and more discussion of his treatment options. He also recently underwent right-sided thoracentesis with drainage of 1 L of bloody pleural fluid and he felt much better regarding his shortness of breath after the thoracentesis. The patient was advised to call immediately if he has any other concerning symptoms in the interval.  Disclaimer: This note was dictated with voice recognition software. Similar sounding words can inadvertently be transcribed and may be missed upon review. [name]., MD 03/31/2015

## 2015-04-01 ENCOUNTER — Ambulatory Visit: Payer: Medicare Other | Admitting: Physician Assistant

## 2015-04-01 ENCOUNTER — Telehealth: Payer: Self-pay | Admitting: Internal Medicine

## 2015-04-01 ENCOUNTER — Ambulatory Visit: Payer: Medicare Other

## 2015-04-01 ENCOUNTER — Encounter: Payer: Self-pay | Admitting: Radiation Oncology

## 2015-04-01 ENCOUNTER — Other Ambulatory Visit: Payer: Medicare Other

## 2015-04-01 NOTE — Telephone Encounter (Signed)
Called patient and he is aware of his new dr Julien Nordmann appointments and aware that rad onc will call him with his visit,requested also to cancel the nutrition appt at this time

## 2015-04-01 NOTE — Progress Notes (Signed)
Hi, [facility] only has a 10:00 am opening.  Does it work for you to see him at 9:00?    Terry Robles

## 2015-04-01 NOTE — Progress Notes (Signed)
Location/Histology of Brain Tumor: History of lung cancer post radiation therapy and immunotherapy. Left parietal calvarial 4.8 x 4.4 x 2.8 cm destructive mass with extension into the overlying subcutaneous tissue/ scalp as well as extension into the superior left lateral margin of the superiorsagittal sinus which is slightly narrowed but patent. Left cerebellar 2.4 mm nodular lesion suggestive of metastatic lesion. Questionable left occipital 2 mm lesion may represent a metastatic lesion.  Patient presented with symptoms of: Reports daily Left-sided headaches just above his eye 5-6 on a scale of 0-10 which he manages with Percocet he was prescribed for shoulder pain. Reports he has not fallen. Explains he was screwing in a bolt under the sink in his home and brushed the top of his head on the cabinet. He believe the raised area on his scalp was a result of this incident.   Past or anticipated interventions, if any, per neurosurgery: None  Past or anticipated interventions, if any, per medical oncology:  PRIOR THERAPY:  1) Concurrent chemoradiation with weekly carboplatin for an AUC of 2 and paclitaxel 45 mg per meter squared. Status post 5 cycles. 2) Bronchoscopy, mediastinoscopy, right posterior lateral thoracotomy with right upper lobectomy, lymph node dissection,  and posterior approach to en bloc chest wall resection, ribs 2, 3, and 4, with positive bronchial resection margin. 3) palliative radiotherapy to the best of resection margin under the care of Dr. Tammi Klippel completed on 10/14/2013. 4) systemic chemotherapy with carboplatin for an AUC of 5 given on day 1 and Abraxane 100 mg/m given days 1, 8 and 15 every 3 weeks status post 3 cycles discontinued 12/31/2013 secondary to disease progression. 5) Immunotherapy with Nivolumab 3 mg/kg given every 2 weeks. Status post 20 cycles, discontinued secondary to disease progression.  CURRENT THERAPY: Systemic chemotherapy with single agent  gemcitabine 1000 MG/M2 on days 1 and 8 every 3 weeks is status post 5 cycles. Decision was made to hold any further chemotherapy for the time being and; and to obtain a radiation oncology referral for possible brain radiation. Rescheduled for labs and a follow-up visit with Dr. Julien Nordmann in approximately 3 weeks for further evaluation.  Dose of Decadron, if applicable: No  Recent neurologic symptoms, if any:   Seizures: no  Headaches: yes  Nausea: yes; reports nausea is constant  Dizziness/ataxia: reports feeling light headed when he stands  Difficulty with hand coordination: no  Focal numbness/weakness: no  Visual deficits/changes: no  Confusion/Memory deficits: no  Reports intermittent ringing in his ears  Visible 5 cm lesion top of patient's scalp without redness but, does reports tenderness  Painful bone metastases at present, if any: no  SAFETY ISSUES:  Prior radiation? yes  Pacemaker/ICD? no  Possible current pregnancy? no  Is the patient on methotrexate? no  Additional Complaints / other details: 75 year old male. Chemotherapy stopped. Scheduled to follow up with [facility] on 1/24.

## 2015-04-02 ENCOUNTER — Ambulatory Visit: Payer: Medicare Other | Admitting: Radiation Oncology

## 2015-04-02 ENCOUNTER — Ambulatory Visit: Payer: Medicare Other

## 2015-04-02 ENCOUNTER — Ambulatory Visit: Admission: RE | Admit: 2015-04-02 | Payer: Medicare Other | Source: Ambulatory Visit | Admitting: Radiation Oncology

## 2015-04-02 ENCOUNTER — Ambulatory Visit
Admission: RE | Admit: 2015-04-02 | Discharge: 2015-04-02 | Disposition: A | Discharge status: Home / Self Care | Payer: Medicare Other | Source: Ambulatory Visit | Attending: Radiation Oncology | Admitting: Radiation Oncology

## 2015-04-03 ENCOUNTER — Ambulatory Visit
Admission: RE | Admit: 2015-04-03 | Discharge: 2015-04-03 | Disposition: A | Discharge status: Home / Self Care | Payer: Medicare Other | Source: Ambulatory Visit | Attending: Radiation Oncology | Admitting: Radiation Oncology

## 2015-04-03 VITALS — BP 96/58 | HR 93 | Resp 16 | Ht 74.0 in | Wt 174.6 lb

## 2015-04-03 DIAGNOSIS — Z510 Encounter for antineoplastic radiation therapy: Secondary | ICD-10-CM | POA: Diagnosis present

## 2015-04-03 DIAGNOSIS — C7949 Secondary malignant neoplasm of other parts of nervous system: Secondary | ICD-10-CM | POA: Diagnosis present

## 2015-04-03 DIAGNOSIS — C3412 Malignant neoplasm of upper lobe, left bronchus or lung: Secondary | ICD-10-CM

## 2015-04-03 DIAGNOSIS — C7931 Secondary malignant neoplasm of brain: Secondary | ICD-10-CM

## 2015-04-03 DIAGNOSIS — J439 Emphysema, unspecified: Secondary | ICD-10-CM | POA: Diagnosis not present

## 2015-04-03 DIAGNOSIS — I2510 Atherosclerotic heart disease of native coronary artery without angina pectoris: Secondary | ICD-10-CM | POA: Insufficient documentation

## 2015-04-03 DIAGNOSIS — C3490 Malignant neoplasm of unspecified part of unspecified bronchus or lung: Secondary | ICD-10-CM

## 2015-04-03 NOTE — Progress Notes (Signed)
  Radiation Oncology         [phone] ________________________________  Name: Terry Robles MRN: [id number]  Date: 04/03/2015  DOB: 1940-06-29  SIMULATION AND TREATMENT PLANNING NOTE    ICD-9-CM ICD-10-CM   1. 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  Identity was confirmed.  All relevant records and images related to the planned course of therapy were reviewed.  The patient freely provided informed written consent to proceed with treatment after reviewing the details related to the planned course of therapy. The consent form was witnessed and verified by the simulation staff.  Then, the patient was set-up in a stable reproducible  supine position for radiation therapy.  CT images were obtained.  Surface markings were placed.  The CT images were loaded into the planning software.  Then the target and avoidance structures were contoured.  Treatment planning then occurred.  The radiation prescription was entered and confirmed.  Then, I designed and supervised the construction of a total of 3 medically necessary complex treatment devices, including a custom made thermoplastic mask used for immobilization and three complex multileaf collimators to cover the entire intracranial contents, while shielding the eyes and face.  Each [facility] is independently created to account for beam divergence.  The right and left lateral fields will be treated with 6 MV X-rays.  I have requested : Isodose Plan.    PLAN:  The whole brain will be treated to 35 Gy in 14 fractions.  ________________________________  Sheral Apley Tammi Klippel, M.D.  This document serves as a record of services personally performed by Tyler Pita, MD. It was created on his behalf by Arlyce Harman, a trained medical scribe. The  creation of this record is based on the scribe's personal observations and the provider's statements to them. This document has been checked and approved by the attending provider.

## 2015-04-03 NOTE — Progress Notes (Signed)
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 Secondary malignant neoplasm of brain and spinal cord (HCC) 198.3 C79.31     C79.49   2. Primary malignant neoplasm of left upper lobe of lung (HCC) 162.3 C34.12     Previous Radiation Treatment:  05/20/2013-06/21/2013; The patient's primary tumor and grossly involved lymphadenopathy were treated to 45 Crumpacker in 25 fractions of 1.8 Stayer  Narrative:  The patient returns today for reconsult.  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  He had an MRI on 03/27/15 showing a 4.8 cm left parietal destructive mass with extension in to the scalp and sagittal sinus. He had small 2-3 mm brain metastases without edema.                               ALLERGIES:  has No Known Allergies.  Meds: Current Outpatient Prescriptions  Medication Sig Dispense Refill  . cycloSPORINE (RESTASIS) 0.05 % ophthalmic emulsion 1 drop 2 (two) times daily.    Marland Kitchen docusate sodium (COLACE) 100 MG capsule Take 100 mg by mouth daily.    . feeding supplement, RESOURCE BREEZE, (RESOURCE BREEZE) LIQD Take 1 Container by mouth daily at 3 pm.  0  . HYDROcodone-homatropine (HYCODAN) 5-1.5 MG/5ML syrup Take 5 mLs by mouth every 6 (six) hours as needed for cough. 240 mL 0  . levofloxacin (LEVAQUIN) 500 MG tablet Take 1 tablet (500 mg total) by mouth daily. 10 tablet 0  . Multiple Vitamin (MULITIVITAMIN WITH MINERALS) TABS Take 1 tablet by mouth daily.    Marland Kitchen oxyCODONE-acetaminophen (PERCOCET/ROXICET) 5-325 MG tablet Take 1 tablet by mouth every 6 (six) hours as  needed for severe pain. 90 tablet 0  . polyethylene glycol (MIRALAX / GLYCOLAX) packet Take 17 g by mouth daily.    . prochlorperazine (COMPAZINE) 5 MG tablet Take 1 tablet (5 mg total) by mouth every 6 (six) hours as needed for nausea or vomiting. 30 tablet 2  . temazepam (RESTORIL) 15 MG capsule Take 1 capsule (15 mg total) by mouth at bedtime as needed for sleep. 30 capsule 0  . alfuzosin (UROXATRAL) 10 MG 24 hr tablet Take 10 mg by mouth daily with breakfast. Reported on 04/03/2015    . clobetasol cream (TEMOVATE) 4.09 % Apply 1 application topically 2 (two) times daily. Reported on 04/03/2015    . LORazepam (ATIVAN) 0.5 MG tablet Take 1 to 2 tablets PO prior to MRI as needed for anxiety. (Patient not taking: Reported on 04/03/2015) 2 tablet 0   No current facility-administered medications for this encounter.   Review of Systems: Reports daily Left-sided headaches just above his eye 5-6 on a scale of 0-10 which he manages with Percocet he was prescribed for shoulder pain. Reports he has not fallen. Explains he was screwing in a bolt under the sink in his home and brushed the top of his head on the cabinet. He believe the raised area on his scalp was a result of this incident. Reports  nausea is constant. Reports feeling light headed when he stands. Reports intermittent ringing in his ears. Visible 5 cm lesion top of patient's scalp without redness but, does reports tenderness. Chemotherapy stopped. Scheduled to follow up with [facility] on 1/24.   Physical Findings: The patient is in no acute distress. Patient is alert and oriented.  height is '6\' 2"'$  (1.88 m) and weight is 174 lb 9.6 oz (79.198 kg). His blood pressure is 96/58 and his pulse is 93. His respiration is 16 and oxygen saturation is 100%. .  No significant changes. The patient had a 4 cm area of protuberance on the vertex with intact scalp and no skin breakdown which was non-tender. The patient presents in a wheelchair but is in no acute  distress. Grossly neurologically intact.   Lab Findings: Lab Results  Component Value Date   WBC 7.8 03/31/2015   WBC 5.5 09/23/2014   HGB 11.0* 03/31/2015   HGB 11.2* 09/23/2014   HCT 33.6* 03/31/2015   HCT 34.3* 09/23/2014   PLT 413* 03/31/2015   PLT 258 09/23/2014    Lab Results  Component Value Date   NA 141 03/31/2015   NA 137 09/23/2014   K 4.0 03/31/2015   K 4.1 09/23/2014   CHLORIDE 99 03/31/2015   CO2 33* 03/31/2015   CO2 27 09/23/2014   GLUCOSE 113 03/31/2015   GLUCOSE 119* 09/23/2014   BUN 19.4 03/31/2015   BUN 17 09/23/2014   CREATININE 0.9 03/31/2015   CREATININE 0.94 09/23/2014   BILITOT <0.30 03/31/2015   BILITOT 0.6 09/23/2014   ALKPHOS 161* 03/31/2015   ALKPHOS 124 09/23/2014   AST 16 03/31/2015   AST 14* 09/23/2014   ALT 11 03/31/2015   ALT 13* 09/23/2014   PROT 7.1 03/31/2015   PROT 7.0 09/23/2014   ALBUMIN 2.7* 03/31/2015   ALBUMIN 3.1* 09/23/2014   CALCIUM 10.1 03/31/2015   CALCIUM 8.7* 09/23/2014   ANIONGAP 9 03/31/2015   ANIONGAP 8 09/23/2014    Radiographic Findings: Dg Chest 1 View  03/27/2015  CLINICAL DATA:  Right pleural effusion.  Status post thoracentesis. EXAM: CHEST 1 VIEW COMPARISON:  Chest CT dated 03/05/2015 and chest x-ray dated 04/10/2014 FINDINGS: The patient has undergone right thoracentesis. A large loculated right pleural effusion remains with only a small portion of the right upper lobe being aerated. No pneumothorax. Anterior superior mediastinal mass is noted lying anterior to the aortic arch as demonstrated on the prior CT scan. The left lung is clear.  Port-A-Cath in place. IMPRESSION: No pneumothorax after right thoracentesis. Minimal aeration of the right upper lobe. Large loculated pleural effusion remains. Electronically Signed   By: Lorriane Shire M.D.   On: 03/27/2015 15:32   Ct Head Wo Contrast  03/19/2015  CLINICAL DATA:  Three-week history with nausea. Stage IV lung carcinoma EXAM: CT HEAD WITHOUT CONTRAST  TECHNIQUE: Contiguous axial images were obtained from the base of the skull through the vertex without intravenous contrast. COMPARISON:  Brain MRI October 28, 2013 FINDINGS: There is age related volume loss. There is no intracranial mass, hemorrhage, extra-axial fluid collection, or midline shift. There is slight small vessel disease in the centra semiovale bilaterally. Elsewhere Curet-white compartments appear normal. No acute infarct is evident. There is a lytic lesion arising from the superior left parietal bone near the vertex with overlying soft tissue swelling and soft tissue mass consistent with expansile metastatic focus. The lytic bone lesion measures 3.4 x 2.9 cm in size. The adjacent scalp mass measures 3.0  x 2.5 cm. No other bony metastatic lesions are identified. The mastoid air cells bilaterally are clear. No intraorbital lesions are identified. IMPRESSION: Lytic lesion arising from the superior posterior left parietal bone with surrounding mass and swelling consistent with a metastatic lesion to the bony calvarium and adjacent scalp. No intracranial mass or edema. No intracranial hemorrhage. No extra-axial fluid collection. No evidence of acute infarct. Electronically Signed   By: Lowella Grip III M.D.   On: 03/19/2015 10:59   Ct Chest W Contrast  03/05/2015  CLINICAL DATA:  Lung carcinoma EXAM: CT CHEST WITH CONTRAST TECHNIQUE: Multidetector CT imaging of the chest was performed during intravenous contrast administration. CONTRAST:  24m OMNIPAQUE IOHEXOL 300 MG/ML  SOLN COMPARISON:  January 02, 2015 FINDINGS: Mediastinum/Lymph Nodes: The previously noted mass/adenopathy in the left superior mediastinum/thoracic inlet region currently measures 3.3 x 2.5 cm, essentially unchanged from recent prior study allowing for slight differences in position. No new adenopathy is appreciable on this study. Port-A-Cath tip is in the superior vena cava without demonstrable pneumothorax. Thyroid appears normal.  There is atherosclerotic change in the aorta without aneurysm or dissection. The visualized great vessels appear unremarkable. Note that the right common and left common carotid arteries arise as a common trunk. There is no major vessel pulmonary embolus appreciable. Pericardium is minimally thickened, stable. There are scattered foci of coronary artery calcification. Post radiation therapy change in the right hilum is stable. Lungs/Pleura: There is a sizable right pleural effusion with both free and loculated components, stable. There is a subpulmonic component to this effusion. Volume loss throughout much of the right lung remains with a combination cicatrization and consolidation. A well-defined mass is not appreciable in the right hemithorax. On the left, there is again noted a 6 mm nodular opacity in the anterior segment of the left upper lobe seen on axial slice 28 series 3. A 3 mm nodular opacity in the superior segment of the left lower lobe is seen on axial slice 32 series 3. A 2 mm nodular opacity in the right apex seen on axial slice 13 series 3 is stable. No new parenchymal nodular opacities are noted. There is no consolidation or effusion on the left. There is a degree of underlying emphysematous change which is stable. Upper abdomen: Visualized upper abdominal structures appear unremarkable. Musculoskeletal: There is degenerative change in the thoracic spine. There are no blastic or lytic bone lesions. There is evidence of old rib trauma on the left. IMPRESSION: Mass/enlarged lymph node at the left thoracic inlet/superior mediastinum is essentially stable. Small parenchymal lung nodules, largest measuring 6 mm on the left, are unchanged. There is a degree of underlying emphysema. There is extensive postoperative/ post radiation therapy change on the right with volume loss and consolidation as well as large effusion. These are stable changes. No new lymph node prominence identified. Slight pericardial  thickening is stable. Overall no change from recent prior study. Electronically Signed   By: WLowella GripIII M.D.   On: 03/05/2015 08:30   Mr BJeri CosWYCContrast  03/27/2015  CLINICAL DATA:  75year old male with history of lung cancer post radiation therapy and immunotherapy. On maintenance chemotherapy. Fell 3 weeks ago. Left-sided headache. Subsequent encounter. EXAM: MRI HEAD WITHOUT AND WITH CONTRAST TECHNIQUE: Multiplanar, multiecho pulse sequences of the brain and surrounding structures were obtained without and with intravenous contrast. CONTRAST:  139mMULTIHANCE GADOBENATE DIMEGLUMINE 529 MG/ML IV SOLN COMPARISON:  03/19/2015 CT.  10/28/2013 MR. FINDINGS: Exam is motion degraded. No  acute infarct or intracranial hemorrhage. Left parietal calvarial 4.8 x 4.4 x 2.8 cm destructive mass with extension into the overlying subcutaneous tissue/ scalp as well as extension into the superior left lateral margin of the superior sagittal sinus which is slightly narrowed but patent. Left cerebellar 2.4 mm nodular lesion (series 13, image 10). Questionable left occipital 2 mm lesion (series 15, image 21). Mild atrophy without hydrocephalus. Major intracranial arterial structures are patent. Empty sella incidentally noted. Cervical medullary junction unremarkable with the exception mild transverse ligament hypertrophy. No primary orbital or pineal abnormality. IMPRESSION: Left parietal calvarial 4.8 x 4.4 x 2.8 cm destructive mass with extension into the overlying subcutaneous tissue/ scalp as well as extension into the superior left lateral margin of the superior sagittal sinus which is slightly narrowed but patent. 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 Questionable left occipital 2 mm lesion (series 15, image 21) may represent a metastatic lesion. This is a call report. Electronically Signed   By: Genia Del M.D.   On: 03/27/2015 18:21   US  Thoracentesis Asp Pleural Space W/img Guide  03/27/2015  INDICATION: Symptomatic Right sided pleural effusion Lung Cancer EXAM: US THORACENTESIS ASP PLEURAL SPACE W/IMG GUIDE COMPARISON:  None MEDICATIONS: 10 cc 1% lidocaine COMPLICATIONS: None immediate TECHNIQUE: Informed written consent was obtained from the patient after a discussion of the risks, benefits and alternatives to treatment. A timeout was performed prior to the initiation of the procedure. Initial ultrasound scanning demonstrates a right pleural effusion. The lower chest was prepped and draped in the usual sterile fashion. 1% lidocaine was used for local anesthesia. Under direct ultrasound guidance, a 19 gauge, 7-cm, Yueh catheter was introduced. An ultrasound image was saved for documentation purposes. the thoracentesis was performed. The catheter was removed and a dressing was applied. The patient tolerated the procedure well without immediate post procedural complication. The patient was escorted to have an upright chest radiograph. FINDINGS: A total of approximately 1 liters of blood tinged fluid was removed. IMPRESSION: Successful ultrasound-guided Right sided thoracentesis yielding 1 liters of pleural fluid. Read by:  Lavonia Drafts [facility] Electronically Signed   By: Marybelle Killings M.D.   On: 03/27/2015 15:18    Impression:  Mr. 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 He may benefit from palliative radiation. The patient has a few options for radiation including whole brain radiation. However, because his brain metastases are so small and asymptomatic we would favor treating the skull now and reserving SRS for salvage if the brain metastases progress.  Plan:  Today, I talked to the patient and family about the findings and work-up thus far.  We discussed the natural history of skull metastasis and general treatment, highlighting the role of radiotherapy in  the management.  We discussed the available radiation techniques, and focused on the details of logistics and delivery.  We reviewed the anticipated acute and late sequelae associated with radiation in this setting.  The patient was encouraged to ask questions that I answered to the best of my ability.  I filled out a patient counseling form during our discussion including treatment diagrams.  We retained a copy for our records.  The patient would like to proceed with radiation and will be scheduled for CT simulation.  I spent 60 minutes minutes face to face with the patient and more than 50% of that time  was spent in counseling and/or coordination of care.   _____________________________________  Sheral Apley. Tammi Klippel, M.D.   This document serves as a record of services personally performed by Tyler Pita, MD. It was created on his behalf by Arlyce Harman, a trained medical scribe. The creation of this record is based on the scribe's personal observations and the provider's statements to them. This document has been checked and approved by the attending provider.

## 2015-04-03 NOTE — Progress Notes (Signed)
See progress note under physician encounter. 

## 2015-04-06 DIAGNOSIS — Z510 Encounter for antineoplastic radiation therapy: Secondary | ICD-10-CM | POA: Diagnosis not present

## 2015-04-07 ENCOUNTER — Other Ambulatory Visit: Payer: Medicare Other

## 2015-04-07 ENCOUNTER — Telehealth: Payer: Self-pay | Admitting: Radiation Oncology

## 2015-04-07 ENCOUNTER — Ambulatory Visit
Admission: RE | Admit: 2015-04-07 | Discharge: 2015-04-07 | Disposition: A | Discharge status: Home / Self Care | Payer: Medicare Other | Source: Ambulatory Visit | Attending: Radiation Oncology | Admitting: Radiation Oncology

## 2015-04-07 ENCOUNTER — Ambulatory Visit: Payer: Medicare Other | Admitting: Internal Medicine

## 2015-04-07 ENCOUNTER — Encounter: Payer: Medicare Other | Admitting: Nutrition

## 2015-04-07 ENCOUNTER — Ambulatory Visit: Payer: Medicare Other

## 2015-04-07 DIAGNOSIS — Z510 Encounter for antineoplastic radiation therapy: Secondary | ICD-10-CM | POA: Diagnosis not present

## 2015-04-07 NOTE — Telephone Encounter (Signed)
Patient presented to the clinic today following initial radiation treatment. Patient requesting medication to relieve feelings of claustrophobia associated with treatment mask. Per Shona Simpson, PA-C called in Ativan 0.5 mg tablet to [place], [place].

## 2015-04-08 ENCOUNTER — Ambulatory Visit
Admission: RE | Admit: 2015-04-08 | Discharge: 2015-04-08 | Disposition: A | Discharge status: Home / Self Care | Payer: Medicare Other | Source: Ambulatory Visit | Attending: Radiation Oncology | Admitting: Radiation Oncology

## 2015-04-08 DIAGNOSIS — Z510 Encounter for antineoplastic radiation therapy: Secondary | ICD-10-CM | POA: Diagnosis not present

## 2015-04-09 ENCOUNTER — Ambulatory Visit: Payer: Medicare Other | Admitting: Radiation Oncology

## 2015-04-09 ENCOUNTER — Ambulatory Visit
Admission: RE | Admit: 2015-04-09 | Discharge: 2015-04-09 | Disposition: A | Discharge status: Home / Self Care | Payer: Medicare Other | Source: Ambulatory Visit | Attending: Radiation Oncology | Admitting: Radiation Oncology

## 2015-04-09 DIAGNOSIS — Z510 Encounter for antineoplastic radiation therapy: Secondary | ICD-10-CM | POA: Diagnosis not present

## 2015-04-10 ENCOUNTER — Ambulatory Visit
Admission: RE | Admit: 2015-04-10 | Discharge: 2015-04-10 | Disposition: A | Discharge status: Home / Self Care | Payer: Medicare Other | Source: Ambulatory Visit | Attending: Radiation Oncology | Admitting: Radiation Oncology

## 2015-04-10 ENCOUNTER — Encounter: Payer: Self-pay | Admitting: Radiation Oncology

## 2015-04-10 VITALS — BP 126/82 | HR 93 | Resp 16 | Wt 174.8 lb

## 2015-04-10 DIAGNOSIS — C3412 Malignant neoplasm of upper lobe, left bronchus or lung: Secondary | ICD-10-CM | POA: Diagnosis not present

## 2015-04-10 DIAGNOSIS — C3490 Malignant neoplasm of unspecified part of unspecified bronchus or lung: Secondary | ICD-10-CM | POA: Insufficient documentation

## 2015-04-10 DIAGNOSIS — C3492 Malignant neoplasm of unspecified part of left bronchus or lung: Secondary | ICD-10-CM

## 2015-04-10 DIAGNOSIS — C799 Secondary malignant neoplasm of unspecified site: Secondary | ICD-10-CM

## 2015-04-10 DIAGNOSIS — Z510 Encounter for antineoplastic radiation therapy: Secondary | ICD-10-CM | POA: Diagnosis not present

## 2015-04-10 NOTE — Progress Notes (Signed)
  Radiation Oncology         [phone]   Name: Terry Robles MRN: [id number]   Date: 04/10/2015  DOB: [birth year]   Weekly Radiation Therapy Management  No diagnosis found.  Current Dose: 12 Gy  Planned Dose:  30 Gy  Narrative The patient presents for routine under treatment assessment.  Vitals stable. Reports his appetite has been been ok these last few days. Denies pain or difficulty associated with swallowing. Reports a productive cough with green sputum. Reports intensity and frequency of cough have improved. Reports he slept better these last few nights. No alopecia, hyperpigmentation or desquamation noted within treatment field. Reports scalp is tender to the touch. Reports headaches have resolved since radiation began. Reports nausea is persistent but, manageable. Denies vomiting. Reports dizziness. Denies ringing in the ears. Reports one brief episode of blurry vision while watching TV that quickly resolved.  The patient is without complaint. Set-up films were reviewed. The chart was checked.  Physical Findings  weight is 174 lb 12.8 oz (79.289 kg). His blood pressure is 126/82 and his pulse is 93. His respiration is 16 and oxygen saturation is 100%.  Weight essentially stable.  No significant changes.  Impression The patient is tolerating radiation.  Plan Continue treatment as planned. Pt has been given Sonafine cream if he develops skin irritation during treatment.      The above documentation reflects my direct findings during this shared patient visit. Please see the separate note by Dr. Tammi Klippel on this date for the remainder of the patient's plan of care.  Carola Rhine, PAC

## 2015-04-10 NOTE — Patient Instructions (Signed)
Contact our office if you have any questions following today's appointment: [phone].  

## 2015-04-10 NOTE — Progress Notes (Signed)
Vitals stable. Denies pain. Reports his appetite has been been these last few days. Denies pain or difficulty associated with swallowing. Reports a productive cough with green sputum. Reports intensity and frequency of cough have improved. Reports he slept better these last few nights. No alopecia, hyperpigmentation or desquamation noted within treatment field. Reports scalp is tender to the touch. Reports headaches have resolved. Reports nausea is persistent but, manageable. Denies vomiting. Reports dizziness. Denies ringing in the ears. Reports one brief episode of blurry vision while watching TV that quickly resolved.  BP 126/82 mmHg  Pulse 93  Resp 16  Wt 174 lb 12.8 oz (79.289 kg)  SpO2 100% Wt Readings from Last 3 Encounters:  04/10/15 174 lb 12.8 oz (79.289 kg)  04/03/15 174 lb 9.6 oz (79.198 kg)  03/31/15 174 lb 6.4 oz (79.107 kg)

## 2015-04-13 ENCOUNTER — Ambulatory Visit
Admission: RE | Admit: 2015-04-13 | Discharge: 2015-04-13 | Disposition: A | Discharge status: Home / Self Care | Payer: Medicare Other | Source: Ambulatory Visit | Attending: Radiation Oncology | Admitting: Radiation Oncology

## 2015-04-13 DIAGNOSIS — Z510 Encounter for antineoplastic radiation therapy: Secondary | ICD-10-CM | POA: Diagnosis not present

## 2015-04-14 ENCOUNTER — Ambulatory Visit
Admission: RE | Admit: 2015-04-14 | Discharge: 2015-04-14 | Disposition: A | Discharge status: Home / Self Care | Payer: Medicare Other | Source: Ambulatory Visit | Attending: Radiation Oncology | Admitting: Radiation Oncology

## 2015-04-14 DIAGNOSIS — Z510 Encounter for antineoplastic radiation therapy: Secondary | ICD-10-CM | POA: Diagnosis not present

## 2015-04-14 MED ORDER — SONAFINE EX EMUL
1.0000 "application " | Freq: Two times a day (BID) | CUTANEOUS | Status: AC
  Administered 2015-04-14: 1 via TOPICAL

## 2015-04-14 NOTE — Addendum Note (Signed)
Encounter addended by: Heywood Footman, RN on: 04/14/2015  2:50 PM<BR>     Documentation filed: Notes Section, Chief Complaint Section

## 2015-04-14 NOTE — Progress Notes (Signed)
During 04/10/15 PUT oriented patient to staff and routine of the clinic. Provided patient with RADIATION THERAPY AND YOU handbook then, reviewed pertinent information. Educated patient reference potential side effects and management such as fatigue, alopecia, skin/scalp changes, headache, nausea, and vomiting. Patient verbalized understanding of all reviewed. Answered all patient questions to the best of the my ability. Provided patient with my business card and encouraged him to call with needs.

## 2015-04-14 NOTE — Addendum Note (Signed)
Encounter addended by: Arbutus Ped, [place] on: 04/14/2015 11:34 AM<BR>     Documentation filed: Rx Order Verification

## 2015-04-14 NOTE — Addendum Note (Signed)
Encounter addended by: Heywood Footman, RN on: 04/14/2015 11:28 AM<BR>     Documentation filed: Orders, Dx Association, Inpatient [facility]

## 2015-04-15 ENCOUNTER — Ambulatory Visit
Admission: RE | Admit: 2015-04-15 | Discharge: 2015-04-15 | Disposition: A | Discharge status: Home / Self Care | Payer: Medicare Other | Source: Ambulatory Visit | Attending: Radiation Oncology | Admitting: Radiation Oncology

## 2015-04-15 DIAGNOSIS — Z510 Encounter for antineoplastic radiation therapy: Secondary | ICD-10-CM | POA: Diagnosis not present

## 2015-04-16 ENCOUNTER — Encounter: Payer: Self-pay | Admitting: Radiation Oncology

## 2015-04-16 ENCOUNTER — Ambulatory Visit
Admission: RE | Admit: 2015-04-16 | Discharge: 2015-04-16 | Disposition: A | Discharge status: Home / Self Care | Payer: Medicare Other | Source: Ambulatory Visit | Attending: Radiation Oncology | Admitting: Radiation Oncology

## 2015-04-16 VITALS — BP 138/86 | HR 83 | Resp 16 | Wt 171.6 lb

## 2015-04-16 DIAGNOSIS — Z510 Encounter for antineoplastic radiation therapy: Secondary | ICD-10-CM | POA: Diagnosis not present

## 2015-04-16 DIAGNOSIS — C799 Secondary malignant neoplasm of unspecified site: Principal | ICD-10-CM

## 2015-04-16 DIAGNOSIS — C3490 Malignant neoplasm of unspecified part of unspecified bronchus or lung: Secondary | ICD-10-CM

## 2015-04-16 NOTE — Progress Notes (Addendum)
Vitals stable. Three pound weight loss noted. Reports difficulty sleeping despite take Restoril. Reports lack of desire to eat thus, weight loss. Denies headaches excepts for this past Sunday. Reports nausea and vomiting. Reports dizziness with standing. Denies diplopia or ringing in his ears. No alopecia noted within treatment field. No hyperpigmentation or desquamation either. Reports fatigue. One month follow up appointment card given.  BP 138/86 mmHg  Pulse 83  Resp 16  Wt 171 lb 9.6 oz (77.837 kg)  SpO2 100% Wt Readings from Last 3 Encounters:  04/16/15 171 lb 9.6 oz (77.837 kg)  04/10/15 174 lb 12.8 oz (79.289 kg)  04/03/15 174 lb 9.6 oz (79.198 kg)

## 2015-04-16 NOTE — Progress Notes (Signed)
  Radiation Oncology         [id number]   Name: Terry Robles MRN: [id number]   Date: 04/16/2015  DOB: Sep 05, 1940   Weekly Radiation Therapy Management    ICD-9-CM ICD-10-CM   1. 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  Vitals stable. Three pound weight loss noted. Reports difficulty sleeping despite take Restoril. Reports lack of desire to eat thus, weight loss. Denies headaches excepts for this past Sunday. Reports nausea and vomiting. Reports dizziness with standing. Denies diplopia or ringing in his ears. No alopecia noted within treatment field. No hyperpigmentation or desquamation either. Reports fatigue. One month follow up appointment card given.   The patient is without complaint. Set-up films were reviewed. The chart was checked.  Physical Findings  weight is 171 lb 9.6 oz (77.837 kg). His blood pressure is 138/86 and his pulse is 83. His respiration is 16 and oxygen saturation is 100%. . Weight essentially stable. Hair is still intact and the mass on the top of the skull has not changed much. Feels slightly soft. No significant changes.  Impression The patient is tolerating radiation.  Plan Continue treatment as planned. Will follow up 1 month after treatment. Follow-up with MRI to consider salvage SRS in the future.       Sheral Apley Tammi Klippel, M.D.  This document serves as a record of services personally performed by Tyler Pita, MD. It was created on his behalf by Jenell Milliner, a trained medical scribe. The creation of this record is based on the scribe's personal observations and the provider's statements to them. This document has been checked and approved by the attending provider.

## 2015-04-17 ENCOUNTER — Ambulatory Visit
Admission: RE | Admit: 2015-04-17 | Discharge: 2015-04-17 | Disposition: A | Discharge status: Home / Self Care | Payer: Medicare Other | Source: Ambulatory Visit | Attending: Radiation Oncology | Admitting: Radiation Oncology

## 2015-04-17 DIAGNOSIS — Z510 Encounter for antineoplastic radiation therapy: Secondary | ICD-10-CM | POA: Diagnosis not present

## 2015-04-20 ENCOUNTER — Encounter: Payer: Self-pay | Admitting: Radiation Oncology

## 2015-04-20 ENCOUNTER — Ambulatory Visit
Admission: RE | Admit: 2015-04-20 | Discharge: 2015-04-20 | Disposition: A | Discharge status: Home / Self Care | Payer: Medicare Other | Source: Ambulatory Visit | Attending: Radiation Oncology | Admitting: Radiation Oncology

## 2015-04-20 ENCOUNTER — Ambulatory Visit: Payer: Medicare Other

## 2015-04-20 DIAGNOSIS — Z510 Encounter for antineoplastic radiation therapy: Secondary | ICD-10-CM | POA: Diagnosis not present

## 2015-04-21 ENCOUNTER — Ambulatory Visit: Payer: Medicare Other

## 2015-04-21 ENCOUNTER — Telehealth: Payer: Self-pay | Admitting: Medical Oncology

## 2015-04-21 ENCOUNTER — Encounter: Payer: Self-pay | Admitting: Internal Medicine

## 2015-04-21 ENCOUNTER — Ambulatory Visit (HOSPITAL_BASED_OUTPATIENT_CLINIC_OR_DEPARTMENT_OTHER): Payer: Medicare Other | Admitting: Internal Medicine

## 2015-04-21 ENCOUNTER — Other Ambulatory Visit (HOSPITAL_BASED_OUTPATIENT_CLINIC_OR_DEPARTMENT_OTHER): Payer: Medicare Other

## 2015-04-21 VITALS — BP 93/61 | HR 93 | Temp 98.5°F | Resp 18 | Ht 74.0 in | Wt 169.4 lb

## 2015-04-21 DIAGNOSIS — C3411 Malignant neoplasm of upper lobe, right bronchus or lung: Secondary | ICD-10-CM

## 2015-04-21 DIAGNOSIS — Z5112 Encounter for antineoplastic immunotherapy: Secondary | ICD-10-CM

## 2015-04-21 DIAGNOSIS — R530 Neoplastic (malignant) related fatigue: Secondary | ICD-10-CM

## 2015-04-21 DIAGNOSIS — C7951 Secondary malignant neoplasm of bone: Secondary | ICD-10-CM

## 2015-04-21 DIAGNOSIS — R079 Chest pain, unspecified: Secondary | ICD-10-CM

## 2015-04-21 DIAGNOSIS — G4700 Insomnia, unspecified: Secondary | ICD-10-CM

## 2015-04-21 DIAGNOSIS — C7931 Secondary malignant neoplasm of brain: Secondary | ICD-10-CM | POA: Diagnosis not present

## 2015-04-21 HISTORY — DX: Insomnia, unspecified: G47.00

## 2015-04-21 LAB — COMPREHENSIVE METABOLIC PANEL
ALBUMIN: 2.7 g/dL — AB (ref 3.5–5.0)
ALK PHOS: 156 U/L — AB (ref 40–150)
ALT: <9 U/L (ref 0–55)
AST: 12 U/L (ref 5–34)
Anion Gap: 8 mEq/L (ref 3–11)
BUN: 13.3 mg/dL (ref 7.0–26.0)
CALCIUM: 10.5 mg/dL — AB (ref 8.4–10.4)
CO2: 29 mEq/L (ref 22–29)
Chloride: 102 mEq/L (ref 98–109)
Creatinine: 0.9 mg/dL (ref 0.7–1.3)
EGFR: 84 mL/min/{1.73_m2} — AB (ref >90)
Glucose: 120 mg/dl (ref 70–140)
POTASSIUM: 4.4 meq/L (ref 3.5–5.1)
Sodium: 140 mEq/L (ref 136–145)
Total Bilirubin: 0.34 mg/dL (ref 0.20–1.20)
Total Protein: 7.4 g/dL (ref 6.4–8.3)

## 2015-04-21 LAB — CBC WITH DIFFERENTIAL/PLATELET
BASO%: 0.1 % (ref 0.0–2.0)
BASOS ABS: 0 10*3/uL (ref 0.0–0.1)
EOS ABS: 0.1 10*3/uL (ref 0.0–0.5)
EOS%: 1.1 % (ref 0.0–7.0)
HEMATOCRIT: 34 % — AB (ref 38.4–49.9)
HEMOGLOBIN: 10.7 g/dL — AB (ref 13.0–17.1)
LYMPH#: 1.1 10*3/uL (ref 0.9–3.3)
LYMPH%: 13.1 % — ABNORMAL LOW (ref 14.0–49.0)
MCH: 31.8 pg (ref 27.2–33.4)
MCHC: 31.5 g/dL — ABNORMAL LOW (ref 32.0–36.0)
MCV: 101.2 fL — AB (ref 79.3–98.0)
MONO#: 0.6 10*3/uL (ref 0.1–0.9)
MONO%: 6.9 % (ref 0.0–14.0)
NEUT#: 6.3 10*3/uL (ref 1.5–6.5)
NEUT%: 78.8 % — ABNORMAL HIGH (ref 39.0–75.0)
PLATELETS: 278 10*3/uL (ref 140–400)
RBC: 3.36 10*6/uL — ABNORMAL LOW (ref 4.20–5.82)
RDW: 14.9 % — AB (ref 11.0–14.6)
WBC: 8 10*3/uL (ref 4.0–10.3)

## 2015-04-21 MED ORDER — OXYCODONE-ACETAMINOPHEN 5-325 MG PO TABS: 1.0000 | ORAL_TABLET | Freq: Four times a day (QID) | ORAL | Status: AC | PRN

## 2015-04-21 MED ORDER — TEMAZEPAM 30 MG PO CAPS: 30.0000 mg | ORAL_CAPSULE | Freq: Every evening | ORAL | Status: DC | PRN

## 2015-04-21 NOTE — Progress Notes (Signed)
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  Primary site: Lung (Right)  Staging method: AJCC 7th Edition  Clinical: Stage IIIA (T3, N2, M0) signed by Curt Bears, MD on 05/09/2013 2:56 PM  Summary: Stage IIIA (T3, N2, M0)  PRIOR THERAPY:  1) Concurrent chemoradiation with weekly carboplatin for an AUC of 2 and paclitaxel 45 mg per meter squared. Status post 5 cycles. 2) Bronchoscopy, mediastinoscopy, right posterior lateral thoracotomy with right upper lobectomy, lymph node dissection,  and posterior approach to en bloc chest wall resection, ribs 2, 3, and 4, with positive bronchial resection margin. 3) palliative radiotherapy to the best of resection margin under the care of Dr. Tammi Klippel completed on 10/14/2013. 4) systemic chemotherapy with carboplatin for an AUC of 5 given on day 1 and Abraxane 100 mg/m given days 1, 8 and 15 every 3 weeks status post 3 cycles discontinued 12/31/2013 secondary to disease progression. 5) Immunotherapy with Nivolumab 3 mg/kg given every 2 weeks. Status post 20 cycles, discontinued secondary to disease progression. 6) Systemic chemotherapy with single agent gemcitabine 1000 MG/M2 on days 1 and 8 every 3 weeks is status post 7 cycles. Last dose was given 03/17/2015, Discontinued secondary to disease progression. 7) palliative radiotherapy to the metastatic lesion in the skull with invasion to the brain under the care of Dr. Tammi Klippel last fraction was given today.   CURRENT THERAPY: Palliative care and hospice.  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   INTERVAL HISTORY: Terry Robles 75 y.o. male returns to the clinic today for follow-up visit. The patient completed 7 cycles of his systemic chemotherapy with single agent gemcitabine but unfortunately was found to have evidence for disease progression in the skull with invasion into the brain as well as progressive right pleural effusion status post ultrasound-guided right thoracentesis with drainage of 1 L of blood tinged fluid. He also recently completed a course of palliative radiotherapy to the metastatic lesion in the skull with brain invasion. Has some insomnia and requesting adjustment of his treatment with Restoril. He has no significant nausea or vomiting. He has no fever or chills. He denied having any significant shortness of breath, cough or hemoptysis but has occasional right-sided chest pain. He has no significant weight loss or night sweats. He is here today for reevaluation and discussion of his treatment options.  MEDICAL HISTORY: Past Medical History  Diagnosis Date  . Hypercholesteremia 05/14/2011  . BPH (benign prostatic hyperplasia) 05/14/2011    takes Uroxatral daily  . GERD (gastroesophageal reflux disease)   . Shortness of breath     weather related  . Pneumonia  1965  . Headache(784.0)     occasionally  . Joint pain   . Maintenance chemotherapy   . Urinary frequency   . History of shingles   . Anemia   . Acquired  thrombocytopenia   . 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 Lung cancer ([facility])     right lung  . Cancer ([facility])     squamous cell carcinoma right superior sulcus    ALLERGIES:  has No Known Allergies.  MEDICATIONS:  Current Outpatient Prescriptions  Medication Sig Dispense Refill  . alfuzosin (UROXATRAL) 10 MG 24 hr tablet Take 10 mg by mouth daily with breakfast. Reported on 04/03/2015    . clobetasol cream (TEMOVATE) 5.18 % Apply 1 application topically 2 (two) times daily. Reported on 04/03/2015    . cycloSPORINE (RESTASIS) 0.05 % ophthalmic emulsion 1 drop 2 (two) times daily.    Marland Kitchen docusate sodium (COLACE) 100 MG capsule Take 100 mg by mouth daily.    . feeding supplement, RESOURCE BREEZE, (RESOURCE BREEZE) LIQD Take 1 Container by mouth daily at 3 pm.  0  . HYDROcodone-homatropine (HYCODAN) 5-1.5 MG/5ML syrup Take 5 mLs by mouth every 6 (six) hours as needed for cough. 240 mL 0  . LORazepam (ATIVAN) 0.5 MG tablet Take 0.5 mg by mouth as directed. Take one tablet by mouth thirty minutes prior to radiation treatment or prn anxiety. Qty:20 No refills. Called into [facility], [place].    . Multiple Vitamin (MULITIVITAMIN WITH MINERALS) TABS Take 1 tablet by mouth daily.    Marland Kitchen oxyCODONE-acetaminophen (PERCOCET/ROXICET) 5-325 MG tablet Take 1 tablet by mouth every 6 (six) hours as needed for severe pain. 90 tablet 0  . polyethylene glycol (MIRALAX / GLYCOLAX) packet Take 17 g by mouth daily.    . prochlorperazine (COMPAZINE) 5 MG tablet Take 1 tablet (5 mg total) by mouth every 6 (six) hours as needed for nausea or vomiting. 30 tablet 2  . temazepam (RESTORIL) 15 MG capsule Take 1 capsule (15 mg total) by mouth at bedtime as needed for sleep. 30 capsule 0   No current facility-administered medications for this visit.   Facility-Administered Medications Ordered in Other Visits  Medication Dose Route Frequency Provider Last Rate Last Dose  . SONAFINE emulsion 1 application  1  application Topical BID Tyler Pita, MD   1 application at 84/16/60 1127    SURGICAL HISTORY:  Past Surgical History  Procedure Laterality Date  . Other surgical history      benign tumor removed from left leg several years ago  . Other surgical history      had melanoma/squamous cell removed previously per patient  . Tumor excision      Left knee  . Colonoscopy w/ biopsies and polypectomy    . Video bronchoscopy N/A 07/30/2013    Procedure: VIDEO BRONCHOSCOPY;  Surgeon: Grace Isaac, MD;  Location: [facility];  Service: Thoracic;  Laterality: N/A;  . Mediastinoscopy N/A 07/30/2013    Procedure: MEDIASTINOSCOPY;  Surgeon: Grace Isaac, MD;  Location: [facility];  Service: Thoracic;  Laterality: N/A;  . Video assisted thoracoscopy (vats)/wedge resection Right 07/30/2013    Procedure: VIDEO ASSISTED THORACOSCOPY;  Surgeon: Grace Isaac, MD;  Location: [facility];  Service: Thoracic;  Laterality: Right;  . Thoracotomy  07/30/2013    Procedure: THORACOTOMY FOR CHEST WALL RESECTION, RIGHT UPPER LOBECTOMY;  Surgeon: Grace Isaac, MD;  Location: [facility];  Service: Thoracic;;  . Adenoidectomy  as  a child  . Tonsillectomy    . Portacath placement Left 10/30/2013    Procedure: INSERTION PORT-A-CATH WITH ULTRASOUND GUIDANCE AND FLUORO;  Surgeon: Grace Isaac, MD;  Location: [facility];  Service: Thoracic;  Laterality: Left;    REVIEW OF SYSTEMS:  A comprehensive review of systems was negative except for: Constitutional: positive for fatigue and weight loss Respiratory: positive for dyspnea on exertion and pleurisy/chest pain Musculoskeletal: positive for muscle weakness Behavioral/Psych: positive for sleep disturbance   PHYSICAL EXAMINATION: General appearance: alert, cooperative, fatigued and no distress Head: Normocephalic, without obvious abnormality, atraumatic Neck: no adenopathy, no JVD, supple, symmetrical, trachea midline and thyroid not enlarged, symmetric, no  tenderness/mass/nodules Lymph nodes: Cervical, supraclavicular, and axillary nodes normal. Resp: diminished breath sounds RLL Back: symmetric, no curvature. ROM normal. No CVA tenderness. Cardio: regular rate and rhythm, S1, S2 normal, no murmur, click, rub or gallop GI: soft, non-tender; bowel sounds normal; no masses,  no organomegaly Extremities: extremities normal, atraumatic, no cyanosis or edema Neurologic: Alert and oriented X 3, normal strength and tone. Normal symmetric reflexes. Normal coordination and gait  ECOG PERFORMANCE STATUS: 1 - Symptomatic but completely ambulatory  There were no vitals taken for this visit.  LABORATORY DATA: Lab Results  Component Value Date   WBC 8.0 04/21/2015   HGB 10.7* 04/21/2015   HCT 34.0* 04/21/2015   MCV 101.2* 04/21/2015   PLT 278 04/21/2015      Chemistry      Component Value Date/Time   NA 141 03/31/2015 1350   NA 137 09/23/2014 1245   K 4.0 03/31/2015 1350   K 4.1 09/23/2014 1245   CL 102 09/23/2014 1245   CO2 33* 03/31/2015 1350   CO2 27 09/23/2014 1245   BUN 19.4 03/31/2015 1350   BUN 17 09/23/2014 1245   CREATININE 0.9 03/31/2015 1350   CREATININE 0.94 09/23/2014 1245      Component Value Date/Time   CALCIUM 10.1 03/31/2015 1350   CALCIUM 8.7* 09/23/2014 1245   ALKPHOS 161* 03/31/2015 1350   ALKPHOS 124 09/23/2014 1245   AST 16 03/31/2015 1350   AST 14* 09/23/2014 1245   ALT 11 03/31/2015 1350   ALT 13* 09/23/2014 1245   BILITOT <0.30 03/31/2015 1350   BILITOT 0.6 09/23/2014 1245       RADIOGRAPHIC STUDIES: Dg Chest 1 View  03/27/2015  CLINICAL DATA:  Right pleural effusion.  Status post thoracentesis. EXAM: CHEST 1 VIEW COMPARISON:  Chest CT dated 03/05/2015 and chest x-ray dated 04/10/2014 FINDINGS: The patient has undergone right thoracentesis. A large loculated right pleural effusion remains with only a small portion of the right upper lobe being aerated. No pneumothorax. Anterior superior mediastinal  mass is noted lying anterior to the aortic arch as demonstrated on the prior CT scan. The left lung is clear.  Port-A-Cath in place. IMPRESSION: No pneumothorax after right thoracentesis. Minimal aeration of the right upper lobe. Large loculated pleural effusion remains. Electronically Signed   By: Lorriane Shire M.D.   On: 03/27/2015 15:32   Mr Jeri Cos JJ Contrast  03/27/2015  CLINICAL DATA:  75 year old male with history of lung cancer post radiation therapy and immunotherapy. On maintenance chemotherapy. Fell 3 weeks ago. Left-sided headache. Subsequent encounter. EXAM: MRI HEAD WITHOUT AND WITH CONTRAST TECHNIQUE: Multiplanar, multiecho pulse sequences of the brain and surrounding structures were obtained without and with intravenous contrast. CONTRAST:  57m MULTIHANCE GADOBENATE DIMEGLUMINE 529 MG/ML IV SOLN COMPARISON:  03/19/2015 CT.  10/28/2013 MR. FINDINGS: Exam is  motion degraded. No acute infarct or intracranial hemorrhage. Left parietal calvarial 4.8 x 4.4 x 2.8 cm destructive mass with extension into the overlying subcutaneous tissue/ scalp as well as extension into the superior left lateral margin of the superior sagittal sinus which is slightly narrowed but patent. Left cerebellar 2.4 mm nodular lesion (series 13, image 10). Questionable left occipital 2 mm lesion (series 15, image 21). Mild atrophy without hydrocephalus. Major intracranial arterial structures are patent. Empty sella incidentally noted. Cervical medullary junction unremarkable with the exception mild transverse ligament hypertrophy. No primary orbital or pineal abnormality. IMPRESSION: Left parietal calvarial 4.8 x 4.4 x 2.8 cm destructive mass with extension into the overlying subcutaneous tissue/ scalp as well as extension into the superior left lateral margin of the superior sagittal sinus which is slightly narrowed but patent. 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  Questionable left occipital 2 mm lesion (series 15, image 21) may represent a metastatic lesion. This is a call report. Electronically Signed   By: Genia Del M.D.   On: 03/27/2015 18:21   US Thoracentesis Asp Pleural Space W/img Guide  03/27/2015  INDICATION: Symptomatic Right sided pleural effusion Lung Cancer EXAM: US THORACENTESIS ASP PLEURAL SPACE W/IMG GUIDE COMPARISON:  None MEDICATIONS: 10 cc 1% lidocaine COMPLICATIONS: None immediate TECHNIQUE: Informed written consent was obtained from the patient after a discussion of the risks, benefits and alternatives to treatment. A timeout was performed prior to the initiation of the procedure. Initial ultrasound scanning demonstrates a right pleural effusion. The lower chest was prepped and draped in the usual sterile fashion. 1% lidocaine was used for local anesthesia. Under direct ultrasound guidance, a 19 gauge, 7-cm, Yueh catheter was introduced. An ultrasound image was saved for documentation purposes. the thoracentesis was performed. The catheter was removed and a dressing was applied. The patient tolerated the procedure well without immediate post procedural complication. The patient was escorted to have an upright chest radiograph. FINDINGS: A total of approximately 1 liters of blood tinged fluid was removed. IMPRESSION: Successful ultrasound-guided Right sided thoracentesis yielding 1 liters of pleural fluid. Read by:  Lavonia Drafts [facility] Electronically Signed   By: Marybelle Killings M.D.   On: 03/27/2015 15:18    ASSESSMENT AND PLAN: This is a very pleasant 75 years old white male with metastatic non-small cell lung cancer, squamous cell carcinoma. He is status post several chemotherapy regimens including treatment with immunotherapy with Nivolumab status post 20 cycles and tolerating his treatment fairly well but this was discontinued secondary to disease progression. He is currently undergoing systemic chemotherapy with single agent gemcitabine  status post 7 cycles and is tolerating his treatment fairly well. Unfortunately has evidence for disease progression. He underwent ultrasound-guided right-sided thoracentesis as well as palliative radiotherapy to the metastatic lesion in the skull with brain invasion. I had another lengthy discussion with the patient today about his current disease status and treatment options. I gave him the option of consideration of systemic chemotherapy with single agent docetaxel or Navelbine versus palliative care and hospice referral. The patient is interested in considering the palliative care at this point. We will call the hospice service of [facility] to see the patient. For pain management, he was given a refill of Percocet. For insomnia, I will increase his dose of Restoril to 30 mg by mouth daily at bedtime. He was advised to call immediately if he has any concerning symptoms in the interval. The patient voices understanding  of current disease status and treatment options and is in agreement with the current care plan.  All questions were answered. The patient knows to call the clinic with any problems, questions or concerns. We can certainly see the patient much sooner if necessary.  Disclaimer: This note was dictated with voice recognition software. Similar sounding words can inadvertently be transcribed and may not be corrected upon review.

## 2015-04-21 NOTE — Telephone Encounter (Signed)
Referred to [facility].

## 2015-04-22 ENCOUNTER — Ambulatory Visit: Payer: Medicare Other

## 2015-04-23 ENCOUNTER — Telehealth: Payer: Self-pay | Admitting: *Deleted

## 2015-04-23 ENCOUNTER — Encounter: Payer: Self-pay | Admitting: Internal Medicine

## 2015-04-23 DIAGNOSIS — R530 Neoplastic (malignant) related fatigue: Secondary | ICD-10-CM

## 2015-04-23 DIAGNOSIS — R079 Chest pain, unspecified: Secondary | ICD-10-CM

## 2015-04-23 DIAGNOSIS — C3411 Malignant neoplasm of upper lobe, right bronchus or lung: Secondary | ICD-10-CM

## 2015-04-23 DIAGNOSIS — Z5112 Encounter for antineoplastic immunotherapy: Secondary | ICD-10-CM

## 2015-04-23 MED ORDER — HYDROCODONE-HOMATROPINE 5-1.5 MG/5ML PO SYRP: 5.0000 mL | ORAL_SOLUTION | Freq: Four times a day (QID) | ORAL | Status: AC | PRN

## 2015-04-23 MED ORDER — PROCHLORPERAZINE MALEATE 5 MG PO TABS: 5.0000 mg | ORAL_TABLET | Freq: Four times a day (QID) | ORAL | Status: AC | PRN

## 2015-04-23 MED ORDER — TEMAZEPAM 30 MG PO CAPS: 30.0000 mg | ORAL_CAPSULE | Freq: Every evening | ORAL | Status: AC | PRN

## 2015-04-23 NOTE — Telephone Encounter (Signed)
VM message received from patient regarding recent refills that his pharmacy said they did not receive. TCT patient and he states the prescriptions for Remeron 30 mg, Hycodan cough syrup and Compazine were not called in in according to his [facility] in [place].  Stanton Kidney, RN with Dr. Julien Nordmann made aware.

## 2015-04-23 NOTE — Telephone Encounter (Signed)
Pt request refill on Restoril, Compazine and hycodan. Reviewed with MD ok to refill. Pt notified. Called [facility] [place], confirmed Restoril rx refill was not received. Pt notified medications were ready for pick up and compazine has been called in. Pt voiced understanding

## 2015-04-27 ENCOUNTER — Other Ambulatory Visit: Payer: Self-pay | Admitting: Medical Oncology

## 2015-04-27 ENCOUNTER — Other Ambulatory Visit: Payer: Self-pay | Admitting: Internal Medicine

## 2015-04-27 ENCOUNTER — Telehealth: Payer: Self-pay | Admitting: Medical Oncology

## 2015-04-27 NOTE — Telephone Encounter (Signed)
Requesting appetite stimulant . Hospice Standing orders are for prednisone 20 mg qd.Can pt have this? Note to mohamed.

## 2015-04-27 NOTE — Telephone Encounter (Signed)
Per Julien Nordmann okay for prednisone 20 mg daily. Called to [facility].

## 2015-05-07 ENCOUNTER — Other Ambulatory Visit: Payer: Self-pay | Admitting: Internal Medicine

## 2015-05-08 NOTE — Progress Notes (Signed)
  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  Narrative: The patient tolerated radiation treatment relatively well.   Three pound weight loss noted. Reports difficulty sleeping despite take Restoril. Reports lack of desire to eat thus, weight loss. Denies headaches excepts for this past Sunday. Reports nausea and vomiting. Reports dizziness with standing. Denies diplopia or ringing in his ears. No alopecia noted within treatment field. No hyperpigmentation or desquamation either. Reports fatigue.  Plan: The patient has completed radiation treatment. The patient will return to radiation oncology clinic for routine followup in one month. I advised him to call or return sooner if he has any questions or concerns related to his recovery or treatment. ________________________________  Sheral Apley. Tammi Klippel, M.D.  This document serves as a record of services personally performed by Tyler Pita, MD. It was created on his behalf by Arlyce Harman, a trained medical scribe. The creation of this record is based on the scribe's personal observations and the provider's statements to them. This document has been checked and approved by the attending provider.

## 2015-05-18 ENCOUNTER — Other Ambulatory Visit: Payer: Self-pay | Admitting: Internal Medicine

## 2015-05-18 DIAGNOSIS — C3412 Malignant neoplasm of upper lobe, left bronchus or lung: Secondary | ICD-10-CM

## 2015-05-18 MED ORDER — AZITHROMYCIN 250 MG PO TABS: ORAL_TABLET | ORAL | Status: AC

## 2015-05-19 ENCOUNTER — Telehealth: Payer: Self-pay | Admitting: Medical Oncology

## 2015-05-19 NOTE — Telephone Encounter (Signed)
Terry Robles reported yesterday pt had green thick sputum . Note to [place].

## 2015-05-21 ENCOUNTER — Ambulatory Visit: Payer: Self-pay | Admitting: Radiation Oncology

## 2015-05-28 ENCOUNTER — Telehealth: Payer: Self-pay | Admitting: *Deleted

## 2015-05-28 NOTE — Telephone Encounter (Signed)
Hospice RN called, pt expired this am 06/26/2015 @  0745.

## 2015-06-27 DEATH — deceased

## 2015-09-29 ENCOUNTER — Other Ambulatory Visit: Payer: Self-pay | Admitting: Nurse Practitioner

## 2017-06-17 IMAGING — CT CT CHEST W/ CM
2 of 3 series · 15 of 36 positions shown, 18 images · IV contrast (omnipaque)
Comparison: January 02, 2015

CLINICAL DATA: Lung carcinoma

EXAM:
CT CHEST WITH CONTRAST
TECHNIQUE: Multidetector CT imaging of the chest was performed during
intravenous contrast administration.
CONTRAST:  75mL OMNIPAQUE IOHEXOL 300 MG/ML  SOLN

[Series 2: chestroutine 5.0 b40f · axial · 0.73mm/px · z∈[-309,-29]mm · 12 of 66 slices shown, 15 images]
[im 5/66  mediastinal]
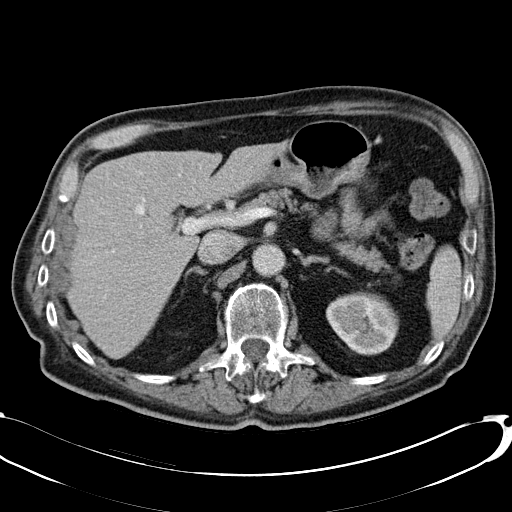
[im 5/66  lung]
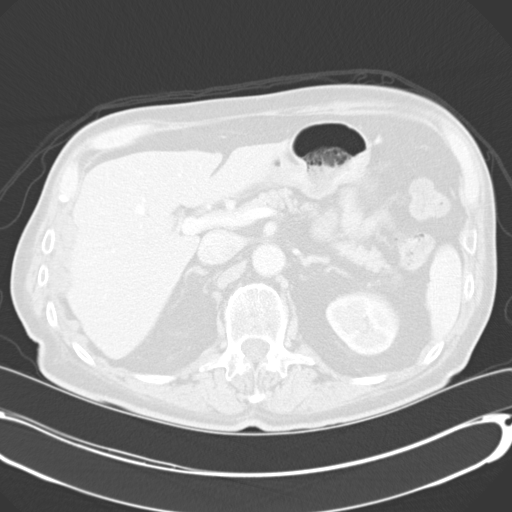
[im 10/66  lung]
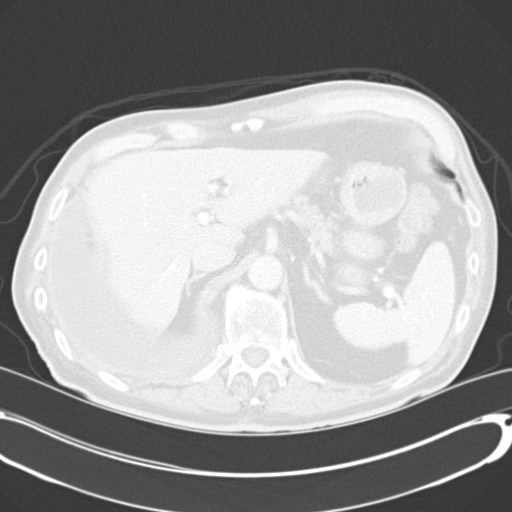
[im 15/66  lung]
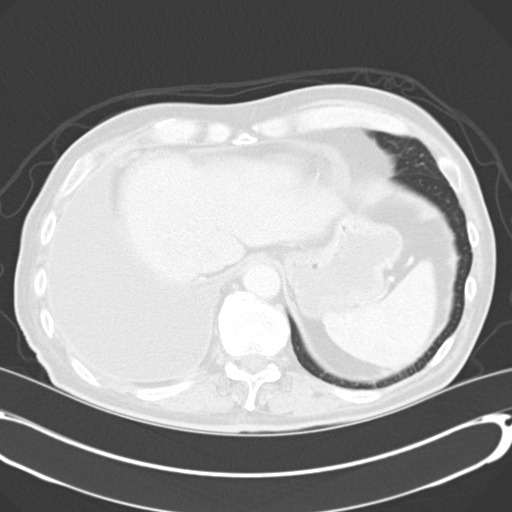
[im 20/66  lung]
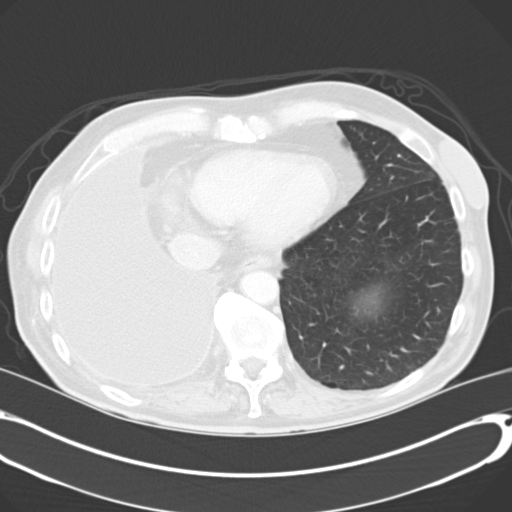
[im 25/66  mediastinal]
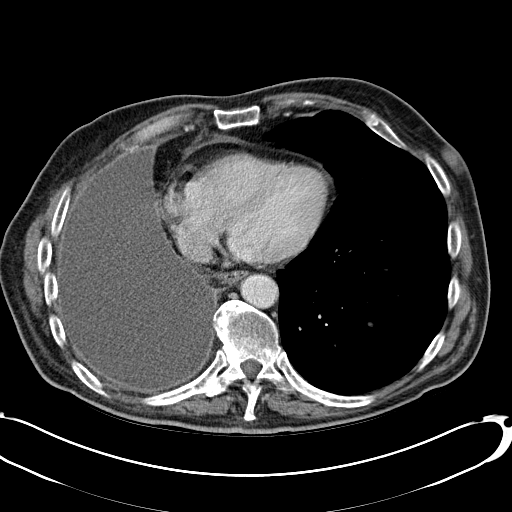
[im 25/66  lung]
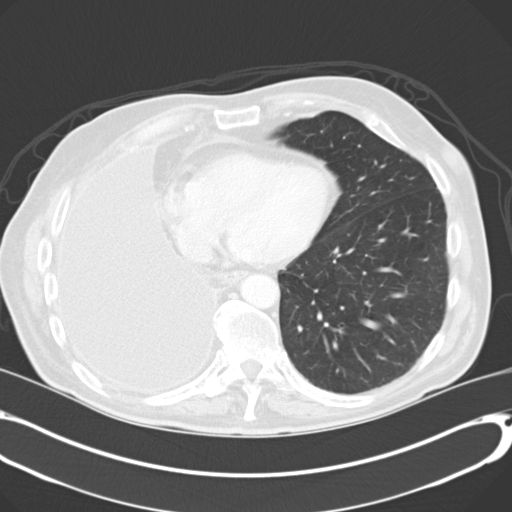
[im 29/66  lung]
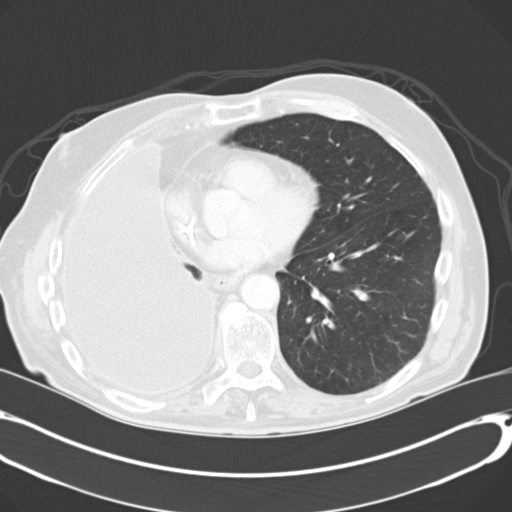
[im 37/66  lung]
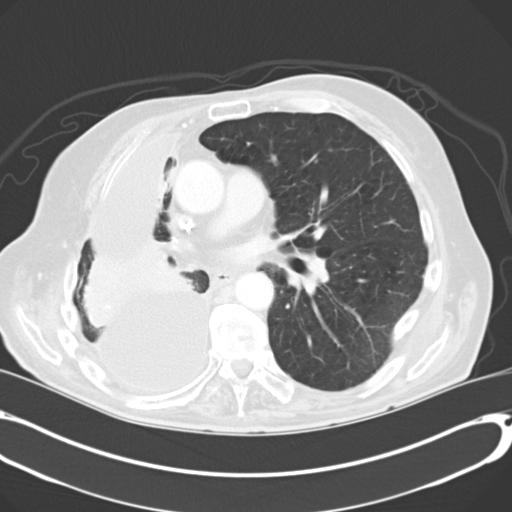
[im 41/66  lung]
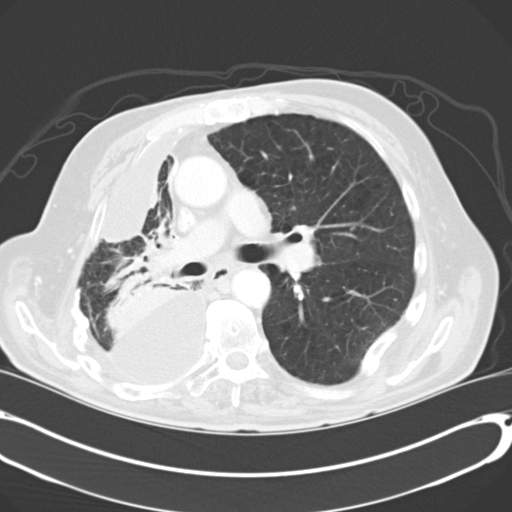
[im 46/66  mediastinal]
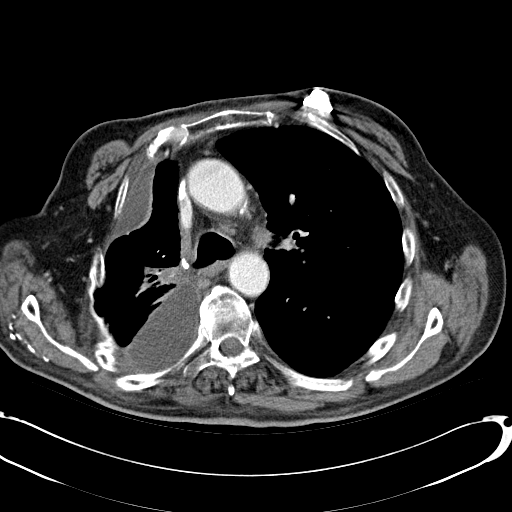
[im 46/66  lung]
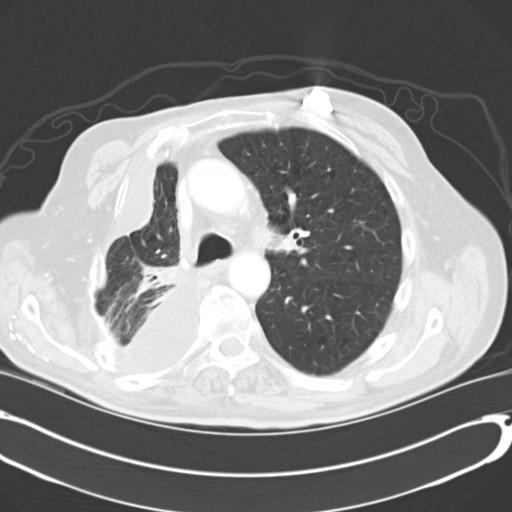
[im 51/66  lung]
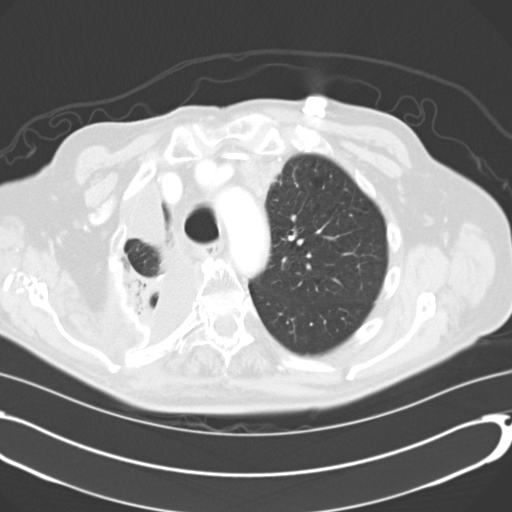
[im 56/66  lung]
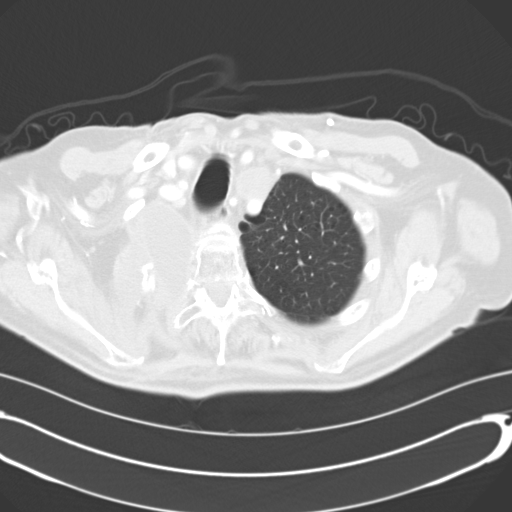
[im 61/66  lung]
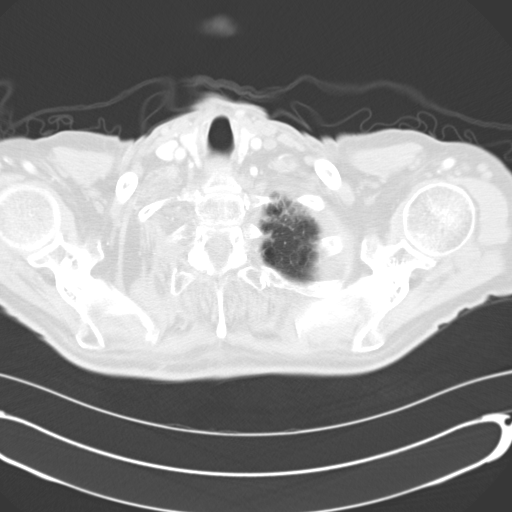

[Series 4: mpr coronal chest 3mm · coronal · 0.66mm/px · 3 of 88 slices shown]
[im 18/88  lung]
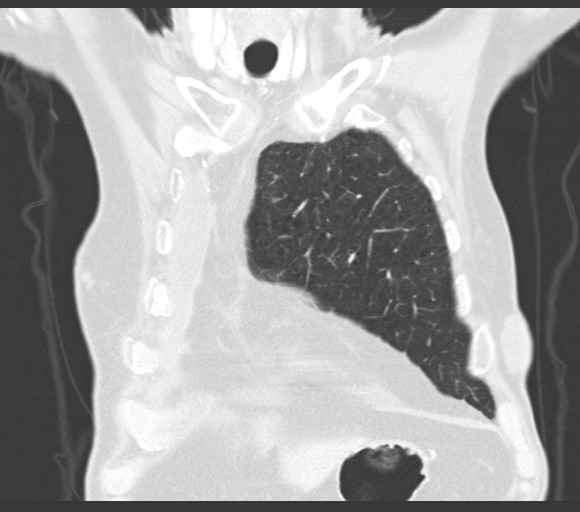
[im 35/88  lung]
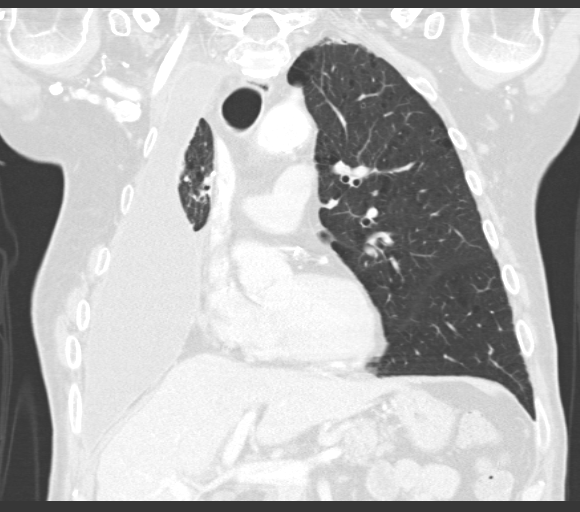
[im 53/88  lung]
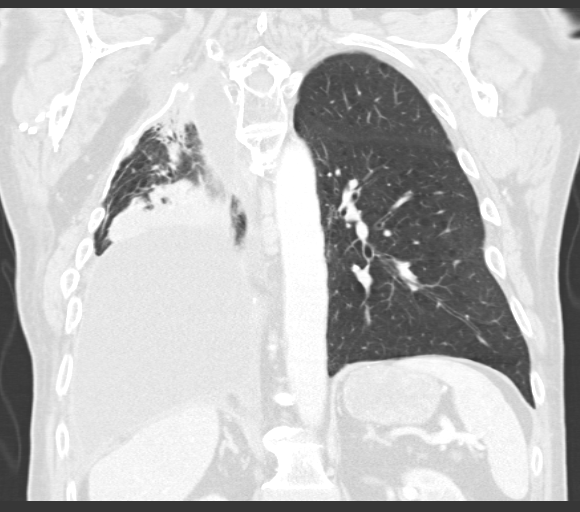

[15 of 36 positions shown; findings below may reference images not displayed]

FINDINGS: Mediastinum/Lymph Nodes: The previously noted mass/adenopathy in the
left superior mediastinum/thoracic inlet region currently measures
3.3 x 2.5 cm, essentially unchanged from recent prior study allowing
for slight differences in position. No new adenopathy is appreciable
on this study. Port-A-Cath tip is in the superior vena cava without
demonstrable pneumothorax. Thyroid appears normal. There is
atherosclerotic change in the aorta without aneurysm or dissection.
The visualized great vessels appear unremarkable. Note that the
right common and left common carotid arteries arise as a common
trunk. There is no major vessel pulmonary embolus appreciable.
Pericardium is minimally thickened, stable. There are scattered foci
of coronary artery calcification. Post radiation therapy change in
the right hilum is stable.

Lungs/Pleura: There is a sizable right pleural effusion with both
free and loculated components, stable. There is a subpulmonic
component to this effusion. Volume loss throughout much of the right
lung remains with a combination cicatrization and consolidation. A
well-defined mass is not appreciable in the right hemithorax. On the
left, there is again noted a 6 mm nodular opacity in the anterior
segment of the left upper lobe seen on axial slice 28 series 3. A 3
mm nodular opacity in the superior segment of the left lower lobe is
seen on axial slice 32 series 3. A 2 mm nodular opacity in the right
apex seen on axial slice 13 series 3 is stable. No new parenchymal
nodular opacities are noted. There is no consolidation or effusion
on the left. There is a degree of underlying emphysematous change
which is stable.

Upper abdomen: Visualized upper abdominal structures appear
unremarkable.

Musculoskeletal: There is degenerative change in the thoracic spine.
There are no blastic or lytic bone lesions. There is evidence of old
rib trauma on the left.
IMPRESSION: Mass/enlarged lymph node at the left thoracic inlet/superior
mediastinum is essentially stable. Small parenchymal lung nodules,
largest measuring 6 mm on the left, are unchanged. There is a degree
of underlying emphysema. There is extensive postoperative/ post
radiation therapy change on the right with volume loss and
consolidation as well as large effusion. These are stable changes.
No new lymph node prominence identified. Slight pericardial
thickening is stable. Overall no change from recent prior study.
